# Patient Record
Sex: Female | Born: 1955
Health system: Southern US, Community
[De-identification: ages and names within clinical notes are randomized; demographics above are authoritative.]

## PROBLEM LIST (undated history)

## (undated) DIAGNOSIS — F32A Depression, unspecified: Secondary | ICD-10-CM

## (undated) DIAGNOSIS — K222 Esophageal obstruction: Secondary | ICD-10-CM

## (undated) DIAGNOSIS — I10 Essential (primary) hypertension: Secondary | ICD-10-CM

## (undated) DIAGNOSIS — F419 Anxiety disorder, unspecified: Secondary | ICD-10-CM

## (undated) DIAGNOSIS — D649 Anemia, unspecified: Secondary | ICD-10-CM

## (undated) DIAGNOSIS — I209 Angina pectoris, unspecified: Secondary | ICD-10-CM

## (undated) DIAGNOSIS — F329 Major depressive disorder, single episode, unspecified: Secondary | ICD-10-CM

## (undated) DIAGNOSIS — U071 COVID-19: Secondary | ICD-10-CM

## (undated) DIAGNOSIS — M797 Fibromyalgia: Secondary | ICD-10-CM

## (undated) DIAGNOSIS — I499 Cardiac arrhythmia, unspecified: Secondary | ICD-10-CM

## (undated) DIAGNOSIS — R1110 Vomiting, unspecified: Secondary | ICD-10-CM

## (undated) HISTORY — DX: Anxiety disorder, unspecified: F41.9

## (undated) HISTORY — DX: Essential (primary) hypertension: I10

## (undated) HISTORY — DX: Major depressive disorder, single episode, unspecified: F32.9

## (undated) HISTORY — DX: Esophageal obstruction: K22.2

## (undated) HISTORY — PX: ABDOMINAL ADHESION SURGERY: SHX90

## (undated) HISTORY — PX: BUNIONECTOMY: SHX129

## (undated) HISTORY — DX: Depression, unspecified: F32.A

## (undated) HISTORY — PX: GASTRIC BYPASS: SHX52

## (undated) HISTORY — PX: CERVICAL FUSION: SHX112

## (undated) HISTORY — DX: COVID-19: U07.1

---
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
## 2003-12-24 ENCOUNTER — Encounter: Payer: Self-pay | Admitting: Orthopaedic Surgery

## 2004-01-13 ENCOUNTER — Encounter: Payer: Self-pay | Admitting: Orthopaedic Surgery

## 2004-01-13 ENCOUNTER — Ambulatory Visit: Payer: Self-pay | Admitting: Orthopaedic Surgery

## 2004-02-03 ENCOUNTER — Other Ambulatory Visit: Payer: Self-pay

## 2004-02-10 ENCOUNTER — Ambulatory Visit: Payer: Self-pay | Admitting: Orthopaedic Surgery

## 2004-02-12 ENCOUNTER — Encounter: Payer: Self-pay | Admitting: Orthopaedic Surgery

## 2004-02-23 ENCOUNTER — Encounter: Payer: Self-pay | Admitting: Orthopaedic Surgery

## 2004-03-14 ENCOUNTER — Encounter: Payer: Self-pay | Admitting: Orthopaedic Surgery

## 2004-04-14 ENCOUNTER — Encounter: Payer: Self-pay | Admitting: Orthopaedic Surgery

## 2004-05-12 ENCOUNTER — Encounter: Payer: Self-pay | Admitting: Orthopaedic Surgery

## 2004-06-12 ENCOUNTER — Encounter: Payer: Self-pay | Admitting: Orthopaedic Surgery

## 2004-07-12 ENCOUNTER — Ambulatory Visit: Payer: Self-pay | Admitting: Orthopaedic Surgery

## 2005-01-05 ENCOUNTER — Encounter: Payer: Self-pay | Admitting: Orthopedic Surgery

## 2005-01-12 ENCOUNTER — Encounter: Payer: Self-pay | Admitting: Orthopedic Surgery

## 2005-02-11 ENCOUNTER — Encounter: Payer: Self-pay | Admitting: Orthopedic Surgery

## 2005-11-29 ENCOUNTER — Encounter: Payer: Self-pay | Admitting: Orthopedic Surgery

## 2005-12-12 ENCOUNTER — Encounter: Payer: Self-pay | Admitting: Orthopedic Surgery

## 2006-01-12 ENCOUNTER — Encounter: Payer: Self-pay | Admitting: Orthopedic Surgery

## 2006-02-06 ENCOUNTER — Other Ambulatory Visit: Payer: Self-pay

## 2006-02-06 ENCOUNTER — Emergency Department: Payer: Self-pay | Admitting: Emergency Medicine

## 2006-08-23 ENCOUNTER — Ambulatory Visit: Payer: Self-pay | Admitting: Unknown Physician Specialty

## 2008-01-13 LAB — HM MAMMOGRAPHY

## 2008-07-24 ENCOUNTER — Ambulatory Visit: Payer: Self-pay | Admitting: Unknown Physician Specialty

## 2008-08-15 ENCOUNTER — Ambulatory Visit: Payer: Self-pay | Admitting: Family

## 2009-04-05 ENCOUNTER — Ambulatory Visit: Payer: Self-pay | Admitting: Internal Medicine

## 2009-09-05 LAB — HM COLONOSCOPY: HM Colonoscopy: NORMAL

## 2009-12-22 ENCOUNTER — Observation Stay: Payer: Self-pay | Admitting: Internal Medicine

## 2009-12-25 ENCOUNTER — Emergency Department: Payer: Self-pay | Admitting: Internal Medicine

## 2010-03-10 ENCOUNTER — Ambulatory Visit: Payer: Self-pay | Admitting: Family Medicine

## 2010-03-14 ENCOUNTER — Ambulatory Visit: Payer: Self-pay | Admitting: Family Medicine

## 2010-08-12 ENCOUNTER — Ambulatory Visit: Payer: Self-pay | Admitting: Surgery

## 2010-08-28 ENCOUNTER — Ambulatory Visit: Payer: Self-pay | Admitting: Unknown Physician Specialty

## 2010-08-31 LAB — PATHOLOGY REPORT

## 2011-05-03 LAB — LIPID PANEL
Cholesterol: 171 mg/dL (ref 0–200)
HDL: 39 mg/dL (ref 35–70)
LDL Cholesterol: 118 mg/dL
Triglycerides: 71 mg/dL (ref 40–160)

## 2011-05-03 LAB — HEMOGLOBIN A1C: Hgb A1c MFr Bld: 6 % (ref 4.0–6.0)

## 2011-05-06 ENCOUNTER — Encounter: Payer: Self-pay | Admitting: Internal Medicine

## 2011-05-10 ENCOUNTER — Ambulatory Visit (INDEPENDENT_AMBULATORY_CARE_PROVIDER_SITE_OTHER): Payer: 59 | Admitting: Internal Medicine

## 2011-05-10 ENCOUNTER — Encounter: Payer: Self-pay | Admitting: Internal Medicine

## 2011-05-10 DIAGNOSIS — Z9884 Bariatric surgery status: Secondary | ICD-10-CM

## 2011-05-10 DIAGNOSIS — E538 Deficiency of other specified B group vitamins: Secondary | ICD-10-CM

## 2011-05-10 DIAGNOSIS — I10 Essential (primary) hypertension: Secondary | ICD-10-CM

## 2011-05-10 DIAGNOSIS — R7303 Prediabetes: Secondary | ICD-10-CM | POA: Insufficient documentation

## 2011-05-10 DIAGNOSIS — E119 Type 2 diabetes mellitus without complications: Secondary | ICD-10-CM

## 2011-05-10 DIAGNOSIS — E559 Vitamin D deficiency, unspecified: Secondary | ICD-10-CM

## 2011-05-10 MED ORDER — TRAMADOL HCL 50 MG PO TABS
50.0000 mg | ORAL_TABLET | Freq: Four times a day (QID) | ORAL | Status: DC | PRN
Start: 2011-05-10 — End: 2012-07-10

## 2011-05-10 MED ORDER — ONDANSETRON HCL 4 MG PO TABS: 4.0000 mg | ORAL_TABLET | Freq: Three times a day (TID) | ORAL | Status: AC | PRN

## 2011-05-10 NOTE — Assessment & Plan Note (Addendum)
She has required fewer medications for management of hypertension since her bypass. She was previously taking his hydrochlorothiazide lisinopril metoprolol but was found to be quite bradycardic on her previous dose of metoprolol. For the last several days she has been only on reduced dose of metoprolol. The hydrochlorothiazide was stopped due to her propensity for dehydration due to gastric bypass. Since she does have a history of diabetes we discussed the possibility of resuming lisinopril stop the Toprol depending on whether she had mild microalbuminuria.

## 2011-05-10 NOTE — Assessment & Plan Note (Signed)
We spent 45 minutes today discussing her her recent surgery and her dietary needs. I have made recommendations regarding several protein rich foods that will also be low in carbohydrate and therefore not aggravate her abdominal distention and nausea. Also had some blood work done today to look for B12 and vitamin D deficiency as this often happens after gastric bypass.

## 2011-05-10 NOTE — Patient Instructions (Addendum)
Your diabetes is well controlled without medications.  We will repeat your cholesterol in 3 months and decide then if you still need cholesterol medication.  Continue metoprolol for now for blood pressure    You need to have ready access to foods that are protein rich,  Low carb, such as :  Atkins snack size chocolate lovers variety protein bars:  130 to 160 cla   6 carbs  12 g protein  Lots of fiber!  sargento cheese sticks   1 ounce,  100 cal  No carbs  All protein /fat   EAS Carb control   Protein shake  100 cal  2.5 carbs  17 g proteins 11 ounces

## 2011-05-10 NOTE — Progress Notes (Signed)
Subjective:    Patient ID: Francetta Found, female    DOB: 02/06/1956, 56 y.o.   MRN: [id number]  HPI Ms. Koors is a 56 year old white female with a history of asthma and obesity who presents for followup. She was last seen prior to my move in August. She recently underwent gastric bypass in dec by Dr. Alva Garnet. Prior to bypass her peak weight was 247 pounds and she is dropped 50 pounds thus far .  She has been plagued by recurrent  nausea and vomiting  of unclear etiology but does note that she has been unable to comply with instructions to avoid drinking water during her small meals. She had been using Zofran prescribed by Dr. Alva Garnet for management of nausea but recently had changed to a transdermal patch which caused extensive nausea and vomiting which lasted approximately 48 hours. She did not notify Dr. Alva Garnet of this reaction.  Additionally she has had trouble complying with them we'll schedule due to her symptoms of a full-time position she accepted to a full-time position with with the company she was hoping to get part-time work from.  She she admits to being  overwhelmed by the prospect of balancing her dietary needs with a full-time job. She has resorted to eating crackers at times not following her diet.   Past Medical History  Diagnosis Date  . Asthma   . Schatzki's ring     dialated 3 times by [facility]    Current Outpatient Prescriptions on File Prior to Visit  Medication Sig Dispense Refill  . sertraline (ZOLOFT) 100 MG tablet Take 100 mg by mouth daily.       Review of Systems  Constitutional: Negative for fever, chills and unexpected weight change.  HENT: Negative for hearing loss, ear pain, nosebleeds, congestion, sore throat, facial swelling, rhinorrhea, sneezing, mouth sores, trouble swallowing, neck pain, neck stiffness, voice change, postnasal drip, sinus pressure, tinnitus and ear discharge.   Eyes: Negative for pain, discharge, redness and visual disturbance.  Respiratory:  Negative for cough, chest tightness, shortness of breath, wheezing and stridor.   Cardiovascular: Negative for chest pain, palpitations and leg swelling.  Gastrointestinal: Positive for nausea and vomiting.  Musculoskeletal: Negative for myalgias and arthralgias.  Skin: Negative for color change and rash.  Neurological: Negative for dizziness, weakness, light-headedness and headaches.  Hematological: Negative for adenopathy.       Objective:   Physical Exam  Constitutional: She is oriented to person, place, and time. She appears well-developed and well-nourished.  HENT:  Mouth/Throat: Oropharynx is clear and moist.  Eyes: EOM are normal. Pupils are equal, round, and reactive to light. No scleral icterus.  Neck: Normal range of motion. Neck supple. No JVD present. No thyromegaly present.  Cardiovascular: Normal rate, regular rhythm, normal heart sounds and intact distal pulses.   Pulmonary/Chest: Effort normal and breath sounds normal.  Abdominal: Soft. Bowel sounds are normal. She exhibits no mass. There is no tenderness.  Musculoskeletal: Normal range of motion. She exhibits no edema.  Lymphadenopathy:    She has no cervical adenopathy.  Neurological: She is alert and oriented to person, place, and time.  Skin: Skin is warm and dry.  Psychiatric: She has a normal mood and affect.      Assessment & Plan:   Hypertension She has required fewer medications for management of hypertension since her bypass. She was previously taking his hydrochlorothiazide lisinopril metoprolol but was found to be quite bradycardic on her previous dose of metoprolol. For the last several  days she has been only on reduced dose of metoprolol. The hydrochlorothiazide was stopped due to her propensity for dehydration due to gastric bypass. Since she does have a history of diabetes we discussed the possibility of resuming lisinopril stop the Toprol depending on whether she had mild microalbuminuria.   Status  post gastric bypass for obesity We spent 45 minutes today discussing her her recent surgery and her dietary needs. I have made recommendations regarding several protein rich foods that will also be low in carbohydrate and therefore not aggravate her abdominal distention and nausea. Also had some blood work done today to look for B12 and vitamin D deficiency as this often happens after gastric bypass.  Diabetes mellitus, controlled Her hemoglobin A1c is now 6.0 on diet alone.    Updated Medication List Outpatient Encounter Prescriptions as of 05/10/2011  Medication Sig Dispense Refill  . calcium carbonate (OS-CAL) 600 MG TABS Take 600 mg by mouth 2 (two) times daily with a meal.      . lisinopril (PRINIVIL,ZESTRIL) 10 MG tablet Take 10 mg by mouth daily.      . metFORMIN (GLUCOPHAGE) 500 MG tablet Take 500 mg by mouth daily with breakfast.      . metoprolol succinate (TOPROL-XL) 50 MG 24 hr tablet Take 50 mg by mouth daily. Take with or immediately following a meal.      . montelukast (SINGULAIR) 10 MG tablet Take 10 mg by mouth at bedtime.      . Multiple Vitamin (MULTIVITAMIN) tablet Take 1 tablet by mouth daily. Bariatric Vitamin      . sertraline (ZOLOFT) 100 MG tablet Take 100 mg by mouth daily.      . traMADol (ULTRAM) 50 MG tablet Take 1 tablet (50 mg total) by mouth every 6 (six) hours as needed for pain.  90 tablet  3  . DISCONTD: traMADol (ULTRAM) 50 MG tablet Take 50 mg by mouth 2 (two) times daily.      . ondansetron (ZOFRAN) 4 MG tablet Take 1 tablet (4 mg total) by mouth every 8 (eight) hours as needed for nausea.  60 tablet  3  . DISCONTD: furosemide (LASIX) 40 MG tablet Take 40 mg by mouth daily.      Marland Kitchen DISCONTD: lansoprazole (PREVACID) 30 MG capsule Take 30 mg by mouth daily.      Marland Kitchen DISCONTD: metoprolol (LOPRESSOR) 50 MG tablet Take 50 mg by mouth 2 (two) times daily.      Marland Kitchen DISCONTD: verapamil (CALAN-SR) 240 MG CR tablet Take 240 mg by mouth daily.

## 2011-05-10 NOTE — Assessment & Plan Note (Signed)
Her hemoglobin A1c is now 6.0 on diet alone.

## 2011-05-11 LAB — VITAMIN B12: Vitamin B-12: >1500 pg/mL — ABNORMAL HIGH (ref 211–911)

## 2011-05-11 LAB — CBC WITH DIFFERENTIAL/PLATELET
Basophils Absolute: 0 10*3/uL (ref 0.0–0.1)
Basophils Relative: 0.6 % (ref 0.0–3.0)
Eosinophils Absolute: 0.2 10*3/uL (ref 0.0–0.7)
Eosinophils Relative: 4.4 % (ref 0.0–5.0)
HCT: 38.2 % (ref 36.0–46.0)
Hemoglobin: 12.3 g/dL (ref 12.0–15.0)
Lymphocytes Relative: 41.4 % (ref 12.0–46.0)
Lymphs Abs: 2.2 10*3/uL (ref 0.7–4.0)
MCHC: 32.1 g/dL (ref 30.0–36.0)
MCV: 83.8 fl (ref 78.0–100.0)
Monocytes Absolute: 0.2 10*3/uL (ref 0.1–1.0)
Monocytes Relative: 4.7 % (ref 3.0–12.0)
Neutro Abs: 2.6 10*3/uL (ref 1.4–7.7)
Neutrophils Relative %: 48.9 % (ref 43.0–77.0)
Platelets: 170 10*3/uL (ref 150.0–400.0)
RBC: 4.56 Mil/uL (ref 3.87–5.11)
RDW: 15.6 % — ABNORMAL HIGH (ref 11.5–14.6)
WBC: 5.3 10*3/uL (ref 4.5–10.5)

## 2011-05-11 LAB — MICROALBUMIN / CREATININE URINE RATIO
Creatinine,U: 267.2 mg/dL
Microalb Creat Ratio: 0.4 mg/g (ref 0.0–30.0)
Microalb, Ur: 1.1 mg/dL (ref 0.0–1.9)

## 2011-05-11 LAB — VITAMIN D 25 HYDROXY (VIT D DEFICIENCY, FRACTURES): Vit D, 25-Hydroxy: 50 ng/mL (ref 30–89)

## 2011-05-16 ENCOUNTER — Ambulatory Visit: Payer: Self-pay | Admitting: Internal Medicine

## 2011-06-03 ENCOUNTER — Encounter: Payer: Self-pay | Admitting: Internal Medicine

## 2011-07-22 ENCOUNTER — Ambulatory Visit: Payer: Self-pay | Admitting: Bariatrics

## 2011-07-25 ENCOUNTER — Ambulatory Visit: Payer: Self-pay | Admitting: Bariatrics

## 2011-07-29 ENCOUNTER — Other Ambulatory Visit: Payer: Self-pay | Admitting: Internal Medicine

## 2011-07-29 MED ORDER — METOPROLOL SUCCINATE ER 50 MG PO TB24: 50.0000 mg | ORAL_TABLET | Freq: Every day | ORAL | Status: DC

## 2011-08-13 HISTORY — PX: CHOLECYSTECTOMY: SHX55

## 2011-09-06 ENCOUNTER — Encounter: Payer: Self-pay | Admitting: Internal Medicine

## 2011-09-06 ENCOUNTER — Ambulatory Visit (INDEPENDENT_AMBULATORY_CARE_PROVIDER_SITE_OTHER): Payer: 59 | Admitting: Internal Medicine

## 2011-09-06 VITALS — BP 122/68 | HR 64 | Temp 98.4°F | Resp 16 | Wt 167.5 lb

## 2011-09-06 DIAGNOSIS — K828 Other specified diseases of gallbladder: Secondary | ICD-10-CM

## 2011-09-06 DIAGNOSIS — Z9884 Bariatric surgery status: Secondary | ICD-10-CM

## 2011-09-06 DIAGNOSIS — E119 Type 2 diabetes mellitus without complications: Secondary | ICD-10-CM

## 2011-09-06 NOTE — Progress Notes (Signed)
Patient ID: Sherri Lee, female   DOB: 1955-04-23, 56 y.o.   MRN: [id number]  Patient Active Problem List  Diagnosis  . Hypertension  . Diabetes mellitus, controlled  . Status post gastric bypass for obesity  . Biliary dyskinesia    Subjective:  CC:   Chief Complaint  Patient presents with  . Advice Only    upcoming surgery, wants opinion    HPI:   Sherri Briggsis a 56 y.o. female who presents for 4 month follow up. Has lost 30 lbs since February , now 75 lbs lighter. Tyner did the  Rou en Y in Dec at [facility]  Had a surgery in April at [facility] following an endoscopy which showed bile in the stomach,  To relieve adhesions (?) Still having a lot of nausea and now having RUQ pain radiating to back since December .  Talked to bariatric surgeon , who did an ultrasound was negative for gallstones.  HIDA scan was overactive and is suggesting a cholecystectomy dure to biliary dyskinesia.  The pain is a dull ache , aggravated by the end of the day .  Persistent bloating and nausea.  Has been constipated since the surgery.  Drinking protein drinks,  decaff green tea, and yogurt.  Gets nauseated after a few  Bites .  Has resumed Jinteli , oral HRT ffor severe hot flashes,  By GYN.  Her PAP scheduled for July    Past Medical History  Diagnosis Date  . Asthma   . Schatzki's ring     dialated 3 times by Markham Jordan     Past Surgical History  Procedure Date  . Bunionectomy     left foot   . Cesarean section          The following portions of the patient's history were reviewed and updated as appropriate: Allergies, current medications, and problem list.    Review of Systems:   12 Pt  review of systems was negative except those addressed in the HPI,     History   Social History  . Marital Status: Married    Spouse Name: N/A    Number of Children: N/A  . Years of Education: N/A   Occupational History  . Not on file.   Social History Main Topics  . Smoking  status: Never Smoker   . Smokeless tobacco: Never Used  . Alcohol Use: No  . Drug Use: No  . Sexually Active: Not on file   Other Topics Concern  . Not on file   Social History Narrative   Lives with spouse, son, and stepson who has serious psychiatric issues and alcohol abuse    Objective:  BP 122/68  Pulse 64  Temp 98.4 F (36.9 C) (Oral)  Resp 16  Wt 167 lb 8 oz (75.978 kg)  SpO2 97%  General appearance: alert, cooperative and appears stated age Ears: normal TM's and external ear canals both ears Throat: lips, mucosa, and tongue normal; teeth and gums normal Neck: no adenopathy, no carotid bruit, supple, symmetrical, trachea midline and thyroid not enlarged, symmetric, no tenderness/mass/nodules Back: symmetric, no curvature. ROM normal. No CVA tenderness. Lungs: clear to auscultation bilaterally Heart: regular rate and rhythm, S1, S2 normal, no murmur, click, rub or gallop Abdomen: soft, non-tender; bowel sounds normal; no masses,  no organomegaly Pulses: 2+ and symmetric Skin: Skin color, texture, turgor normal. No rashes or lesions Lymph nodes: Cervical, supraclavicular, and axillary nodes normal.  Assessment and Plan:  Status post gastric bypass  for obesity She has lost 75 lb thus far and has not plateaued yet due to persistent nausea ad early satiety  Biliary dyskinesia She is undergoing cholecystectomy this week for persistent nausea due to biliary dyskinesia.    Diabetes mellitus, controlled Now diet controlled since she has lost weight    Updated Medication List Outpatient Encounter Prescriptions as of 09/06/2011  Medication Sig Dispense Refill  . calcium carbonate (OS-CAL) 600 MG TABS Take 600 mg by mouth 2 (two) times daily with a meal.      . metoprolol succinate (TOPROL-XL) 50 MG 24 hr tablet Take 1 tablet (50 mg total) by mouth daily. Take with or immediately following a meal.  30 tablet  3  . montelukast (SINGULAIR) 10 MG tablet Take 10 mg by mouth  at bedtime.      . Multiple Vitamin (MULTIVITAMIN) tablet Take 1 tablet by mouth daily. Bariatric Vitamin      . sertraline (ZOLOFT) 100 MG tablet Take 100 mg by mouth daily.      . traMADol (ULTRAM) 50 MG tablet Take 1 tablet (50 mg total) by mouth every 6 (six) hours as needed for pain.  90 tablet  3  . DISCONTD: lisinopril (PRINIVIL,ZESTRIL) 10 MG tablet Take 10 mg by mouth daily.      Marland Kitchen DISCONTD: metFORMIN (GLUCOPHAGE) 500 MG tablet Take 500 mg by mouth daily with breakfast.         Orders Placed This Encounter  Procedures  . HM COLONOSCOPY    No Follow-up on file.

## 2011-09-08 ENCOUNTER — Encounter: Payer: Self-pay | Admitting: Internal Medicine

## 2011-09-08 DIAGNOSIS — K828 Other specified diseases of gallbladder: Secondary | ICD-10-CM | POA: Insufficient documentation

## 2011-09-08 NOTE — Assessment & Plan Note (Signed)
Now diet controlled since she has lost weight

## 2011-09-08 NOTE — Assessment & Plan Note (Signed)
She is undergoing cholecystectomy this week for persistent nausea due to biliary dyskinesia.

## 2011-09-08 NOTE — Assessment & Plan Note (Signed)
She has lost 75 lb thus far and has not plateaued yet due to persistent nausea ad early satiety

## 2011-09-27 ENCOUNTER — Telehealth: Payer: Self-pay | Admitting: Internal Medicine

## 2011-09-27 NOTE — Telephone Encounter (Signed)
Patient stopped by this afternoon to let us know that she is still having pain in her right shoulder blade area.  She states that her gallbladder was removed about 3 weeks ago.  She states that you have prescribed her tramadol in the past however that isn't helping the pain.  She wanted to know if she could take a higher dose of it or can you suggest something else for her to try.  She states that she is off the pain medication that the surgeon gave her for gallbladder surgery.

## 2011-09-28 MED ORDER — HYDROCODONE-ACETAMINOPHEN 5-500 MG PO TABS: 1.0000 | ORAL_TABLET | Freq: Four times a day (QID) | ORAL | Status: AC | PRN

## 2011-09-28 NOTE — Telephone Encounter (Signed)
If she still has the narcotics the surgeon gave her I would use that.  We need to figure out what her pain is coming from as well as treat it.  If she does not have any left,  Call in vicodin 7/5/500 on etablet every 6 hours prn pain #60 and have her make another appt with either him or me to work up the shoulder blade pain,

## 2011-09-28 NOTE — Telephone Encounter (Signed)
Patient notified. Rx called in. Appt made.

## 2011-10-04 ENCOUNTER — Ambulatory Visit: Payer: 59 | Admitting: Internal Medicine

## 2011-10-04 ENCOUNTER — Ambulatory Visit (INDEPENDENT_AMBULATORY_CARE_PROVIDER_SITE_OTHER): Payer: 59 | Admitting: Internal Medicine

## 2011-10-04 ENCOUNTER — Encounter: Payer: Self-pay | Admitting: Internal Medicine

## 2011-10-04 VITALS — BP 146/80 | HR 66 | Temp 98.1°F | Resp 16 | Wt 160.5 lb

## 2011-10-04 DIAGNOSIS — J019 Acute sinusitis, unspecified: Secondary | ICD-10-CM | POA: Insufficient documentation

## 2011-10-04 DIAGNOSIS — R071 Chest pain on breathing: Secondary | ICD-10-CM

## 2011-10-04 DIAGNOSIS — R0789 Other chest pain: Secondary | ICD-10-CM

## 2011-10-04 DIAGNOSIS — M546 Pain in thoracic spine: Secondary | ICD-10-CM

## 2011-10-04 DIAGNOSIS — M47814 Spondylosis without myelopathy or radiculopathy, thoracic region: Secondary | ICD-10-CM | POA: Insufficient documentation

## 2011-10-04 DIAGNOSIS — Z9884 Bariatric surgery status: Secondary | ICD-10-CM

## 2011-10-04 DIAGNOSIS — K828 Other specified diseases of gallbladder: Secondary | ICD-10-CM

## 2011-10-04 MED ORDER — HYDROCOD POLST-CHLORPHEN POLST 10-8 MG/5ML PO LQCR: 5.0000 mL | Freq: Two times a day (BID) | ORAL | Status: DC | PRN

## 2011-10-04 MED ORDER — AMOXICILLIN-POT CLAVULANATE 875-125 MG PO TABS: 1.0000 | ORAL_TABLET | Freq: Two times a day (BID) | ORAL | Status: AC

## 2011-10-04 NOTE — Assessment & Plan Note (Signed)
She continues to lose weight steadily after gastric bypass. Her BMI is now 26. Recent electrolytes and other labs from Dr. Alva Garnet were normal.

## 2011-10-04 NOTE — Assessment & Plan Note (Signed)
With chronic nausea, now relieved status post cholecystectomy.

## 2011-10-04 NOTE — Assessment & Plan Note (Signed)
Given chronicity of symptoms, development of facial pain and exam consistent with bacterial URI,  Will treat with empiric antibiotics, decongestants, and saline lavage.   

## 2011-10-04 NOTE — Assessment & Plan Note (Signed)
The etiology of her pain may be due to degenerative thoracic spine disease, given her history of cervical spine disease requiring surgery. Muscle concerned about an occult tumor given the persistent pain and worsening over his last several months. I ordered thoracic spine films and a plain chest x-ray for initial evaluation. She will likely need an MRI for definitive evaluation.

## 2011-10-04 NOTE — Patient Instructions (Addendum)
You have a sinus/ear infection   .  I am prescribing an antibiotic augmentin (take with food, and eat yogurt).   I also advise use of the following OTC meds to help with your other symptoms.   Take generic OTC benadryl 25 mg every 8 hours for the drainage,  Sudafed PE  10 to 30 mg every 8 hours for the congestion, you may substitute Afrin nasal spray for the nighttime dose of sudafed PE  If needed to prevent insomnia.  flushes your sinuses twice daily with Simply Saline (do over the sink because if you do it right you will spit out globs of mucus)  Tussionex for cough/pain (Narcotic)  Gargle with salt water as needed for sore throat.   X rays when you feel better

## 2011-10-04 NOTE — Progress Notes (Signed)
Patient ID: Francetta Found, female   DOB: [birth year], 56 y.o.   MRN: [id number]   Patient Active Problem List  Diagnosis  . Hypertension  . Diabetes mellitus, controlled  . Status post gastric bypass for obesity  . Biliary dyskinesia  . Thoracic spine pain  . Sinusitis acute    Subjective:  CC:   Chief Complaint  Patient presents with  . Sinusitis  . Headache    HPI:   [name] Briggsis a 56 y.o. female who presents with  1) symptoms of sinus pain ear pain productive cough and headache for the past 5 days .  Has been using over-the-counter NyQuil with no relief. Feels absolutely lousy. No significant improvement in 5 days.  2)  Persistent  right shoulder blade pain  Which radiates to  mid axillary line and has been present  for over 6 months.  The pain is daily, and he worsens by the end of the day.  It did not resolve with recent cholecystectomy. It predates her gastric bypass.  It is not relieved with ibuprofen Tylenol or tramadol. It is aggravated with upright position and only slightly improved when she  lies flat and presses her  back to the mattress or  floor.  She has a history of cervical and lumbar DDD with prior surgery on cervical spine.  Her lumbar back pain improved when she lost a significant amount of weight.  ,   Past Medical History  Diagnosis Date  . Asthma   . Schatzki's ring     dialated 3 times by Markham Jordan     Past Surgical History  Procedure Date  . Bunionectomy     left foot   . Cesarean section   . Cholecystectomy June 2013         The following portions of the patient's history were reviewed and updated as appropriate: Allergies, current medications, and problem list.    Review of Systems:   12 Pt  review of systems was negative except those addressed in the HPI,     History   Social History  . Marital Status: Married    Spouse Name: N/A    Number of Children: N/A  . Years of Education: N/A   Occupational History  . Not on file.     Social History Main Topics  . Smoking status: Never Smoker   . Smokeless tobacco: Never Used  . Alcohol Use: No  . Drug Use: No  . Sexually Active: Not on file   Other Topics Concern  . Not on file   Social History Narrative   Lives with spouse, son, and stepson who has serious psychiatric issues and alcohol abuse    Objective:  BP 146/80  Pulse 66  Temp 98.1 F (36.7 C) (Oral)  Resp 16  Wt 160 lb 8 oz (72.802 kg)  SpO2 97%  General appearance: alert, cooperative and appears stated age Ears: bilateral TM injection with effusions Throat: lips, mucosa, and tongue normal; teeth and gums normal. Tonsillar erythema without edema Neck: no adenopathy, no carotid bruit, supple, symmetrical, trachea midline and thyroid not enlarged, symmetric, no tenderness/mass/nodules Back: symmetric, no curvature. ROM normal. Tender to palpation right scapular region. Lungs: clear to auscultation bilaterally Heart: regular rate and rhythm, S1, S2 normal, no murmur, click, rub or gallop Abdomen: soft, non-tender; bowel sounds normal; no masses,  no organomegaly Pulses: 2+ and symmetric Skin: Skin color, texture, turgor normal. No rashes or lesions Lymph nodes: Cervical, supraclavicular, and axillary nodes normal.  Assessment and Plan:  Sinusitis acute Given chronicity of symptoms, development of facial pain and exam consistent with bacterial URI,  Will treat with empiric antibiotics, decongestants, and saline lavage.    Thoracic spine pain The etiology of her pain may be due to degenerative thoracic spine disease, given her history of cervical spine disease requiring surgery. Muscle concerned about an occult tumor given the persistent pain and worsening over his last several months. I ordered thoracic spine films and a plain chest x-ray for initial evaluation. She will likely need an MRI for definitive evaluation.  Status post gastric bypass for obesity She continues to lose weight steadily  after gastric bypass. Her BMI is now 26. Recent electrolytes and other labs from Dr. Alva Garnet were normal.  Biliary dyskinesia With chronic nausea, now relieved status post cholecystectomy.   Updated Medication List Outpatient Encounter Prescriptions as of 10/04/2011  Medication Sig Dispense Refill  . calcium carbonate (OS-CAL) 600 MG TABS Take 600 mg by mouth 2 (two) times daily with a meal.      . estradiol-norethindrone (COMBIPATCH) 0.05-0.14 MG/DAY Place 1 patch onto the skin 2 (two) times a week.      Marland Kitchen HYDROcodone-acetaminophen (VICODIN) 5-500 MG per tablet Take 1 tablet by mouth every 6 (six) hours as needed for pain.  60 tablet  0  . metoprolol succinate (TOPROL-XL) 50 MG 24 hr tablet Take 1 tablet (50 mg total) by mouth daily. Take with or immediately following a meal.  30 tablet  3  . montelukast (SINGULAIR) 10 MG tablet Take 10 mg by mouth at bedtime.      . Multiple Vitamin (MULTIVITAMIN) tablet Take 1 tablet by mouth daily. Bariatric Vitamin      . sertraline (ZOLOFT) 100 MG tablet Take 100 mg by mouth daily.      . traMADol (ULTRAM) 50 MG tablet Take 1 tablet (50 mg total) by mouth every 6 (six) hours as needed for pain.  90 tablet  3  . amoxicillin-clavulanate (AUGMENTIN) 875-125 MG per tablet Take 1 tablet by mouth 2 (two) times daily.  14 tablet  0  . chlorpheniramine-HYDROcodone (TUSSIONEX) 10-8 MG/5ML LQCR Take 5 mLs by mouth every 12 (twelve) hours as needed.  240 mL  1     Orders Placed This Encounter  Procedures  . DG Thoracic Spine W/Swimmers  . DG Chest 2 View    No Follow-up on file.

## 2011-10-04 NOTE — Progress Notes (Signed)
Rx for Tussionex faxed to [facility].

## 2011-10-14 ENCOUNTER — Ambulatory Visit (INDEPENDENT_AMBULATORY_CARE_PROVIDER_SITE_OTHER)
Admission: RE | Admit: 2011-10-14 | Discharge: 2011-10-14 | Disposition: A | Discharge status: Home / Self Care | Payer: 59 | Source: Ambulatory Visit | Attending: Internal Medicine | Admitting: Internal Medicine

## 2011-10-14 DIAGNOSIS — M546 Pain in thoracic spine: Secondary | ICD-10-CM

## 2011-10-14 DIAGNOSIS — R071 Chest pain on breathing: Secondary | ICD-10-CM

## 2011-10-14 DIAGNOSIS — R0789 Other chest pain: Secondary | ICD-10-CM

## 2011-10-19 ENCOUNTER — Telehealth: Payer: Self-pay | Admitting: Internal Medicine

## 2011-10-19 DIAGNOSIS — M546 Pain in thoracic spine: Secondary | ICD-10-CM

## 2011-10-19 NOTE — Telephone Encounter (Signed)
Patient notified that she will hear from referral coordinator with appt

## 2011-10-19 NOTE — Telephone Encounter (Signed)
Patient called back about her spine films and wants to proceed with physical therapy before getting an MRI.  Please advise.

## 2011-10-19 NOTE — Telephone Encounter (Signed)
PT referral made in epic

## 2011-11-08 ENCOUNTER — Encounter: Payer: Self-pay | Admitting: Internal Medicine

## 2011-11-13 ENCOUNTER — Encounter: Payer: Self-pay | Admitting: Internal Medicine

## 2011-11-21 ENCOUNTER — Other Ambulatory Visit: Payer: Self-pay | Admitting: Internal Medicine

## 2011-11-21 MED ORDER — METOPROLOL SUCCINATE ER 50 MG PO TB24: 50.0000 mg | ORAL_TABLET | Freq: Every day | ORAL | Status: DC

## 2011-11-28 ENCOUNTER — Ambulatory Visit: Payer: Self-pay | Admitting: Bariatrics

## 2011-12-05 ENCOUNTER — Telehealth: Payer: Self-pay | Admitting: Internal Medicine

## 2011-12-05 MED ORDER — HYDROCODONE-ACETAMINOPHEN 5-500 MG PO TABS: 1.0000 | ORAL_TABLET | Freq: Four times a day (QID) | ORAL | Status: DC | PRN

## 2011-12-05 NOTE — Telephone Encounter (Signed)
Ok to refill  Vicodin quantity #60 with no refills. A prescription has been printed

## 2011-12-05 NOTE — Telephone Encounter (Signed)
OK to refill vicodin 5-500 1 Q6 PRN? Last filled 09/28/11.

## 2011-12-05 NOTE — Telephone Encounter (Signed)
Patient needing a refill on her hydrocodone acetaminophen 5-500 mg.

## 2011-12-06 ENCOUNTER — Other Ambulatory Visit: Payer: Self-pay | Admitting: Bariatrics

## 2011-12-06 LAB — CLOSTRIDIUM DIFFICILE BY PCR

## 2011-12-06 NOTE — Telephone Encounter (Signed)
[facility] called [facility] to [facility]. 

## 2011-12-13 ENCOUNTER — Encounter: Payer: Self-pay | Admitting: Internal Medicine

## 2012-01-09 ENCOUNTER — Other Ambulatory Visit: Payer: Self-pay

## 2012-01-09 MED ORDER — HYDROCODONE-ACETAMINOPHEN 5-500 MG PO TABS: 1.0000 | ORAL_TABLET | Freq: Four times a day (QID) | ORAL | Status: DC | PRN

## 2012-01-09 NOTE — Telephone Encounter (Signed)
Patient states that she is going to physical therapy she is currently having constant pain due to her work and she wants to know if she can get a refill on her pain medication.

## 2012-01-10 ENCOUNTER — Other Ambulatory Visit: Payer: Self-pay

## 2012-01-13 ENCOUNTER — Encounter: Payer: Self-pay | Admitting: Internal Medicine

## 2012-04-16 ENCOUNTER — Other Ambulatory Visit: Payer: Self-pay | Admitting: Internal Medicine

## 2012-04-28 ENCOUNTER — Other Ambulatory Visit: Payer: Self-pay | Admitting: Internal Medicine

## 2012-04-28 NOTE — Telephone Encounter (Signed)
Ok to refill her vicodin for #90 no refills

## 2012-04-30 NOTE — Telephone Encounter (Signed)
Med phoned in °

## 2012-06-21 ENCOUNTER — Telehealth: Payer: Self-pay | Admitting: Internal Medicine

## 2012-06-21 NOTE — Telephone Encounter (Signed)
Pt states she left a vm a few days ago and had not heard back.  Pt states she is needing some Rx:  metoprolol has one refill left. States Dr. Darrick Huntsman had given her some pain medicine.  Goes to [facility] and is going to PT again.  Dr. There put her on Tramadol but it does not work as well as hydrocodone that Dr. Darrick Huntsman had given her before.  Wants Dr. Darrick Huntsman to call her in an Rx for the hydrocodone if possible.  Pt is starting new job on Monday and will not be able to come in after next week for a while.

## 2012-06-22 MED ORDER — METOPROLOL SUCCINATE ER 50 MG PO TB24: 50.0000 mg | ORAL_TABLET | Freq: Every day | ORAL | Status: DC

## 2012-06-22 NOTE — Telephone Encounter (Signed)
Script sent to pharmacy and patient notified as instructed by phone.

## 2012-06-22 NOTE — Telephone Encounter (Signed)
Patient not seen since 7/13 no labs since 6/13 states has one refill on metoprolol and requesting a pain medication. Please advise.

## 2012-06-22 NOTE — Telephone Encounter (Signed)
Ok to refill metoprolol for 30 days but must be seen for any refills on narcotics.

## 2012-07-10 ENCOUNTER — Ambulatory Visit (INDEPENDENT_AMBULATORY_CARE_PROVIDER_SITE_OTHER): Payer: 59 | Admitting: Internal Medicine

## 2012-07-10 ENCOUNTER — Encounter: Payer: Self-pay | Admitting: Internal Medicine

## 2012-07-10 VITALS — BP 120/78 | HR 76 | Temp 97.7°F | Resp 16 | Wt 147.5 lb

## 2012-07-10 DIAGNOSIS — M542 Cervicalgia: Secondary | ICD-10-CM

## 2012-07-10 DIAGNOSIS — I10 Essential (primary) hypertension: Secondary | ICD-10-CM

## 2012-07-10 DIAGNOSIS — L639 Alopecia areata, unspecified: Secondary | ICD-10-CM

## 2012-07-10 MED ORDER — SPIRONOLACTONE 12.5 MG HALF TABLET: 12.5000 mg | ORAL_TABLET | Freq: Every day | ORAL | Status: DC

## 2012-07-10 MED ORDER — GABAPENTIN 100 MG PO CAPS: 100.0000 mg | ORAL_CAPSULE | Freq: Three times a day (TID) | ORAL | Status: DC

## 2012-07-10 MED ORDER — HYDROCODONE-ACETAMINOPHEN 5-500 MG PO TABS: ORAL_TABLET | ORAL | Status: DC

## 2012-07-10 MED ORDER — TRAMADOL HCL 50 MG PO TABS: 50.0000 mg | ORAL_TABLET | Freq: Four times a day (QID) | ORAL | Status: DC | PRN

## 2012-07-10 NOTE — Patient Instructions (Addendum)
E will request records from [facility] and dr Macie Burows at [facility]   [facility]  use tramadol up to 4 daily,  civodin up to 2 daily,  Gabapentin up to 3 daily (or all at night)   tiral of spironolactone 12. 5 mg daily to suppress androgens (testosterone) to help your hair loss

## 2012-07-10 NOTE — Progress Notes (Signed)
Patient ID: Sherri Lee, female   DOB: [birth year], 57 y.o.   MRN: [id number]   Patient Active Problem List   Diagnosis Date Noted  . Alopecia areata 07/11/2012  . Cervicalgia 07/11/2012  . Thoracic spine pain 10/04/2011  . Sinusitis acute 10/04/2011  . Biliary dyskinesia 09/08/2011  . Hypertension 05/10/2011  . Diabetes mellitus, controlled 05/10/2011  . Status post gastric bypass for obesity 05/10/2011    Subjective:  CC:   Chief Complaint  Patient presents with  . Follow-up    HPI:   Sherri Briggsis a 57 y.o. female who presents 1) history of bariatric surgery.   Surgery was 1.5 years ago and she has been maintaining her weight;   labs done 6 weeks ago at the [facility]  (told it  was perfect). But has had significant hair loss. Started taking folic acid and biotin  In December .  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  She has been wearing wigs.because of the amount of hair lost.  Stressors:  2 sons who are having difficulty  Both financial and otherwise,    2) still getting PT at [facility].  For thoracic back pain.  Saw [place] at [facility] and referred to spine specialist.  History of rotator cuff surgery on the left and 3 disc fusion  And she is now having weakness and tremor of the left hand .  Was given tramadol which did not work  Wants to return to gabapentin    Past Medical History  Diagnosis Date  . Asthma   . Schatzki's ring     dialated 3 times by Markham Jordan     Past Surgical History  Procedure Laterality Date  . Bunionectomy      left foot   . Cesarean section    . Cholecystectomy  June 2013       The following portions of the patient's history were reviewed and updated as appropriate: Allergies, current medications, and problem list.    Review of Systems:   12 Pt  review of systems was negative except those addressed in the HPI,     History   Social History  . Marital Status: Married    Spouse Name: N/A    Number of Children: N/A  . Years of  Education: N/A   Occupational History  . Not on file.   Social History Main Topics  . Smoking status: Never Smoker   . Smokeless tobacco: Never Used  . Alcohol Use: No  . Drug Use: No  . Sexually Active: Not on file   Other Topics Concern  . Not on file   Social History Narrative   Lives with spouse, son, and stepson who has serious psychiatric issues and alcohol abuse    Objective:  BP 120/78  Pulse 76  Temp(Src) 97.7 F (36.5 C) (Oral)  Resp 16  Wt 147 lb 8 oz (66.906 kg)  BMI 24.16 kg/m2  General appearance: alert, cooperative and appears stated age Ears: normal TM's and external ear canals both ears Throat: lips, mucosa, and tongue normal; teeth and gums normal Neck: no adenopathy, no carotid bruit, supple, symmetrical, trachea midline and thyroid not enlarged, symmetric, no tenderness/mass/nodules Back: symmetric, no curvature. ROM normal. No CVA tenderness. Lungs: clear to auscultation bilaterally Heart: regular rate and rhythm, S1, S2 normal, no murmur, click, rub or gallop Abdomen: soft, non-tender; bowel sounds normal; no masses,  no organomegaly Pulses: 2+ and symmetric Skin: Skin color, texture, turgor normal. No rashes or lesions Lymph nodes:  Cervical, supraclavicular, and axillary nodes normal.  Assessment and Plan:  Alopecia areata Secondary to stress and iatrogenic malnutrition from bypass.  Will review her labs,  Recommend biotin and add spironolactone.   Hypertension Well controlled on current regimen. Renal function stable, no changes today.  Cervicalgia Recurrent with right shoulder and arm pain.  She has had prior Cervical spine surgery and has seen neurosurgery. .  Continue tramadol for daytime relief of pain and vicodin for nocturnal pain.    Updated Medication List Outpatient Encounter Prescriptions as of 07/10/2012  Medication Sig Dispense Refill  . Biotin 1000 MCG tablet Take 1,000 mcg by mouth 3 (three) times daily.      . calcium  carbonate (OS-CAL) 600 MG TABS Take 600 mg by mouth 2 (two) times daily with a meal.      . folic acid (FOLVITE) 1 MG tablet Take 1 mg by mouth daily.      . metoprolol succinate (TOPROL-XL) 50 MG 24 hr tablet Take 1 tablet (50 mg total) by mouth daily. Take with or immediately following a meal.  30 tablet  0  . montelukast (SINGULAIR) 10 MG tablet Take 10 mg by mouth at bedtime.      . Multiple Vitamin (MULTIVITAMIN) tablet Take 1 tablet by mouth daily. Bariatric Vitamin      . sertraline (ZOLOFT) 100 MG tablet Take 100 mg by mouth daily.      . traMADol (ULTRAM) 50 MG tablet Take 1 tablet (50 mg total) by mouth every 6 (six) hours as needed for pain.  120 tablet  3  . vitamin B-12 (CYANOCOBALAMIN) 1000 MCG tablet Take 3,000 mcg by mouth daily.      . [DISCONTINUED] traMADol (ULTRAM) 50 MG tablet Take 1 tablet (50 mg total) by mouth every 6 (six) hours as needed for pain.  90 tablet  3  . chlorpheniramine-HYDROcodone (TUSSIONEX) 10-8 MG/5ML LQCR Take 5 mLs by mouth every 12 (twelve) hours as needed.  240 mL  1  . estradiol-norethindrone (COMBIPATCH) 0.05-0.14 MG/DAY Place 1 patch onto the skin 2 (two) times a week.      . gabapentin (NEURONTIN) 100 MG capsule Take 1 capsule (100 mg total) by mouth 3 (three) times daily.  90 capsule  3  . HYDROcodone-acetaminophen (VICODIN) 5-500 MG per tablet TAKE 1 TABLET BY MOUTH EVERY 6 HOURS AS NEEDED FOR PAIN  120 tablet  3  . spironolactone (ALDACTONE) 12.5 mg TABS Take 0.5 tablets (12.5 mg total) by mouth daily.  30 tablet  6  . [DISCONTINUED] HYDROcodone-acetaminophen (VICODIN) 5-500 MG per tablet TAKE 1 TABLET BY MOUTH EVERY 6 HOURS AS NEEDED FOR PAIN  90 tablet  0   No facility-administered encounter medications on file as of 07/10/2012.     No orders of the defined types were placed in this encounter.    No Follow-up on file.

## 2012-07-11 DIAGNOSIS — M542 Cervicalgia: Secondary | ICD-10-CM | POA: Insufficient documentation

## 2012-07-11 DIAGNOSIS — L639 Alopecia areata, unspecified: Secondary | ICD-10-CM | POA: Insufficient documentation

## 2012-07-11 NOTE — Assessment & Plan Note (Signed)
Well controlled on current regimen. Renal function stable, no changes today. 

## 2012-07-11 NOTE — Assessment & Plan Note (Signed)
Secondary to stress and iatrogenic malnutrition from bypass.  Will review her labs,  Recommend biotin and add spironolactone.

## 2012-07-11 NOTE — Assessment & Plan Note (Signed)
Recurrent with right shoulder and arm pain.  She has had prior Cervical spine surgery and has seen neurosurgery. .  Continue tramadol for daytime relief of pain and vicodin for nocturnal pain.

## 2012-08-08 ENCOUNTER — Telehealth: Payer: Self-pay | Admitting: *Deleted

## 2012-08-08 NOTE — Telephone Encounter (Signed)
Spoke with pt, discharged from [facility] 08/07/12 with abdominal pain. Pt states pain is improving, doctor felt it was related to Lotronex which she has discontinued Follow up visit scheduled with Dr. Darrick Huntsman 08/21/12. Advised pt to call if symptoms worsen or change prior to appointment.

## 2012-08-09 ENCOUNTER — Encounter: Payer: Self-pay | Admitting: Internal Medicine

## 2012-08-09 DIAGNOSIS — N83209 Unspecified ovarian cyst, unspecified side: Secondary | ICD-10-CM

## 2012-08-09 DIAGNOSIS — N83202 Unspecified ovarian cyst, left side: Secondary | ICD-10-CM | POA: Insufficient documentation

## 2012-08-21 ENCOUNTER — Encounter: Payer: Self-pay | Admitting: Internal Medicine

## 2012-08-21 ENCOUNTER — Ambulatory Visit (INDEPENDENT_AMBULATORY_CARE_PROVIDER_SITE_OTHER): Payer: 59 | Admitting: Internal Medicine

## 2012-08-21 VITALS — BP 138/72 | HR 72 | Temp 98.3°F | Resp 14 | Wt 145.5 lb

## 2012-08-21 DIAGNOSIS — N83209 Unspecified ovarian cyst, unspecified side: Secondary | ICD-10-CM

## 2012-08-21 DIAGNOSIS — R5383 Other fatigue: Secondary | ICD-10-CM

## 2012-08-21 DIAGNOSIS — E119 Type 2 diabetes mellitus without complications: Secondary | ICD-10-CM

## 2012-08-21 DIAGNOSIS — K915 Postcholecystectomy syndrome: Secondary | ICD-10-CM

## 2012-08-21 DIAGNOSIS — Z9884 Bariatric surgery status: Secondary | ICD-10-CM

## 2012-08-21 DIAGNOSIS — R5381 Other malaise: Secondary | ICD-10-CM

## 2012-08-21 NOTE — Progress Notes (Signed)
Patient ID: Francetta Found, female   DOB: 1956/01/16, 57 y.o.   MRN: [id number]  Patient Active Problem List   Diagnosis Date Noted  . Post-cholecystectomy syndrome 08/22/2012  . Other and unspecified ovarian cyst 08/09/2012  . Alopecia areata 07/11/2012  . Cervicalgia 07/11/2012  . Thoracic spine pain 10/04/2011  . Biliary dyskinesia 09/08/2011  . Hypertension 05/10/2011  . Diabetes mellitus, controlled 05/10/2011  . Status post gastric bypass for obesity 05/10/2011    Subjective:  CC:   Chief Complaint  Patient presents with  . Follow-up    hospital    HPI:   [name] Briggsis a 57 y.o. female who presents for Hospital follow up for severe abdominal pain .  She has been having recent episodes of LLQ pain,  And her stomach has been upset continually since her gastric bypass  Followed by cholecystectomy.  She has had persistent diarrhea, and was prescribed  Lotronex by Dr Alva Garnet which was helpful until she developed severe lower abdominal pain. She was admitted to [facility] on May 27th and discharged on Tuesday after an evaluation by abdominal and pelvic CT which was worrisome for carcinomatosis due to presence of fluid and a paraovarian cyst  CA 125 was 4.3 .  She is scheduled to have repeat US at [facility] in 6 weeks  She is tired and stressed out.  She had multiple liquid stools yesterday.  Usually able to sleep through the night. .     Past Medical History  Diagnosis Date  . Asthma   . Schatzki's ring     dialated 3 times by Markham Jordan     Past Surgical History  Procedure Laterality Date  . Bunionectomy      left foot   . Cesarean section    . Cholecystectomy  June 2013       The following portions of the patient's history were reviewed and updated as appropriate: Allergies, current medications, and problem list.    Review of Systems:   12 Pt  review of systems was negative except those addressed in the HPI,     History   Social History  . Marital Status: Married     Spouse Name: N/A    Number of Children: N/A  . Years of Education: N/A   Occupational History  . Not on file.   Social History Main Topics  . Smoking status: Never Smoker   . Smokeless tobacco: Never Used  . Alcohol Use: No  . Drug Use: No  . Sexually Active: Not on file   Other Topics Concern  . Not on file   Social History Narrative   Lives with spouse, son, and stepson who has serious psychiatric issues and alcohol abuse    Objective:  BP 138/72  Pulse 72  Temp(Src) 98.3 F (36.8 C) (Oral)  Resp 14  Wt 145 lb 8 oz (65.998 kg)  BMI 23.84 kg/m2  SpO2 99%  General appearance: alert, cooperative and appears stated age Ears: normal TM's and external ear canals both ears Throat: lips, mucosa, and tongue normal; teeth and gums normal Neck: no adenopathy, no carotid bruit, supple, symmetrical, trachea midline and thyroid not enlarged, symmetric, no tenderness/mass/nodules Back: symmetric, no curvature. ROM normal. No CVA tenderness. Lungs: clear to auscultation bilaterally Heart: regular rate and rhythm, S1, S2 normal, no murmur, click, rub or gallop Abdomen: soft, non-tender; bowel sounds normal; no masses,  no organomegaly Pulses: 2+ and symmetric Skin: Skin color, texture, turgor normal. No rashes or lesions Lymph nodes:  Cervical, supraclavicular, and axillary nodes normal. Foot exam:  Nails are well trimmed,  No callouses,  Sensation intact to microfilament  Assessment and Plan:  Diabetes mellitus, controlled Well-controlled on current medications.  hemoglobin A1c has been consistently less than 7.0 . She is up-to-date on eye exams and  foot exam is normal.   we'll repeat his urine microalbumin to creatinine ratio at next visit. She is on the appropriate medications.  Status post gastric bypass for obesity She is not regretting the surgery but is generally miserable due to diarrhea and abdominal pain which has persisted. w  Post-cholecystectomy syndrome LFTS  are normal and CBC is normal. Trial of cholestyramine for persistent diarrhea post cholecystectomy.  Other and unspecified ovarian cyst 2.2 cm left, Found at [facility] on abdominal CT done 5/26 during admission for abd pain (treated for gastritis) .  CA 125 was low at 4.3 .  Follow up ultrasound  rec in 6 weeks    A total of 40 minutes was spent with patient more than half of which was spent in counseling, reviewing records from other prviders and coordination of care.   Updated Medication List Outpatient Encounter Prescriptions as of 08/21/2012  Medication Sig Dispense Refill  . Biotin 1000 MCG tablet Take 1,000 mcg by mouth 3 (three) times daily.      . calcium carbonate (OS-CAL) 600 MG TABS Take 600 mg by mouth 2 (two) times daily with a meal.      . folic acid (FOLVITE) 1 MG tablet Take 1 mg by mouth daily.      Marland Kitchen gabapentin (NEURONTIN) 100 MG capsule Take 1 capsule (100 mg total) by mouth 3 (three) times daily.  90 capsule  3  . HYDROcodone-acetaminophen (VICODIN) 5-500 MG per tablet TAKE 1 TABLET BY MOUTH EVERY 6 HOURS AS NEEDED FOR PAIN  120 tablet  3  . hyoscyamine (LEVSIN, ANASPAZ) 0.125 MG tablet Take 0.125 mg by mouth every 6 (six) hours as needed for cramping.      . metoprolol succinate (TOPROL-XL) 50 MG 24 hr tablet Take 1 tablet (50 mg total) by mouth daily. Take with or immediately following a meal.  30 tablet  0  . montelukast (SINGULAIR) 10 MG tablet Take 10 mg by mouth at bedtime.      . Multiple Vitamin (MULTIVITAMIN) tablet Take 1 tablet by mouth daily. Bariatric Vitamin      . promethazine (PHENERGAN) 12.5 MG tablet Take 12.5 mg by mouth every 6 (six) hours as needed for nausea.      . sertraline (ZOLOFT) 100 MG tablet Take 100 mg by mouth daily.      . traMADol (ULTRAM) 50 MG tablet Take 1 tablet (50 mg total) by mouth every 6 (six) hours as needed for pain.  120 tablet  3  . vitamin B-12 (CYANOCOBALAMIN) 1000 MCG tablet Take 3,000 mcg by mouth daily.      .  chlorpheniramine-HYDROcodone (TUSSIONEX) 10-8 MG/5ML LQCR Take 5 mLs by mouth every 12 (twelve) hours as needed.  240 mL  1  . cholestyramine (QUESTRAN) 4 GM/DOSE powder Take 0.5 packets (2 g total) by mouth 2 (two) times daily with a meal.  378 g  12  . estradiol-norethindrone (COMBIPATCH) 0.05-0.14 MG/DAY Place 1 patch onto the skin 2 (two) times a week.      . spironolactone (ALDACTONE) 12.5 mg TABS Take 0.5 tablets (12.5 mg total) by mouth daily.  30 tablet  6   No facility-administered encounter medications on file as  of 08/21/2012.     Orders Placed This Encounter  Procedures  . Comprehensive metabolic panel  . CBC with Differential    No Follow-up on file.

## 2012-08-21 NOTE — Patient Instructions (Addendum)
I recommend resuming the miralax  bc it is bulk forming and may help bind your stools  There may be a medication to help the diarrhea caused by the gallbladder surgery ; I will look into it and e mail you   We are repeating your blood tests today

## 2012-08-22 DIAGNOSIS — K915 Postcholecystectomy syndrome: Secondary | ICD-10-CM | POA: Insufficient documentation

## 2012-08-22 LAB — CBC WITH DIFFERENTIAL/PLATELET
Basophils Absolute: 0.1 10*3/uL (ref 0.0–0.1)
Basophils Relative: 2 % (ref 0.0–3.0)
Eosinophils Absolute: 0.3 10*3/uL (ref 0.0–0.7)
Eosinophils Relative: 5 % (ref 0.0–5.0)
HCT: 34.8 % — ABNORMAL LOW (ref 36.0–46.0)
Hemoglobin: 11.8 g/dL — ABNORMAL LOW (ref 12.0–15.0)
Lymphocytes Relative: 38.1 % (ref 12.0–46.0)
Lymphs Abs: 2.1 10*3/uL (ref 0.7–4.0)
MCHC: 33.9 g/dL (ref 30.0–36.0)
MCV: 89.5 fl (ref 78.0–100.0)
Monocytes Absolute: 0.3 10*3/uL (ref 0.1–1.0)
Monocytes Relative: 5.2 % (ref 3.0–12.0)
Neutro Abs: 2.7 10*3/uL (ref 1.4–7.7)
Neutrophils Relative %: 49.7 % (ref 43.0–77.0)
Platelets: 193 10*3/uL (ref 150.0–400.0)
RBC: 3.89 Mil/uL (ref 3.87–5.11)
RDW: 12.2 % (ref 11.5–14.6)
WBC: 5.4 10*3/uL (ref 4.5–10.5)

## 2012-08-22 LAB — COMPREHENSIVE METABOLIC PANEL
ALT: 37 U/L — ABNORMAL HIGH (ref 0–35)
AST: 34 U/L (ref 0–37)
Albumin: 3.7 g/dL (ref 3.5–5.2)
Alkaline Phosphatase: 55 U/L (ref 39–117)
BUN: 9 mg/dL (ref 6–23)
CO2: 24 mEq/L (ref 19–32)
Calcium: 9.5 mg/dL (ref 8.4–10.5)
Chloride: 109 mEq/L (ref 96–112)
Creatinine, Ser: 0.6 mg/dL (ref 0.4–1.2)
GFR: 115.92 mL/min (ref >60.00)
Glucose, Bld: 91 mg/dL (ref 70–99)
Potassium: 4.4 mEq/L (ref 3.5–5.1)
Sodium: 141 mEq/L (ref 135–145)
Total Bilirubin: 0.3 mg/dL (ref 0.3–1.2)
Total Protein: 6.9 g/dL (ref 6.0–8.3)

## 2012-08-22 MED ORDER — CHOLESTYRAMINE 4 GM/DOSE PO POWD: 2.0000 g | Freq: Two times a day (BID) | ORAL | Status: DC

## 2012-08-22 NOTE — Assessment & Plan Note (Addendum)
Well-controlled on current medications.  hemoglobin A1c has been consistently less than 7.0 . She is up-to-date on eye exams and  foot exam is normal.   we'll repeat his urine microalbumin to creatinine ratio at next visit. She is on the appropriate medications.

## 2012-08-22 NOTE — Assessment & Plan Note (Signed)
2.2 cm left, Found at [facility] on abdominal CT done 5/26 during admission for abd pain (treated for gastritis) .  CA 125 was low at 4.3 .  Follow up ultrasound  rec in 6 weeks

## 2012-08-22 NOTE — Assessment & Plan Note (Signed)
She is not regretting the surgery but is generally miserable due to diarrhea and abdominal pain which has persisted. w

## 2012-08-22 NOTE — Assessment & Plan Note (Addendum)
LFTS are normal and CBC is normal. Trial of cholestyramine for persistent diarrhea post cholecystectomy.

## 2012-08-23 ENCOUNTER — Other Ambulatory Visit: Payer: Self-pay | Admitting: *Deleted

## 2012-08-23 NOTE — Telephone Encounter (Signed)
Script faxed.

## 2012-09-26 ENCOUNTER — Other Ambulatory Visit: Payer: Self-pay | Admitting: Internal Medicine

## 2012-09-27 ENCOUNTER — Encounter: Payer: Self-pay | Admitting: Internal Medicine

## 2012-10-05 ENCOUNTER — Encounter: Payer: Self-pay | Admitting: Bariatrics

## 2012-10-21 ENCOUNTER — Other Ambulatory Visit: Payer: Self-pay | Admitting: Internal Medicine

## 2012-10-28 ENCOUNTER — Other Ambulatory Visit: Payer: Self-pay | Admitting: Internal Medicine

## 2012-11-08 ENCOUNTER — Other Ambulatory Visit: Payer: Self-pay | Admitting: Internal Medicine

## 2012-11-09 NOTE — Telephone Encounter (Signed)
Okay to refill? Dr. Melina Schools patient

## 2012-11-26 MED ORDER — TRAMADOL HCL 50 MG PO TABS: ORAL_TABLET | ORAL | Status: DC

## 2012-11-26 NOTE — Addendum Note (Signed)
Addended by: [name] on: 11/26/2012 09:20 AM   Modules accepted: Orders

## 2012-11-26 NOTE — Telephone Encounter (Signed)
Yes,  Refill Berkley Harvey

## 2013-01-09 ENCOUNTER — Telehealth: Payer: Self-pay | Admitting: *Deleted

## 2013-01-09 MED ORDER — HYDROCODONE-ACETAMINOPHEN 5-500 MG PO TABS: ORAL_TABLET | ORAL | Status: DC

## 2013-01-09 NOTE — Telephone Encounter (Signed)
Last visit 08/21/12, refill?

## 2013-01-09 NOTE — Telephone Encounter (Signed)
Patient returned your call states that the medication is hydrocodone. I explained to patient this was a prescription that will need to be picked up by her and we would contact her once it is ready. Also explained that we would send to Dr. Darrick Huntsman for approval however she is seeing patients and it might be after clinic before she gives an answer.  She also indicated that she left msg last week and she is completely out of medication now.

## 2013-01-09 NOTE — Telephone Encounter (Signed)
Patient called concerning refill but did not leave name of medication? Returned patient call but no answer left message for patient to return call.

## 2013-01-09 NOTE — Telephone Encounter (Signed)
Where are these messages going that patients are leaving?  We should be able to track them to see how they are being misrouted, because it is happening a lot

## 2013-01-10 ENCOUNTER — Encounter: Payer: Self-pay | Admitting: *Deleted

## 2013-01-10 NOTE — Telephone Encounter (Signed)
Patient notified and script  To be picked up by patient , needs UDS.

## 2013-01-31 ENCOUNTER — Encounter: Payer: Self-pay | Admitting: Internal Medicine

## 2013-02-13 ENCOUNTER — Other Ambulatory Visit: Payer: Self-pay | Admitting: Internal Medicine

## 2013-02-13 NOTE — Telephone Encounter (Signed)
Last visit 08/21/12 with no upcoming appt scheduled

## 2013-02-13 NOTE — Telephone Encounter (Signed)
HYDROcodone-acetaminophen (VICODIN) 5-500 MG per tablet

## 2013-02-14 ENCOUNTER — Telehealth: Payer: Self-pay | Admitting: Internal Medicine

## 2013-02-14 MED ORDER — HYDROCODONE-ACETAMINOPHEN 5-500 MG PO TABS: ORAL_TABLET | ORAL | Status: DC

## 2013-02-14 NOTE — Telephone Encounter (Signed)
Pt notified Rx ready for pickup and need for appointment. See telephone note.

## 2013-02-14 NOTE — Telephone Encounter (Signed)
Left message, notifying rx ready for pickup and need for appointment.

## 2013-02-14 NOTE — Telephone Encounter (Signed)
The vicodin was prescribed bc of abdominal pain,.  It can be refilled for one month  But  she has been using it regularly for 6 months and needs to be seen

## 2013-02-26 ENCOUNTER — Encounter (INDEPENDENT_AMBULATORY_CARE_PROVIDER_SITE_OTHER): Payer: Self-pay

## 2013-02-26 ENCOUNTER — Ambulatory Visit (INDEPENDENT_AMBULATORY_CARE_PROVIDER_SITE_OTHER): Payer: 59 | Admitting: Internal Medicine

## 2013-02-26 VITALS — BP 132/76 | HR 90 | Temp 98.9°F | Resp 12 | Wt 160.0 lb

## 2013-02-26 DIAGNOSIS — F489 Nonpsychotic mental disorder, unspecified: Secondary | ICD-10-CM

## 2013-02-26 DIAGNOSIS — F409 Phobic anxiety disorder, unspecified: Secondary | ICD-10-CM | POA: Insufficient documentation

## 2013-02-26 DIAGNOSIS — N83209 Unspecified ovarian cyst, unspecified side: Secondary | ICD-10-CM

## 2013-02-26 DIAGNOSIS — H00019 Hordeolum externum unspecified eye, unspecified eyelid: Secondary | ICD-10-CM

## 2013-02-26 DIAGNOSIS — J069 Acute upper respiratory infection, unspecified: Secondary | ICD-10-CM

## 2013-02-26 DIAGNOSIS — I10 Essential (primary) hypertension: Secondary | ICD-10-CM

## 2013-02-26 DIAGNOSIS — Z9884 Bariatric surgery status: Secondary | ICD-10-CM

## 2013-02-26 DIAGNOSIS — E119 Type 2 diabetes mellitus without complications: Secondary | ICD-10-CM

## 2013-02-26 DIAGNOSIS — E559 Vitamin D deficiency, unspecified: Secondary | ICD-10-CM

## 2013-02-26 DIAGNOSIS — K915 Postcholecystectomy syndrome: Secondary | ICD-10-CM

## 2013-02-26 DIAGNOSIS — Z1239 Encounter for other screening for malignant neoplasm of breast: Secondary | ICD-10-CM

## 2013-02-26 DIAGNOSIS — H00016 Hordeolum externum left eye, unspecified eyelid: Secondary | ICD-10-CM

## 2013-02-26 MED ORDER — TRAZODONE HCL 50 MG PO TABS: 25.0000 mg | ORAL_TABLET | Freq: Every evening | ORAL | Status: DC | PRN

## 2013-02-26 MED ORDER — ERYTHROMYCIN 5 MG/GM OP OINT: TOPICAL_OINTMENT | OPHTHALMIC | Status: DC

## 2013-02-26 MED ORDER — PREDNISONE (PAK) 10 MG PO TABS: ORAL_TABLET | ORAL | Status: DC

## 2013-02-26 NOTE — Progress Notes (Signed)
Pre visit review using our clinic review tool, if applicable. No additional management support is needed unless otherwise documented below in the visit note. 

## 2013-02-26 NOTE — Progress Notes (Signed)
Patient ID: Sherri Lee, female   DOB: 11-Jan-1956, 57 y.o.   MRN: [id number] Patient Active Problem List   Diagnosis Date Noted  . Insomnia due to anxiety and fear 02/26/2013  . Viral URI 02/26/2013  . Stye 02/26/2013  . Post-cholecystectomy syndrome 08/22/2012  . Other and unspecified ovarian cyst 08/09/2012  . Alopecia areata 07/11/2012  . Cervicalgia 07/11/2012  . Thoracic spine pain 10/04/2011  . Biliary dyskinesia 09/08/2011  . Hypertension 05/10/2011  . Diabetes mellitus, controlled 05/10/2011  . Status post gastric bypass for obesity 05/10/2011    Subjective:  CC:   Chief Complaint  Patient presents with  . Follow-up  . Sinusitis    HPI:   Sherri Lee a 57 y.o. female who presents for follow up on recent uri,  chronic medical issues including prior bariatric surgery for obesity, chronic diarrhea and anxiety.  Her right ear has felt stopped up for several weeks .Sherri Lee  Nasal drainage has improved and is now clear .  Has a frontal headache behind the eyes daily .using claritin d and benadryl   Her diarrhea has improved without using the cholestyrramine, but her abdominal  pain and nauea have returned.  She has not been following the bariatric diet,  Not exercising.  Has been overeating,  And has gained over 5 lbs since last visit. She is seeing her bariatric surgeon tomorrow.   She has developed erythema and swelling of the left upper eyekid that has been present for over 4 days. No visual changes      Past Medical History  Diagnosis Date  . Asthma   . Schatzki's ring     dialated 3 times by Markham Jordan     Past Surgical History  Procedure Laterality Date  . Bunionectomy      left foot   . Cesarean section    . Cholecystectomy  June 2013       The following portions of the patient's history were reviewed and updated as appropriate: Allergies, current medications, and problem list.    Review of Systems:   12 Pt  review of systems was negative except  those addressed in the HPI,     History   Social History  . Marital Status: Married    Spouse Name: N/A    Number of Children: N/A  . Years of Education: N/A   Occupational History  . Not on file.   Social History Main Topics  . Smoking status: Never Smoker   . Smokeless tobacco: Never Used  . Alcohol Use: No  . Drug Use: No  . Sexual Activity: Yes   Other Topics Concern  . Not on file   Social History Narrative   Lives with spouse, son, and stepson who has serious psychiatric issues and alcohol abuse    Objective:  Filed Vitals:   02/26/13 1636  BP: 132/76  Pulse: 90  Temp: 98.9 F (37.2 C)  Resp: 12     General appearance: alert, cooperative and appears stated age Ears: normal TM's and external ear canals both ears Throat: lips, mucosa, and tongue normal; teeth and gums normal Neck: no adenopathy, no carotid bruit, supple, symmetrical, trachea midline and thyroid not enlarged, symmetric, no tenderness/mass/nodules Back: symmetric, no curvature. ROM normal. No CVA tenderness. Lungs: clear to auscultation bilaterally Heart: regular rate and rhythm, S1, S2 normal, no murmur, click, rub or gallop Abdomen: soft, non-tender; bowel sounds normal; no masses,  no organomegaly Pulses: 2+ and symmetric Skin: Skin color, texture, turgor normal.  No rashes or lesions Lymph nodes: Cervical, supraclavicular, and axillary nodes normal.  Assessment and Plan:  Insomnia due to anxiety and fear Trial of trazodone.  Avoiding controlled substances since she is concerned about son using drugs.   Status post gastric bypass for obesity She has begun overeating again and is experiencing abdominal pain and nausea, likely from gastric distension I have addressed  BMI and recommended a low glycemic index diet utilizing smaller more frequent meals to increase metabolism.  I have also recommended that patient start exercising with a goal of 30 minutes of aerobic exercise a minimum of 5  days per week.   Viral URI HEENT exam normal.  Prednisone and dymista  Prescribed for persistent congestion without signs of  bacterial infection.    Post-cholecystectomy syndrome She did not complete trial of cholestyramine. Diarrhea has reportedly improved.   Diabetes mellitus, controlled Well-controlled on diet since bariatric surgery and weight loss but has not had an A1c in over a year, or fasting lipids in over a year. She is not  up-to-date on eye exams but  foot exam is normal.   Referral for eye exam made and she will return or get fasting lipids done at [facility]  Hypertension Well controlled on current regimen., no changes today.  A total of 40 minutes was spent with patient more than half of which was spent in counseling, reviewing records from other prviders and coordination of care.  Updated Medication List Outpatient Encounter Prescriptions as of 02/26/2013  Medication Sig  . Biotin 1000 MCG tablet Take 1,000 mcg by mouth 3 (three) times daily.  . calcium carbonate (OS-CAL) 600 MG TABS Take 600 mg by mouth 2 (two) times daily with a meal.  . folic acid (FOLVITE) 1 MG tablet Take 1 mg by mouth daily.  Sherri Lee gabapentin (NEURONTIN) 100 MG capsule TAKE 1 CAPSULE (100 MG TOTAL) BY MOUTH 3 (THREE) TIMES DAILY.  Sherri Lee HYDROcodone-acetaminophen (VICODIN) 5-500 MG per tablet TAKE 1 TABLET BY MOUTH EVERY 6 HOURS AS NEEDED FOR PAIN  . hyoscyamine (LEVSIN, ANASPAZ) 0.125 MG tablet Take 0.125 mg by mouth every 6 (six) hours as needed for cramping.  . metoprolol succinate (TOPROL-XL) 50 MG 24 hr tablet TAKE 1 TABLET (50 MG TOTAL) BY MOUTH DAILY WITH OR IMMEDIATELY FOLLOWING A MEAL.  . montelukast (SINGULAIR) 10 MG tablet Take 10 mg by mouth at bedtime.  . Multiple Vitamin (MULTIVITAMIN) tablet Take 1 tablet by mouth daily. Bariatric Vitamin  . promethazine (PHENERGAN) 12.5 MG tablet Take 12.5 mg by mouth every 6 (six) hours as needed for nausea.  . sertraline (ZOLOFT) 100 MG tablet Take 100 mg by  mouth daily.  Sherri Lee spironolactone (ALDACTONE) 12.5 mg TABS Take 0.5 tablets (12.5 mg total) by mouth daily.  . traMADol (ULTRAM) 50 MG tablet TAKE 1 TABLET (50 MG TOTAL) BY MOUTH EVERY 6 (SIX) HOURS AS NEEDED FOR PAIN.  Sherri Lee vitamin B-12 (CYANOCOBALAMIN) 1000 MCG tablet Take 3,000 mcg by mouth daily.  . chlorpheniramine-HYDROcodone (TUSSIONEX) 10-8 MG/5ML LQCR Take 5 mLs by mouth every 12 (twelve) hours as needed.  Sherri Lee erythromycin [facility]) ophthalmic ointment Apply to left eyelid 4 times daily  . estradiol-norethindrone (COMBIPATCH) 0.05-0.14 MG/DAY Place 1 patch onto the skin 2 (two) times a week.  . predniSONE (STERAPRED UNI-PAK) 10 MG tablet 6 tablets on Day 1 , then reduce by 1 tablet daily until gone  . traZODone (DESYREL) 50 MG tablet Take 0.5-1 tablets (25-50 mg total) by mouth at bedtime as needed for sleep.  . [  DISCONTINUED] cholestyramine (QUESTRAN) 4 GM/DOSE powder Take 0.5 packets (2 g total) by mouth 2 (two) times daily with a meal.     Orders Placed This Encounter  Procedures  . MM Digital Screening  . Hemoglobin A1c  . Lipid panel    No Follow-up on file.

## 2013-02-26 NOTE — Patient Instructions (Addendum)
You have a viral  Syndrome .  The post nasal drip is causing your sore throat.  Lavage your sinuses twice daily with Simply saline nasal spray.  I am adding a prednisone taper to help relieve your congested sinus passages and a topical antibiotic for your eyelid infection   If you develop T > 100.4,  Green nasal discharge,  Or facial pain,  Call for an antibiotic.  Please take a probiotic ( Align, Floraque or Culturelle) if you end up starting an antibiotic to prevent a serious antibiotic associated diarrhea  Called clostirudium dificile colitis and a vaginal yeast infection    Trial of trazodone for your insomnia.  Take 30 minutes prior to bedtime 50 to 100 mg dose

## 2013-02-26 NOTE — Assessment & Plan Note (Signed)
Trial of trazodone.  Avoiding controlled substances since she is concerned about son using drugs.

## 2013-03-01 ENCOUNTER — Encounter: Payer: Self-pay | Admitting: Internal Medicine

## 2013-03-01 NOTE — Assessment & Plan Note (Signed)
HEENT exam normal.  Prednisone and dymista  Prescribed for persistent congestion without signs of  bacterial infection.

## 2013-03-01 NOTE — Assessment & Plan Note (Signed)
Left paraovarian, dound on ultrasound and CT during May 2014 [facility] admission for abdominal pain.  CA 125 was 4.  Repeat 6 month follow up was recommended.  Which she has not done.  This will be ordered today

## 2013-03-01 NOTE — Assessment & Plan Note (Signed)
Topical abx ointment prescribed.

## 2013-03-01 NOTE — Assessment & Plan Note (Signed)
She did not complete trial of cholestyramine. Diarrhea has reportedly improved.

## 2013-03-01 NOTE — Assessment & Plan Note (Signed)
Well controlled on current regimen, no changes today. 

## 2013-03-01 NOTE — Assessment & Plan Note (Addendum)
She has begun overeating again and is experiencing abdominal pain and nausea, likely from gastric distension I have addressed  BMI and recommended a low glycemic index diet utilizing smaller more frequent meals to increase metabolism.  I have also recommended that patient start exercising with a goal of 30 minutes of aerobic exercise a minimum of 5 days per week.

## 2013-03-01 NOTE — Assessment & Plan Note (Addendum)
Well-controlled on diet since bariatric surgery and weight loss but has not had an A1c in over a year, or fasting lipids in over a year. She is not  up-to-date on eye exams but  foot exam is normal.   Referral for eye exam made and she will return or get fasting lipids done at [facility]

## 2013-03-04 ENCOUNTER — Telehealth: Payer: Self-pay | Admitting: Emergency Medicine

## 2013-03-04 NOTE — Telephone Encounter (Signed)
Needs to be seen in a visit. 

## 2013-03-04 NOTE — Telephone Encounter (Signed)
I called and left vm asking patient to return my call so we could give her an appointment.

## 2013-03-04 NOTE — Telephone Encounter (Signed)
Pt stated she is not feeling any better. She is still having a really bad headache and her ears and sinus' are feeling the same but not worse. Is there anything else we can do?

## 2013-03-05 NOTE — Telephone Encounter (Signed)
The patient was given to available slots to come in and she could not make the 11:45 today, because she was at work . She did not want the morning appointment with Raquel because she wanted to get some sleep. She stated she would try to call back on Friday if she is not any better.

## 2013-03-05 NOTE — Telephone Encounter (Signed)
Left message with husband requesting patient return call to office.

## 2013-03-05 NOTE — Telephone Encounter (Signed)
Noted  

## 2013-03-12 ENCOUNTER — Telehealth: Payer: Self-pay | Admitting: Internal Medicine

## 2013-03-12 DIAGNOSIS — N83209 Unspecified ovarian cyst, unspecified side: Secondary | ICD-10-CM

## 2013-03-12 NOTE — Telephone Encounter (Signed)
There has been a slight interval increase in the size of  The simple cystic left adnexa lesion by repeat U/s dec 2015. It is most consistent with a  paraovarian cyst or ovarian cyst adenoma. continued follow up is recommended. I am not comfortable following this without a gynecologist following as well and will refer her to Dr of her choosing.

## 2013-03-12 NOTE — Assessment & Plan Note (Signed)
There has been a slight interval increase in the size of  The simple cystic left adnexa lesion by repeat U/s dec 2015. It is most consistent with a  paraovarian cyst or ovarian cyst adenoma. continued follow up is recommended. I am not comfortable following this without a gynecologist following as well and will refer her to Dr of her choosing.   

## 2013-03-13 NOTE — Telephone Encounter (Signed)
Left message for pt to return my call, notified of results, requested call back for referral.

## 2013-03-18 NOTE — Telephone Encounter (Signed)
Left message for pt to return my call.

## 2013-03-20 NOTE — Telephone Encounter (Signed)
Pt has not returned my calls, letter mailed.

## 2013-03-20 NOTE — Telephone Encounter (Signed)
Left message for patient to return call to office. 

## 2013-03-21 NOTE — Telephone Encounter (Signed)
[facility]  [facility] Foy GuadalajaraFried and will make an appointment to see him I also recommended to patient to take copy of report to visit advised patient she could obtain a copy from place of U/S. FYI

## 2013-04-04 ENCOUNTER — Telehealth: Payer: Self-pay | Admitting: Internal Medicine

## 2013-04-04 DIAGNOSIS — M542 Cervicalgia: Secondary | ICD-10-CM

## 2013-04-04 NOTE — Telephone Encounter (Signed)
HYDROcodone-acetaminophen (VICODIN) 5-325 MG per tablet

## 2013-04-04 NOTE — Telephone Encounter (Signed)
Please advise ok to fill 

## 2013-04-05 ENCOUNTER — Encounter: Payer: Self-pay | Admitting: Internal Medicine

## 2013-04-05 MED ORDER — HYDROCODONE-ACETAMINOPHEN 5-500 MG PO TABS: ORAL_TABLET | ORAL | Status: DC

## 2013-04-05 NOTE — Addendum Note (Signed)
Addended by: Sherlene ShamsULLO, Rachit Grim L on: 04/05/2013 06:53 AM   Modules accepted: Orders

## 2013-04-05 NOTE — Telephone Encounter (Signed)
Patient notified and placed up front for pick up.

## 2013-04-05 NOTE — Telephone Encounter (Signed)
Ok to refill,  printed rx  

## 2013-05-10 ENCOUNTER — Other Ambulatory Visit: Payer: Self-pay | Admitting: Internal Medicine

## 2013-05-16 ENCOUNTER — Telehealth: Payer: Self-pay | Admitting: Internal Medicine

## 2013-05-16 NOTE — Telephone Encounter (Signed)
Pt left vm.  States she needs refill on hydrocodone.  Call when ready for pick up

## 2013-05-17 MED ORDER — HYDROCODONE-ACETAMINOPHEN 5-325 MG PO TABS: 1.0000 | ORAL_TABLET | Freq: Four times a day (QID) | ORAL | Status: DC | PRN

## 2013-05-17 NOTE — Telephone Encounter (Signed)
Last fill 04/05/13 please advise?

## 2013-05-17 NOTE — Telephone Encounter (Signed)
Ok to refill,  printed rx  

## 2013-05-17 NOTE — Telephone Encounter (Signed)
Patient notified script placed up front. 

## 2013-05-28 ENCOUNTER — Encounter (INDEPENDENT_AMBULATORY_CARE_PROVIDER_SITE_OTHER): Payer: Self-pay

## 2013-05-28 ENCOUNTER — Ambulatory Visit (INDEPENDENT_AMBULATORY_CARE_PROVIDER_SITE_OTHER): Payer: 59 | Admitting: Internal Medicine

## 2013-05-28 ENCOUNTER — Encounter: Payer: Self-pay | Admitting: Internal Medicine

## 2013-05-28 VITALS — BP 120/84 | HR 68 | Temp 98.0°F | Resp 18 | Wt 161.8 lb

## 2013-05-28 DIAGNOSIS — F5105 Insomnia due to other mental disorder: Secondary | ICD-10-CM

## 2013-05-28 DIAGNOSIS — F489 Nonpsychotic mental disorder, unspecified: Secondary | ICD-10-CM

## 2013-05-28 DIAGNOSIS — R05 Cough: Secondary | ICD-10-CM

## 2013-05-28 DIAGNOSIS — J0100 Acute maxillary sinusitis, unspecified: Secondary | ICD-10-CM

## 2013-05-28 DIAGNOSIS — R059 Cough, unspecified: Secondary | ICD-10-CM

## 2013-05-28 DIAGNOSIS — J0101 Acute recurrent maxillary sinusitis: Secondary | ICD-10-CM

## 2013-05-28 DIAGNOSIS — F409 Phobic anxiety disorder, unspecified: Secondary | ICD-10-CM

## 2013-05-28 LAB — POC INFLUENZA A&B (BINAX/QUICKVUE)
Influenza A, POC: NEGATIVE
Influenza B, POC: NEGATIVE

## 2013-05-28 MED ORDER — AZELASTINE-FLUTICASONE 137-50 MCG/ACT NA SUSP: 2.0000 | Freq: Every day | NASAL | Status: DC

## 2013-05-28 MED ORDER — ALBUTEROL SULFATE HFA 108 (90 BASE) MCG/ACT IN AERS: 2.0000 | INHALATION_SPRAY | Freq: Four times a day (QID) | RESPIRATORY_TRACT | Status: DC | PRN

## 2013-05-28 MED ORDER — HYDROCOD POLST-CHLORPHEN POLST 10-8 MG/5ML PO LQCR: 5.0000 mL | Freq: Every evening | ORAL | Status: DC | PRN

## 2013-05-28 MED ORDER — PREDNISONE (PAK) 10 MG PO TABS: ORAL_TABLET | ORAL | Status: DC

## 2013-05-28 MED ORDER — AMOXICILLIN-POT CLAVULANATE 875-125 MG PO TABS: 1.0000 | ORAL_TABLET | Freq: Two times a day (BID) | ORAL | Status: DC

## 2013-05-28 NOTE — Progress Notes (Signed)
Pre-visit discussion using our clinic review tool. No additional management support is needed unless otherwise documented below in the visit note.  

## 2013-05-28 NOTE — Patient Instructions (Signed)
You have a sinus/ear infection   .  I am prescribing an antibiotic  and a cough suppressant  With hyrdocodone in it  I also advise use of the following OTC meds to help with your other symptoms.   Take generic OTC benadryl 25 mg every 8 hours for the drainage,  Sudafed PE  10 to 30 mg every 8 hours for the congestion, you may substitute Afrin nasal spray for the nighttime dose of sudafed PE  If needed to prevent insomnia.  flushes your sinuses twice daily with Simply Saline (do over the sink because if you do it right you will spit out globs of mucus)   OTC  Delsym   For daytime cough,  Tussionex for nighttime   Gargle with salt water as needed for sore throat.   Dymista (samples) for allergic rhinitis,.  Resume allegra or you regular seasonal antihistamine.  You can continue the dymista if it helps   Add the prednisone and albuterol if you develop wheezing

## 2013-05-28 NOTE — Progress Notes (Addendum)
Patient ID: Sherri Lee, female   DOB: May 26, 1955, 58 y.o.   MRN: [id number]   Patient Active Problem List   Diagnosis Date Noted  . Acute recurrent maxillary sinusitis 06/01/2013  . Insomnia due to anxiety and fear 02/26/2013  . Viral URI 02/26/2013  . Stye 02/26/2013  . Post-cholecystectomy syndrome 08/22/2012  . Other and unspecified ovarian cyst 08/09/2012  . Alopecia areata 07/11/2012  . Cervicalgia 07/11/2012  . Thoracic spine pain 10/04/2011  . Biliary dyskinesia 09/08/2011  . Hypertension 05/10/2011  . Diabetes mellitus, controlled 05/10/2011  . Status post gastric bypass for obesity 05/10/2011    Subjective:  CC:   Chief Complaint  Patient presents with  . Cough    Body aches, chills, hot and cold  . Headache    HPI:   Sherri Lee is a 58 y.o. female who presents for Sinus drainage,  Sore throat,  Headaches for one week. She has noted no improvement with OTC meds.  Still very achey and fatigued.  Husband became ill before her and was treated for bronchitis.   Recent History :  She was treated for bacterial sinusitis by Urgent care in late December after her viral URI failed to resolve. Does not remember what antibiotic she was prescribed.   Her right ear has felt stopped up for several weeks  Past Surgical History  Procedure Laterality Date  . Bunionectomy      left foot   . Cesarean section    . Cholecystectomy  June 2013       The following portions of the patient's history were reviewed and updated as appropriate: Allergies, current medications, and problem list.    Review of Systems:   Patient denies headache, fevers, malaise, unintentional weight loss, skin rash, eye pain, sinus congestion and sinus pain, sore throat, dysphagia,  hemoptysis , cough, dyspnea, wheezing, chest pain, palpitations, orthopnea, edema, abdominal pain, nausea, melena, diarrhea, constipation, flank pain, dysuria, hematuria, urinary  Frequency, nocturia, numbness,  tingling, seizures,  Focal weakness, Loss of consciousness,  Tremor, insomnia, depression, anxiety, and suicidal ideation.     History   Social History  . Marital Status: Married    Spouse Name: N/A    Number of Children: N/A  . Years of Education: N/A   Occupational History  . Not on file.   Social History Main Topics  . Smoking status: Never Smoker   . Smokeless tobacco: Never Used  . Alcohol Use: No  . Drug Use: No  . Sexual Activity: Yes   Other Topics Concern  . Not on file   Social History Narrative   Lives with spouse, son, and stepson who has serious psychiatric issues and alcohol abuse    Objective:  Filed Vitals:   05/28/13 1435  BP: 120/84  Pulse: 68  Temp: 98 F (36.7 C)  Resp: 18     General appearance: alert, cooperative and appears stated age Ears: serous effusion behind right TM , right maxillary sinus tenderness Throat: lips, mucosa, and tongue normal; teeth and gums normal Neck: no adenopathy, no carotid bruit, supple, symmetrical, trachea midline and thyroid not enlarged, symmetric, no tenderness/mass/nodules Back: symmetric, no curvature. ROM normal. No CVA tenderness. Lungs: clear to auscultation bilaterally Heart: regular rate and rhythm, S1, S2 normal, no murmur, click, rub or gallop Abdomen: soft, non-tender; bowel sounds normal; no masses,  no organomegaly Pulses: 2+ and symmetric Skin: Skin color, texture, turgor normal. No rashes or lesions Lymph nodes: Cervical, supraclavicular, and axillary nodes normal.  Assessment and Plan:  Acute recurrent maxillary sinusitis Given chronicity of symptoms, development of facial pain and exam consistent with bacterial URI,  Will treat with augmentin, decongestants, saline lavagem ad Dymista nasal spray.     Updated Medication List Outpatient Encounter Prescriptions as of 05/28/2013  Medication Sig  . Biotin 1000 MCG tablet Take 1,000 mcg by mouth 3 (three) times daily.  . calcium carbonate  (OS-CAL) 600 MG TABS Take 600 mg by mouth 2 (two) times daily with a meal.  . folic acid (FOLVITE) 1 MG tablet Take 1 mg by mouth daily.  Marland Kitchen. gabapentin (NEURONTIN) 100 MG capsule TAKE 1 CAPSULE (100 MG TOTAL) BY MOUTH 3 (THREE) TIMES DAILY.  Marland Kitchen. HYDROcodone-acetaminophen (NORCO/VICODIN) 5-325 MG per tablet Take 1 tablet by mouth every 6 (six) hours as needed for moderate pain.  . hyoscyamine (LEVSIN, ANASPAZ) 0.125 MG tablet Take 0.125 mg by mouth every 6 (six) hours as needed for cramping.  . metoprolol succinate (TOPROL-XL) 50 MG 24 hr tablet TAKE 1 TABLET (50 MG TOTAL) BY MOUTH DAILY WITH OR IMMEDIATELY FOLLOWING A MEAL.  . Multiple Vitamin (MULTIVITAMIN) tablet Take 1 tablet by mouth daily. Bariatric Vitamin  . sertraline (ZOLOFT) 100 MG tablet Take 100 mg by mouth daily.  . traMADol (ULTRAM) 50 MG tablet TAKE 1 TABLET (50 MG TOTAL) BY MOUTH EVERY 6 (SIX) HOURS AS NEEDED FOR PAIN.  . traZODone (DESYREL) 50 MG tablet Take 0.5-1 tablets (25-50 mg total) by mouth at bedtime as needed for sleep.  . vitamin B-12 (CYANOCOBALAMIN) 1000 MCG tablet Take 3,000 mcg by mouth daily.  Marland Kitchen. albuterol (PROVENTIL HFA;VENTOLIN HFA) 108 (90 BASE) MCG/ACT inhaler Inhale 2 puffs into the lungs every 6 (six) hours as needed for wheezing or shortness of breath.  Marland Kitchen. amoxicillin-clavulanate (AUGMENTIN) 875-125 MG per tablet Take 1 tablet by mouth 2 (two) times daily.  . Azelastine-Fluticasone (DYMISTA) 137-50 MCG/ACT SUSP Place 2 Squirts into the nose daily. In each nostril  . chlorpheniramine-HYDROcodone (TUSSIONEX) 10-8 MG/5ML LQCR Take 5 mLs by mouth at bedtime as needed.  Marland Kitchen. erythromycin [facility](ROMYCIN) ophthalmic ointment Apply to left eyelid 4 times daily  . estradiol-norethindrone (COMBIPATCH) 0.05-0.14 MG/DAY Place 1 patch onto the skin 2 (two) times a week.  Marland Kitchen. HYDROcodone-acetaminophen (VICODIN) 5-500 MG per tablet TAKE 1 TABLET BY MOUTH EVERY 6 HOURS AS NEEDED FOR PAIN  . montelukast (SINGULAIR) 10 MG tablet Take 10 mg by  mouth at bedtime.  . predniSONE (STERAPRED UNI-PAK) 10 MG tablet 6 tablets on Day 1 , then reduce by 1 tablet daily until gone  . predniSONE (STERAPRED UNI-PAK) 10 MG tablet 6 tablets on Day 1 , then reduce by 1 tablet daily until gone  . promethazine (PHENERGAN) 12.5 MG tablet Take 12.5 mg by mouth every 6 (six) hours as needed for nausea.  Marland Kitchen. spironolactone (ALDACTONE) 12.5 mg TABS Take 0.5 tablets (12.5 mg total) by mouth daily.  . [DISCONTINUED] chlorpheniramine-HYDROcodone (TUSSIONEX) 10-8 MG/5ML LQCR Take 5 mLs by mouth every 12 (twelve) hours as needed.  . [DISCONTINUED] predniSONE (STERAPRED UNI-PAK) 10 MG tablet 6 tablets on Day 1 , then reduce by 1 tablet daily until gone     Orders Placed This Encounter  Procedures  . POC Influenza A&B (Binax test)    No Follow-up on file.

## 2013-06-01 ENCOUNTER — Encounter: Payer: Self-pay | Admitting: Internal Medicine

## 2013-06-01 DIAGNOSIS — J0101 Acute recurrent maxillary sinusitis: Secondary | ICD-10-CM | POA: Insufficient documentation

## 2013-06-01 NOTE — Assessment & Plan Note (Addendum)
Given chronicity of symptoms, development of facial pain and exam consistent with bacterial URI,  Will treat with augmentin, decongestants, saline lavagem ad Dymista nasal spray.

## 2013-06-07 ENCOUNTER — Other Ambulatory Visit: Payer: Self-pay | Admitting: Internal Medicine

## 2013-06-19 ENCOUNTER — Telehealth: Payer: Self-pay | Admitting: Internal Medicine

## 2013-06-19 MED ORDER — HYDROCODONE-ACETAMINOPHEN 5-325 MG PO TABS: 1.0000 | ORAL_TABLET | Freq: Four times a day (QID) | ORAL | Status: DC | PRN

## 2013-06-19 NOTE — Telephone Encounter (Signed)
Patient notified script placed up front. 

## 2013-06-19 NOTE — Telephone Encounter (Signed)
Pt called needs refill on hyrdocodone Please call when ready Pt is out of meds

## 2013-06-19 NOTE — Telephone Encounter (Signed)
Ok to refill,  printed rx  

## 2013-06-19 NOTE — Telephone Encounter (Signed)
Ok to fill 

## 2013-07-02 ENCOUNTER — Other Ambulatory Visit: Payer: Self-pay | Admitting: *Deleted

## 2013-07-02 DIAGNOSIS — F409 Phobic anxiety disorder, unspecified: Secondary | ICD-10-CM

## 2013-07-02 DIAGNOSIS — F5105 Insomnia due to other mental disorder: Principal | ICD-10-CM

## 2013-07-02 MED ORDER — TRAZODONE HCL 50 MG PO TABS: 25.0000 mg | ORAL_TABLET | Freq: Every evening | ORAL | Status: DC | PRN

## 2013-07-02 NOTE — Telephone Encounter (Signed)
Ok to fill 

## 2013-07-10 ENCOUNTER — Telehealth: Payer: Self-pay | Admitting: Internal Medicine

## 2013-07-10 MED ORDER — HYDROCODONE-ACETAMINOPHEN 5-325 MG PO TABS: 1.0000 | ORAL_TABLET | Freq: Four times a day (QID) | ORAL | Status: DC | PRN

## 2013-07-10 NOTE — Telephone Encounter (Signed)
Ok to refill,  printed rx  

## 2013-07-10 NOTE — Telephone Encounter (Signed)
Notified patient script ready for pick up placed up front

## 2013-07-10 NOTE — Telephone Encounter (Signed)
Last OV 3/15 ok to fill? 

## 2013-07-10 NOTE — Telephone Encounter (Signed)
Pt called needs to get refill on hydrocodone Pt is out of meds Please call when ready for pick up

## 2013-07-31 ENCOUNTER — Encounter (INDEPENDENT_AMBULATORY_CARE_PROVIDER_SITE_OTHER): Payer: Self-pay

## 2013-07-31 ENCOUNTER — Ambulatory Visit (INDEPENDENT_AMBULATORY_CARE_PROVIDER_SITE_OTHER): Payer: 59 | Admitting: Adult Health

## 2013-07-31 ENCOUNTER — Encounter: Payer: Self-pay | Admitting: Adult Health

## 2013-07-31 VITALS — BP 130/85 | HR 72 | Temp 98.0°F | Resp 14 | Ht 65.5 in | Wt 160.0 lb

## 2013-07-31 DIAGNOSIS — M546 Pain in thoracic spine: Secondary | ICD-10-CM

## 2013-07-31 MED ORDER — HYDROCODONE-ACETAMINOPHEN 5-325 MG PO TABS: 1.0000 | ORAL_TABLET | Freq: Four times a day (QID) | ORAL | Status: DC | PRN

## 2013-07-31 MED ORDER — HYOSCYAMINE SULFATE 0.125 MG PO TABS: 0.1250 mg | ORAL_TABLET | Freq: Two times a day (BID) | ORAL | Status: DC

## 2013-07-31 NOTE — Progress Notes (Signed)
Pre visit review using our clinic review tool, if applicable. No additional management support is needed unless otherwise documented below in the visit note. 

## 2013-07-31 NOTE — Patient Instructions (Signed)
  [facility] go [facility] to have your back xray.  I am referring you to physical therapy.

## 2013-07-31 NOTE — Progress Notes (Signed)
Subjective:    Patient ID: Francetta FoundVictoria Boniface, female    DOB: 08-14-1955, 58 y.o.   MRN: [id number]  HPI Patient is a pleasant 58 year old female who presents to clinic with thoracic back pain, bilateral shoulder and right scapula pain.  Patient has history of lumbar degenerative disc disease and is status post cervical fusion. She was seen for this same problem in July 2013. At this time the pain was radiating to mid axillary line. She underwent cholecystectomy; however, this did not improve her symptoms. She takes tramadol and is also on hydrocodone. She has had physical therapy in the past with some relief of her symptoms. Thoracic x-ray from July 2013 shows scoliosis, DDD and degenerative spondylosis.  Patient is also exhibiting bilateral shoulder pain. She believes that this may be secondary to her cervicalgia s/p fusion. She has good range of motion of bilateral arms. Slight decreased ROM with abduction of the arm anteriorly. Full range of motion with abduction of the arms laterally.    Past Medical History  Diagnosis Date  . Asthma   . Schatzki's ring     dialated 3 times by Markham JordanElliot      Past Surgical History  Procedure Laterality Date  . Bunionectomy      left foot   . Cesarean section    . Cholecystectomy  June 2013     Family History  Problem Relation Age of Onset  . Breast cancer Maternal Aunt   . Colon cancer Neg Hx   . Ovarian cancer Neg Hx   . Diabetes Mother   . Hypertension Mother   . Heart disease Father      History   Social History  . Marital Status: Married    Spouse Name: N/A    Number of Children: N/A  . Years of Education: N/A   Occupational History  . Not on file.   Social History Main Topics  . Smoking status: Never Smoker   . Smokeless tobacco: Never Used  . Alcohol Use: No  . Drug Use: No  . Sexual Activity: Yes   Other Topics Concern  . Not on file   Social History Narrative   Lives with spouse, son, and stepson who has serious  psychiatric issues and alcohol abuse    Review of Systems  Musculoskeletal: Positive for back pain (thoracic spine, scapula on the right) and neck stiffness (decreased ROM 2/2 cervical fusion). Negative for gait problem and neck pain. Arthralgias: bilateral shoulder pain.  Neurological: Negative for weakness and numbness.  All other systems reviewed and are negative.      Objective:   Physical Exam  Constitutional: She is oriented to person, place, and time. She appears well-developed and well-nourished. No distress.  HENT:  Head: Normocephalic and atraumatic.  Eyes: Conjunctivae and EOM are normal.  Neck: Normal range of motion. Neck supple.  Cardiovascular: Normal rate and regular rhythm.   Pulmonary/Chest: Effort normal. No respiratory distress.  Musculoskeletal: She exhibits tenderness (with palpation over right scapula, trapezius muscle). She exhibits no edema.  On exam, there is a slight elevation on the right side. This coincides with the scoliosis reported on previous xray. Full ROM with abduction of arms laterally. The rom is decreased with abduction of arms anteriorly to approximately 70-80  Neurological: She is alert and oriented to person, place, and time. She has normal reflexes. Coordination normal.  Skin: Skin is warm and dry.  Psychiatric: She has a normal mood and affect. Her behavior is normal. Judgment and  thought content normal.      Assessment & Plan:   1. Back pain, thoracic Suspect that her back pain may be from worsening DDD. I will send her for xray and compare with 2013. Refer to physical therapy. Consider referral to Ortho. Norco refilled. Note greater than 30 min were spent in face to face communication with patient in the assessment, planning, reviewing previous provider notes and films and in the implementation of care pertaining to this problem. - DG Thoracic Spine W/Swimmers; Future - Ambulatory referral to Physical Therapy

## 2013-08-19 ENCOUNTER — Ambulatory Visit (INDEPENDENT_AMBULATORY_CARE_PROVIDER_SITE_OTHER)
Admission: RE | Admit: 2013-08-19 | Discharge: 2013-08-19 | Disposition: A | Discharge status: Home / Self Care | Payer: 59 | Source: Ambulatory Visit | Attending: Adult Health | Admitting: Adult Health

## 2013-08-19 ENCOUNTER — Telehealth: Payer: Self-pay | Admitting: Internal Medicine

## 2013-08-19 DIAGNOSIS — M546 Pain in thoracic spine: Secondary | ICD-10-CM

## 2013-08-19 NOTE — Telephone Encounter (Signed)
You placed a referral for PT for patient, Patient declined to schedule appt with PT x2. Patient does not want PT at this time.

## 2013-08-19 NOTE — Telephone Encounter (Signed)
States they received referral from Dr. Darrick Huntsman which was scheduled for last week.  Pt called to cancel and stated she did not want PT.  Sherri Lee states they have received another referral for this pt from R. Rey but the pt still does not want to come.  Pt will not be scheduled.

## 2013-08-21 ENCOUNTER — Telehealth: Payer: Self-pay | Admitting: Internal Medicine

## 2013-08-21 MED ORDER — HYDROCODONE-ACETAMINOPHEN 5-325 MG PO TABS: 1.0000 | ORAL_TABLET | Freq: Four times a day (QID) | ORAL | Status: DC | PRN

## 2013-08-21 NOTE — Telephone Encounter (Signed)
She is requesting an early refill,  And has refused PT. I think she has real pain so I am refilling, but please  Find out if Has she had the x rays that Raquel ordered?

## 2013-08-21 NOTE — Telephone Encounter (Signed)
Patient request a rx on hydrocodone. Pt stated that she only has 1 pill left.Please advise pt/msn

## 2013-08-21 NOTE — Telephone Encounter (Signed)
Ok refill? 

## 2013-08-22 NOTE — Telephone Encounter (Addendum)
Pt had xrays, viewed by Dr. Darrick Huntsman this morning. Left message, notifying ready for pickup

## 2013-09-17 ENCOUNTER — Other Ambulatory Visit: Payer: Self-pay | Admitting: Internal Medicine

## 2013-09-17 ENCOUNTER — Telehealth: Payer: Self-pay

## 2013-09-17 MED ORDER — HYDROCODONE-ACETAMINOPHEN 5-325 MG PO TABS: 1.0000 | ORAL_TABLET | Freq: Four times a day (QID) | ORAL | Status: DC | PRN

## 2013-09-17 NOTE — Telephone Encounter (Signed)
Patient left a message on my voicemail requesting a refill on her Hyrocodone 5-325mg . She is a patient of Dr. Melina Schoolsullo's.

## 2013-09-17 NOTE — Addendum Note (Signed)
Addended by: Sherlene ShamsULLO, Machell Wirthlin L on: 09/17/2013 05:12 PM   Modules accepted: Orders

## 2013-09-17 NOTE — Telephone Encounter (Signed)
Ok to refill on July 10 ,.   rx printed

## 2013-09-17 NOTE — Telephone Encounter (Signed)
Last filled 08/21/13 ok to fill?

## 2013-09-18 NOTE — Telephone Encounter (Signed)
Patient notified and script placed at front desk. 

## 2013-09-18 NOTE — Telephone Encounter (Signed)
Last refill 6.16.15, last OV 5.20.15, no future Ov.  Please advise refill

## 2013-09-19 NOTE — Telephone Encounter (Signed)
Ok to refill,  Refill sent  

## 2013-10-04 ENCOUNTER — Telehealth: Payer: Self-pay | Admitting: *Deleted

## 2013-10-04 NOTE — Telephone Encounter (Signed)
Chart reviewed for DM bundle. Scheduled pt appt with Tullo 10/30/13, lab appt sch 10/28/13, labs already ordered.

## 2013-10-15 ENCOUNTER — Telehealth: Payer: Self-pay | Admitting: Internal Medicine

## 2013-10-15 NOTE — Telephone Encounter (Signed)
Pt needs refill for hydrocodone 5-325 mg. Please call pt when ready.

## 2013-10-15 NOTE — Telephone Encounter (Signed)
Ok to fill 

## 2013-10-16 MED ORDER — HYDROCODONE-ACETAMINOPHEN 5-325 MG PO TABS: 1.0000 | ORAL_TABLET | Freq: Four times a day (QID) | ORAL | Status: DC | PRN

## 2013-10-16 NOTE — Telephone Encounter (Signed)
Refill printed,  Aug 10th ok to fill

## 2013-10-16 NOTE — Telephone Encounter (Signed)
Left message for patient script ready for pick up. Placed script at front desk.

## 2013-10-28 ENCOUNTER — Other Ambulatory Visit (INDEPENDENT_AMBULATORY_CARE_PROVIDER_SITE_OTHER): Payer: 59

## 2013-10-28 DIAGNOSIS — E119 Type 2 diabetes mellitus without complications: Secondary | ICD-10-CM

## 2013-10-28 DIAGNOSIS — E559 Vitamin D deficiency, unspecified: Secondary | ICD-10-CM

## 2013-10-28 LAB — COMPREHENSIVE METABOLIC PANEL
ALT: 33 U/L (ref 0–35)
AST: 32 U/L (ref 0–37)
Albumin: 3.5 g/dL (ref 3.5–5.2)
Alkaline Phosphatase: 64 U/L (ref 39–117)
BUN: 10 mg/dL (ref 6–23)
CO2: 28 mEq/L (ref 19–32)
Calcium: 9.3 mg/dL (ref 8.4–10.5)
Chloride: 106 mEq/L (ref 96–112)
Creatinine, Ser: 0.7 mg/dL (ref 0.4–1.2)
GFR: 88.17 mL/min (ref >60.00)
Glucose, Bld: 87 mg/dL (ref 70–99)
Potassium: 4 mEq/L (ref 3.5–5.1)
Sodium: 141 mEq/L (ref 135–145)
Total Bilirubin: 0.7 mg/dL (ref 0.2–1.2)
Total Protein: 6.1 g/dL (ref 6.0–8.3)

## 2013-10-28 LAB — LIPID PANEL
Cholesterol: 178 mg/dL (ref 0–200)
HDL: 73.2 mg/dL (ref >39.00)
LDL Cholesterol: 95 mg/dL (ref 0–99)
NonHDL: 104.8
Total CHOL/HDL Ratio: 2
Triglycerides: 49 mg/dL (ref 0.0–149.0)
VLDL: 9.8 mg/dL (ref 0.0–40.0)

## 2013-10-28 LAB — HEMOGLOBIN A1C: Hgb A1c MFr Bld: 6.1 % (ref 4.6–6.5)

## 2013-10-29 LAB — VITAMIN D 25 HYDROXY (VIT D DEFICIENCY, FRACTURES): Vit D, 25-Hydroxy: 51 ng/mL (ref 30–89)

## 2013-10-30 ENCOUNTER — Ambulatory Visit (INDEPENDENT_AMBULATORY_CARE_PROVIDER_SITE_OTHER): Payer: 59 | Admitting: Internal Medicine

## 2013-10-30 DIAGNOSIS — Z0289 Encounter for other administrative examinations: Secondary | ICD-10-CM

## 2013-10-30 DIAGNOSIS — E119 Type 2 diabetes mellitus without complications: Secondary | ICD-10-CM

## 2013-11-01 NOTE — Progress Notes (Signed)
Patient ID: Sherri Lee, female   DOB: 01/21/1956, 58 y.o.   MRN: [id number] Patient called 15 minutes after appt time with car problems.Marland Kitchen. rescheduled

## 2013-11-13 ENCOUNTER — Ambulatory Visit: Payer: 59 | Admitting: Adult Health

## 2013-11-14 ENCOUNTER — Telehealth: Payer: Self-pay | Admitting: Internal Medicine

## 2013-11-14 ENCOUNTER — Other Ambulatory Visit: Payer: Self-pay | Admitting: Internal Medicine

## 2013-11-14 ENCOUNTER — Ambulatory Visit: Payer: 59 | Admitting: Adult Health

## 2013-11-14 MED ORDER — HYDROCODONE-ACETAMINOPHEN 5-325 MG PO TABS: 1.0000 | ORAL_TABLET | Freq: Four times a day (QID) | ORAL | Status: DC | PRN

## 2013-11-14 NOTE — Telephone Encounter (Signed)
No early refills,  rx printed for refill sept 10

## 2013-11-14 NOTE — Telephone Encounter (Signed)
Refill? Last visit was acute 5/15, cancelled 10/30/13 appt

## 2013-11-14 NOTE — Telephone Encounter (Signed)
Ok to refill,  printed rx  

## 2013-11-14 NOTE — Addendum Note (Signed)
Addended by: [name] on: 11/14/2013 01:17 PM   Modules accepted: Orders

## 2013-11-14 NOTE — Telephone Encounter (Signed)
Patient called for refill on Hydrocodone patient could not make appointment on 11/13/13 or 11/14/13 due to provider cancelled. Last filled 10/21/13 please advise.

## 2013-11-15 NOTE — Telephone Encounter (Signed)
Patient notified script ready for pick up and placed at front desk 

## 2013-11-18 ENCOUNTER — Other Ambulatory Visit: Payer: Self-pay | Admitting: Internal Medicine

## 2013-11-28 ENCOUNTER — Other Ambulatory Visit: Payer: Self-pay | Admitting: *Deleted

## 2013-11-28 MED ORDER — METOPROLOL SUCCINATE ER 50 MG PO TB24: ORAL_TABLET | ORAL | Status: DC

## 2013-12-13 ENCOUNTER — Telehealth: Payer: Self-pay | Admitting: Internal Medicine

## 2013-12-13 NOTE — Telephone Encounter (Signed)
The patient called stated that she was not aware of an appointment on 8.19.15. She also stated that we called her she did not call us to scheduled this appointment and she feels she should not be charged for not coming . Please advise.

## 2013-12-16 NOTE — Telephone Encounter (Signed)
FYI

## 2013-12-16 NOTE — Telephone Encounter (Signed)
She has to be seen every 6 months for follow up on chronic pain requiring narcotcis refills and history of diabetes.  This was her 6 month follow up. It doesn't matter who called who. You can waive it this time but there will be no more refills on controlled substances until she  is seen.

## 2013-12-17 NOTE — Telephone Encounter (Signed)
Left message for patient to return call gave information to ByronNancy concerning No show fee and Harriett Sineancy is forwarding to the proper channel.

## 2013-12-17 NOTE — Telephone Encounter (Signed)
Patient notified one time waiver in process,  For no show fee, patient was also advised she needs to keep 6 month appointment for Diabetes and chronic pain issues.

## 2013-12-20 ENCOUNTER — Telehealth: Payer: Self-pay | Admitting: Internal Medicine

## 2013-12-20 DIAGNOSIS — M546 Pain in thoracic spine: Secondary | ICD-10-CM

## 2013-12-20 MED ORDER — HYDROCODONE-ACETAMINOPHEN 5-325 MG PO TABS: 1.0000 | ORAL_TABLET | Freq: Four times a day (QID) | ORAL | Status: DC | PRN

## 2013-12-20 NOTE — Telephone Encounter (Signed)
Ok to refill,  printed rx  

## 2013-12-20 NOTE — Telephone Encounter (Signed)
Patient notified script placed at front desk, reminded patient must keep appointment for further refills.

## 2013-12-20 NOTE — Telephone Encounter (Signed)
Patient requesting refill on Hydrocodone, patient stated she was not aware she needed to be seen every 6 months for chronic pain, Patient has an appointment 01/20/14.

## 2014-01-08 ENCOUNTER — Other Ambulatory Visit: Payer: Self-pay | Admitting: Internal Medicine

## 2014-01-09 ENCOUNTER — Other Ambulatory Visit: Payer: Self-pay | Admitting: Internal Medicine

## 2014-01-20 ENCOUNTER — Ambulatory Visit (INDEPENDENT_AMBULATORY_CARE_PROVIDER_SITE_OTHER): Payer: 59 | Admitting: Internal Medicine

## 2014-01-20 ENCOUNTER — Ambulatory Visit (INDEPENDENT_AMBULATORY_CARE_PROVIDER_SITE_OTHER): Payer: 59 | Admitting: *Deleted

## 2014-01-20 ENCOUNTER — Encounter: Payer: Self-pay | Admitting: Internal Medicine

## 2014-01-20 VITALS — BP 138/90 | HR 85 | Temp 98.2°F | Resp 16 | Ht 65.5 in | Wt 164.0 lb

## 2014-01-20 DIAGNOSIS — M546 Pain in thoracic spine: Secondary | ICD-10-CM

## 2014-01-20 DIAGNOSIS — Z9884 Bariatric surgery status: Secondary | ICD-10-CM

## 2014-01-20 DIAGNOSIS — E119 Type 2 diabetes mellitus without complications: Secondary | ICD-10-CM

## 2014-01-20 DIAGNOSIS — Z23 Encounter for immunization: Secondary | ICD-10-CM

## 2014-01-20 DIAGNOSIS — K915 Postcholecystectomy syndrome: Secondary | ICD-10-CM

## 2014-01-20 MED ORDER — HYDROCODONE-ACETAMINOPHEN 5-325 MG PO TABS: 1.0000 | ORAL_TABLET | Freq: Four times a day (QID) | ORAL | Status: DC | PRN

## 2014-01-20 MED ORDER — DICYCLOMINE HCL 20 MG PO TABS: 20.0000 mg | ORAL_TABLET | Freq: Four times a day (QID) | ORAL | Status: DC

## 2014-01-20 NOTE — Patient Instructions (Signed)
I am referring you for a thoracic spine MRI and a Pain clinic evaluation    I am prescribing a medication for irritable bowel called Bentyl you can take 20 minutes before eating  You are gaining too much weight .  I want you to get back on your  LOW CARB DIET  Stop using nondairy creamers.  Go back to half n half  I want you to lose 25 lbs over the next six months with a low glycemic index diet and regular exercise (30 minutes of cardio 5 days per week is your goal)  This is  my version of a  "Low GI"  Diet:  It will still lower your blood sugars and allow you to lose 4 to 8  lbs  per month if you follow it carefully.  Your goal with exercise is a minimum of 30 minutes of aerobic exercise 5 days per week   All of the foods can be found at grocery stores and in bulk at [employer].  The Atkins protein bars and shakes are available in more varieties at Target, WalMart and [employer]'s Foods.     7 AM Breakfast:  Choose from the following:  Low carbohydrate Protein  Shakes (I recommend the EAS AdvantEdge "Carb Control" shakes, Atkins,  Or Premier Protein vanilla shakes)   Arnold's "Sandwhich Thin"toasted  w/ peanut butter (no jelly: about 20 net carbs  "Bagel Thin" with cream cheese and salmon: about 20 carbs   a scrambled egg/bacon/cheese burrito made with Mission's "carb balance" whole wheat tortilla  (about 10 net carbs )  A slice of home made fritatta (egg based dish without a crust:  google it)    Avoid cereal and bananas, oatmeal and cream of wheat and grits. They are loaded with carbohydrates!   10 AM: high protein snack  Protein bar by Atkins (the snack size, under 200 cal, usually < 6 net carbs).    A stick of cheese:  Around 1 carb,  100 cal     Dannon Light n Fit AustriaGreek Yogurt  (80 cal, 8 carbs)  Other so called "protein bars" and Greek yogurts tend to be loaded with carbohydrates.  Remember, in food advertising, the word "energy" is synonymous for " carbohydrate."  Lunch:   A Sandwich  using the bread choices listed, Can use any  Eggs,  lunchmeat, grilled meat or canned tuna), avocado, regular mayo/mustard  and cheese.  A Salad using blue cheese, ranch,  Goddess or vinagrette,  No croutons or "confetti" and no "candied nuts" but regular nuts OK.   No pretzels or chips.  Pickles and miniature sweet peppers are a good low carb alternative that provide a "crunch"  The bread is the only source of carbohydrate in a sandwich and  can be decreased by trying some of these alternatives to traditional loaf bread  Joseph's makes a pita bread and a flat bread that are 50 cal and 4 net carbs available at BJs and WalMart.  This can be toasted to use with hummous as well  Toufayan makes a low carb flatbread that's 100 cal and 9 net carbs available at [employer] and [employer]  Mission makes 2 sizes of  Low carb whole wheat tortilla  (The large one is 210 cal and 6 net carbs)  Flat Out makes flatbreads that are low carb as well  Avoid "Low fat dressings, as well as Reyne DumasCatalina and [address] dressings They are loaded with sugar!   3  PM/ Mid day  Snack:  Consider  1 ounce of  almonds, walnuts, pistachios, pecans, peanuts,  Macadamia nuts or a nut medley.  Avoid "granola"; the dried cranberries and raisins are loaded with carbohydrates. Mixed nuts as long as there are no raisins,  cranberries or dried fruit.    Try the prosciutto/mozzarella cheese sticks by Fiorruci  In deli /backery section   High protein   To avoid overindulging in snacks: Try drinking a glass of unsweeted almond/coconut milk  Or a cup of coffee with your Atkins chocolate bar to keep you from having 3!!!   Pork rinds!  Yes Pork Rinds        6 PM  Dinner:     Meat/fowl/fish with a green salad, and either broccoli, cauliflower, green beans, spinach, brussel sprouts or  Lima beans. DO NOT BREAD THE PROTEIN!!      There is a low carb pasta by Dreamfield's that is acceptable and tastes great: only 5 digestible carbs/serving.( All  grocery stores but BJs carry it )  Try Kai LevinsMichel Angelo's chicken piccata or chicken or eggplant parm over low carb pasta.(Lowes and BJs)   Clifton CustardAaron Sanchez's "Carnitas" (pulled pork, no sauce,  0 carbs) or his beef pot roast to make a dinner burrito (at BJ's)  Pesto over low carb pasta (bj's sells a good quality pesto in the center refrigerated section of the deli   Try satueeing  Roosvelt HarpsBok Choy with mushroooms  Whole wheat pasta is still full of digestible carbs and  Not as low in glycemic index as Dreamfield's.   Brown rice is still rice,  So skip the rice and noodles if you eat Congohinese or [place] (or at least limit to 1/2 cup)  9 PM snack :   Breyer's "low carb" fudgsicle or  ice cream bar (Carb Smart line), or  Weight Watcher's ice cream bar , or another "no sugar added" ice cream;  a serving of fresh berries/cherries with whipped cream   Cheese or DANNON'S LlGHT N FIT GREEK YOGURT or the Oikos greek yogurt   8 ounces of [place] unsweetened almond/cococunut milk  Cheese and crackers (using WASA crackers,  They are low carb) or peanut butter on low carb crackers or pita bread     Avoid bananas, pineapple, grapes  and watermelon on a regular basis because they are high in sugar.  THINK OF THEM AS DESSERT  Remember that snack Substitutions should be less than 10 NET carbs per serving and meals should be < 25 net carbs. Remember that carbohydrates from fiber do not affect blood sugar, so you can  subtract fiber grams to get the "net carbs " of any particular food item.

## 2014-01-20 NOTE — Progress Notes (Signed)
Patient ID: Sherri Lee, female   DOB: December 05, 1955, 58 y.o.   MRN: [id number]  Patient Active Problem List   Diagnosis Date Noted  . Acute recurrent maxillary sinusitis 06/01/2013  . Insomnia due to anxiety and fear 02/26/2013  . Viral URI 02/26/2013  . Stye 02/26/2013  . Post-cholecystectomy syndrome 08/22/2012  . Other and unspecified ovarian cyst 08/09/2012  . Alopecia areata 07/11/2012  . Cervicalgia 07/11/2012  . Thoracic spine pain 10/04/2011  . Hypertension 05/10/2011  . Diabetes mellitus, controlled 05/10/2011  . Status post gastric bypass for obesity 05/10/2011    Subjective:  CC:   Chief Complaint  Patient presents with  . Follow-up    medication refills    HPI:   Sherri HighlandVictoria M Lee is a 58 y.o. female who presents for  Follow upon chronic issues including diarrhea,  chronic low back pain secondary to degenerative disk disease, requiring daily  use of narcotics, obesity s/p gastric bypass surgery,  and benzodiazepine use  secondary to GAD with insomnia.    Her back pain occurs on a Has pain on a daily basis, chronic for several years. There has been no escalation of use of narcotics. Her pain does not radiate down either leg.  She is not exercising  Regularly .    Continues to have multiple loose stools on a daily basis and uses immodium every other day.  The symptoms were aggravated by her cholecystectomy,   No GI folllow up in over a year Has seen Dr Mechele CollinElliott in the past and was diagnosed with IBS several years ago   No prior trial of benty;  GAD. Marland Kitchen. Using clonazepam up to bid for panic attacks associated with emtional stress of recent separation from husband since March,  No domestic violence . The medication is prescribed by her psychiatrist, [place] Janeece Rig[place]Su in [place] .  Obeisty:  She is s/p bypass 3 years ago and has been steadily gaining weight over the past year.  She is not exercising or following a low glycemic index diet.  Has not seen the bariatric surgeon in  over a year and does not attend any support group for overeaters.    Past Medical History  Diagnosis Date  . Asthma   . Schatzki's ring     dialated 3 times by Markham JordanElliot     Past Surgical History  Procedure Laterality Date  . Bunionectomy      left foot   . Cesarean section    . Cholecystectomy  June 2013       The following portions of the patient's history were reviewed and updated as appropriate: Allergies, current medications, and problem list.    Review of Systems:   Patient denies headache, fevers, malaise, unintentional weight loss, skin rash, eye pain, sinus congestion and sinus pain, sore throat, dysphagia,  hemoptysis , cough, dyspnea, wheezing, chest pain, palpitations, orthopnea, edema, abdominal pain, nausea, melena, diarrhea, constipation, flank pain, dysuria, hematuria, urinary  Frequency, nocturia, numbness, tingling, seizures,  Focal weakness, Loss of consciousness,  Tremor, insomnia, depression, anxiety, and suicidal ideation.     History   Social History  . Marital Status: Married    Spouse Name: N/A    Number of Children: N/A  . Years of Education: N/A   Occupational History  . Not on file.   Social History Main Topics  . Smoking status: Never Smoker   . Smokeless tobacco: Never Used  . Alcohol Use: No  . Drug Use: No  . Sexual Activity:  Yes   Other Topics Concern  . Not on file   Social History Narrative   Lives with spouse, son, and stepson who has serious psychiatric issues and alcohol abuse    Objective:  Filed Vitals:   01/20/14 1653  BP: 138/90  Pulse: 85  Temp: 98.2 F (36.8 C)  Resp: 16     General appearance: alert, cooperative and appears stated age Ears: normal TM's and external ear canals both ears Throat: lips, mucosa, and tongue normal; teeth and gums normal Neck: no adenopathy, no carotid bruit, supple, symmetrical, trachea midline and thyroid not enlarged, symmetric, no tenderness/mass/nodules Back: symmetric, no  curvature. ROM normal. No CVA tenderness. Lungs: clear to auscultation bilaterally Heart: regular rate and rhythm, S1, S2 normal, no murmur, click, rub or gallop Abdomen: soft, non-tender; bowel sounds normal; no masses,  no organomegaly Pulses: 2+ and symmetric Skin: Skin color, texture, turgor normal. No rashes or lesions Lymph nodes: Cervical, supraclavicular, and axillary nodes normal.  Assessment and Plan:  Post-cholecystectomy syndrome Vs IBS>  Trial of Bentyl for postprandial cramping and loose stools. ,  If no improvement  Trial of Questran.   Diabetes mellitus, controlled Well-controlled on diet since bariatric surgery. She is not  up-to-date on eye exams but  foot exam is normal.   Lipid panel was normal in August. Lab Results  Component Value Date   HGBA1C 6.1 10/28/2013   Lab Results  Component Value Date   CHOL 178 10/28/2013   HDL 73.20 10/28/2013   LDLCALC 95 10/28/2013   TRIG 49.0 10/28/2013   CHOLHDL 2 10/28/2013      Thoracic spine pain Discussed need for definitive management due to chronic use of narcotics .  MRI thoracic spine to be ordered and Pain clinic referral if indiciated  Status post gastric bypass for obesity I have addressed  Her gradual increase in BMI and recommended wt loss of 10% of body weight over the next 6 months using a low glycemic index diet and regular exercise a minimum of 5 days per week.   A total of 30 minutes of face to face time was spent with patient more than half of which was spent in counselling and coordination of care    Updated Medication List Outpatient Encounter Prescriptions as of 01/20/2014  Medication Sig  . albuterol (PROVENTIL HFA;VENTOLIN HFA) 108 (90 BASE) MCG/ACT inhaler Inhale 2 puffs into the lungs every 6 (six) hours as needed for wheezing or shortness of breath.  . Azelastine-Fluticasone (DYMISTA) 137-50 MCG/ACT SUSP Place 2 Squirts into the nose daily. In each nostril  . Biotin 1000 MCG tablet Take 1,000  mcg by mouth 3 (three) times daily.  . calcium carbonate (OS-CAL) 600 MG TABS Take 600 mg by mouth 2 (two) times daily with a meal.  . clonazePAM (KLONOPIN) 1 MG tablet Take 1 mg by mouth 2 (two) times daily as needed.  . gabapentin (NEURONTIN) 100 MG capsule TAKE ONE CAPSULE BY MOUTH 3 TIMES A DAY  . gabapentin (NEURONTIN) 100 MG capsule TAKE ONE CAPSULE BY MOUTH 3 TIMES A DAY  . HYDROcodone-acetaminophen (NORCO/VICODIN) 5-325 MG per tablet Take 1 tablet by mouth every 6 (six) hours as needed for moderate pain.  . metoprolol succinate (TOPROL-XL) 50 MG 24 hr tablet TAKE 1 TABLET (50 MG TOTAL) BY MOUTH DAILY WITH OR IMMEDIATELY FOLLOWING A MEAL.  . Multiple Vitamin (MULTIVITAMIN) tablet Take 1 tablet by mouth daily. Bariatric Vitamin  . sertraline (ZOLOFT) 100 MG tablet Take 100 mg by  mouth daily.  . traMADol (ULTRAM) 50 MG tablet TAKE 1 TABLET BY MOUTH EVERY 6 HOURS AS NEEDED FOR PAIN  . traZODone (DESYREL) 50 MG tablet Take 0.5-1 tablets (25-50 mg total) by mouth at bedtime as needed for sleep.  . vitamin B-12 (CYANOCOBALAMIN) 1000 MCG tablet Take 3,000 mcg by mouth daily.  . [DISCONTINUED] HYDROcodone-acetaminophen (NORCO/VICODIN) 5-325 MG per tablet Take 1 tablet by mouth every 6 (six) hours as needed for moderate pain.  . [DISCONTINUED] HYDROcodone-acetaminophen (NORCO/VICODIN) 5-325 MG per tablet Take 1 tablet by mouth every 6 (six) hours as needed for moderate pain.  . [DISCONTINUED] HYDROcodone-acetaminophen (NORCO/VICODIN) 5-325 MG per tablet Take 1 tablet by mouth every 6 (six) hours as needed for moderate pain.  . [DISCONTINUED] HYDROcodone-acetaminophen (NORCO/VICODIN) 5-325 MG per tablet Take 1 tablet by mouth every 6 (six) hours as needed for moderate pain.  Marland Kitchen. amoxicillin-clavulanate (AUGMENTIN) 875-125 MG per tablet Take 1 tablet by mouth 2 (two) times daily.  . chlorpheniramine-HYDROcodone (TUSSIONEX) 10-8 MG/5ML LQCR Take 5 mLs by mouth at bedtime as needed.  . dicyclomine (BENTYL)  20 MG tablet Take 1 tablet (20 mg total) by mouth every 6 (six) hours.  Marland Kitchen. erythromycin [facility](ROMYCIN) ophthalmic ointment Apply to left eyelid 4 times daily  . estradiol-norethindrone (COMBIPATCH) 0.05-0.14 MG/DAY Place 1 patch onto the skin 2 (two) times a week.  . folic acid (FOLVITE) 1 MG tablet Take 1 mg by mouth daily.  . hyoscyamine (LEVSIN, ANASPAZ) 0.125 MG tablet Take 1 tablet (0.125 mg total) by mouth 2 (two) times daily.  . montelukast (SINGULAIR) 10 MG tablet Take 10 mg by mouth at bedtime.  . predniSONE (STERAPRED UNI-PAK) 10 MG tablet 6 tablets on Day 1 , then reduce by 1 tablet daily until gone  . predniSONE (STERAPRED UNI-PAK) 10 MG tablet 6 tablets on Day 1 , then reduce by 1 tablet daily until gone  . promethazine (PHENERGAN) 12.5 MG tablet Take 12.5 mg by mouth every 6 (six) hours as needed for nausea.  Marland Kitchen. spironolactone (ALDACTONE) 12.5 mg TABS Take 0.5 tablets (12.5 mg total) by mouth daily.     Orders Placed This Encounter  Procedures  . MR Thoracic Spine Wo Contrast    No Follow-up on file.

## 2014-01-20 NOTE — Progress Notes (Signed)
Pre-visit discussion using our clinic review tool. No additional management support is needed unless otherwise documented below in the visit note.  

## 2014-01-22 NOTE — Assessment & Plan Note (Signed)
I have addressed  Her gradual increase in BMI and recommended wt loss of 10% of body weight over the next 6 months using a low glycemic index diet and regular exercise a minimum of 5 days per week.

## 2014-01-22 NOTE — Assessment & Plan Note (Addendum)
Well-controlled on diet since bariatric surgery. She is not  up-to-date on eye exams but  foot exam is normal.   Lipid panel was normal in August. Lab Results  Component Value Date   HGBA1C 6.1 10/28/2013   Lab Results  Component Value Date   CHOL 178 10/28/2013   HDL 73.20 10/28/2013   LDLCALC 95 10/28/2013   TRIG 49.0 10/28/2013   CHOLHDL 2 10/28/2013

## 2014-01-22 NOTE — Assessment & Plan Note (Signed)
Discussed need for definitive management due to chronic use of narcotics .  MRI thoracic spine to be ordered and Pain clinic referral if indiciated

## 2014-01-22 NOTE — Assessment & Plan Note (Addendum)
Vs IBS>  Trial of Bentyl for postprandial cramping and loose stools. ,  If no improvement  Trial of Questran.

## 2014-01-30 ENCOUNTER — Ambulatory Visit: Payer: Self-pay | Admitting: Internal Medicine

## 2014-02-03 ENCOUNTER — Telehealth: Payer: Self-pay | Admitting: Internal Medicine

## 2014-02-03 DIAGNOSIS — M546 Pain in thoracic spine: Secondary | ICD-10-CM

## 2014-02-03 NOTE — Telephone Encounter (Signed)
Left message, notifying patient. [facility]vised [facility] with appointment details.

## 2014-02-03 NOTE — Assessment & Plan Note (Signed)
With radiation o right axillary line.  MRI shows mild disk degeneration from T3 to T7 without disk herniation, pinched nerves or spinal stenosis

## 2014-02-03 NOTE — Telephone Encounter (Signed)
MRI shows mild disk degeneration from T3 to T7 without disk herniation, pinched nerves or spinal stenosis . I am concerned about your recurrent need for chronic vicodin use  with such mild changes on MRI  And would like you to see the Pain clinic . Referral is in process.

## 2014-02-20 ENCOUNTER — Encounter: Payer: Self-pay | Admitting: Internal Medicine

## 2014-02-23 ENCOUNTER — Ambulatory Visit: Payer: Self-pay | Admitting: Emergency Medicine

## 2014-02-23 ENCOUNTER — Emergency Department: Payer: Self-pay | Admitting: Emergency Medicine

## 2014-02-23 LAB — CBC WITH DIFFERENTIAL/PLATELET
Basophil #: 0.1 10*3/uL (ref 0.0–0.1)
Basophil %: 1 %
Eosinophil #: 0.4 10*3/uL (ref 0.0–0.7)
Eosinophil %: 5.8 %
HCT: 33.4 % — ABNORMAL LOW (ref 35.0–47.0)
HGB: 10.3 g/dL — ABNORMAL LOW (ref 12.0–16.0)
Lymphocyte #: 2.2 10*3/uL (ref 1.0–3.6)
Lymphocyte %: 35.7 %
MCH: 26.1 pg (ref 26.0–34.0)
MCHC: 30.9 g/dL — ABNORMAL LOW (ref 32.0–36.0)
MCV: 84 fL (ref 80–100)
Monocyte #: 0.4 x10 3/mm (ref 0.2–0.9)
Monocyte %: 7.1 %
Neutrophil #: 3.1 10*3/uL (ref 1.4–6.5)
Neutrophil %: 50.4 %
Platelet: 225 10*3/uL (ref 150–440)
RBC: 3.95 10*6/uL (ref 3.80–5.20)
RDW: 14.3 % (ref 11.5–14.5)
WBC: 6.2 10*3/uL (ref 3.6–11.0)

## 2014-02-23 LAB — URINALYSIS, COMPLETE
Bacteria: NONE SEEN
Bilirubin,UR: NEGATIVE
Blood: NEGATIVE
Glucose,UR: NEGATIVE mg/dL (ref 0–75)
Ketone: NEGATIVE
Nitrite: NEGATIVE
Ph: 6 (ref 4.5–8.0)
Protein: NEGATIVE
RBC,UR: 2 /HPF (ref 0–5)
Specific Gravity: 1.029 (ref 1.003–1.030)
Squamous Epithelial: <1
WBC UR: 7 /HPF (ref 0–5)

## 2014-02-23 LAB — LIPASE, BLOOD: Lipase: 126 U/L (ref 73–393)

## 2014-02-23 LAB — COMPREHENSIVE METABOLIC PANEL
Albumin: 3.2 g/dL — ABNORMAL LOW (ref 3.4–5.0)
Alkaline Phosphatase: 79 U/L
Anion Gap: 7 (ref 7–16)
BUN: 8 mg/dL (ref 7–18)
Bilirubin,Total: 0.3 mg/dL (ref 0.2–1.0)
Calcium, Total: 8.1 mg/dL — ABNORMAL LOW (ref 8.5–10.1)
Chloride: 109 mmol/L — ABNORMAL HIGH (ref 98–107)
Co2: 26 mmol/L (ref 21–32)
Creatinine: 0.85 mg/dL (ref 0.60–1.30)
EGFR (African American): >60
EGFR (Non-African Amer.): >60
Glucose: 94 mg/dL (ref 65–99)
Osmolality: 281 (ref 275–301)
Potassium: 3.5 mmol/L (ref 3.5–5.1)
SGOT(AST): 36 U/L (ref 15–37)
SGPT (ALT): 46 U/L
Sodium: 142 mmol/L (ref 136–145)
Total Protein: 6.5 g/dL (ref 6.4–8.2)

## 2014-02-25 LAB — URINE CULTURE

## 2014-02-28 ENCOUNTER — Telehealth: Payer: Self-pay | Admitting: Internal Medicine

## 2014-02-28 NOTE — Telephone Encounter (Signed)
Left message for pt to return my call.

## 2014-02-28 NOTE — Telephone Encounter (Signed)
CAN note says patient called yesterday and left message and did not receive a callback. Please ask patietn who she spoke with or what prompt she folllowed.  I reviewed her ER recordfs from weekend visit  And there is no real evidence of UTI on her uA and culture  , so taking an antibiotic "to prevent a UTI" is not advised and will only increase her risk of C dif colitis and other antibiotic associated diarrheas.  However if she is having dysuria, you can call in ciprofloxacin 250 mg twice daily x 5 days

## 2014-03-04 ENCOUNTER — Ambulatory Visit: Payer: Self-pay | Admitting: Pain Medicine

## 2014-03-04 NOTE — Telephone Encounter (Signed)
Left message for pt to return my call, requested call back if she is still needing assistance. Has not returned my call x 2

## 2014-03-05 ENCOUNTER — Ambulatory Visit: Payer: 59 | Admitting: Nurse Practitioner

## 2014-03-12 ENCOUNTER — Ambulatory Visit: Payer: Self-pay | Admitting: Pain Medicine

## 2014-04-02 ENCOUNTER — Telehealth: Payer: Self-pay | Admitting: Internal Medicine

## 2014-04-02 MED ORDER — TRAMADOL HCL 50 MG PO TABS: 50.0000 mg | ORAL_TABLET | Freq: Four times a day (QID) | ORAL | Status: DC | PRN

## 2014-04-02 NOTE — Telephone Encounter (Signed)
Patient called and stated that she has been to pain management for injection and it did help but pain is returning and wanted to know if MD will refill tramadol?

## 2014-04-02 NOTE — Telephone Encounter (Signed)
YES, BUT SHE NEEDS TO LET PAIN MANAGEMENT KNOW THAT I HAVE REFILLED IT

## 2014-04-03 NOTE — Telephone Encounter (Signed)
Script faxed patient notified. 

## 2014-04-10 ENCOUNTER — Ambulatory Visit: Payer: Self-pay | Admitting: Pain Medicine

## 2014-04-23 ENCOUNTER — Ambulatory Visit: Payer: Self-pay | Admitting: Pain Medicine

## 2014-05-07 ENCOUNTER — Other Ambulatory Visit: Payer: Self-pay | Admitting: Internal Medicine

## 2014-05-09 ENCOUNTER — Telehealth: Payer: Self-pay | Admitting: Internal Medicine

## 2014-05-09 MED ORDER — TRAMADOL HCL 50 MG PO TABS: 50.0000 mg | ORAL_TABLET | Freq: Four times a day (QID) | ORAL | Status: DC | PRN

## 2014-05-09 NOTE — Telephone Encounter (Signed)
Ok to refill,  printed rx  

## 2014-05-09 NOTE — Telephone Encounter (Signed)
Patient ask if she can get a refill for her Tramadol, until she she can get in to see Dr Metta Clinesrisp, that will be May 26, 2014,  Please advise

## 2014-05-12 NOTE — Telephone Encounter (Signed)
Script faxed as requested.

## 2014-05-19 ENCOUNTER — Other Ambulatory Visit: Payer: Self-pay | Admitting: Internal Medicine

## 2014-05-26 ENCOUNTER — Ambulatory Visit: Payer: Self-pay | Admitting: Pain Medicine

## 2014-06-12 ENCOUNTER — Other Ambulatory Visit: Payer: Self-pay | Admitting: Internal Medicine

## 2014-06-13 ENCOUNTER — Telehealth: Payer: Self-pay | Admitting: Internal Medicine

## 2014-06-13 MED ORDER — TRAMADOL HCL 50 MG PO TABS: 50.0000 mg | ORAL_TABLET | Freq: Four times a day (QID) | ORAL | Status: DC | PRN

## 2014-06-13 NOTE — Telephone Encounter (Signed)
rx faxed

## 2014-06-13 NOTE — Telephone Encounter (Signed)
Yes , 30 day supply, rx printed

## 2014-06-13 NOTE — Telephone Encounter (Signed)
Patient called and stated that [facility] is [facility] over a request to give Tramadol, patient stated which will not take affect until next visit late April patient is asking for refill until next appointment with pain clinic please advise?

## 2014-06-24 ENCOUNTER — Ambulatory Visit
Admit: 2014-06-24 | Disposition: A | Discharge status: Home / Self Care | Payer: Self-pay | Attending: Pain Medicine | Admitting: Pain Medicine

## 2014-07-01 ENCOUNTER — Other Ambulatory Visit: Payer: Self-pay | Admitting: Internal Medicine

## 2014-07-09 ENCOUNTER — Ambulatory Visit
Admit: 2014-07-09 | Disposition: A | Discharge status: Home / Self Care | Payer: Self-pay | Attending: Pain Medicine | Admitting: Pain Medicine

## 2014-07-16 ENCOUNTER — Telehealth: Payer: Self-pay

## 2014-07-16 NOTE — Telephone Encounter (Signed)
Have patient go to emergency room if symptoms are new. We will consider medication changes at next evaluation as well as consider other interventional procedures. Patient may be candidate for stellate ganglion block. Cervical epidural steroid injection. IV lidocaine infusion. Sphenopalatine seen ganglion block and other procedures. Patient may follow-up with primary care physician at this time as well if she prefers to see primary care physician instead of going to emergency room. Please discuss patient's decision with me after you discuss this with patient

## 2014-07-16 NOTE — Telephone Encounter (Signed)
PT HAVING PAIN IN HEAD , NECK AND SHOULDER.  TRAMADOL NOT WORKING  CAN ELSE CAN SHE DO, HAD PROCEDURE LAST Wednesday.

## 2014-07-17 NOTE — Telephone Encounter (Signed)
AGREE

## 2014-07-17 NOTE — Telephone Encounter (Signed)
Called patient back and left voicemail of your recommendations. Instructed patient to call back for any further questions/ concerns.

## 2014-07-24 ENCOUNTER — Encounter: Payer: Self-pay | Admitting: Pain Medicine

## 2014-08-01 ENCOUNTER — Ambulatory Visit (INDEPENDENT_AMBULATORY_CARE_PROVIDER_SITE_OTHER): Payer: Self-pay | Admitting: Internal Medicine

## 2014-08-01 DIAGNOSIS — E119 Type 2 diabetes mellitus without complications: Secondary | ICD-10-CM

## 2014-08-03 NOTE — Progress Notes (Signed)
Subjective:  Patient ID: Sherri CowperVictoria Montgomery Lee, female    DOB: [birth year]  Age: 59 y.o. MRN: [id number]  CC: The encounter diagnosis was Diabetes mellitus, controlled.  HPI Sherri Lee presents for  Follow up on DM ,  But failed to keep scheduled  appt.   Outpatient Prescriptions Prior to Visit  Medication Sig Dispense Refill  . albuterol (PROVENTIL HFA;VENTOLIN HFA) 108 (90 BASE) MCG/ACT inhaler Inhale 2 puffs into the lungs every 6 (six) hours as needed for wheezing or shortness of breath. 1 Inhaler 11  . amoxicillin-clavulanate (AUGMENTIN) 875-125 MG per tablet Take 1 tablet by mouth 2 (two) times daily. 14 tablet 0  . Azelastine-Fluticasone (DYMISTA) 137-50 MCG/ACT SUSP Place 2 Squirts into the nose daily. In each nostril 23 g 11  . Biotin 1000 MCG tablet Take 1,000 mcg by mouth 3 (three) times daily.    . calcium carbonate (OS-CAL) 600 MG TABS Take 600 mg by mouth 2 (two) times daily with a meal.    . chlorpheniramine-HYDROcodone (TUSSIONEX) 10-8 MG/5ML LQCR Take 5 mLs by mouth at bedtime as needed. 200 mL 0  . clonazePAM (KLONOPIN) 1 MG tablet Take 1 mg by mouth 2 (two) times daily as needed.    . dicyclomine (BENTYL) 20 MG tablet TAKE 1 TABLET (20 MG TOTAL) BY MOUTH EVERY 6 (SIX) HOURS. 120 tablet 1  . erythromycin (ROMYCIN) ophthalmic ointment Apply to left eyelid 4 times daily 1 g 0  . estradiol-norethindrone (COMBIPATCH) 0.05-0.14 MG/DAY Place 1 patch onto the skin 2 (two) times a week.    . folic acid (FOLVITE) 1 MG tablet Take 1 mg by mouth daily.    Marland Kitchen. gabapentin (NEURONTIN) 100 MG capsule TAKE ONE CAPSULE BY MOUTH 3 TIMES A DAY 90 capsule 0  . gabapentin (NEURONTIN) 100 MG capsule TAKE ONE CAPSULE BY MOUTH 3 TIMES A DAY 90 capsule 3  . HYDROcodone-acetaminophen (NORCO/VICODIN) 5-325 MG per tablet Take 1 tablet by mouth every 6 (six) hours as needed for moderate pain. 90 tablet 0  . hyoscyamine (LEVSIN, ANASPAZ) 0.125 MG tablet Take 1 tablet (0.125 mg total)  by mouth 2 (two) times daily. 60 tablet 1  . metoprolol succinate (TOPROL-XL) 50 MG 24 hr tablet TAKE 1 TABLET (50 MG TOTAL) BY MOUTH DAILY WITH OR IMMEDIATELY FOLLOWING A MEAL. 90 tablet 1  . montelukast (SINGULAIR) 10 MG tablet Take 10 mg by mouth at bedtime.    . Multiple Vitamin (MULTIVITAMIN) tablet Take 1 tablet by mouth daily. Bariatric Vitamin    . predniSONE (STERAPRED UNI-PAK) 10 MG tablet 6 tablets on Day 1 , then reduce by 1 tablet daily until gone 21 tablet 0  . predniSONE (STERAPRED UNI-PAK) 10 MG tablet 6 tablets on Day 1 , then reduce by 1 tablet daily until gone 21 tablet 0  . promethazine (PHENERGAN) 12.5 MG tablet Take 12.5 mg by mouth every 6 (six) hours as needed for nausea.    . sertraline (ZOLOFT) 100 MG tablet Take 100 mg by mouth daily.    Marland Kitchen. spironolactone (ALDACTONE) 12.5 mg TABS Take 0.5 tablets (12.5 mg total) by mouth daily. 30 tablet 6  . traMADol (ULTRAM) 50 MG tablet Take 1 tablet (50 mg total) by mouth every 6 (six) hours as needed. for pain 120 tablet 0  . traZODone (DESYREL) 50 MG tablet Take 0.5-1 tablets (25-50 mg total) by mouth at bedtime as needed for sleep. 30 tablet 3  . vitamin B-12 (CYANOCOBALAMIN) 1000 MCG tablet Take 3,000 mcg by  mouth daily.     No facility-administered medications prior to visit.    Review of Systems;  Patient denies headache, fevers, malaise, unintentional weight loss, skin rash, eye pain, sinus congestion and sinus pain, sore throat, dysphagia,  hemoptysis , cough, dyspnea, wheezing, chest pain, palpitations, orthopnea, edema, abdominal pain, nausea, melena, diarrhea, constipation, flank pain, dysuria, hematuria, urinary  Frequency, nocturia, numbness, tingling, seizures,  Focal weakness, Loss of consciousness,  Tremor, insomnia, depression, anxiety, and suicidal ideation.      Objective:  There were no vitals taken for this visit.  BP Readings from Last 3 Encounters:  01/20/14 138/90  07/31/13 130/85  05/28/13 120/84     Wt Readings from Last 3 Encounters:  01/20/14 164 lb (74.39 kg)  07/31/13 160 lb (72.576 kg)  05/28/13 161 lb 12 oz (73.369 kg)    General appearance: alert, cooperative and appears stated age Ears: normal TM's and external ear canals both ears Throat: lips, mucosa, and tongue normal; teeth and gums normal Neck: no adenopathy, no carotid bruit, supple, symmetrical, trachea midline and thyroid not enlarged, symmetric, no tenderness/mass/nodules Back: symmetric, no curvature. ROM normal. No CVA tenderness. Lungs: clear to auscultation bilaterally Heart: regular rate and rhythm, S1, S2 normal, no murmur, click, rub or gallop Abdomen: soft, non-tender; bowel sounds normal; no masses,  no organomegaly Pulses: 2+ and symmetric Skin: Skin color, texture, turgor normal. No rashes or lesions Lymph nodes: Cervical, supraclavicular, and axillary nodes normal.  Lab Results  Component Value Date   HGBA1C 6.1 10/28/2013   HGBA1C 6.0 05/03/2011    Lab Results  Component Value Date   CREATININE 0.85 02/23/2014   CREATININE 0.7 10/28/2013   CREATININE 0.6 08/21/2012    Lab Results  Component Value Date   WBC 6.2 02/23/2014   HGB 10.3* 02/23/2014   HCT 33.4* 02/23/2014   PLT 225 02/23/2014   GLUCOSE 94 02/23/2014   CHOL 178 10/28/2013   TRIG 49.0 10/28/2013   HDL 73.20 10/28/2013   LDLCALC 95 10/28/2013   ALT 46 02/23/2014   AST 36 02/23/2014   NA 142 02/23/2014   K 3.5 02/23/2014   CL 109* 02/23/2014   CREATININE 0.85 02/23/2014   BUN 8 02/23/2014   CO2 26 02/23/2014   HGBA1C 6.1 10/28/2013   MICROALBUR 1.1 05/10/2011    No results found.  Assessment & Plan:   Problem List Items Addressed This Visit    Diabetes mellitus, controlled - Primary    Patient failed to keep 6 month follow up appt.          I am having [name] maintain her sertraline, montelukast, calcium carbonate, multivitamin, estradiol-norethindrone, vitamin B-12, folic acid, Biotin,  spironolactone, promethazine, erythromycin, amoxicillin-clavulanate, predniSONE, predniSONE, albuterol, Azelastine-Fluticasone, chlorpheniramine-HYDROcodone, traZODone, hyoscyamine, gabapentin, clonazePAM, HYDROcodone-acetaminophen, gabapentin, dicyclomine, traMADol, and metoprolol succinate.  No orders of the defined types were placed in this encounter.    There are no discontinued medications.  Follow-up: No Follow-up on file.   [name], MD

## 2014-08-03 NOTE — Assessment & Plan Note (Signed)
Patient failed to keep 6 month follow up appt.

## 2014-08-03 NOTE — Progress Notes (Signed)
Patient failed to keep scheduled appt,  She was given the courtesy of a refill on her tramadol last month, which will not be repeated.

## 2014-08-13 ENCOUNTER — Telehealth: Payer: Self-pay | Admitting: *Deleted

## 2014-08-13 ENCOUNTER — Other Ambulatory Visit: Payer: Self-pay | Admitting: Pain Medicine

## 2014-08-13 NOTE — Telephone Encounter (Signed)
Nurses Please call Sherri Lee to inform her that she needs an appointment for evaluation prior to prescribing additional medication at this time. Patient called to request refill of her tramadol. Nurses please have Morrie SheldonAshley schedule appointment for patient if patient wishes to come for evaluation and for medication prescription.

## 2014-08-13 NOTE — Telephone Encounter (Signed)
DO NOT REFILL.  DR CRISP IS MANAGING.

## 2014-08-13 NOTE — Telephone Encounter (Signed)
Left detailed message on VM advising of MDs message 

## 2014-08-13 NOTE — Telephone Encounter (Signed)
Pt called requesting Tramadol refill.  On 4.1.16 your Rx'd #120 to pt.  In her message she stated [facility] to return for OV prior to additional refills.  Review of chart pt was Rx'd #150 Tramadol  On 4.12.16 by Dr Metta Clinesrisp (per his eval note in media tab). Attached is the phone note from Dr Metta Clinesrisp today.  Please advise    Ewing SchleinGregory Crisp, MD at 08/13/2014 1:49 PM    Status: Signed      Expand All Collapse All    Nurses Please call Mrs. Francetta FoundVictoria Schreffler to inform her that she needs an appointment for evaluation prior to prescribing additional medication at this time. Patient called to request refill of her tramadol. Nurses please have Morrie SheldonAshley schedule appointment for patient if patient wishes to come for evaluation and for medication prescription.

## 2014-08-14 NOTE — Telephone Encounter (Signed)
Encounter closed

## 2014-08-27 ENCOUNTER — Other Ambulatory Visit: Payer: Self-pay | Admitting: Internal Medicine

## 2014-09-02 ENCOUNTER — Other Ambulatory Visit: Payer: Self-pay | Admitting: Internal Medicine

## 2014-09-02 NOTE — Telephone Encounter (Signed)
Appt 09/10/14.

## 2014-09-10 ENCOUNTER — Encounter: Payer: Self-pay | Admitting: Internal Medicine

## 2014-09-10 ENCOUNTER — Encounter (INDEPENDENT_AMBULATORY_CARE_PROVIDER_SITE_OTHER): Payer: Self-pay

## 2014-09-10 ENCOUNTER — Ambulatory Visit (INDEPENDENT_AMBULATORY_CARE_PROVIDER_SITE_OTHER): Payer: 59 | Admitting: Internal Medicine

## 2014-09-10 VITALS — BP 128/82 | HR 84 | Temp 98.4°F | Resp 12 | Ht 66.0 in | Wt 167.0 lb

## 2014-09-10 DIAGNOSIS — E559 Vitamin D deficiency, unspecified: Secondary | ICD-10-CM | POA: Diagnosis not present

## 2014-09-10 DIAGNOSIS — F409 Phobic anxiety disorder, unspecified: Secondary | ICD-10-CM

## 2014-09-10 DIAGNOSIS — M546 Pain in thoracic spine: Secondary | ICD-10-CM | POA: Diagnosis not present

## 2014-09-10 DIAGNOSIS — I10 Essential (primary) hypertension: Secondary | ICD-10-CM

## 2014-09-10 DIAGNOSIS — R251 Tremor, unspecified: Secondary | ICD-10-CM | POA: Insufficient documentation

## 2014-09-10 DIAGNOSIS — F5105 Insomnia due to other mental disorder: Secondary | ICD-10-CM

## 2014-09-10 DIAGNOSIS — E119 Type 2 diabetes mellitus without complications: Secondary | ICD-10-CM

## 2014-09-10 DIAGNOSIS — Z635 Disruption of family by separation and divorce: Secondary | ICD-10-CM

## 2014-09-10 DIAGNOSIS — Z1159 Encounter for screening for other viral diseases: Secondary | ICD-10-CM

## 2014-09-10 LAB — HEPATITIS C ANTIBODY: HCV Ab: NEGATIVE

## 2014-09-10 MED ORDER — TRAMADOL HCL 50 MG PO TABS: 50.0000 mg | ORAL_TABLET | Freq: Two times a day (BID) | ORAL | Status: DC
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
## 2014-09-10 NOTE — Assessment & Plan Note (Signed)
Will refill tramadol

## 2014-09-10 NOTE — Patient Instructions (Signed)
I am referring you to your neurologist to evaluate your tremor

## 2014-09-11 LAB — COMPREHENSIVE METABOLIC PANEL
ALT: 40 U/L — ABNORMAL HIGH (ref 0–35)
AST: 33 U/L (ref 0–37)
Albumin: 3.5 g/dL (ref 3.5–5.2)
Alkaline Phosphatase: 96 U/L (ref 39–117)
BUN: 8 mg/dL (ref 6–23)
CO2: 29 mEq/L (ref 19–32)
Calcium: 9.1 mg/dL (ref 8.4–10.5)
Chloride: 108 mEq/L (ref 96–112)
Creatinine, Ser: 0.65 mg/dL (ref 0.40–1.20)
GFR: 98.91 mL/min (ref >60.00)
Glucose, Bld: 89 mg/dL (ref 70–99)
Potassium: 4.1 mEq/L (ref 3.5–5.1)
Sodium: 142 mEq/L (ref 135–145)
Total Bilirubin: 0.4 mg/dL (ref 0.2–1.2)
Total Protein: 6.2 g/dL (ref 6.0–8.3)

## 2014-09-11 LAB — HEMOGLOBIN A1C: Hgb A1c MFr Bld: 5.9 % (ref 4.6–6.5)

## 2014-09-11 LAB — LIPID PANEL
Cholesterol: 168 mg/dL (ref 0–200)
HDL: 69.3 mg/dL (ref >39.00)
LDL Cholesterol: 87 mg/dL (ref 0–99)
NonHDL: 98.7
Total CHOL/HDL Ratio: 2
Triglycerides: 59 mg/dL (ref 0.0–149.0)
VLDL: 11.8 mg/dL (ref 0.0–40.0)

## 2014-09-11 LAB — VITAMIN D 25 HYDROXY (VIT D DEFICIENCY, FRACTURES): VITD: 35.66 ng/mL (ref 30.00–100.00)

## 2014-09-11 LAB — MICROALBUMIN / CREATININE URINE RATIO
Creatinine,U: 161.5 mg/dL
Microalb Creat Ratio: 0.4 mg/g (ref 0.0–30.0)
Microalb, Ur: 0.7 mg/dL (ref 0.0–1.9)

## 2014-09-11 LAB — T4 AND TSH
T4, Total: 6.1 ug/dL (ref 4.5–12.0)
TSH: 0.708 u[IU]/mL (ref 0.450–4.500)

## 2014-09-11 LAB — LDL CHOLESTEROL, DIRECT: Direct LDL: 85 mg/dL

## 2014-09-11 LAB — VITAMIN B12: Vitamin B-12: 1022 pg/mL — ABNORMAL HIGH (ref 211–911)

## 2014-09-12 ENCOUNTER — Encounter: Payer: Self-pay | Admitting: *Deleted

## 2014-09-13 DIAGNOSIS — Z635 Disruption of family by separation and divorce: Secondary | ICD-10-CM | POA: Insufficient documentation

## 2014-09-13 NOTE — Assessment & Plan Note (Signed)
Exam consistent with BET but patent and her children are concerned about early PD.  Referral to [facility] in process

## 2014-09-13 NOTE — Assessment & Plan Note (Signed)
Well controlled on current regimen., no changes today.  BP 128/82 mmHg  Pulse 84  Temp(Src) 98.4 F (36.9 C) (Oral)  Resp 12  Ht 5\' 6"  (1.676 m)  Wt 167 lb (75.751 kg)  BMI 26.97 kg/m2  SpO2 100%   Lab Results  Component Value Date   CREATININE 0.65 09/10/2014   Lab Results  Component Value Date   NA 142 09/10/2014   K 4.1 09/10/2014   CL 108 09/10/2014   CO2 29 09/10/2014

## 2014-09-13 NOTE — Assessment & Plan Note (Addendum)
Well-controlled on diet  Alone since bariatric surgery. She is not  up-to-date on eye exam, sdespite reminders,  and foot exam is normal today.  Lipid panel is normal as well. Lab Results  Component Value Date   HGBA1C 5.9 09/10/2014   Lab Results  Component Value Date   CHOL 168 09/10/2014   HDL 69.30 09/10/2014   LDLCALC 87 09/10/2014   LDLDIRECT 85.0 09/10/2014   TRIG 59.0 09/10/2014   CHOLHDL 2 09/10/2014
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
## 2014-09-13 NOTE — Assessment & Plan Note (Addendum)
Secondary to marital strife and impending divorce.

## 2014-09-24 ENCOUNTER — Encounter: Payer: Self-pay | Admitting: Pain Medicine

## 2014-09-24 ENCOUNTER — Ambulatory Visit: Payer: 59 | Attending: Pain Medicine | Admitting: Pain Medicine

## 2014-09-24 VITALS — BP 121/73 | HR 67 | Temp 98.6°F | Resp 18 | Ht 65.0 in | Wt 157.0 lb

## 2014-09-24 DIAGNOSIS — M5134 Other intervertebral disc degeneration, thoracic region: Secondary | ICD-10-CM | POA: Diagnosis not present

## 2014-09-24 DIAGNOSIS — R51 Headache: Secondary | ICD-10-CM | POA: Insufficient documentation

## 2014-09-24 DIAGNOSIS — M542 Cervicalgia: Secondary | ICD-10-CM | POA: Diagnosis present

## 2014-09-24 DIAGNOSIS — M5030 Other cervical disc degeneration, unspecified cervical region: Secondary | ICD-10-CM | POA: Insufficient documentation

## 2014-09-24 DIAGNOSIS — M5481 Occipital neuralgia: Secondary | ICD-10-CM | POA: Insufficient documentation

## 2014-09-24 DIAGNOSIS — G588 Other specified mononeuropathies: Secondary | ICD-10-CM | POA: Insufficient documentation

## 2014-09-24 DIAGNOSIS — Z981 Arthrodesis status: Secondary | ICD-10-CM | POA: Diagnosis not present

## 2014-09-24 MED ORDER — GABAPENTIN 100 MG PO CAPS: ORAL_CAPSULE | ORAL | Status: DC

## 2014-09-24 NOTE — Progress Notes (Signed)
   Subjective:    Patient ID: Sherri Lee, female    DOB: 1955-09-11, 59 y.o.   MRN: [id number]  HPI  Patient is 59 year old female [facility] and treatment of pain involving the region of the neck headaches upper extremity regions with some of her back pain is well. On today's visit we discussed patient's condition. Patient has been involved with painting at her residence wishes require patient to move some furniture. Patient has had slight exacerbation of pain occurring in the region of the upper back and upper extremity regions. We will discussed interventional treatment as well as use of TENS unit and medications and other treatment and will remain available to consider interventional treatment should patient's symptoms persist or increase. The patient was understanding and in agreement with suggested treatment plan. Patient was given prescription for Neurontin 100 mg capsules limit 3 per day quantity of 90 on today's visit. Patient will continue tramadol. We will consider patient for interventional treatment other treatment pending follow-up evaluation. The patient was understanding and in agreement status treatment plan      Review of Systems     Objective:   Physical Exam There was moderate tenderness of the splenius capitis and occipitalis musculature region. There was unremarkable Spurling's maneuver. No masses of the head and neck noted. Well-healed surgical scar of the cervical region without increased warmth or erythema in the region of the scar. Tenderness over the cervical facet cervical paraspinal musculature region of mild to moderate degree. Mild to moderate tenderness of the thoracic facet thoracic paraspinal musculature region. Patient appeared to be with slightly decreased grip strength. Tinel and Phalen's maneuver were without increase of pain of significant degree. No crepitus of the thoracic region was noted.  Palpation over the lumbar paraspinal musculature region lumbar facet region associated with mild discomfort. Lateral bending and rotation and extension and palpation of the lumbar facets reproduce mild discomfort. Negative clonus negative Homans. No definite sensory deficit of dermatomal distribution detected. Mild tenderness of the greater trochanteric region iliotibial band region. Abdomen nontender with no costovertebral angle tenderness noted.       Assessment & Plan:    Degenerative disc disease cervical spine Status post ACDF of the cervical spine C4-C7 with anterior plate and screws  Degenerative disc disease thoracic spine [id number] without definitive disc protrusion or stenosis or foraminal encroachment. Degenerative changes noted throughout the thoracic spine with disc space narrowing of the thoracic spine at the T3-T7 region  Thoracic facet syndrome  Intercostal neuralgia  Bilateral occipital neuralgia  Cervicogenic headaches    Plan  Continue present medications Neurontin and tramadol ( Ultram ) . Patient was given prescription for Neurontin 100 mg capsules: Limit 3 capsules per day  # 100  F/U PCP Dr. Darrick Huntsmanullo  evaliation of  BP and general medical  condition as discussed.  F/U surgical evaluation with neurosurgeon Dr.Lui as needed  F/U neurological evaluation of mild tremor of upper extremities as discussed  TENS unit use. Patient will resume use of TENS unit as discussed   May consider radiofrequency rhizolysis or intraspinal procedures pending response to present treatment and F/U evaluation.  [facility] to call [facility] should patient have concerns prior to scheduled return appointment.

## 2014-09-24 NOTE — Progress Notes (Signed)
Safety precautions to be maintained throughout the outpatient stay will include: orient to surroundings, keep bed in low position, maintain call bell within reach at all times, provide assistance with transfer out of bed and ambulation.  

## 2014-09-24 NOTE — Patient Instructions (Signed)
Continue present medications tramadol (Ultram) and Neurontin  F/U PCP Dr.Tullo   for evaliation of  BP and general medical  condition.  F/U surgical evaluation  F/U neurological evaluation  May consider radiofrequency rhizolysis or intraspinal procedures pending response to present treatment and F/U evaluation.  [facility] to call [facility] should patient have concerns prior to scheduled return appointment.

## 2014-10-14 ENCOUNTER — Other Ambulatory Visit: Payer: Self-pay | Admitting: Pain Medicine

## 2014-10-24 ENCOUNTER — Other Ambulatory Visit: Payer: Self-pay | Admitting: Pain Medicine

## 2014-10-24 NOTE — Telephone Encounter (Signed)
Needs refill on gabapentui

## 2014-10-27 ENCOUNTER — Other Ambulatory Visit: Payer: Self-pay | Admitting: Internal Medicine

## 2014-10-28 NOTE — Telephone Encounter (Signed)
Last OV 6.29.16.  Dr Metta Clines would not refill without appoint.  Please advise refill

## 2014-10-29 NOTE — Telephone Encounter (Signed)
Ok to refill,  Refill sent  

## 2014-10-30 ENCOUNTER — Telehealth: Payer: Self-pay | Admitting: *Deleted

## 2014-10-30 NOTE — Telephone Encounter (Signed)
This was done yesterday.  

## 2014-10-30 NOTE — Telephone Encounter (Signed)
Pt called states Dr [facility]ta Clines at [facility] will not refill Gabapentin unless she is seen every 30 days.  Pt requests Dr Darrick Huntsman to refill.  Please advise

## 2014-12-16 ENCOUNTER — Other Ambulatory Visit: Payer: Self-pay | Admitting: Internal Medicine

## 2014-12-25 ENCOUNTER — Other Ambulatory Visit: Payer: Self-pay | Admitting: Internal Medicine

## 2014-12-28 ENCOUNTER — Other Ambulatory Visit: Payer: Self-pay | Admitting: Internal Medicine

## 2014-12-29 ENCOUNTER — Other Ambulatory Visit: Payer: Self-pay | Admitting: *Deleted

## 2015-01-04 ENCOUNTER — Telehealth: Payer: Self-pay | Admitting: Internal Medicine

## 2015-01-05 NOTE — Telephone Encounter (Signed)
Refilled,  But patient is seeing Dr [facility]ta Clinesrisp at the [facility] and they may be refilling both.  Can you check before you send the tramadol?

## 2015-01-05 NOTE — Telephone Encounter (Signed)
Please advise? Last ov was 09/10/14

## 2015-01-07 ENCOUNTER — Other Ambulatory Visit: Payer: Self-pay | Admitting: Internal Medicine

## 2015-01-08 NOTE — Telephone Encounter (Signed)
Sent and spoke with patient.

## 2015-01-08 NOTE — Telephone Encounter (Signed)
Pt called back about her prescription for traMADol (ULTRAM) 50 MG tablet and gabapentin (NEURONTIN) 100 MG capsule. [facility]acy is [facility] on [address] [place] cell number is [phone].Thank You!

## 2015-02-12 ENCOUNTER — Ambulatory Visit (INDEPENDENT_AMBULATORY_CARE_PROVIDER_SITE_OTHER): Payer: 59 | Admitting: Family Medicine

## 2015-02-12 ENCOUNTER — Encounter: Payer: Self-pay | Admitting: Family Medicine

## 2015-02-12 VITALS — BP 122/88 | HR 77 | Temp 98.3°F | Ht 65.0 in | Wt 157.5 lb

## 2015-02-12 DIAGNOSIS — J988 Other specified respiratory disorders: Secondary | ICD-10-CM

## 2015-02-12 MED ORDER — PREDNISONE 10 MG (21) PO TBPK: 10.0000 mg | ORAL_TABLET | Freq: Every day | ORAL | Status: DC

## 2015-02-12 NOTE — Assessment & Plan Note (Signed)
Patient afebrile with unremarkable exam. No indication for antibiotic at this time. Treating with supportive care and prednisone.

## 2015-02-12 NOTE — Progress Notes (Signed)
Subjective:  Patient ID: Sherri Lee, female    DOB: [birth year]  Age: 59 y.o. MRN: [id number]  CC: Dizziness, sinus congestion, not feeling well  HPI:  59 year old female presents to clinic today for an acute visit with the above complaint.  Patient states that she was sick approximately 2 weeks ago and was seen in urgent care and treated for respiratory infection. She states that she was given minocycline. She states that she had improvement in her symptoms but as of the past few days has started feeling poorly again. She states that she's having sinus congestion and headache as well as some associated dizziness. She states that she's having some associated fatigue and just generalized not feeling well. No exacerbating or relieving factors. Patient is concerned that she may be developing another infection.  Social Hx   Social History   Social History  . Marital Status: Married    Spouse Name: N/A  . Number of Children: N/A  . Years of Education: N/A   Social History Main Topics  . Smoking status: Never Smoker   . Smokeless tobacco: Never Used  . Alcohol Use: No  . Drug Use: No  . Sexual Activity: Yes   Other Topics Concern  . None   Social History Narrative   Lives with spouse, son, and stepson who has serious psychiatric issues and alcohol abuse   Review of Systems  Constitutional: Positive for fatigue. Negative for fever.  HENT: Positive for congestion.   Respiratory: Positive for cough.   Neurological: Positive for dizziness.   Objective:  BP 122/88 mmHg  Pulse 77  Temp(Src) 98.3 F (36.8 C) (Oral)  Ht  (1.651 m)  Wt 157 lb 8 oz (71.442 kg)  BMI 26.21 kg/m2  SpO2 98%  BP/Weight 02/12/2015 09/24/2014 09/10/2014  Systolic BP 122 121 128  Diastolic BP 88 73 82  Wt. (Lbs) 157.5 157 167  BMI 26.21 26.13 26.97   Physical Exam  Constitutional: She appears well-developed. No distress.  HENT:  Head: Normocephalic and atraumatic.  Right Ear:  External ear normal.  Left Ear: External ear normal.  Mouth/Throat: Oropharynx is clear and moist. No oropharyngeal exudate.  Normal TMs bilaterally.  Cardiovascular: Normal rate and regular rhythm.   Pulmonary/Chest: Effort normal.  Neurological: She is alert.  Vitals reviewed.  Lab Results  Component Value Date   WBC 6.2 02/23/2014   HGB 10.3* 02/23/2014   HCT 33.4* 02/23/2014   PLT 225 02/23/2014   GLUCOSE 89 09/10/2014   CHOL 168 09/10/2014   TRIG 59.0 09/10/2014   HDL 69.30 09/10/2014   LDLDIRECT 85.0 09/10/2014   LDLCALC 87 09/10/2014   ALT 40* 09/10/2014   AST 33 09/10/2014   NA 142 09/10/2014   K 4.1 09/10/2014   CL 108 09/10/2014   CREATININE 0.65 09/10/2014   BUN 8 09/10/2014   CO2 29 09/10/2014   TSH 0.708 09/10/2014   HGBA1C 5.9 09/10/2014   MICROALBUR 0.7 09/10/2014    Assessment & Plan:   Problem List Items Addressed This Visit    Respiratory infection - Primary    Patient afebrile with unremarkable exam. No indication for antibiotic at this time. Treating with supportive care and prednisone.         Meds ordered this encounter  Medications  . predniSONE (STERAPRED UNI-PAK 21 TAB) 10 MG (21) TBPK tablet    Sig: Take 1 tablet (10 mg total) by mouth daily. 6 tablets on day 1. Then decrease by 1 tablet  daily until gone.    Dispense:  21 tablet    Refill:  0    Follow-up: No Follow-up on file.  Everlene OtherJayce Elonna Mcfarlane DO [facility] [place]

## 2015-02-12 NOTE — Progress Notes (Signed)
Pre visit review using our clinic review tool, if applicable. No additional management support is needed unless otherwise documented below in the visit note. 

## 2015-02-12 NOTE — Patient Instructions (Signed)
Your exam was unremarkable.  There is no indications for more antibiotics at this time.  Take the prednisone to help with your cough and congestion.  Follow up as needed.  Take care  Dr. Adriana Simasook

## 2015-02-26 LAB — HM MAMMOGRAPHY

## 2015-03-13 ENCOUNTER — Other Ambulatory Visit: Payer: Self-pay | Admitting: Internal Medicine

## 2015-03-13 NOTE — Telephone Encounter (Signed)
Please advise refill? 

## 2015-03-13 NOTE — Telephone Encounter (Signed)
Ok to refill,  printed rx  

## 2015-05-09 ENCOUNTER — Other Ambulatory Visit: Payer: Self-pay | Admitting: Internal Medicine

## 2015-05-11 NOTE — Telephone Encounter (Signed)
Last OV with MD 6/16 ok to refill gabapentin?

## 2015-05-11 NOTE — Telephone Encounter (Signed)
REFILLED

## 2015-05-30 ENCOUNTER — Other Ambulatory Visit: Payer: Self-pay | Admitting: Internal Medicine

## 2015-08-19 ENCOUNTER — Telehealth: Payer: Self-pay | Admitting: Internal Medicine

## 2015-08-19 MED ORDER — SILVER SULFADIAZINE 1 % EX CREA: 1.0000 "application " | TOPICAL_CREAM | Freq: Every day | CUTANEOUS | Status: DC

## 2015-08-19 NOTE — Telephone Encounter (Signed)
Tried to reach patient for more information but unable to reach or leave message .

## 2015-08-19 NOTE — Telephone Encounter (Signed)
Pt called about wanting to know if Dr Darrick Huntsmanullo can call something in for her lips have sunburn when she went to the beach. Pt does not have any insurance and her new insurance will be active in 30 days. Pt scheduled an appt on 07/10 @11am .  [id number] [place] - [address]  Call pt @ [phone]. Thank you!

## 2015-08-19 NOTE — Telephone Encounter (Signed)
[facility]ad[facility]. Use on blisters  ,  Also use cool compresses,  And use aleve or ibuprofen for the pain associated with sunburn

## 2015-08-20 ENCOUNTER — Emergency Department: Payer: Self-pay

## 2015-08-20 ENCOUNTER — Emergency Department
Admission: EM | Admit: 2015-08-20 | Discharge: 2015-08-20 | Disposition: A | Discharge status: Home / Self Care | Payer: Self-pay | Attending: Emergency Medicine | Admitting: Emergency Medicine

## 2015-08-20 DIAGNOSIS — R51 Headache: Secondary | ICD-10-CM | POA: Insufficient documentation

## 2015-08-20 DIAGNOSIS — F329 Major depressive disorder, single episode, unspecified: Secondary | ICD-10-CM | POA: Insufficient documentation

## 2015-08-20 DIAGNOSIS — Z79899 Other long term (current) drug therapy: Secondary | ICD-10-CM | POA: Insufficient documentation

## 2015-08-20 DIAGNOSIS — E119 Type 2 diabetes mellitus without complications: Secondary | ICD-10-CM | POA: Insufficient documentation

## 2015-08-20 DIAGNOSIS — I10 Essential (primary) hypertension: Secondary | ICD-10-CM | POA: Insufficient documentation

## 2015-08-20 DIAGNOSIS — Y929 Unspecified place or not applicable: Secondary | ICD-10-CM | POA: Insufficient documentation

## 2015-08-20 DIAGNOSIS — S161XXA Strain of muscle, fascia and tendon at neck level, initial encounter: Secondary | ICD-10-CM | POA: Insufficient documentation

## 2015-08-20 DIAGNOSIS — Y939 Activity, unspecified: Secondary | ICD-10-CM | POA: Insufficient documentation

## 2015-08-20 DIAGNOSIS — M5134 Other intervertebral disc degeneration, thoracic region: Secondary | ICD-10-CM | POA: Insufficient documentation

## 2015-08-20 DIAGNOSIS — W108XXA Fall (on) (from) other stairs and steps, initial encounter: Secondary | ICD-10-CM | POA: Insufficient documentation

## 2015-08-20 DIAGNOSIS — J45909 Unspecified asthma, uncomplicated: Secondary | ICD-10-CM | POA: Insufficient documentation

## 2015-08-20 DIAGNOSIS — Z791 Long term (current) use of non-steroidal anti-inflammatories (NSAID): Secondary | ICD-10-CM | POA: Insufficient documentation

## 2015-08-20 DIAGNOSIS — M5030 Other cervical disc degeneration, unspecified cervical region: Secondary | ICD-10-CM | POA: Insufficient documentation

## 2015-08-20 DIAGNOSIS — S0083XA Contusion of other part of head, initial encounter: Secondary | ICD-10-CM | POA: Insufficient documentation

## 2015-08-20 DIAGNOSIS — Y999 Unspecified external cause status: Secondary | ICD-10-CM | POA: Insufficient documentation

## 2015-08-20 MED ORDER — PENCICLOVIR 1 % EX CREA: 1.0000 "application " | TOPICAL_CREAM | CUTANEOUS | Status: DC

## 2015-08-20 MED ORDER — METHOCARBAMOL 750 MG PO TABS: 750.0000 mg | ORAL_TABLET | Freq: Four times a day (QID) | ORAL | Status: DC

## 2015-08-20 MED ORDER — TRAMADOL HCL 50 MG PO TABS: 50.0000 mg | ORAL_TABLET | Freq: Four times a day (QID) | ORAL | Status: DC | PRN

## 2015-08-20 NOTE — Telephone Encounter (Signed)
Left message for patient to return call to office. 

## 2015-08-20 NOTE — ED Notes (Addendum)
Pt. Reports falling yesterday and hurting her head/neck. Pt. Reports having disc fusion in her neck and concerns of damaging that area. Pt. Denies changes in airway or breathing but reports that her nose "just doesn't feel normal" and is concerned that it may be broken.

## 2015-08-20 NOTE — Discharge Instructions (Signed)

## 2015-08-20 NOTE — ED Provider Notes (Signed)
[facility] Emergency Department Provider Note   ____________________________________________  Time seen: Approximately 4:59 PM  I have reviewed the triage vital signs and the nursing notes.   HISTORY  Chief Complaint Fall    HPI Sherri Lee is a 60 y.o. female patient complain of nasal and neck pain secondary to a fall yesterday. Patient states she tripped and fell on some steps landing on her face. Patient's concern for fractured nose and a possible reinjury to her neck. Patient had a cervical fusion in 2008. She denies any loss of consciousness this complaint. Patient her pain is not controlled with ibuprofen. Patient denies any vision disturbance or vertigo. No other palliative measures taken for this complaint. Patient rates her pain as 5/10. Past Medical History  Diagnosis Date  . Asthma   . Schatzki's ring     dialated 3 times by [employer]   . Hypertension   . Depression   . Anxiety     Patient Active Problem List   Diagnosis Date Noted  . Respiratory infection 02/12/2015  . DDD (degenerative disc disease), cervical 09/24/2014  . Bilateral occipital neuralgia 09/24/2014  . Intercostal neuralgia 09/24/2014  . DDD (degenerative disc disease), thoracic 09/24/2014  . Marital problem involving divorce 09/13/2014  . Tremor of both hands 09/10/2014  . Insomnia due to anxiety and fear 02/26/2013  . Other and unspecified ovarian cyst 08/09/2012  . Alopecia areata 07/11/2012  . Cervicalgia 07/11/2012  . Thoracic spine pain 10/04/2011  . Essential hypertension 05/10/2011  . Diabetes mellitus, controlled (HCC) 05/10/2011  . Status post gastric bypass for obesity 05/10/2011    Past Surgical History  Procedure Laterality Date  . Bunionectomy      left foot   . Cesarean section    . Cholecystectomy  June 2013  . Cervical fusion    . Gastric bypass    . Abdominal adhesion surgery      Current Outpatient Rx  Name  Route  Sig   Dispense  Refill  . albuterol (PROVENTIL HFA;VENTOLIN HFA) 108 (90 BASE) MCG/ACT inhaler   Inhalation   Inhale 2 puffs into the lungs every 6 (six) hours as needed for wheezing or shortness of breath.   1 Inhaler   11   . Azelastine-Fluticasone (DYMISTA) 137-50 MCG/ACT SUSP   Nasal   Place 2 Squirts into the nose daily. In each nostril   23 g   11   . Biotin 1000 MCG tablet   Oral   Take 1,000 mcg by mouth 3 (three) times daily.         . calcium carbonate (OS-CAL) 600 MG TABS   Oral   Take 600 mg by mouth 2 (two) times daily with a meal.         . clonazePAM (KLONOPIN) 1 MG tablet   Oral   Take 1 mg by mouth 2 (two) times daily as needed.         . dicyclomine (BENTYL) 20 MG tablet      TAKE 1 TABLET BY MOUTH EVERY 6 HOURS   120 tablet   1   . estradiol-norethindrone (COMBIPATCH) 0.05-0.14 MG/DAY   Transdermal   Place 1 patch onto the skin 2 (two) times a week.         . folic acid (FOLVITE) 1 MG tablet   Oral   Take 1 mg by mouth daily.         Marland Kitchen gabapentin (NEURONTIN) 100 MG capsule  TAKE ONE CAPSULE BY MOUTH 3 TIMES A DAY**NEEDS OFFICE VISIT**   30 capsule   0   . gabapentin (NEURONTIN) 100 MG capsule      LIMIT 1-3 CAPSULES BY MOUTH PER DAY IF TOLERATED   90 capsule   3   . HYDROcodone-acetaminophen (NORCO/VICODIN) 5-325 MG per tablet   Oral   Take 1 tablet by mouth every 6 (six) hours as needed for moderate pain.   90 tablet   0     Dispense as written.    May fill on or after March 23 2014   . hyoscyamine (LEVSIN, ANASPAZ) 0.125 MG tablet   Oral   Take 1 tablet (0.125 mg total) by mouth 2 (two) times daily.   60 tablet   1   . lamoTRIgine (LAMICTAL) 200 MG tablet   Oral   Take 1 tablet by mouth daily.         . methocarbamol (ROBAXIN-750) 750 MG tablet   Oral   Take 1 tablet (750 mg total) by mouth 4 (four) times daily.   20 tablet   0   . metoprolol succinate (TOPROL-XL) 50 MG 24 hr tablet      TAKE 1 TABLET BY  MOUTH DAILY WITH OR IMMEDIATELY FOLLOWING A MEAL   90 tablet   1   . montelukast (SINGULAIR) 10 MG tablet   Oral   Take 10 mg by mouth at bedtime.         . Multiple Vitamin (MULTIVITAMIN) tablet   Oral   Take 1 tablet by mouth daily. Bariatric Vitamin         . penciclovir (DENAVIR) 1 % cream   Topical   Apply 1 application topically every 2 (two) hours.   1.5 g   0   . predniSONE (STERAPRED UNI-PAK 21 TAB) 10 MG (21) TBPK tablet   Oral   Take 1 tablet (10 mg total) by mouth daily. 6 tablets on day 1. Then decrease by 1 tablet daily until gone.   21 tablet   0   . promethazine (PHENERGAN) 12.5 MG tablet   Oral   Take 12.5 mg by mouth every 6 (six) hours as needed for nausea.         . sertraline (ZOLOFT) 100 MG tablet   Oral   Take 100 mg by mouth daily.         . silver sulfADIAZINE (SILVADENE) 1 % cream   Topical   Apply 1 application topically daily.   50 g   0   . spironolactone (ALDACTONE) 12.5 mg TABS   Oral   Take 0.5 tablets (12.5 mg total) by mouth daily.   30 tablet   6   . traMADol (ULTRAM) 50 MG tablet      TAKE 1 TABLET BY MOUTH TWICE DAILY FOR PAIN   60 tablet   4     Not to exceed 4 additional fills before 07/04/2015   . traMADol (ULTRAM) 50 MG tablet   Oral   Take 1 tablet (50 mg total) by mouth every 6 (six) hours as needed.   20 tablet   0   . traZODone (DESYREL) 50 MG tablet   Oral   Take 0.5-1 tablets (25-50 mg total) by mouth at bedtime as needed for sleep.   30 tablet   3   . vitamin B-12 (CYANOCOBALAMIN) 1000 MCG tablet   Oral   Take 3,000 mcg by mouth daily.  Allergies Betadine  Family History  Problem Relation Age of Onset  . Breast cancer Maternal Aunt   . Colon cancer Neg Hx   . Ovarian cancer Neg Hx   . Diabetes Mother   . Hypertension Mother   . Heart disease Father     Social History Social History  Substance Use Topics  . Smoking status: Never Smoker   . Smokeless tobacco: Never  Used  . Alcohol Use: No    Review of Systems Constitutional: No fever/chills Eyes: No visual changes. ENT: No sore throat. Cardiovascular: Denies chest pain. Respiratory: Denies shortness of breath. Gastrointestinal: No abdominal pain.  No nausea, no vomiting.  No diarrhea.  No constipation. Genitourinary: Negative for dysuria. Musculoskeletal: Neck pain  Skin: Negative for rash. Neurological: Positive for headaches, focal weakness or numbness. Psychiatric:Depression and anxiety Endocrine:Hypertension Hematological/Lymphatic: Allergic/Immunilogical: Iodine __________________________________________   PHYSICAL EXAM:  VITAL SIGNS: ED Triage Vitals  Enc Vitals Group     BP 08/20/15 1643 144/89 mmHg     Pulse Rate 08/20/15 1643 75     Resp 08/20/15 1643 16     Temp 08/20/15 1643 98 F (36.7 C)     Temp Source 08/20/15 1643 Oral     SpO2 08/20/15 1643 100 %     Weight 08/20/15 1643 152 lb (68.947 kg)     Height 08/20/15 1643 5\' 5"  (1.651 m)     Head Cir --      Peak Flow --      Pain Score 08/20/15 1643 5     Pain Loc --      Pain Edu? --      Excl. in GC? --     Constitutional: Alert and oriented. Well appearing and in no acute distress. Eyes: Conjunctivae are normal. PERRL. EOMI. Head: Atraumatic. Nose: No congestion/rhinnorhea. Mouth/Throat: Mucous membranes are moist.  Oropharynx non-erythematous. Neck: No stridor.   cervical spine tenderness to palpation C4-C6. Hematological/Lymphatic/Immunilogical: No cervical lymphadenopathy. Cardiovascular: Normal rate, regular rhythm. Grossly normal heart sounds.  Good peripheral circulation. Respiratory: Normal respiratory effort.  No retractions. Lungs CTAB. Gastrointestinal: Soft and nontender. No distention. No abdominal bruits. No CVA tenderness. Musculoskeletal: No lower extremity tenderness nor edema.  No joint effusions. Neurologic:  Normal speech and language. No gross focal neurologic deficits are appreciated. No  gait instability. Skin:  Skin is warm, dry and intact. No rash noted. Psychiatric: Mood and affect are normal. Speech and behavior are normal.  ____________________________________________   LABS (all labs ordered are listed, but only abnormal results are displayed)  Labs Reviewed - No data to display ____________________________________________  EKG   ____________________________________________  RADIOLOGY  No acute findings CT of the head and cervical spine. ____________________________________________   PROCEDURES  Procedure(s) performed: None  Critical Care performed: No  ____________________________________________   INITIAL IMPRESSION / ASSESSMENT AND PLAN / ED COURSE  Pertinent labs & imaging results that were available during my care of the patient were reviewed by me and considered in my medical decision making (see chart for details).  Cervical strain and facial contusion secondary to fall. Discuss CT findings with patient. Patient given discharge Instructions. Patient get a prescription for tramadol and Robaxin. Patient advised to follow-up family doctor condition persists. ____________________________________________   FINAL CLINICAL IMPRESSION(S) / ED DIAGNOSES  Final diagnoses:  Cervical strain, acute, initial encounter  Facial contusion, initial encounter      NEW MEDICATIONS STARTED DURING THIS VISIT:  New Prescriptions   METHOCARBAMOL (ROBAXIN-750) 750 MG TABLET  Take 1 tablet (750 mg total) by mouth 4 (four) times daily.   PENCICLOVIR (DENAVIR) 1 % CREAM    Apply 1 application topically every 2 (two) hours.   TRAMADOL (ULTRAM) 50 MG TABLET    Take 1 tablet (50 mg total) by mouth every 6 (six) hours as needed.     Note:  This document was prepared using Dragon voice recognition software and may include unintentional dictation errors.    Joni Reining, PA-C 08/20/15 1825  Emily Filbert, MD 09/19/15 [phone]

## 2015-08-20 NOTE — ED Notes (Signed)
Pt reports falling on face yesterday, reports pain to nose and neck. Pt reports hx of cervical spine fusion in 2008. Pt denies LOC, ambulatory to triage room.

## 2015-08-24 NOTE — Telephone Encounter (Signed)
Patient has not return ed to call but has picked up meds.

## 2015-09-03 ENCOUNTER — Other Ambulatory Visit: Payer: Self-pay | Admitting: Internal Medicine

## 2015-09-03 NOTE — Telephone Encounter (Signed)
Ok to refill,  Refill sent  

## 2015-09-03 NOTE — Telephone Encounter (Signed)
Refill request for Gabapentin, last seen [id number], last filled [id number].  Please advise.

## 2015-09-04 ENCOUNTER — Encounter: Payer: Self-pay | Admitting: Internal Medicine

## 2015-09-04 ENCOUNTER — Other Ambulatory Visit (INDEPENDENT_AMBULATORY_CARE_PROVIDER_SITE_OTHER): Payer: Self-pay

## 2015-09-04 ENCOUNTER — Ambulatory Visit (INDEPENDENT_AMBULATORY_CARE_PROVIDER_SITE_OTHER): Payer: Self-pay | Admitting: Internal Medicine

## 2015-09-04 VITALS — BP 120/76 | HR 89 | Temp 98.1°F | Resp 12 | Ht 65.0 in | Wt 147.8 lb

## 2015-09-04 DIAGNOSIS — J069 Acute upper respiratory infection, unspecified: Secondary | ICD-10-CM

## 2015-09-04 DIAGNOSIS — F409 Phobic anxiety disorder, unspecified: Secondary | ICD-10-CM

## 2015-09-04 DIAGNOSIS — E785 Hyperlipidemia, unspecified: Secondary | ICD-10-CM

## 2015-09-04 DIAGNOSIS — N83202 Unspecified ovarian cyst, left side: Secondary | ICD-10-CM

## 2015-09-04 DIAGNOSIS — R5383 Other fatigue: Secondary | ICD-10-CM

## 2015-09-04 DIAGNOSIS — I10 Essential (primary) hypertension: Secondary | ICD-10-CM

## 2015-09-04 DIAGNOSIS — M5030 Other cervical disc degeneration, unspecified cervical region: Secondary | ICD-10-CM

## 2015-09-04 DIAGNOSIS — M542 Cervicalgia: Secondary | ICD-10-CM | POA: Insufficient documentation

## 2015-09-04 DIAGNOSIS — E538 Deficiency of other specified B group vitamins: Secondary | ICD-10-CM

## 2015-09-04 DIAGNOSIS — K589 Irritable bowel syndrome without diarrhea: Secondary | ICD-10-CM

## 2015-09-04 DIAGNOSIS — E559 Vitamin D deficiency, unspecified: Secondary | ICD-10-CM

## 2015-09-04 DIAGNOSIS — R7303 Prediabetes: Secondary | ICD-10-CM

## 2015-09-04 DIAGNOSIS — F5105 Insomnia due to other mental disorder: Secondary | ICD-10-CM

## 2015-09-04 DIAGNOSIS — J988 Other specified respiratory disorders: Secondary | ICD-10-CM

## 2015-09-04 LAB — CBC WITH DIFFERENTIAL/PLATELET
Basophils Absolute: 0 10*3/uL (ref 0.0–0.1)
Basophils Relative: 0.9 % (ref 0.0–3.0)
Eosinophils Absolute: 0.3 10*3/uL (ref 0.0–0.7)
Eosinophils Relative: 6.2 % — ABNORMAL HIGH (ref 0.0–5.0)
HCT: 30.2 % — ABNORMAL LOW (ref 36.0–46.0)
Hemoglobin: 9.6 g/dL — ABNORMAL LOW (ref 12.0–15.0)
Lymphocytes Relative: 41.3 % (ref 12.0–46.0)
Lymphs Abs: 2.2 10*3/uL (ref 0.7–4.0)
MCHC: 31.7 g/dL (ref 30.0–36.0)
MCV: 78.6 fl (ref 78.0–100.0)
Monocytes Absolute: 0.4 10*3/uL (ref 0.1–1.0)
Monocytes Relative: 7.6 % (ref 3.0–12.0)
Neutro Abs: 2.4 10*3/uL (ref 1.4–7.7)
Neutrophils Relative %: 44 % (ref 43.0–77.0)
Platelets: 220 10*3/uL (ref 150.0–400.0)
RBC: 3.85 Mil/uL — ABNORMAL LOW (ref 3.87–5.11)
RDW: 16.5 % — ABNORMAL HIGH (ref 11.5–15.5)
WBC: 5.4 10*3/uL (ref 4.0–10.5)

## 2015-09-04 LAB — VITAMIN B12: Vitamin B-12: 589 pg/mL (ref 211–911)

## 2015-09-04 LAB — LIPID PANEL
Cholesterol: 170 mg/dL (ref 0–200)
HDL: 62.2 mg/dL (ref >39.00)
LDL Cholesterol: 95 mg/dL (ref 0–99)
NonHDL: 107.67
Total CHOL/HDL Ratio: 3
Triglycerides: 63 mg/dL (ref 0.0–149.0)
VLDL: 12.6 mg/dL (ref 0.0–40.0)

## 2015-09-04 LAB — MICROALBUMIN / CREATININE URINE RATIO
Creatinine,U: 126.3 mg/dL
Microalb Creat Ratio: 0.5 mg/g (ref 0.0–30.0)
Microalb, Ur: 0.6 mg/dL (ref 0.0–1.9)

## 2015-09-04 LAB — IBC PANEL
Iron: 42 ug/dL (ref 42–145)
Saturation Ratios: 9.6 % — ABNORMAL LOW (ref 20.0–50.0)
Transferrin: 314 mg/dL (ref 212.0–360.0)

## 2015-09-04 LAB — COMPREHENSIVE METABOLIC PANEL
ALT: 9 U/L (ref 0–35)
AST: 14 U/L (ref 0–37)
Albumin: 3.6 g/dL (ref 3.5–5.2)
Alkaline Phosphatase: 56 U/L (ref 39–117)
BUN: 5 mg/dL — ABNORMAL LOW (ref 6–23)
CO2: 26 mEq/L (ref 19–32)
Calcium: 9 mg/dL (ref 8.4–10.5)
Chloride: 111 mEq/L (ref 96–112)
Creatinine, Ser: 0.75 mg/dL (ref 0.40–1.20)
GFR: 83.58 mL/min (ref >60.00)
Glucose, Bld: 130 mg/dL — ABNORMAL HIGH (ref 70–99)
Potassium: 3.7 mEq/L (ref 3.5–5.1)
Sodium: 142 mEq/L (ref 135–145)
Total Bilirubin: 0.3 mg/dL (ref 0.2–1.2)
Total Protein: 6 g/dL (ref 6.0–8.3)

## 2015-09-04 LAB — FERRITIN: Ferritin: 4.3 ng/mL — ABNORMAL LOW (ref 10.0–291.0)

## 2015-09-04 LAB — HEMOGLOBIN A1C: Hgb A1c MFr Bld: 5.9 % (ref 4.6–6.5)

## 2015-09-04 LAB — LDL CHOLESTEROL, DIRECT: Direct LDL: 99 mg/dL

## 2015-09-04 LAB — VITAMIN D 25 HYDROXY (VIT D DEFICIENCY, FRACTURES): VITD: 45.18 ng/mL (ref 30.00–100.00)

## 2015-09-04 MED ORDER — PREDNISONE 10 MG (21) PO TBPK: 10.0000 mg | ORAL_TABLET | Freq: Every day | ORAL | Status: DC

## 2015-09-04 MED ORDER — TRAMADOL HCL 50 MG PO TABS: 50.0000 mg | ORAL_TABLET | Freq: Four times a day (QID) | ORAL | Status: DC | PRN

## 2015-09-04 MED ORDER — DICYCLOMINE HCL 20 MG PO TABS: 20.0000 mg | ORAL_TABLET | Freq: Four times a day (QID) | ORAL | Status: DC

## 2015-09-04 MED ORDER — LEVOFLOXACIN 500 MG PO TABS: 500.0000 mg | ORAL_TABLET | Freq: Every day | ORAL | Status: DC

## 2015-09-04 NOTE — Patient Instructions (Signed)
You have a viral  Syndrome .  The post nasal drip is causing your sore throat.  Lavage your sinuses twice daily with Simply saline nasal spray.  Prednisone taper for the inflammation   Use benadryl 25 mg every 8 hours for the post nasal drip and Afrin nasal spray every 12 hours  as needed for the congestion.  Gargle with salt water as needed for the sore throat.  Delsym is available OTC as a cough syrup  If the throat is no better  In 3 to 4 days OR  if you develop T > 100.4,  Green nasal discharge,  Or facial pain,  You can start the levaquin    Please take a probiotic ( Align, Floraque or Culturelle), the generic version of one of these over the counter medications, or a PROBIOTIC BEVERAGE  (kombucha,  KEVITA DRINKS AVAILABLE IN THE REFRIGERATED VEGETABLE SECTION OF ANY GROCERY STORE ) Yogurt, or another dietary source)  IF YOU START THE LEVAQUIN, for a minimum of 3 weeks to prevent a serious antibiotic associated diarrhea  Called clostridium dificile colitis.  Taking a probiotic may also prevent vaginitis due to yeast infections and can be continued indefinitely if you feel that it improves your digestion or your elimination (bowels)..Marland Kitchen

## 2015-09-04 NOTE — Progress Notes (Signed)
Subjective:  Patient ID: Sherri Lee, female    DOB: 04-21-1955  Age: 60 y.o. MRN: [id number]  CC: The primary encounter diagnosis was Respiratory infection. Diagnoses of Musculoskeletal neck pain, Insomnia due to anxiety and fear, Vitamin D deficiency, B12 deficiency, Prediabetes, Hyperlipidemia, Other fatigue, Viral URI, Essential hypertension, DDD (degenerative disc disease), cervical, IBS (irritable bowel syndrome), and Ovarian cyst, left were also pertinent to this visit.  HPI Sherri Lee presents for follow up on chronic conditions including type 2 DM,  And Hypertension.  Her last OV was one year ago.   1) recent fall onto face while [place]  beach.  Fell while ascending a series of steps,, twister her neck and fell face forward onto face resulting in a bruised nose. Went to [facility] ER once she drove home from beach  and CT of head and cervical spine  Were done to rule out fractures. Nose has been congested since then . Developed laryngitis last week..  Using zyrtec D prn.  Has been sneezing more than usual for the past week   No subjective fevers. Mild bitemporal headache which has been present since the fall      2) stomach problems.  Has IBS,  Had gastric bypass, frequent bouts of diarrhea   3) type 2 DM:  Doe snot check sugars very often .  Following a low glycemic index diet.  Not exercising regularly.   Lab Results  Component Value Date   HGBA1C 5.9 09/04/2015     Outpatient Prescriptions Prior to Visit  Medication Sig Dispense Refill  . Biotin 1000 MCG tablet Take 1,000 mcg by mouth 3 (three) times daily.    . calcium carbonate (OS-CAL) 600 MG TABS Take 600 mg by mouth 2 (two) times daily with a meal.    . clonazePAM (KLONOPIN) 1 MG tablet Take 1 mg by mouth 2 (two) times daily as needed.    . gabapentin (NEURONTIN) 100 MG capsule TAKE ONE CAPSULE BY MOUTH 3 TIMES A DAY**NEEDS OFFICE VISIT** 30 capsule 0  . gabapentin (NEURONTIN) 100 MG  capsule LIMIT 1-3 CAPSULES BY MOUTH PER DAY IF TOLERATED 90 capsule 3  . lamoTRIgine (LAMICTAL) 200 MG tablet Take 1 tablet by mouth daily.    . metoprolol succinate (TOPROL-XL) 50 MG 24 hr tablet TAKE 1 TABLET BY MOUTH DAILY WITH OR IMMEDIATELY FOLLOWING A MEAL 90 tablet 1  . Multiple Vitamin (MULTIVITAMIN) tablet Take 1 tablet by mouth daily. Bariatric Vitamin    . sertraline (ZOLOFT) 100 MG tablet Take 100 mg by mouth daily. Reported on 09/04/2015    . traMADol (ULTRAM) 50 MG tablet TAKE 1 TABLET BY MOUTH TWICE DAILY FOR PAIN 60 tablet 4  . dicyclomine (BENTYL) 20 MG tablet TAKE 1 TABLET BY MOUTH EVERY 6 HOURS 120 tablet 1  . traMADol (ULTRAM) 50 MG tablet Take 1 tablet (50 mg total) by mouth every 6 (six) hours as needed. 20 tablet 0  . estradiol-norethindrone (COMBIPATCH) 0.05-0.14 MG/DAY Place 1 patch onto the skin 2 (two) times a week. Reported on 09/04/2015    . folic acid (FOLVITE) 1 MG tablet Take 1 mg by mouth daily. Reported on 09/04/2015    . hyoscyamine (LEVSIN, ANASPAZ) 0.125 MG tablet Take 1 tablet (0.125 mg total) by mouth 2 (two) times daily. (Patient not taking: Reported on 09/04/2015) 60 tablet 1  . montelukast (SINGULAIR) 10 MG tablet Take 10 mg by mouth at bedtime. Reported on 09/04/2015    . penciclovir (DENAVIR)  1 % cream Apply 1 application topically every 2 (two) hours. (Patient not taking: Reported on 09/04/2015) 1.5 g 0  . promethazine (PHENERGAN) 12.5 MG tablet Take 12.5 mg by mouth every 6 (six) hours as needed for nausea. Reported on 09/04/2015    . vitamin B-12 (CYANOCOBALAMIN) 1000 MCG tablet Take 3,000 mcg by mouth daily. Reported on 09/04/2015    . albuterol (PROVENTIL HFA;VENTOLIN HFA) 108 (90 BASE) MCG/ACT inhaler Inhale 2 puffs into the lungs every 6 (six) hours as needed for wheezing or shortness of breath. (Patient not taking: Reported on 09/04/2015) 1 Inhaler 11  . Azelastine-Fluticasone (DYMISTA) 137-50 MCG/ACT SUSP Place 2 Squirts into the nose daily. In each nostril  (Patient not taking: Reported on 09/04/2015) 23 g 11  . HYDROcodone-acetaminophen (NORCO/VICODIN) 5-325 MG per tablet Take 1 tablet by mouth every 6 (six) hours as needed for moderate pain. (Patient not taking: Reported on 09/04/2015) 90 tablet 0  . methocarbamol (ROBAXIN-750) 750 MG tablet Take 1 tablet (750 mg total) by mouth 4 (four) times daily. (Patient not taking: Reported on 09/04/2015) 20 tablet 0  . predniSONE (STERAPRED UNI-PAK 21 TAB) 10 MG (21) TBPK tablet Take 1 tablet (10 mg total) by mouth daily. 6 tablets on day 1. Then decrease by 1 tablet daily until gone. (Patient not taking: Reported on 09/04/2015) 21 tablet 0  . silver sulfADIAZINE (SILVADENE) 1 % cream Apply 1 application topically daily. (Patient not taking: Reported on 09/04/2015) 50 g 0  . spironolactone (ALDACTONE) 12.5 mg TABS Take 0.5 tablets (12.5 mg total) by mouth daily. (Patient not taking: Reported on 09/04/2015) 30 tablet 6  . traZODone (DESYREL) 50 MG tablet Take 0.5-1 tablets (25-50 mg total) by mouth at bedtime as needed for sleep. (Patient not taking: Reported on 09/04/2015) 30 tablet 3   No facility-administered medications prior to visit.    Review of Systems;  Patient denies headache, fevers, malaise, unintentional weight loss, skin rash, eye pain, sinus congestion and sinus pain, sore throat, dysphagia,  hemoptysis , cough, dyspnea, wheezing, chest pain, palpitations, orthopnea, edema, abdominal pain, nausea, melena, diarrhea, constipation, flank pain, dysuria, hematuria, urinary  Frequency, nocturia, numbness, tingling, seizures,  Focal weakness, Loss of consciousness,  Tremor, insomnia, depression, anxiety, and suicidal ideation.      Objective:  BP 120/76 mmHg  Pulse 89  Temp(Src) 98.1 F (36.7 C) (Oral)  Resp 12  Ht  (1.651 m)  Wt 147 lb 12 oz (67.019 kg)  BMI 24.59 kg/m2  SpO2 99%  BP Readings from Last 3 Encounters:  09/04/15 120/76  08/20/15 183/92  02/12/15 122/88    Wt Readings from  Last 3 Encounters:  09/04/15 147 lb 12 oz (67.019 kg)  08/20/15 152 lb (68.947 kg)  02/12/15 157 lb 8 oz (71.442 kg)    General appearance: alert, cooperative and appears stated age Ears: normal TM's and external ear canals both ears Throat: lips, mucosa, and tongue normal; teeth and gums normal Neck: no adenopathy, no carotid bruit, supple, symmetrical, trachea midline and thyroid not enlarged, symmetric, no tenderness/mass/nodules Back: symmetric, no curvature. ROM normal. No CVA tenderness. Lungs: clear to auscultation bilaterally Heart: regular rate and rhythm, S1, S2 normal, no murmur, click, rub or gallop Abdomen: soft, non-tender; bowel sounds normal; no masses,  no organomegaly Pulses: 2+ and symmetric Skin: Skin color, texture, turgor normal. No rashes or lesions Lymph nodes: Cervical, supraclavicular, and axillary nodes normal.  Lab Results  Component Value Date   HGBA1C 5.9 09/04/2015   HGBA1C  5.9 09/10/2014   HGBA1C 6.1 10/28/2013    Lab Results  Component Value Date   CREATININE 0.75 09/04/2015   CREATININE 0.65 09/10/2014   CREATININE 0.85 02/23/2014    Lab Results  Component Value Date   WBC 5.4 09/04/2015   HGB 9.6* 09/04/2015   HCT 30.2* 09/04/2015   PLT 220.0 09/04/2015   GLUCOSE 130* 09/04/2015   CHOL 170 09/04/2015   TRIG 63.0 09/04/2015   HDL 62.20 09/04/2015   LDLDIRECT 99.0 09/04/2015   LDLCALC 95 09/04/2015   ALT 9 09/04/2015   AST 14 09/04/2015   NA 142 09/04/2015   K 3.7 09/04/2015   CL 111 09/04/2015   CREATININE 0.75 09/04/2015   BUN 5* 09/04/2015   CO2 26 09/04/2015   TSH 0.708 09/10/2014   HGBA1C 5.9 09/04/2015   MICROALBUR 0.6 09/04/2015    Ct Head Wo Contrast  08/20/2015  CLINICAL DATA:  Fall yesterday with facial injury to the nose EXAM: CT HEAD WITHOUT CONTRAST CT CERVICAL SPINE WITHOUT CONTRAST TECHNIQUE: Multidetector CT imaging of the head and cervical spine was performed following the standard protocol without intravenous  contrast. Multiplanar CT image reconstructions of the cervical spine were also generated. COMPARISON:  None. FINDINGS: CT HEAD FINDINGS The bony calvarium is intact. No findings to suggest acute hemorrhage, acute infarction or space-occupying mass lesion are noted. CT CERVICAL SPINE FINDINGS Seven cervical segments are well visualized. Interbody fusion with anterior fixation is noted from C4-C7. Very mild osteophytic changes are noted at C3-4 and at C7-T1. Facet hypertrophic changes are seen. No acute facet abnormality or acute fracture is noted. Surrounding soft tissues and visualized lung apices are within normal limits. IMPRESSION: CT of the head:  No acute abnormality noted. CT of the cervical spine: Degenerative changes and prior fusion. No acute abnormality is seen. Electronically Signed   By: Alcide Clever M.D.   On: 08/20/2015 17:59   Ct Cervical Spine Wo Contrast  08/20/2015  CLINICAL DATA:  Pain after trauma. EXAM: CT CERVICAL SPINE WITHOUT CONTRAST TECHNIQUE: Multidetector CT imaging of the cervical spine was performed without intravenous contrast. Multiplanar CT image reconstructions were also generated. COMPARISON:  None. FINDINGS: The cervical spine has been fused from C3 through C7 with anterior plate and screws in addition to bone cages within the disc spaces. No traumatic malalignment is seen. Mild degenerative changes with small anterior osteophytes. Moderate severe facet degenerative changes identified. A calcification distal or overlying the C7 spinous process is chronic in appearance, probably from previous trauma. IMPRESSION: No fracture or traumatic malalignment in the cervical spine. Postsurgical changes and degenerative changes. Electronically Signed   By: Gerome Sam III M.D   On: 08/20/2015 17:58    Assessment & Plan:   Problem List Items Addressed This Visit    Essential hypertension    Well controlled on current regimen. Renal function stable, no changes today.  Lab Results   Component Value Date   CREATININE 0.75 09/04/2015   Lab Results  Component Value Date   NA 142 09/04/2015   K 3.7 09/04/2015   CL 111 09/04/2015   CO2 26 09/04/2015         Ovarian cyst, left    Most recent ultrasound from [facility] Dec 2014 noted a simple cyst on left ovary.  CT of abd and pelvis done in 2015 for RUQ pain noted no ovarian masses but did comment on hazy appearance of the SB mesentary on the left with multiple small LNs present. Marland Kitchen  DDD (degenerative disc disease), cervical    Recurrent with right shoulder and arm pain.  She has had prior Cervical spine surgery and has seen neurosurgery. Previously managed by Ewing SchleinGregory Crisp with tramadol for daytime relief of pain and gabapentin prn        Relevant Medications   predniSONE (STERAPRED UNI-PAK 21 TAB) 10 MG (21) TBPK tablet   traMADol (ULTRAM) 50 MG tablet   RESOLVED: Respiratory infection - Primary    Sinus congesiton,  lcough and laryngitis,  appeastr to be due to persistnet congestion from allergic rhinitis and trauma from recent fall.  Saline risnies,  prednisoen e,  Decongestants.  Add abs x and probioti if needed.       Viral URI    URI is most likely viral given the mild HEENT  Symptoms  And normal exam.   I have explained that in viral URIS, an antibiotic will not help the symptoms and will increase the risk of developing diarrhea.,  Continue oral and nasal decongestants, cough suppressant , tylenol 650 mq 8 hrs for aches and pains, and prednisone  taper for inflammation.  Advised to start abx only if  Unilateral ear or sinus pain develops,  Or patient develops T > 101 or symptoms last > 7 days.  Probiotics also recommended.        IBS (irritable bowel syndrome)    Complicated by gastric bypass.  Dicyclomine refilled.  Her recurrent symptoms of nausea  With  overeating were addressed with advice to avoid overeating.       Relevant Medications   dicyclomine (BENTYL) 20 MG tablet   Musculoskeletal neck pain     Insomnia due to anxiety and fear    Other Visit Diagnoses    Vitamin D deficiency        Relevant Orders    VITAMIN D 25 Hydroxy (Vit-D Deficiency, Fractures) (Completed)    B12 deficiency        Relevant Orders    Vitamin B12 (Completed)    Prediabetes        Relevant Orders    Comprehensive metabolic panel (Completed)    Hemoglobin A1c (Completed)    Microalbumin / creatinine urine ratio (Completed)    Hyperlipidemia        Relevant Orders    LDL cholesterol, direct (Completed)    Lipid panel (Completed)    Other fatigue        Relevant Orders    Ferritin (Completed)    Iron and TIBC    CBC with Differential/Platelet (Completed)       I have discontinued Sherri Lee's spironolactone, albuterol, Azelastine-Fluticasone, traZODone, HYDROcodone-acetaminophen, silver sulfADIAZINE, and methocarbamol. I have also changed her dicyclomine. Additionally, I am having her start on levofloxacin. Lastly, I am having her maintain her sertraline, montelukast, calcium carbonate, multivitamin, estradiol-norethindrone, vitamin B-12, folic acid, Biotin, promethazine, hyoscyamine, clonazePAM, gabapentin, lamoTRIgine, traMADol, metoprolol succinate, penciclovir, gabapentin, zolpidem, predniSONE, and traMADol.  Meds ordered this encounter  Medications  . zolpidem (AMBIEN) 10 MG tablet    Sig: Take 10 mg by mouth at bedtime as needed.    Refill:  2  . predniSONE (STERAPRED UNI-PAK 21 TAB) 10 MG (21) TBPK tablet    Sig: Take 1 tablet (10 mg total) by mouth daily. 6 tablets on day 1. Then decrease by 1 tablet daily until gone.    Dispense:  21 tablet    Refill:  0  . traMADol (ULTRAM) 50 MG tablet    Sig: Take 1 tablet (  50 mg total) by mouth every 6 (six) hours as needed.    Dispense:  60 tablet    Refill:  0  . levofloxacin (LEVAQUIN) 500 MG tablet    Sig: Take 1 tablet (500 mg total) by mouth daily.    Dispense:  7 tablet    Refill:  0  . dicyclomine (BENTYL) 20 MG tablet    Sig: Take 1  tablet (20 mg total) by mouth every 6 (six) hours.    Dispense:  120 tablet    Refill:  1    Medications Discontinued During This Encounter  Medication Reason  . predniSONE (STERAPRED UNI-PAK 21 TAB) 10 MG (21) TBPK tablet Reorder  . traMADol (ULTRAM) 50 MG tablet Reorder  . methocarbamol (ROBAXIN-750) 750 MG tablet   . dicyclomine (BENTYL) 20 MG tablet Reorder  . spironolactone (ALDACTONE) 12.5 mg TABS   . silver sulfADIAZINE (SILVADENE) 1 % cream   . traZODone (DESYREL) 50 MG tablet   . albuterol (PROVENTIL HFA;VENTOLIN HFA) 108 (90 BASE) MCG/ACT inhaler   . Azelastine-Fluticasone (DYMISTA) 137-50 MCG/ACT SUSP   . HYDROcodone-acetaminophen (NORCO/VICODIN) 5-325 MG per tablet     Follow-up: No Follow-up on file.   Sherlene ShamsULLO, Mikaila Grunert L, MD

## 2015-09-04 NOTE — Progress Notes (Signed)
Pre-visit discussion using our clinic review tool. No additional management support is needed unless otherwise documented below in the visit note.  

## 2015-09-04 NOTE — Assessment & Plan Note (Signed)
Sinus congesiton,  lcough and laryngitis,  appeastr to be due to persistnet congestion from allergic rhinitis and trauma from recent fall.  Saline risnies,  prednisoen e,  Decongestants.  Add abs x and probioti if needed.

## 2015-09-06 ENCOUNTER — Encounter: Payer: Self-pay | Admitting: Internal Medicine

## 2015-09-06 DIAGNOSIS — J069 Acute upper respiratory infection, unspecified: Secondary | ICD-10-CM | POA: Insufficient documentation

## 2015-09-06 DIAGNOSIS — K589 Irritable bowel syndrome without diarrhea: Secondary | ICD-10-CM | POA: Insufficient documentation

## 2015-09-06 DIAGNOSIS — D509 Iron deficiency anemia, unspecified: Secondary | ICD-10-CM | POA: Insufficient documentation

## 2015-09-06 NOTE — Assessment & Plan Note (Addendum)
[facility] noted a simple cyst on left ovary.  CT of abd and pelvis done in 2015 for RUQ pain noted no ovarian masses but did comment on hazy appearance of the SB mesentary on the left with multiple small LNs present. .Marland Kitchen

## 2015-09-06 NOTE — Assessment & Plan Note (Signed)
Well controlled on current regimen. Renal function stable, no changes today.  Lab Results  Component Value Date   CREATININE 0.75 09/04/2015   Lab Results  Component Value Date   NA 142 09/04/2015   K 3.7 09/04/2015   CL 111 09/04/2015   CO2 26 09/04/2015

## 2015-09-06 NOTE — Assessment & Plan Note (Signed)
Complicated by gastric bypass.  Dicyclomine refilled.  Her recurrent symptoms of nausea  With  overeating were addressed with advice to avoid overeating.

## 2015-09-06 NOTE — Assessment & Plan Note (Signed)
URI is most likely viral given the mild HEENT  Symptoms  And normal exam.   I have explained that in viral URIS, an antibiotic will not help the symptoms and will increase the risk of developing diarrhea.,  Continue oral and nasal decongestants, cough suppressant , tylenol 650 mq 8 hrs for aches and pains, and prednisone  taper for inflammation.  Advised to start abx only if  Unilateral ear or sinus pain develops,  Or patient develops T > 101 or symptoms last > 7 days.  Probiotics also recommended.   

## 2015-09-06 NOTE — Assessment & Plan Note (Signed)
Historically and currently well-controlled on diet  since bariatric surgery. She is not  up-to-date on eye exam, despitee reminders,  and foot exam is normal today.  Lipid panel is normal as well. Lab Results  Component Value Date   HGBA1C 5.9 09/04/2015   Lab Results  Component Value Date   CHOL 170 09/04/2015   HDL 62.20 09/04/2015   LDLCALC 95 09/04/2015   LDLDIRECT 99.0 09/04/2015   TRIG 63.0 09/04/2015   CHOLHDL 3 09/04/2015

## 2015-09-06 NOTE — Assessment & Plan Note (Addendum)
Recurrent with right shoulder and arm pain.  She has had prior Cervical spine surgery and has seen neurosurgery. Previously managed by Ewing SchleinGregory Crisp with tramadol for daytime relief of pain and gabapentin prn

## 2015-09-11 ENCOUNTER — Encounter: Payer: Self-pay | Admitting: *Deleted

## 2015-09-11 ENCOUNTER — Telehealth: Payer: Self-pay | Admitting: Internal Medicine

## 2015-09-11 NOTE — Telephone Encounter (Signed)
Patient returned call today and patient stated she is not taking any iron supplement . Patient coming in on Monday to pick up stool cards.

## 2015-09-16 ENCOUNTER — Ambulatory Visit: Payer: 59 | Admitting: Internal Medicine

## 2015-09-21 ENCOUNTER — Ambulatory Visit: Payer: 59 | Admitting: Internal Medicine

## 2015-10-02 ENCOUNTER — Other Ambulatory Visit: Payer: Self-pay | Admitting: *Deleted

## 2015-10-02 NOTE — Telephone Encounter (Signed)
Okay to refill with quantity documented on refill request?

## 2015-10-05 ENCOUNTER — Other Ambulatory Visit: Payer: Self-pay | Admitting: Surgical

## 2015-10-05 MED ORDER — TRAMADOL HCL 50 MG PO TABS: 50.0000 mg | ORAL_TABLET | Freq: Four times a day (QID) | ORAL | 4 refills | Status: DC | PRN

## 2015-10-05 MED ORDER — GABAPENTIN 100 MG PO CAPS: ORAL_CAPSULE | ORAL | 1 refills | Status: DC

## 2015-10-05 MED ORDER — DICYCLOMINE HCL 20 MG PO TABS: 20.0000 mg | ORAL_TABLET | Freq: Four times a day (QID) | ORAL | 1 refills | Status: DC

## 2015-10-05 NOTE — Telephone Encounter (Signed)
Please advise on Tramadol refill. Last seen 09/04/15.

## 2015-10-05 NOTE — Telephone Encounter (Signed)
Ok to refill,  Authorized in epic an printed

## 2015-10-05 NOTE — Telephone Encounter (Signed)
Ok to refill,  Refill sent  

## 2015-12-14 ENCOUNTER — Other Ambulatory Visit: Payer: Self-pay | Admitting: Internal Medicine

## 2016-03-11 ENCOUNTER — Other Ambulatory Visit: Payer: Self-pay | Admitting: Internal Medicine

## 2016-05-30 ENCOUNTER — Encounter: Payer: Self-pay | Admitting: Internal Medicine

## 2016-05-30 ENCOUNTER — Ambulatory Visit (INDEPENDENT_AMBULATORY_CARE_PROVIDER_SITE_OTHER): Payer: BLUE CROSS/BLUE SHIELD | Admitting: Internal Medicine

## 2016-05-30 ENCOUNTER — Telehealth: Payer: Self-pay | Admitting: *Deleted

## 2016-05-30 VITALS — BP 138/96 | HR 60 | Temp 98.1°F | Wt 147.0 lb

## 2016-05-30 DIAGNOSIS — F5105 Insomnia due to other mental disorder: Secondary | ICD-10-CM

## 2016-05-30 DIAGNOSIS — D508 Other iron deficiency anemias: Secondary | ICD-10-CM | POA: Diagnosis not present

## 2016-05-30 DIAGNOSIS — F411 Generalized anxiety disorder: Secondary | ICD-10-CM | POA: Diagnosis not present

## 2016-05-30 DIAGNOSIS — R1110 Vomiting, unspecified: Secondary | ICD-10-CM | POA: Diagnosis not present

## 2016-05-30 DIAGNOSIS — L639 Alopecia areata, unspecified: Secondary | ICD-10-CM

## 2016-05-30 DIAGNOSIS — D509 Iron deficiency anemia, unspecified: Secondary | ICD-10-CM

## 2016-05-30 DIAGNOSIS — F409 Phobic anxiety disorder, unspecified: Secondary | ICD-10-CM

## 2016-05-30 DIAGNOSIS — R7303 Prediabetes: Secondary | ICD-10-CM

## 2016-05-30 DIAGNOSIS — R251 Tremor, unspecified: Secondary | ICD-10-CM

## 2016-05-30 DIAGNOSIS — Z9884 Bariatric surgery status: Secondary | ICD-10-CM | POA: Diagnosis not present

## 2016-05-30 DIAGNOSIS — K529 Noninfective gastroenteritis and colitis, unspecified: Secondary | ICD-10-CM | POA: Diagnosis not present

## 2016-05-30 DIAGNOSIS — M5030 Other cervical disc degeneration, unspecified cervical region: Secondary | ICD-10-CM | POA: Diagnosis not present

## 2016-05-30 DIAGNOSIS — R197 Diarrhea, unspecified: Secondary | ICD-10-CM | POA: Diagnosis not present

## 2016-05-30 DIAGNOSIS — F430 Acute stress reaction: Secondary | ICD-10-CM

## 2016-05-30 MED ORDER — GABAPENTIN 100 MG PO CAPS: ORAL_CAPSULE | ORAL | 1 refills | Status: DC

## 2016-05-30 MED ORDER — TRAMADOL HCL 50 MG PO TABS: 50.0000 mg | ORAL_TABLET | Freq: Four times a day (QID) | ORAL | 4 refills | Status: DC | PRN

## 2016-05-30 MED ORDER — ESZOPICLONE 3 MG PO TABS: 3.0000 mg | ORAL_TABLET | Freq: Every day | ORAL | 1 refills | Status: DC

## 2016-05-30 MED ORDER — CLONAZEPAM 1 MG PO TABS: 1.0000 mg | ORAL_TABLET | Freq: Two times a day (BID) | ORAL | 0 refills | Status: DC | PRN

## 2016-05-30 NOTE — Patient Instructions (Signed)
I have refilled your clonazepam and tramadol ,  But I will not refill the ambien because of the risk of overdose with these medications  Trial of Lunesta 3 mg daily at bedtiem for insmonia   Upper GI/small bowel follow through to investigate your post prandial vomiting

## 2016-05-30 NOTE — Telephone Encounter (Signed)
Pt dropped off ifob today. No order found. Please place "future" order.

## 2016-05-30 NOTE — Progress Notes (Signed)
Subjective:  Patient ID: Sherri Lee, female    DOB: 05/29/1955  Age: 61 y.o. MRN: [id number]  CC: The primary encounter diagnosis was Chronic diarrhea of unknown origin. Diagnoses of Tremor, Iron deficiency anemia secondary to inadequate dietary iron intake, Vomiting and diarrhea, History of Roux-en-Y gastric bypass, Insomnia due to anxiety and fear, Status post gastric bypass for obesity, Prediabetes, DDD (degenerative disc disease), cervical, Anxiety as acute reaction to exceptional stress, and Alopecia areata were also pertinent to this visit.  HPI Sherri Lee presents for follow up on multiple issues   1)  diarrhea  Lasting 4 months. Occurs after every meal.  Has been occurring since her gastric bypass surgery,  Has not seen tyner in over a year.  Lost her insurance for several months after her recent divorce  Has been uninsured since divorce in January   Lower abdominal pain constantly. Gets nauseated frequently,  Vomits almost daily , within ten minutes of eating.  Has not tried returning to the protein shake because she has been eating poorer quality food:  sandwiches.  PBJ,  Ham/cheese.  takign bentyl daily and pepto bismol several doses daily   Had gastric bypass in   2013 , tyner,  Has not seen him in several years .  The diarrhea ,  vomitng has been persistent since the surgery,  But much worse,  now aggravated by stress of being single and financially strained with son still living at home who is 22. Her son is  working full time as a Secondary school teacher to [employer],  But he is  addicted to cocaine and methadone .Has stolen thousands of dollars from her and her parents for the past 4 years. 43 yr old son is also a drug abuser  And has a criminal records. Patient requestedin "something stronger" than clonazepam.  Which I have refused based on the home situation and her failure to address the situation   Chronic pain  In shoulders and neck :  She Has  cervical spine disease previously treated by Dr Metta Clines with [facility] .  And prior cervical spine fusion.   Iron deficient anemia. last hgb 9.6 8 months ago.  HAS BEEN OUT OF DAILY IRON Supplement  for several weeks.  Takes gfolic acid and b12 , biotin,  Calcium with vitamin D          Outpatient Medications Prior to Visit  Medication Sig Dispense Refill  . Biotin 1000 MCG tablet Take 1,000 mcg by mouth 3 (three) times daily.    . calcium carbonate (OS-CAL) 600 MG TABS Take 600 mg by mouth 2 (two) times daily with a meal.    . dicyclomine (BENTYL) 20 MG tablet Take 1 tablet (20 mg total) by mouth every 6 (six) hours. 360 tablet 1  . folic acid (FOLVITE) 1 MG tablet Take 1 mg by mouth daily. Reported on 09/04/2015    . lamoTRIgine (LAMICTAL) 200 MG tablet Take 1 tablet by mouth daily.    . metoprolol succinate (TOPROL-XL) 50 MG 24 hr tablet TAKE 1 TABLET BY MOUTH DAILY WITH OR IMMEDIATELY FOLLOWING A MEAL 90 tablet 1  . montelukast (SINGULAIR) 10 MG tablet Take 10 mg by mouth at bedtime. Reported on 09/04/2015    . Multiple Vitamin (MULTIVITAMIN) tablet Take 1 tablet by mouth daily. Bariatric Vitamin    . promethazine (PHENERGAN) 12.5 MG tablet Take 12.5 mg by mouth every 6 (six) hours as needed for nausea. Reported on 09/04/2015    .  sertraline (ZOLOFT) 100 MG tablet Take 100 mg by mouth daily. Reported on 09/04/2015    . traMADol (ULTRAM) 50 MG tablet TAKE 1 TABLET BY MOUTH TWICE DAILY FOR PAIN 60 tablet 4  . traMADol (ULTRAM) 50 MG tablet TAKE 1 TABLET BY MOUTH EVERY 6 HOURS AS NEEDED 60 tablet 0  . vitamin B-12 (CYANOCOBALAMIN) 1000 MCG tablet Take 3,000 mcg by mouth daily. Reported on 09/04/2015    . zolpidem (AMBIEN) 10 MG tablet Take 10 mg by mouth at bedtime as needed.  2  . clonazePAM (KLONOPIN) 1 MG tablet Take 1 mg by mouth 2 (two) times daily as needed.    . gabapentin (NEURONTIN) 100 MG capsule LIMIT 1-3 CAPSULES BY MOUTH PER DAY IF TOLERATED 270 capsule 1  . traMADol (ULTRAM) 50 MG  tablet Take 1 tablet (50 mg total) by mouth every 6 (six) hours as needed. 60 tablet 4  . estradiol-norethindrone (COMBIPATCH) 0.05-0.14 MG/DAY Place 1 patch onto the skin 2 (two) times a week. Reported on 09/04/2015    . hyoscyamine (LEVSIN, ANASPAZ) 0.125 MG tablet Take 1 tablet (0.125 mg total) by mouth 2 (two) times daily. (Patient not taking: Reported on 09/04/2015) 60 tablet 1  . penciclovir (DENAVIR) 1 % cream Apply 1 application topically every 2 (two) hours. (Patient not taking: Reported on 09/04/2015) 1.5 g 0  . levofloxacin (LEVAQUIN) 500 MG tablet Take 1 tablet (500 mg total) by mouth daily. 7 tablet 0  . predniSONE (STERAPRED UNI-PAK 21 TAB) 10 MG (21) TBPK tablet Take 1 tablet (10 mg total) by mouth daily. 6 tablets on day 1. Then decrease by 1 tablet daily until gone. 21 tablet 0   No facility-administered medications prior to visit.     Review of Systems;  Patient denies headache, fevers, malaise, unintentional weight loss, skin rash, eye pain, sinus congestion and sinus pain, sore throat, dysphagia,  hemoptysis , cough, dyspnea, wheezing, chest pain, palpitations, orthopnea, edema, abdominal pain, nausea, melena, diarrhea, constipation, flank pain, dysuria, hematuria, urinary  Frequency, nocturia, numbness, tingling, seizures,  Focal weakness, Loss of consciousness,  Tremor, insomnia, depression, anxiety, and suicidal ideation.      Objective:  BP (!) 138/96 (BP Location: Left Arm, Cuff Size: Normal)   Pulse 60   Temp 98.1 F (36.7 C) (Oral)   Wt 147 lb (66.7 kg)   SpO2 95%   BMI 24.46 kg/m   BP Readings from Last 3 Encounters:  05/30/16 (!) 138/96  09/04/15 120/76  08/20/15 (!) 183/92    Wt Readings from Last 3 Encounters:  05/30/16 147 lb (66.7 kg)  09/04/15 147 lb 12 oz (67 kg)  08/20/15 152 lb (68.9 kg)    General appearance: alert, cooperative and appears stated age.wearing a wig    Ears: normal TM's and external ear canals both ears Throat: lips, mucosa,  and tongue normal; teeth and gums normal Neck: no adenopathy, no carotid bruit, supple, symmetrical, trachea midline and thyroid not enlarged, symmetric, no tenderness/mass/nodules Back: symmetric, no curvature. ROM normal. No CVA tenderness. Lungs: clear to auscultation bilaterally Heart: regular rate and rhythm, S1, S2 normal, no murmur, click, rub or gallop Abdomen: soft, non-tender; bowel sounds normal; no masses,  no organomegaly Pulses: 2+ and symmetric Skin: Skin color, texture, turgor normal. No rashes or lesions Lymph nodes: Cervical, supraclavicular, and axillary nodes normal.  Lab Results  Component Value Date   HGBA1C 5.9 09/04/2015   HGBA1C 5.9 09/10/2014   HGBA1C 6.1 10/28/2013    Lab Results  Component Value Date   CREATININE 0.72 05/30/2016   CREATININE 0.75 09/04/2015   CREATININE 0.65 09/10/2014    Lab Results  Component Value Date   WBC 5.3 05/30/2016   HGB 9.4 (L) 05/30/2016   HCT 29.5 (L) 05/30/2016   PLT 227.0 05/30/2016   GLUCOSE 93 05/30/2016   CHOL 170 09/04/2015   TRIG 63.0 09/04/2015   HDL 62.20 09/04/2015   LDLDIRECT 99.0 09/04/2015   LDLCALC 95 09/04/2015   ALT 9 09/04/2015   AST 14 09/04/2015   NA 140 05/30/2016   K 3.8 05/30/2016   CL 107 05/30/2016   CREATININE 0.72 05/30/2016   BUN 7 05/30/2016   CO2 26 05/30/2016   TSH 0.708 09/10/2014   HGBA1C 5.9 09/04/2015   MICROALBUR 0.6 09/04/2015    Assessment & Plan:   Problem List Items Addressed This Visit    Alopecia areata    Secondary to stress and iatrogenic malnutrition from bypass.  She is now wearing a wig       Anxiety as acute reaction to exceptional stress    Controlled with clonazepam twice daily. Discussed her current home situation and her reluctance to make her son accountable for his behavior, including stealing from her and her parents.       DDD (degenerative disc disease), cervical    Recurrent with right shoulder and arm pain.  She has had prior Cervical spine  surgery and has seen neurosurgery. Previously managed by Ewing Schlein with tramadol for daytime relief of pain and gabapentin prn .  Tramadol refilled.        Relevant Medications   traMADol (ULTRAM) 50 MG tablet   IDA (iron deficiency anemia)   Relevant Orders   Iron and TIBC (Completed)   Ferritin (Completed)   CBC with Differential/Platelet (Completed)   RBC Folate   Zinc   Insomnia due to anxiety and fear    Chronic,ambien dependent,  Aggravated by divorce, financial strains and her son's illegal behavior. I confronted her about her dependence on multiple habit foriming drugs .  and recommended stopping Remus Loffler given her continued use of clonazepam twice daily and advised her to try  Lunesta for insomnia,  since she has had prior trials of trazodone.       Prediabetes    Her diabetes has been in remission since her bypass surgery.       Status post gastric bypass for obesity    Recommended she follow up with Dr. Alva Garnet as she is long overdue and having signs that may be due to a stricture. UGI/SBFT ordered        Other Visit Diagnoses    Chronic diarrhea of unknown origin    -  Primary   Relevant Orders   Magnesium (Completed)   VITAMIN D 25 Hydroxy (Vit-D Deficiency, Fractures) (Completed)   Basic metabolic panel (Completed)   Fecal occult blood, imunochemical   Tremor       Relevant Orders   Vitamin B12 (Completed)   T4 AND TSH   Zinc   Vomiting and diarrhea       Relevant Orders   DG UGI W/SMALL BOWEL   History of Roux-en-Y gastric bypass       Relevant Orders   DG UGI W/SMALL BOWEL      I have discontinued Ms. Hamid's predniSONE and levofloxacin. I have also changed her clonazePAM. Additionally, I am having her start on Eszopiclone. Lastly, I am having her maintain her sertraline, montelukast, calcium carbonate, multivitamin,  estradiol-norethindrone, vitamin B-12, folic acid, Biotin, promethazine, hyoscyamine, lamoTRIgine, traMADol, penciclovir, zolpidem,  dicyclomine, metoprolol succinate, traMADol, traMADol, and gabapentin.  Meds ordered this encounter  Medications  . traMADol (ULTRAM) 50 MG tablet    Sig: Take 1 tablet (50 mg total) by mouth every 6 (six) hours as needed.    Dispense:  60 tablet    Refill:  4  . gabapentin (NEURONTIN) 100 MG capsule    Sig: LIMIT 1-3 CAPSULES BY MOUTH PER DAY IF TOLERATED    Dispense:  270 capsule    Refill:  1  . clonazePAM (KLONOPIN) 1 MG tablet    Sig: Take 1 tablet (1 mg total) by mouth 2 (two) times daily as needed.    Dispense:  60 tablet    Refill:  0  . Eszopiclone 3 MG TABS    Sig: Take 1 tablet (3 mg total) by mouth at bedtime. Take immediately before bedtime    Dispense:  30 tablet    Refill:  1  A total of 40 minutes was spent with patient more than half of which was spent in counseling patient on the above mentioned issues , reviewing and explaining recent labs and imaging studies done, and coordination of care.  Medications Discontinued During This Encounter  Medication Reason  . levofloxacin (LEVAQUIN) 500 MG tablet Error  . predniSONE (STERAPRED UNI-PAK 21 TAB) 10 MG (21) TBPK tablet Error  . traMADol (ULTRAM) 50 MG tablet Reorder  . gabapentin (NEURONTIN) 100 MG capsule Reorder  . clonazePAM (KLONOPIN) 1 MG tablet Reorder    Follow-up: No Follow-up on file.   Sherlene ShamsULLO, Shonta Bourque L, MD

## 2016-05-30 NOTE — Telephone Encounter (Signed)
ifob ordered.  Thanks

## 2016-05-31 DIAGNOSIS — F411 Generalized anxiety disorder: Secondary | ICD-10-CM | POA: Insufficient documentation

## 2016-05-31 DIAGNOSIS — F430 Acute stress reaction: Secondary | ICD-10-CM | POA: Insufficient documentation

## 2016-05-31 LAB — BASIC METABOLIC PANEL
BUN: 7 mg/dL (ref 6–23)
CO2: 26 mEq/L (ref 19–32)
Calcium: 9.2 mg/dL (ref 8.4–10.5)
Chloride: 107 mEq/L (ref 96–112)
Creatinine, Ser: 0.72 mg/dL (ref 0.40–1.20)
GFR: 87.4 mL/min (ref >60.00)
Glucose, Bld: 93 mg/dL (ref 70–99)
Potassium: 3.8 mEq/L (ref 3.5–5.1)
Sodium: 140 mEq/L (ref 135–145)

## 2016-05-31 LAB — CBC WITH DIFFERENTIAL/PLATELET
Basophils Absolute: 0.1 10*3/uL (ref 0.0–0.1)
Basophils Relative: 2.5 % (ref 0.0–3.0)
Eosinophils Absolute: 0.3 10*3/uL (ref 0.0–0.7)
Eosinophils Relative: 6.3 % — ABNORMAL HIGH (ref 0.0–5.0)
HCT: 29.5 % — ABNORMAL LOW (ref 36.0–46.0)
Hemoglobin: 9.4 g/dL — ABNORMAL LOW (ref 12.0–15.0)
Lymphocytes Relative: 41.8 % (ref 12.0–46.0)
Lymphs Abs: 2.2 10*3/uL (ref 0.7–4.0)
MCHC: 31.8 g/dL (ref 30.0–36.0)
MCV: 79.9 fl (ref 78.0–100.0)
Monocytes Absolute: 0.4 10*3/uL (ref 0.1–1.0)
Monocytes Relative: 7.4 % (ref 3.0–12.0)
Neutro Abs: 2.2 10*3/uL (ref 1.4–7.7)
Neutrophils Relative %: 42 % — ABNORMAL LOW (ref 43.0–77.0)
Platelets: 227 10*3/uL (ref 150.0–400.0)
RBC: 3.69 Mil/uL — ABNORMAL LOW (ref 3.87–5.11)
RDW: 14.8 % (ref 11.5–15.5)
WBC: 5.3 10*3/uL (ref 4.0–10.5)

## 2016-05-31 LAB — IRON AND TIBC
%SAT: 15 % (ref 11–50)
Iron: 61 ug/dL (ref 45–160)
TIBC: 396 ug/dL (ref 250–450)
UIBC: 335 ug/dL (ref 125–400)

## 2016-05-31 LAB — VITAMIN B12: Vitamin B-12: 1458 pg/mL — ABNORMAL HIGH (ref 211–911)

## 2016-05-31 LAB — FERRITIN: Ferritin: 6.9 ng/mL — ABNORMAL LOW (ref 10.0–291.0)

## 2016-05-31 LAB — MAGNESIUM: Magnesium: 2 mg/dL (ref 1.5–2.5)

## 2016-05-31 LAB — VITAMIN D 25 HYDROXY (VIT D DEFICIENCY, FRACTURES): VITD: 45.73 ng/mL (ref 30.00–100.00)

## 2016-05-31 NOTE — Assessment & Plan Note (Addendum)
Recommended she follow up with Dr. Alva Garnetyner as she is long overdue and having signs that may be due to a stricture. UGI/SBFT ordered

## 2016-05-31 NOTE — Assessment & Plan Note (Signed)
Her diabetes has been in remission since her bypass surgery.

## 2016-05-31 NOTE — Assessment & Plan Note (Signed)
Secondary to stress and iatrogenic malnutrition from bypass.  She is now wearing a wig

## 2016-05-31 NOTE — Assessment & Plan Note (Addendum)
Chronic,ambien dependent,  Aggravated by divorce, financial strains and her son's illegal behavior. I confronted her about her dependence on multiple habit foriming drugs .  and recommended stopping Remus Lofflerambien given her continued use of clonazepam twice daily and advised her to try  Lunesta for insomnia,  since she has had prior trials of trazodone.

## 2016-05-31 NOTE — Assessment & Plan Note (Addendum)
Controlled with clonazepam twice daily. Discussed her current home situation and her reluctance to make her son accountable for his behavior, including stealing from her and her parents.

## 2016-05-31 NOTE — Telephone Encounter (Signed)
Had to reorder because it wasn't ordered as future. Any orders placed through a phone note MUST be ordered as future or it can not be seen or released in the lab.

## 2016-05-31 NOTE — Assessment & Plan Note (Signed)
Recurrent with right shoulder and arm pain.  She has had prior Cervical spine surgery and has seen neurosurgery. Previously managed by Ewing SchleinGregory Crisp with tramadol for daytime relief of pain and gabapentin prn .  Tramadol refilled.

## 2016-06-01 LAB — FOLATE RBC: RBC Folate: 908 ng/mL (ref >280)

## 2016-06-01 LAB — T4 AND TSH
T4, Total: 6.1 ug/dL (ref 4.5–12.0)
TSH: 0.647 u[IU]/mL (ref 0.450–4.500)

## 2016-06-02 ENCOUNTER — Other Ambulatory Visit: Payer: BLUE CROSS/BLUE SHIELD

## 2016-06-03 ENCOUNTER — Telehealth: Payer: Self-pay

## 2016-06-03 ENCOUNTER — Other Ambulatory Visit: Payer: BLUE CROSS/BLUE SHIELD

## 2016-06-03 ENCOUNTER — Other Ambulatory Visit: Payer: Self-pay | Admitting: Internal Medicine

## 2016-06-03 DIAGNOSIS — D649 Anemia, unspecified: Secondary | ICD-10-CM

## 2016-06-03 LAB — FECAL OCCULT BLOOD, IMMUNOCHEMICAL: Fecal Occult Bld: NEGATIVE

## 2016-06-03 NOTE — Telephone Encounter (Signed)
-----   Message from Sherlene Shamseresa L Tullo, MD sent at 06/02/2016  3:21 PM EDT ----- your hemoglobin is still low at 9.4,  But your iron. B12 and thyroid and folate levels are normal. I would like you to see a hematologist for further workup and will arrange a referral If you agree.  Regards,   Duncan Dulleresa Tullo, MD

## 2016-06-03 NOTE — Telephone Encounter (Signed)
Called all numbers listed in chart all either d/c or mailbox was full.

## 2016-06-07 NOTE — Telephone Encounter (Signed)
Hematology referral is in process

## 2016-06-07 NOTE — Telephone Encounter (Signed)
Spoke with pt and she stated that she is ok with the referral to a hematologist.

## 2016-06-08 ENCOUNTER — Encounter: Payer: Self-pay | Admitting: Internal Medicine

## 2016-06-16 ENCOUNTER — Ambulatory Visit
Admission: RE | Admit: 2016-06-16 | Discharge: 2016-06-16 | Disposition: A | Discharge status: Home / Self Care | Payer: BLUE CROSS/BLUE SHIELD | Source: Ambulatory Visit | Attending: Internal Medicine | Admitting: Internal Medicine

## 2016-06-16 DIAGNOSIS — K222 Esophageal obstruction: Secondary | ICD-10-CM | POA: Diagnosis not present

## 2016-06-16 DIAGNOSIS — Z9884 Bariatric surgery status: Secondary | ICD-10-CM

## 2016-06-16 DIAGNOSIS — K5710 Diverticulosis of small intestine without perforation or abscess without bleeding: Secondary | ICD-10-CM | POA: Insufficient documentation

## 2016-06-16 DIAGNOSIS — K449 Diaphragmatic hernia without obstruction or gangrene: Secondary | ICD-10-CM | POA: Insufficient documentation

## 2016-06-16 DIAGNOSIS — K224 Dyskinesia of esophagus: Secondary | ICD-10-CM | POA: Insufficient documentation

## 2016-06-16 DIAGNOSIS — R1110 Vomiting, unspecified: Secondary | ICD-10-CM | POA: Diagnosis not present

## 2016-06-16 DIAGNOSIS — R197 Diarrhea, unspecified: Secondary | ICD-10-CM | POA: Diagnosis not present

## 2016-06-19 DIAGNOSIS — K222 Esophageal obstruction: Secondary | ICD-10-CM | POA: Insufficient documentation

## 2016-06-19 NOTE — Assessment & Plan Note (Signed)
Patient advised to follow up with her bariatric surgeon for endoscopy

## 2016-06-19 NOTE — Assessment & Plan Note (Signed)
anastamosis was intact and afferent loop did not fill by recent UGI SBFT April 2018

## 2016-06-20 ENCOUNTER — Telehealth: Payer: Self-pay | Admitting: *Deleted

## 2016-06-20 ENCOUNTER — Telehealth: Payer: Self-pay

## 2016-06-20 NOTE — Telephone Encounter (Signed)
Attempted to call pt. Number provided below states that it has been disconnected and cell number does not have a voicemail setup.

## 2016-06-20 NOTE — Telephone Encounter (Signed)
-----   Message from [name], MD sent at 06/19/2016  8:03 AM EDT ----- She has a stricture at the gastroesophageal junction that is limiting her ability to swallow food.  She needs to see her bariatric surgeon for an endoscopy and possible dilation .  If she needs a referral let me know

## 2016-06-20 NOTE — Telephone Encounter (Signed)
Pt requested test results  Pt contact [phone]

## 2016-06-20 NOTE — Telephone Encounter (Signed)
Documented in result notes message.  

## 2016-06-21 ENCOUNTER — Other Ambulatory Visit: Payer: Self-pay | Admitting: Internal Medicine

## 2016-06-21 DIAGNOSIS — Z9884 Bariatric surgery status: Secondary | ICD-10-CM

## 2016-06-21 DIAGNOSIS — K222 Esophageal obstruction: Secondary | ICD-10-CM

## 2016-06-21 DIAGNOSIS — K449 Diaphragmatic hernia without obstruction or gangrene: Secondary | ICD-10-CM

## 2016-06-22 ENCOUNTER — Ambulatory Visit: Payer: BLUE CROSS/BLUE SHIELD | Admitting: Oncology

## 2016-06-23 ENCOUNTER — Telehealth: Payer: Self-pay

## 2016-06-23 NOTE — Telephone Encounter (Signed)
-----   Message from [name], MD sent at 06/21/2016  5:27 PM EDT -----  Your referral is in process as requested to Dr tyner. . Our referral coordinator will call you when the appointment has been made.  If you do not hear from [place] or the referred provider in the next 5 days,   Please call us back

## 2016-06-23 NOTE — Telephone Encounter (Signed)
Have attempted to call pt several times on home, cell and work numbers. Have not received a call back. Will try again later.

## 2016-06-24 ENCOUNTER — Inpatient Hospital Stay: Payer: BLUE CROSS/BLUE SHIELD

## 2016-06-24 ENCOUNTER — Encounter: Payer: Self-pay | Admitting: Oncology

## 2016-06-24 ENCOUNTER — Inpatient Hospital Stay: Payer: BLUE CROSS/BLUE SHIELD | Attending: Oncology | Admitting: Oncology

## 2016-06-24 VITALS — BP 130/83 | HR 65 | Temp 97.3°F | Resp 18 | Ht 65.0 in | Wt 149.2 lb

## 2016-06-24 DIAGNOSIS — G4700 Insomnia, unspecified: Secondary | ICD-10-CM | POA: Insufficient documentation

## 2016-06-24 DIAGNOSIS — M5030 Other cervical disc degeneration, unspecified cervical region: Secondary | ICD-10-CM | POA: Insufficient documentation

## 2016-06-24 DIAGNOSIS — I10 Essential (primary) hypertension: Secondary | ICD-10-CM | POA: Insufficient documentation

## 2016-06-24 DIAGNOSIS — R7303 Prediabetes: Secondary | ICD-10-CM | POA: Diagnosis not present

## 2016-06-24 DIAGNOSIS — Z9884 Bariatric surgery status: Secondary | ICD-10-CM | POA: Diagnosis not present

## 2016-06-24 DIAGNOSIS — Z79899 Other long term (current) drug therapy: Secondary | ICD-10-CM | POA: Diagnosis not present

## 2016-06-24 DIAGNOSIS — D509 Iron deficiency anemia, unspecified: Secondary | ICD-10-CM | POA: Diagnosis present

## 2016-06-24 DIAGNOSIS — K589 Irritable bowel syndrome without diarrhea: Secondary | ICD-10-CM | POA: Insufficient documentation

## 2016-06-24 DIAGNOSIS — F419 Anxiety disorder, unspecified: Secondary | ICD-10-CM | POA: Diagnosis not present

## 2016-06-24 DIAGNOSIS — D649 Anemia, unspecified: Secondary | ICD-10-CM

## 2016-06-24 LAB — CBC
HCT: 32.2 % — ABNORMAL LOW (ref 35.0–47.0)
Hemoglobin: 10.3 g/dL — ABNORMAL LOW (ref 12.0–16.0)
MCH: 25.1 pg — ABNORMAL LOW (ref 26.0–34.0)
MCHC: 31.9 g/dL — ABNORMAL LOW (ref 32.0–36.0)
MCV: 78.9 fL — ABNORMAL LOW (ref 80.0–100.0)
Platelets: 217 10*3/uL (ref 150–440)
RBC: 4.08 MIL/uL (ref 3.80–5.20)
RDW: 15.4 % — ABNORMAL HIGH (ref 11.5–14.5)
WBC: 6.1 10*3/uL (ref 3.6–11.0)

## 2016-06-24 LAB — RETICULOCYTES
RBC.: 4.08 MIL/uL (ref 3.80–5.20)
Retic Count, Absolute: 49 10*3/uL (ref 19.0–183.0)
Retic Ct Pct: 1.2 % (ref 0.4–3.1)

## 2016-06-24 NOTE — Progress Notes (Signed)
Patient is here for follow up, she is unsure as to why she is here.

## 2016-06-25 LAB — HAPTOGLOBIN: Haptoglobin: 112 mg/dL (ref 34–200)

## 2016-06-25 LAB — COPPER, SERUM: Copper: 115 ug/dL (ref 72–166)

## 2016-06-27 ENCOUNTER — Encounter: Payer: Self-pay | Admitting: Oncology

## 2016-06-27 NOTE — Progress Notes (Signed)
Hematology/Oncology Consult note [facility] Telephone:([phone] Fax:([phone]) [phone]  Patient Care Team: [name], MD as PCP - General (Internal Medicine)   Name of the patient: Sherri Lee  [id number]  [birth year]    Reason for referral- iron deficiency anemia   Referring physician- Dr. Darrick Huntsman  Date of visit: 06/27/16   History of presenting illness- Patient is a 61 year old female with a history of Roux-en-Y gastric bypass in 2013. She is not seen bariatric surgery for a while now. Patient has been referred to [place] for microcytic anemia. Most recent CBC from 05/30/2016 showed white count of 5.3, H&H of 9.4/29.5 with an MCV of 79.9 and a platelet count of 227. B12 level was elevated at 1458 and folate was within normal limits. Ferritin was low at 6.9. Iron studies were within normal limits. TSH was within normal limits. Patient has not had recent EGD or colonoscopy. After her iron levels were noted to eb low, patient was started on oral iron about a week ago which is tolerating well without any significant side effects. She denies any regular use of NSAIDS. Denies any blood in stool or urine  ECOG PS- 0  Pain scale- 0   Review of systems- Review of Systems  Constitutional: Positive for malaise/fatigue. Negative for chills, fever and weight loss.  HENT: Negative for congestion, ear discharge and nosebleeds.   Eyes: Negative for blurred vision.  Respiratory: Negative for cough, hemoptysis, sputum production, shortness of breath and wheezing.   Cardiovascular: Negative for chest pain, palpitations, orthopnea and claudication.  Gastrointestinal: Negative for abdominal pain, blood in stool, constipation, diarrhea, heartburn, melena, nausea and vomiting.  Genitourinary: Negative for dysuria, flank pain, frequency, hematuria and urgency.  Musculoskeletal: Negative for back pain, joint pain and myalgias.  Skin: Negative for rash.  Neurological: Negative  for dizziness, tingling, focal weakness, seizures, weakness and headaches.  Endo/Heme/Allergies: Does not bruise/bleed easily.  Psychiatric/Behavioral: Negative for depression and suicidal ideas. The patient does not have insomnia.     Allergies  Allergen Reactions  . Betadine [Povidone Iodine] Rash    Patient Active Problem List   Diagnosis Date Noted  . Esophageal stricture 06/19/2016  . Anxiety as acute reaction to exceptional stress 05/31/2016  . IBS (irritable bowel syndrome) 09/06/2015  . IDA (iron deficiency anemia) 09/06/2015  . Musculoskeletal neck pain 09/04/2015  . DDD (degenerative disc disease), cervical 09/24/2014  . Intercostal neuralgia 09/24/2014  . DDD (degenerative disc disease), thoracic 09/24/2014  . Tremor of both hands 09/10/2014  . Insomnia due to anxiety and fear 02/26/2013  . Ovarian cyst, left 08/09/2012  . Alopecia areata 07/11/2012  . Cervicalgia 07/11/2012  . Thoracic spine pain 10/04/2011  . Essential hypertension 05/10/2011  . Prediabetes 05/10/2011  . Status post gastric bypass for obesity 05/10/2011     Past Medical History:  Diagnosis Date  . Anxiety   . Asthma   . Depression   . Hypertension   . Schatzki's ring    dialated 3 times by Markham Jordan      Past Surgical History:  Procedure Laterality Date  . ABDOMINAL ADHESION SURGERY    . BUNIONECTOMY     left foot   . CERVICAL FUSION    . CESAREAN SECTION    . CHOLECYSTECTOMY  June 2013  . GASTRIC BYPASS      Social History   Social History  . Marital status: Married    Spouse name: N/A  . Number of children: N/A  . Years of  education: N/A   Occupational History  . Not on file.   Social History Main Topics  . Smoking status: Never Smoker  . Smokeless tobacco: Never Used  . Alcohol use No  . Drug use: No  . Sexual activity: Yes   Other Topics Concern  . Not on file   Social History Narrative   Lives with spouse, son, and stepson who has serious psychiatric issues  and alcohol abuse     Family History  Problem Relation Age of Onset  . Diabetes Mother   . Hypertension Mother   . Heart disease Father   . Breast cancer Maternal Aunt   . Cancer Maternal Aunt   . Colon cancer Neg Hx   . Ovarian cancer Neg Hx      Current Outpatient Prescriptions:  .  Biotin 1000 MCG tablet, Take 1,000 mcg by mouth 3 (three) times daily., Disp: , Rfl:  .  calcium carbonate (OS-CAL) 600 MG TABS, Take 600 mg by mouth 2 (two) times daily with a meal., Disp: , Rfl:  .  clonazePAM (KLONOPIN) 1 MG tablet, Take 1 tablet (1 mg total) by mouth 2 (two) times daily as needed., Disp: 60 tablet, Rfl: 0 .  dicyclomine (BENTYL) 20 MG tablet, Take 1 tablet (20 mg total) by mouth every 6 (six) hours., Disp: 360 tablet, Rfl: 1 .  folic acid (FOLVITE) 1 MG tablet, Take 1 mg by mouth daily. Reported on 09/04/2015, Disp: , Rfl:  .  gabapentin (NEURONTIN) 100 MG capsule, LIMIT 1-3 CAPSULES BY MOUTH PER DAY IF TOLERATED, Disp: 270 capsule, Rfl: 1 .  lamoTRIgine (LAMICTAL) 200 MG tablet, Take 1 tablet by mouth daily., Disp: , Rfl:  .  metoprolol succinate (TOPROL-XL) 50 MG 24 hr tablet, TAKE 1 TABLET BY MOUTH DAILY WITH OR IMMEDIATELY FOLLOWING A MEAL, Disp: 90 tablet, Rfl: 1 .  Multiple Vitamin (MULTIVITAMIN) tablet, Take 1 tablet by mouth daily. Bariatric Vitamin, Disp: , Rfl:  .  traMADol (ULTRAM) 50 MG tablet, Take 1 tablet (50 mg total) by mouth every 6 (six) hours as needed., Disp: 60 tablet, Rfl: 4 .  vitamin B-12 (CYANOCOBALAMIN) 1000 MCG tablet, Take 3,000 mcg by mouth daily. Reported on 09/04/2015, Disp: , Rfl:  .  Eszopiclone 3 MG TABS, Take 1 tablet (3 mg total) by mouth at bedtime. Take immediately before bedtime (Patient not taking: Reported on 06/24/2016), Disp: 30 tablet, Rfl: 1 .  hyoscyamine (LEVSIN, ANASPAZ) 0.125 MG tablet, Take 1 tablet (0.125 mg total) by mouth 2 (two) times daily. (Patient not taking: Reported on 09/04/2015), Disp: 60 tablet, Rfl: 1 .  montelukast (SINGULAIR)  10 MG tablet, Take 10 mg by mouth at bedtime. Reported on 09/04/2015, Disp: , Rfl:  .  penciclovir (DENAVIR) 1 % cream, Apply 1 application topically every 2 (two) hours. (Patient not taking: Reported on 09/04/2015), Disp: 1.5 g, Rfl: 0 .  promethazine (PHENERGAN) 12.5 MG tablet, Take 12.5 mg by mouth every 6 (six) hours as needed for nausea. Reported on 09/04/2015, Disp: , Rfl:  .  sertraline (ZOLOFT) 100 MG tablet, Take 100 mg by mouth daily. Reported on 09/04/2015, Disp: , Rfl:    Physical exam:  Vitals:   06/24/16 1614  BP: 130/83  Pulse: 65  Resp: 18  Temp: 97.3 F (36.3 C)  TempSrc: Tympanic  Weight: 149 lb 3.2 oz (67.7 kg)  Height:  (1.651 m)   Physical Exam  Constitutional: She is oriented to person, place, and time and well-developed, well-nourished, and  in no distress.  HENT:  Head: Normocephalic and atraumatic.  Eyes: EOM are normal. Pupils are equal, round, and reactive to light.  Neck: Normal range of motion.  Cardiovascular: Normal rate, regular rhythm and normal heart sounds.   Pulmonary/Chest: Effort normal and breath sounds normal.  Abdominal: Soft. Bowel sounds are normal.  Neurological: She is alert and oriented to person, place, and time.  Skin: Skin is warm and dry.       CMP Latest Ref Rng & Units 05/30/2016  Glucose 70 - 99 mg/dL 93  BUN 6 - 23 mg/dL 7  Creatinine 1.61 - 0.96 mg/dL 0.45  Sodium 409 - 811 mEq/L 140  Potassium 3.5 - 5.1 mEq/L 3.8  Chloride 96 - 112 mEq/L 107  CO2 19 - 32 mEq/L 26  Calcium 8.4 - 10.5 mg/dL 9.2  Total Protein 6.0 - 8.3 g/dL -  Total Bilirubin 0.2 - 1.2 mg/dL -  Alkaline Phos 39 - 914 U/L -  AST 0 - 37 U/L -  ALT 0 - 35 U/L -   CBC Latest Ref Rng & Units 06/24/2016  WBC 3.6 - 11.0 K/uL 6.1  Hemoglobin 12.0 - 16.0 g/dL 10.3(L)  Hematocrit 35.0 - 47.0 % 32.2(L)  Platelets 150 - 440 K/uL 217    No images are attached to the encounter.  Dg Kayleen Memos W/small Bowel  Result Date: 06/16/2016 CLINICAL DATA:  Nausea vomiting  and diarrhea for 6 months. History of Roux-en-Y gastric bypass 5 years ago. History of previous esophageal dilation. EXAM: UPPER GI SERIES WITH SMALL BOWEL FOLLOW-THROUGH using thick and thin barium FLUOROSCOPY TIME:  Fluoroscopy Time:  1 minutes, 36 seconds Radiation Exposure Index (if provided by the fluoroscopic device): 1186 micro Gy per meters square Number of Acquired Spot Images: 14+ 2 KUB radiographs TECHNIQUE: Combined double contrast and single contrast upper GI series using effervescent crystals, thick barium, and thin barium. Subsequently, serial images of the small bowel were obtained including spot views of the terminal ileum. After obtaining a scout radiograph a routine upper GI series was performed using thin and high density barium. Effervescent crystals and a barium tablet were administered. COMPARISON:  CT scan of the abdomen and pelvis of February 23, 2014 FINDINGS: The scout film revealed a normal bowel gas pattern. The patient ingested the thick and thin barium and gas forming crystals with moderate difficulty due to nausea. The esophagus distended normally. Emptying of the esophagus was intermittently mildly delayed suggesting mild motility disturbance. A small reducible hiatal hernia was observed. The barium tablet hung at the GE junction and did not enter the gastric remnant despite additional sips of barium and water. The gastric remnant appeared normal. The anastomosis was patent. A small diverticulum at the site of the anastomosis was observed. Filling of the efferent loop of the jejunum was prompt. The afferent loop did not fill. The jejunal loops demonstrated a normal mucosal pattern. The visualized ileal loops also were grossly normal. Over the course of the approximately 20 minutes of the study the barium reached the right colon. IMPRESSION: Mild esophageal dysmotility. Narrowing of the GE junction at the site of a reducible small hiatal hernia would not allow passage of the barium  tablet. Direct visualization and consideration for repeat dilation would be useful. The gastric remnant appear normal with prompt emptying. A small diverticulum at the site of the anastomosis was observed. Filling of the afferent loop of the Roux-en Y did not occur. The efferent loop appeared normal. The remainder the jejunum  and the ileum exhibited no acute abnormalities. There is fairly rapid transit of the barium through the small bowel. Electronically Signed   By: David  [place] M.D.   On: 06/16/2016 09:22    Assessment and plan- Patient is a 61 y.o. female with h/o iron deficiency anemia likely from gastric bypass   Patient has just started taking PO iron about a week ago. I will repeat cbc and iron studies in a months time to see if she is responding to oral iron. If not, I will start her on IV iron. I will also get cbc, myeloma panel, retic count, haptoglobin and serum copper today to complete her anemia evaluation. She may need GI evaluation in the future   Thank you for this kind referral and the opportunity to participate in the care of this patient   Visit Diagnosis 1. Iron deficiency anemia, unspecified iron deficiency anemia type     Dr. Owens Shark, MD, MPH [facility] Pager- [phone] 06/27/2016

## 2016-06-28 LAB — MULTIPLE MYELOMA PANEL, SERUM
Albumin SerPl Elph-Mcnc: 3.3 g/dL (ref 2.9–4.4)
Albumin/Glob SerPl: 1.2 (ref 0.7–1.7)
Alpha 1: 0.2 g/dL (ref 0.0–0.4)
Alpha2 Glob SerPl Elph-Mcnc: 0.7 g/dL (ref 0.4–1.0)
B-Globulin SerPl Elph-Mcnc: 1.2 g/dL (ref 0.7–1.3)
Gamma Glob SerPl Elph-Mcnc: 0.8 g/dL (ref 0.4–1.8)
Globulin, Total: 2.9 g/dL (ref 2.2–3.9)
IgA: 336 mg/dL (ref 87–352)
IgG (Immunoglobin G), Serum: 874 mg/dL (ref 700–1600)
IgM, Serum: 91 mg/dL (ref 26–217)
Total Protein ELP: 6.2 g/dL (ref 6.0–8.5)

## 2016-07-22 ENCOUNTER — Inpatient Hospital Stay: Payer: BLUE CROSS/BLUE SHIELD | Attending: Oncology

## 2016-07-22 ENCOUNTER — Inpatient Hospital Stay (HOSPITAL_BASED_OUTPATIENT_CLINIC_OR_DEPARTMENT_OTHER): Payer: BLUE CROSS/BLUE SHIELD | Admitting: Oncology

## 2016-07-22 ENCOUNTER — Encounter: Payer: Self-pay | Admitting: Anesthesiology

## 2016-07-22 VITALS — BP 125/83 | HR 65 | Temp 98.1°F | Resp 20 | Wt 149.2 lb

## 2016-07-22 DIAGNOSIS — Z79899 Other long term (current) drug therapy: Secondary | ICD-10-CM

## 2016-07-22 DIAGNOSIS — I10 Essential (primary) hypertension: Secondary | ICD-10-CM | POA: Insufficient documentation

## 2016-07-22 DIAGNOSIS — M797 Fibromyalgia: Secondary | ICD-10-CM | POA: Insufficient documentation

## 2016-07-22 DIAGNOSIS — D509 Iron deficiency anemia, unspecified: Secondary | ICD-10-CM

## 2016-07-22 DIAGNOSIS — D508 Other iron deficiency anemias: Secondary | ICD-10-CM

## 2016-07-22 DIAGNOSIS — J45909 Unspecified asthma, uncomplicated: Secondary | ICD-10-CM | POA: Insufficient documentation

## 2016-07-22 DIAGNOSIS — Z9884 Bariatric surgery status: Secondary | ICD-10-CM | POA: Diagnosis not present

## 2016-07-22 DIAGNOSIS — F329 Major depressive disorder, single episode, unspecified: Secondary | ICD-10-CM | POA: Insufficient documentation

## 2016-07-22 LAB — IRON AND TIBC
Iron: 63 ug/dL (ref 28–170)
Saturation Ratios: 16 % (ref 10.4–31.8)
TIBC: 402 ug/dL (ref 250–450)
UIBC: 339 ug/dL

## 2016-07-22 LAB — CBC
HCT: 32.7 % — ABNORMAL LOW (ref 35.0–47.0)
Hemoglobin: 10.7 g/dL — ABNORMAL LOW (ref 12.0–16.0)
MCH: 26.2 pg (ref 26.0–34.0)
MCHC: 32.6 g/dL (ref 32.0–36.0)
MCV: 80.2 fL (ref 80.0–100.0)
Platelets: 238 10*3/uL (ref 150–440)
RBC: 4.08 MIL/uL (ref 3.80–5.20)
RDW: 16.8 % — ABNORMAL HIGH (ref 11.5–14.5)
WBC: 7.7 10*3/uL (ref 3.6–11.0)

## 2016-07-22 LAB — FERRITIN: Ferritin: 9 ng/mL — ABNORMAL LOW (ref 11–307)

## 2016-07-22 NOTE — Discharge Instructions (Signed)
Cataract Surgery, Care After °Refer to this sheet in the next few weeks. These instructions provide you with information about caring for yourself after your procedure. Your health care provider may also give you more specific instructions. Your treatment has been planned according to current medical practices, but problems sometimes occur. Call your health care provider if you have any problems or questions after your procedure. °What can I expect after the procedure? °After the procedure, it is common to have: °· Itching. °· Discomfort. °· Fluid discharge. °· Sensitivity to light and to touch. °· Bruising. °Follow these instructions at home: °Eye Care  °· Check your eye every day for signs of infection. Watch for: °¨ Redness, swelling, or pain. °¨ Fluid, blood, or pus. °¨ Warmth. °¨ Bad smell. °Activity  °· Avoid strenuous activities, such as playing contact sports, for as long as told by your health care provider. °· Do not drive or operate heavy machinery until your health care provider approves. °· Do not bend or lift heavy objects . Bending increases pressure in the eye. You can walk, climb stairs, and do light household chores. °· Ask your health care provider when you can return to work. If you work in a dusty environment, you may be advised to wear protective eyewear for a period of time. °General instructions  °· Take or apply over-the-counter and prescription medicines only as told by your health care provider. This includes eye drops. °· Do not touch or rub your eyes. °· If you were given a protective shield, wear it as told by your health care provider. If you were not given a protective shield, wear sunglasses as told by your health care provider to protect your eyes. °· Keep the area around your eye clean and dry. Avoid swimming or allowing water to hit you directly in the face while showering until told by your health care provider. Keep soap and shampoo out of your eyes. °· Do not put a contact lens  into the affected eye or eyes until your health care provider approves. °· Keep all follow-up visits as told by your health care provider. This is important. °Contact a health care provider if: ° °· You have increased bruising around your eye. °· You have pain that is not helped with medicine. °· You have a fever. °· You have redness, swelling, or pain in your eye. °· You have fluid, blood, or pus coming from your incision. °· Your vision gets worse. °Get help right away if: °· You have sudden vision loss. °This information is not intended to replace advice given to you by your health care provider. Make sure you discuss any questions you have with your health care provider. °Document Released: 09/17/2004 Document Revised: 07/09/2015 Document Reviewed: 01/08/2015 °Elsevier Interactive Patient Education © 2017 [employer] ° ° ° ° °General Anesthesia, Adult, Care After °These instructions provide you with information about caring for yourself after your procedure. Your health care provider may also give you more specific instructions. Your treatment has been planned according to current medical practices, but problems sometimes occur. Call your health care provider if you have any problems or questions after your procedure. °What can I expect after the procedure? °After the procedure, it is common to have: °· Vomiting. °· A sore throat. °· Mental slowness. °It is common to feel: °· Nauseous. °· Cold or shivery. °· Sleepy. °· Tired. °· Sore or achy, even in parts of your body where you did not have surgery. °Follow these instructions at   home: °For at least 24 hours after the procedure:  °· Do not: °¨ Participate in activities where you could fall or become injured. °¨ Drive. °¨ Use heavy machinery. °¨ Drink alcohol. °¨ Take sleeping pills or medicines that cause drowsiness. °¨ Make important decisions or sign legal documents. °¨ Take care of children on your own. °· Rest. °Eating and drinking  °· If you vomit, drink  water, juice, or soup when you can drink without vomiting. °· Drink enough fluid to keep your urine clear or pale yellow. °· Make sure you have little or no nausea before eating solid foods. °· Follow the diet recommended by your health care provider. °General instructions  °· Have a responsible adult stay with you until you are awake and alert. °· Return to your normal activities as told by your health care provider. Ask your health care provider what activities are safe for you. °· Take over-the-counter and prescription medicines only as told by your health care provider. °· If you smoke, do not smoke without supervision. °· Keep all follow-up visits as told by your health care provider. This is important. °Contact a health care provider if: °· You continue to have nausea or vomiting at home, and medicines are not helpful. °· You cannot drink fluids or start eating again. °· You cannot urinate after 8-12 hours. °· You develop a skin rash. °· You have fever. °· You have increasing redness at the site of your procedure. °Get help right away if: °· You have difficulty breathing. °· You have chest pain. °· You have unexpected bleeding. °· You feel that you are having a life-threatening or urgent problem. °This information is not intended to replace advice given to you by your health care provider. Make sure you discuss any questions you have with your health care provider. °Document Released: 06/06/2000 Document Revised: 08/03/2015 Document Reviewed: 02/12/2015 °Elsevier Interactive Patient Education © 2017 [employer] ° °

## 2016-07-22 NOTE — Progress Notes (Signed)
Hematology/Oncology Consult note [facility]  Telephone:([phone] [phone]  Patient Care Team: Crecencio Mc, MD as PCP - General (Internal Medicine)   Name of the patient: Sherri Lee  [id number]  04-10-1955   Date of visit: 07/22/16  Diagnosis- iron deficiency anemia likely due to gastric bypass  Chief complaint/ Reason for visit- routine f/u  Heme/Onc history: Patient is a 61 year old female with a history of Roux-en-Y gastric bypass in 2013. She is not seen bariatric surgery for a while now. Patient has been referred to [place] for microcytic anemia. Most recent CBC from 05/30/2016 showed white count of 5.3, H&H of 9.4/29.5 with an MCV of 79.9 and a platelet count of 227. B12 level was elevated at 1458 and folate was within normal limits. Ferritin was low at 6.9. Iron studies were within normal limits. TSH was within normal limits. Patient has not had recent EGD or colonoscopy. After her iron levels were noted to be low, patient was started on oral iron about a week ago which is tolerating well without any significant side effects. She denies any regular use of NSAIDS. Denies any blood in stool or urine  Blood work from 06/24/2016 was as follows: CBC showed white count of 6.1, H&H of 10.3/32.2 with an MCV of 78.9 and a platelet count of 217. Haptoglobin was normal at 112 and reticulocyte count was low normal at 1.2% serum copper was normal at 115 multiple myeloma panel did not reveal any monoclonal protein  Interval history- reports anxiety due to her sons substance abuse issues. Denies other complaints    Review of systems- Review of Systems  Constitutional: Negative for chills, fever, malaise/fatigue and weight loss.  HENT: Negative for congestion, ear discharge and nosebleeds.   Eyes: Negative for blurred vision.  Respiratory: Negative for cough, hemoptysis, sputum production, shortness of breath and wheezing.   Cardiovascular: Negative  for chest pain, palpitations, orthopnea and claudication.  Gastrointestinal: Negative for abdominal pain, blood in stool, constipation, diarrhea, heartburn, melena, nausea and vomiting.  Genitourinary: Negative for dysuria, flank pain, frequency, hematuria and urgency.  Musculoskeletal: Negative for back pain, joint pain and myalgias.  Skin: Negative for rash.  Neurological: Negative for dizziness, tingling, focal weakness, seizures, weakness and headaches.  Endo/Heme/Allergies: Does not bruise/bleed easily.  Psychiatric/Behavioral: Negative for depression and suicidal ideas. The patient is nervous/anxious. The patient does not have insomnia.      Current treatment- oral iron  Allergies  Allergen Reactions  . Betadine [Povidone Iodine] Rash     Past Medical History:  Diagnosis Date  . Anemia    Low iron and hemoglobin  . Anginal pain ([place])    states she thinks it is stress  . Anxiety   . Asthma   . Depression   . Dysrhythmia    states she has PVC's  . Fibromyalgia   . Hypertension   . Schatzki's ring    dialated 3 times by Tiffany Kocher      Past Surgical History:  Procedure Laterality Date  . ABDOMINAL ADHESION SURGERY    . BUNIONECTOMY     left foot   . CERVICAL FUSION    . CESAREAN SECTION    . CHOLECYSTECTOMY  June 2013  . GASTRIC BYPASS      Social History   Social History  . Marital status: Married    Spouse name: N/A  . Number of children: N/A  . Years of education: N/A   Occupational History  . Not  on file.   Social History Main Topics  . Smoking status: Never Smoker  . Smokeless tobacco: Never Used  . Alcohol use No  . Drug use: No  . Sexual activity: Yes   Other Topics Concern  . Not on file   Social History Narrative   Lives with spouse, son, and stepson who has serious psychiatric issues and alcohol abuse    Family History  Problem Relation Age of Onset  . Diabetes Mother   . Hypertension Mother   . Heart disease Father   . Breast  cancer Maternal Aunt   . Cancer Maternal Aunt   . Colon cancer Neg Hx   . Ovarian cancer Neg Hx      Current Outpatient Prescriptions:  .  Biotin 1000 MCG tablet, Take 1,000 mcg by mouth 3 (three) times daily., Disp: , Rfl:  .  calcium carbonate (OS-CAL) 600 MG TABS, Take 600 mg by mouth 2 (two) times daily with a meal., Disp: , Rfl:  .  clonazePAM (KLONOPIN) 1 MG tablet, Take 1 tablet (1 mg total) by mouth 2 (two) times daily as needed., Disp: 60 tablet, Rfl: 0 .  dicyclomine (BENTYL) 20 MG tablet, Take 1 tablet (20 mg total) by mouth every 6 (six) hours., Disp: 360 tablet, Rfl: 1 .  diphenhydrAMINE (BENADRYL) 25 mg capsule, Take 25 mg by mouth every 6 (six) hours as needed., Disp: , Rfl:  .  escitalopram (LEXAPRO) 10 MG tablet, Take 10 mg by mouth daily., Disp: , Rfl:  .  Eszopiclone 3 MG TABS, Take 1 tablet (3 mg total) by mouth at bedtime. Take immediately before bedtime (Patient not taking: Reported on 06/24/2016), Disp: 30 tablet, Rfl: 1 .  ferrous sulfate 325 (65 FE) MG EC tablet, Take 325 mg by mouth daily., Disp: , Rfl:  .  folic acid (FOLVITE) 1 MG tablet, Take 1 mg by mouth daily. Reported on 09/04/2015, Disp: , Rfl:  .  gabapentin (NEURONTIN) 100 MG capsule, LIMIT 1-3 CAPSULES BY MOUTH PER DAY IF TOLERATED, Disp: 270 capsule, Rfl: 1 .  hyoscyamine (LEVSIN, ANASPAZ) 0.125 MG tablet, Take 1 tablet (0.125 mg total) by mouth 2 (two) times daily. (Patient not taking: Reported on 09/04/2015), Disp: 60 tablet, Rfl: 1 .  lamoTRIgine (LAMICTAL) 200 MG tablet, Take 1 tablet by mouth daily., Disp: , Rfl:  .  metoprolol succinate (TOPROL-XL) 50 MG 24 hr tablet, TAKE 1 TABLET BY MOUTH DAILY WITH OR IMMEDIATELY FOLLOWING A MEAL, Disp: 90 tablet, Rfl: 1 .  montelukast (SINGULAIR) 10 MG tablet, Take 10 mg by mouth at bedtime. Reported on 09/04/2015, Disp: , Rfl:  .  Multiple Vitamin (MULTIVITAMIN) tablet, Take 1 tablet by mouth daily. Bariatric Vitamin, Disp: , Rfl:  .  penciclovir (DENAVIR) 1 %  cream, Apply 1 application topically every 2 (two) hours. (Patient not taking: Reported on 09/04/2015), Disp: 1.5 g, Rfl: 0 .  promethazine (PHENERGAN) 12.5 MG tablet, Take 12.5 mg by mouth every 6 (six) hours as needed for nausea. Reported on 09/04/2015, Disp: , Rfl:  .  sertraline (ZOLOFT) 100 MG tablet, Take 100 mg by mouth daily. Reported on 09/04/2015, Disp: , Rfl:  .  traMADol (ULTRAM) 50 MG tablet, Take 1 tablet (50 mg total) by mouth every 6 (six) hours as needed. (Patient not taking: Reported on 07/22/2016), Disp: 60 tablet, Rfl: 4 .  vitamin B-12 (CYANOCOBALAMIN) 1000 MCG tablet, Take 3,000 mcg by mouth daily. Reported on 09/04/2015, Disp: , Rfl:   Physical exam:  Vitals:  07/22/16 1550 07/22/16 1554  BP: 125/83   Pulse: 65   Resp: 20   Temp:  98.1 F (36.7 C)  TempSrc: Tympanic Tympanic  Weight: 149 lb 3.2 oz (67.7 kg)    Physical Exam  Constitutional: She is oriented to person, place, and time and well-developed, well-nourished, and in no distress.  HENT:  Head: Normocephalic and atraumatic.  Eyes: EOM are normal. Pupils are equal, round, and reactive to light.  Neck: Normal range of motion.  Cardiovascular: Normal rate, regular rhythm and normal heart sounds.   Pulmonary/Chest: Effort normal and breath sounds normal.  Abdominal: Soft. Bowel sounds are normal.  Neurological: She is alert and oriented to person, place, and time.  Skin: Skin is warm and dry.     CMP Latest Ref Rng & Units 05/30/2016  Glucose 70 - 99 mg/dL 93  BUN 6 - 23 mg/dL 7  Creatinine 0.40 - 1.20 mg/dL 0.72  Sodium 135 - 145 mEq/L 140  Potassium 3.5 - 5.1 mEq/L 3.8  Chloride 96 - 112 mEq/L 107  CO2 19 - 32 mEq/L 26  Calcium 8.4 - 10.5 mg/dL 9.2  Total Protein 6.0 - 8.3 g/dL -  Total Bilirubin 0.2 - 1.2 mg/dL -  Alkaline Phos 39 - 117 U/L -  AST 0 - 37 U/L -  ALT 0 - 35 U/L -   CBC Latest Ref Rng & Units 07/22/2016  WBC 3.6 - 11.0 K/uL 7.7  Hemoglobin 12.0 - 16.0 g/dL 10.7(L)  Hematocrit 35.0 -  47.0 % 32.7(L)  Platelets 150 - 440 K/uL 238     Assessment and plan- Patient is a 61 y.o. female with iron deficiency anemia likely due to gastric bypass surgery  Patient has not had any improvement in her H/H despite trial of oral iron likely due to gastric bypass. Discussed risks and benefits of IV iron feraheme 510 mg  Weekly X 2 doses including all but not limited to headaches, fatigue and risk of infusion reactions. Patient understands and agrees to proceed as planned. I will see her back in 8 weeks with repeat cbc and iron studies     Visit Diagnosis 1. Other iron deficiency anemia   2. History of gastric bypass      Dr. Randa Evens, MD, MPH [facility] at [facility] Pager- [phone] 07/22/2016

## 2016-07-22 NOTE — Progress Notes (Signed)
Patient reports stressful home life with live in son and reports some chest pain and pressure mid sternum to the left side without radiation, denies nausea, sweating or sob with the chest pain. Reports that it is usually when she is angry.  Your eye doctor instructed her to see a cardiologist prior to her cataract surgery, patient reports that she is going to get an appt. Has seen Dr. Cassie FreerParachos in the past.

## 2016-07-25 ENCOUNTER — Ambulatory Visit: Admission: RE | Admit: 2016-07-25 | Payer: BLUE CROSS/BLUE SHIELD | Source: Ambulatory Visit | Admitting: Ophthalmology

## 2016-07-25 ENCOUNTER — Other Ambulatory Visit: Payer: Self-pay | Admitting: Oncology

## 2016-07-25 DIAGNOSIS — D649 Anemia, unspecified: Secondary | ICD-10-CM | POA: Insufficient documentation

## 2016-07-25 DIAGNOSIS — N189 Chronic kidney disease, unspecified: Principal | ICD-10-CM

## 2016-07-25 DIAGNOSIS — D631 Anemia in chronic kidney disease: Secondary | ICD-10-CM

## 2016-07-25 HISTORY — DX: Fibromyalgia: M79.7

## 2016-07-25 HISTORY — DX: Anemia, unspecified: D64.9

## 2016-07-25 HISTORY — DX: Cardiac arrhythmia, unspecified: I49.9

## 2016-07-25 HISTORY — DX: Angina pectoris, unspecified: I20.9

## 2016-07-25 SURGERY — PHACOEMULSIFICATION, CATARACT, WITH IOL INSERTION
Anesthesia: Topical | Laterality: Right

## 2016-07-26 ENCOUNTER — Encounter: Payer: Self-pay | Admitting: Oncology

## 2016-07-26 ENCOUNTER — Inpatient Hospital Stay: Payer: BLUE CROSS/BLUE SHIELD

## 2016-07-27 ENCOUNTER — Inpatient Hospital Stay: Payer: BLUE CROSS/BLUE SHIELD

## 2016-07-27 VITALS — BP 147/80 | HR 62 | Temp 97.6°F | Resp 20

## 2016-07-27 DIAGNOSIS — D509 Iron deficiency anemia, unspecified: Secondary | ICD-10-CM | POA: Diagnosis not present

## 2016-07-27 MED ORDER — SODIUM CHLORIDE 0.9 % IV SOLN
Freq: Once | INTRAVENOUS | Status: AC
  Administered 2016-07-27: 15:00:00 via INTRAVENOUS
  Filled 2016-07-27: qty 1000

## 2016-07-27 MED ORDER — SODIUM CHLORIDE 0.9 % IV SOLN
510.0000 mg | Freq: Once | INTRAVENOUS | Status: AC
  Administered 2016-07-27: 510 mg via INTRAVENOUS
  Filled 2016-07-27: qty 17

## 2016-08-02 ENCOUNTER — Inpatient Hospital Stay: Payer: BLUE CROSS/BLUE SHIELD

## 2016-08-09 ENCOUNTER — Inpatient Hospital Stay: Payer: BLUE CROSS/BLUE SHIELD

## 2016-08-12 ENCOUNTER — Inpatient Hospital Stay: Payer: BLUE CROSS/BLUE SHIELD | Attending: Oncology

## 2016-08-12 VITALS — BP 123/80 | HR 70 | Resp 20

## 2016-08-12 DIAGNOSIS — D509 Iron deficiency anemia, unspecified: Secondary | ICD-10-CM

## 2016-08-12 DIAGNOSIS — Z9884 Bariatric surgery status: Secondary | ICD-10-CM | POA: Diagnosis not present

## 2016-08-12 DIAGNOSIS — Z79899 Other long term (current) drug therapy: Secondary | ICD-10-CM | POA: Diagnosis not present

## 2016-08-12 MED ORDER — SODIUM CHLORIDE 0.9 % IV SOLN
510.0000 mg | Freq: Once | INTRAVENOUS | Status: AC
  Administered 2016-08-12: 510 mg via INTRAVENOUS
  Filled 2016-08-12: qty 17

## 2016-08-12 MED ORDER — SODIUM CHLORIDE 0.9 % IV SOLN
Freq: Once | INTRAVENOUS | Status: AC
  Administered 2016-08-12: 13:00:00 via INTRAVENOUS
  Filled 2016-08-12: qty 1000

## 2016-08-20 ENCOUNTER — Other Ambulatory Visit: Payer: Self-pay | Admitting: Internal Medicine

## 2016-08-22 NOTE — Telephone Encounter (Signed)
Gave at last OV in March, Please advise for refill. thanks

## 2016-08-22 NOTE — Telephone Encounter (Signed)
Refilled   Please set up office visit in September

## 2016-08-23 NOTE — Telephone Encounter (Signed)
Attempted to reach patient, not able to leave a VM, Mailbox not set up. thanks

## 2016-08-23 NOTE — Telephone Encounter (Signed)
Rx faxed to pharmacy  

## 2016-08-25 NOTE — Telephone Encounter (Signed)
Attempted to reach patient, not able to leave a message, need OV in September if she calls back, note closed.

## 2016-09-16 ENCOUNTER — Inpatient Hospital Stay: Payer: BLUE CROSS/BLUE SHIELD | Attending: Oncology | Admitting: Oncology

## 2016-09-16 ENCOUNTER — Inpatient Hospital Stay: Payer: BLUE CROSS/BLUE SHIELD

## 2016-09-16 VITALS — BP 145/87 | HR 73 | Temp 95.8°F | Resp 18 | Wt 150.2 lb

## 2016-09-16 DIAGNOSIS — I10 Essential (primary) hypertension: Secondary | ICD-10-CM | POA: Diagnosis not present

## 2016-09-16 DIAGNOSIS — M797 Fibromyalgia: Secondary | ICD-10-CM | POA: Diagnosis not present

## 2016-09-16 DIAGNOSIS — F419 Anxiety disorder, unspecified: Secondary | ICD-10-CM | POA: Insufficient documentation

## 2016-09-16 DIAGNOSIS — J45909 Unspecified asthma, uncomplicated: Secondary | ICD-10-CM | POA: Insufficient documentation

## 2016-09-16 DIAGNOSIS — Z9884 Bariatric surgery status: Secondary | ICD-10-CM | POA: Diagnosis not present

## 2016-09-16 DIAGNOSIS — Z79899 Other long term (current) drug therapy: Secondary | ICD-10-CM

## 2016-09-16 DIAGNOSIS — D508 Other iron deficiency anemias: Secondary | ICD-10-CM

## 2016-09-16 DIAGNOSIS — F329 Major depressive disorder, single episode, unspecified: Secondary | ICD-10-CM | POA: Insufficient documentation

## 2016-09-16 DIAGNOSIS — D509 Iron deficiency anemia, unspecified: Secondary | ICD-10-CM | POA: Diagnosis present

## 2016-09-16 LAB — CBC
HCT: 35.6 % (ref 35.0–47.0)
Hemoglobin: 12.1 g/dL (ref 12.0–16.0)
MCH: 29.1 pg (ref 26.0–34.0)
MCHC: 34.1 g/dL (ref 32.0–36.0)
MCV: 85.3 fL (ref 80.0–100.0)
Platelets: 197 10*3/uL (ref 150–440)
RBC: 4.17 MIL/uL (ref 3.80–5.20)
RDW: 15.9 % — ABNORMAL HIGH (ref 11.5–14.5)
WBC: 7 10*3/uL (ref 3.6–11.0)

## 2016-09-16 LAB — IRON AND TIBC
Iron: 49 ug/dL (ref 28–170)
Saturation Ratios: 18 % (ref 10.4–31.8)
TIBC: 266 ug/dL (ref 250–450)
UIBC: 217 ug/dL

## 2016-09-16 LAB — FERRITIN: Ferritin: 180 ng/mL (ref 11–307)

## 2016-09-16 NOTE — Progress Notes (Signed)
Patient states her appetite is not good.  Otherwise, offers no complaints.

## 2016-09-19 ENCOUNTER — Encounter: Payer: Self-pay | Admitting: Oncology

## 2016-09-19 ENCOUNTER — Telehealth: Payer: Self-pay | Admitting: Internal Medicine

## 2016-09-19 NOTE — Telephone Encounter (Signed)
Last OV 05/30/16 last fill was 05/30/16 for 60 with 3 refills ok to fill?

## 2016-09-19 NOTE — Progress Notes (Signed)
Hematology/Oncology Consult note [facility]  Telephone:([phone] [phone]  Patient Care Team: Crecencio Mc, MD as PCP - General (Internal Medicine)   Name of the patient: Sherri Lee  [id number]  03/04/1956   Date of visit: 09/19/16  Diagnosis- iron deficiency anemia likely due to gastric bypass  Chief complaint/ Reason for visit- routine f/u  Heme/Onc history: Patient is a 61 year old female with a history of Roux-en-Y gastric bypass in 2013. She is not seenbariatric surgery for a while now. Patient has been referred to [place] for microcytic anemia. Most recent CBC from 05/30/2016 showed white count of 5.3, H&H of 9.4/29.5 with an MCV of 79.9 and a platelet count of 227. B12 level was elevated at 1458 and folate was within normal limits. Ferritin was low at 6.9. Iron studies were within normal limits. TSH was within normal limits. Patient has not had recent EGD or colonoscopy. After her iron levels were noted to be low, patient was started on oral iron about a week ago which is tolerating well without any significant side effects. She denies any regular use of NSAIDS. Denies any blood in stool or urine  Blood work from 06/24/2016 was as follows: CBC showed white count of 6.1, H&H of 10.3/32.2 with an MCV of 78.9 and a platelet count of 217. Haptoglobin was normal at 112 and reticulocyte count was low normal at 1.2% serum copper was normal at 115 multiple myeloma panel did not reveal any monoclonal protein  Patient has received 2 doses of ferraheme so far  Interval history- reports doing well. denie any complaints  ECOG PS- 0 Pain scale- 0   Review of systems- Review of Systems  Constitutional: Negative for chills, fever, malaise/fatigue and weight loss.  HENT: Negative for congestion, ear discharge and nosebleeds.   Eyes: Negative for blurred vision.  Respiratory: Negative for cough, hemoptysis, sputum production, shortness of breath  and wheezing.   Cardiovascular: Negative for chest pain, palpitations, orthopnea and claudication.  Gastrointestinal: Negative for abdominal pain, blood in stool, constipation, diarrhea, heartburn, melena, nausea and vomiting.  Genitourinary: Negative for dysuria, flank pain, frequency, hematuria and urgency.  Musculoskeletal: Negative for back pain, joint pain and myalgias.  Skin: Negative for rash.  Neurological: Negative for dizziness, tingling, focal weakness, seizures, weakness and headaches.  Endo/Heme/Allergies: Does not bruise/bleed easily.  Psychiatric/Behavioral: Negative for depression and suicidal ideas. The patient does not have insomnia.        Allergies  Allergen Reactions  . Bacitracin-Polymyxin B Rash  . Betadine [Povidone Iodine] Rash     Past Medical History:  Diagnosis Date  . Anemia    Low iron and hemoglobin  . Anginal pain ([place])    states she thinks it is stress  . Anxiety   . Asthma   . Depression   . Dysrhythmia    states she has PVC's  . Fibromyalgia   . Hypertension   . Schatzki's ring    dialated 3 times by Tiffany Kocher      Past Surgical History:  Procedure Laterality Date  . ABDOMINAL ADHESION SURGERY    . BUNIONECTOMY     left foot   . CERVICAL FUSION    . CESAREAN SECTION    . CHOLECYSTECTOMY  June 2013  . GASTRIC BYPASS      Social History   Social History  . Marital status: Married    Spouse name: N/A  . Number of children: N/A  . Years of education: N/A  Occupational History  . Not on file.   Social History Main Topics  . Smoking status: Never Smoker  . Smokeless tobacco: Never Used  . Alcohol use No  . Drug use: No  . Sexual activity: Yes   Other Topics Concern  . Not on file   Social History Narrative   Lives with spouse, son, and stepson who has serious psychiatric issues and alcohol abuse    Family History  Problem Relation Age of Onset  . Diabetes Mother   . Hypertension Mother   . Heart disease Father     . Breast cancer Maternal Aunt   . Cancer Maternal Aunt   . Colon cancer Neg Hx   . Ovarian cancer Neg Hx      Current Outpatient Prescriptions:  .  Biotin 1000 MCG tablet, Take 1,000 mcg by mouth 3 (three) times daily., Disp: , Rfl:  .  calcium carbonate (OS-CAL) 600 MG TABS, Take 600 mg by mouth 2 (two) times daily with a meal., Disp: , Rfl:  .  clonazePAM (KLONOPIN) 1 MG tablet, Take 1 tablet (1 mg total) by mouth 2 (two) times daily as needed., Disp: 60 tablet, Rfl: 0 .  diphenhydrAMINE (BENADRYL) 25 mg capsule, Take 25 mg by mouth every 6 (six) hours as needed., Disp: , Rfl:  .  escitalopram (LEXAPRO) 10 MG tablet, Take 10 mg by mouth daily., Disp: , Rfl:  .  Eszopiclone 3 MG TABS, TAKE ONE TABLET BY MOUTH AT BEDTIME. TAKE IMMEDIATELY BEFORE BEDTIME, Disp: 30 tablet, Rfl: 1 .  ferrous sulfate 325 (65 FE) MG EC tablet, Take 325 mg by mouth daily., Disp: , Rfl:  .  gabapentin (NEURONTIN) 100 MG capsule, LIMIT 1-3 CAPSULES BY MOUTH PER DAY IF TOLERATED, Disp: 270 capsule, Rfl: 1 .  lamoTRIgine (LAMICTAL) 200 MG tablet, Take 1 tablet by mouth daily., Disp: , Rfl:  .  metoprolol succinate (TOPROL-XL) 50 MG 24 hr tablet, TAKE 1 TABLET BY MOUTH DAILY WITH OR IMMEDIATELY FOLLOWING A MEAL, Disp: 90 tablet, Rfl: 1 .  Multiple Vitamin (MULTIVITAMIN) tablet, Take 1 tablet by mouth daily. Bariatric Vitamin, Disp: , Rfl:  .  traMADol (ULTRAM) 50 MG tablet, Take 1 tablet (50 mg total) by mouth every 6 (six) hours as needed., Disp: 60 tablet, Rfl: 4 .  dicyclomine (BENTYL) 20 MG tablet, Take 1 tablet (20 mg total) by mouth every 6 (six) hours. (Patient not taking: Reported on 09/16/2016), Disp: 360 tablet, Rfl: 1 .  folic acid (FOLVITE) 1 MG tablet, Take 1 mg by mouth daily. Reported on 09/04/2015, Disp: , Rfl:  .  montelukast (SINGULAIR) 10 MG tablet, Take 10 mg by mouth at bedtime. Reported on 09/04/2015, Disp: , Rfl:  .  vitamin B-12 (CYANOCOBALAMIN) 1000 MCG tablet, Take 3,000 mcg by mouth daily.  Reported on 09/04/2015, Disp: , Rfl:   Physical exam:  Vitals:   09/16/16 1536  BP: (!) 145/87  Pulse: 73  Resp: 18  Temp: (!) 95.8 F (35.4 C)  TempSrc: Tympanic  Weight: 150 lb 4 oz (68.2 kg)   Physical Exam  Constitutional: She is oriented to person, place, and time and well-developed, well-nourished, and in no distress.  HENT:  Head: Normocephalic and atraumatic.  Eyes: EOM are normal. Pupils are equal, round, and reactive to light.  Neck: Normal range of motion.  Cardiovascular: Normal rate, regular rhythm and normal heart sounds.   Pulmonary/Chest: Effort normal and breath sounds normal.  Abdominal: Soft. Bowel sounds are normal.  Neurological:  She is alert and oriented to person, place, and time.  Skin: Skin is warm and dry.     CMP Latest Ref Rng & Units 05/30/2016  Glucose 70 - 99 mg/dL 93  BUN 6 - 23 mg/dL 7  Creatinine 0.40 - 1.20 mg/dL 0.72  Sodium 135 - 145 mEq/L 140  Potassium 3.5 - 5.1 mEq/L 3.8  Chloride 96 - 112 mEq/L 107  CO2 19 - 32 mEq/L 26  Calcium 8.4 - 10.5 mg/dL 9.2  Total Protein 6.0 - 8.3 g/dL -  Total Bilirubin 0.2 - 1.2 mg/dL -  Alkaline Phos 39 - 117 U/L -  AST 0 - 37 U/L -  ALT 0 - 35 U/L -   CBC Latest Ref Rng & Units 09/16/2016  WBC 3.6 - 11.0 K/uL 7.0  Hemoglobin 12.0 - 16.0 g/dL 12.1  Hematocrit 35.0 - 47.0 % 35.6  Platelets 150 - 440 K/uL 197     Assessment and plan- Patient is a 61 y.o. female with iron deficiency anemia likely secondary to gastric bypass  H/H significantly improved after 2 doses of IV iron. Iron studies WNL. She will get repeat labs in 3 months and I will see her back in 6 months with cbc, ferritin and iron studies. Although IDA likely due to gastric bypass, she is due for repeat colonoscopy at this time and I will refer her to Dr. Vira Agar who she has seen in the past    Visit Diagnosis 1. Iron deficiency anemia, unspecified iron deficiency anemia type      Dr. Randa Evens, MD, MPH [facility] at [facility] Pager- [phone] 09/19/2016 7:49 AM

## 2016-09-20 NOTE — Telephone Encounter (Signed)
REFILLED,  PEASE SCHEDULE HER 6 MONTH FOLLOW UP AT APPROPRIATE TIME   

## 2016-09-21 NOTE — Telephone Encounter (Signed)
LMTCB. Need to schedule pt a 6 month follow up in August.   Rx has been printed, signed and faxed.

## 2016-09-21 NOTE — Telephone Encounter (Signed)
Pt called backed was informed about rx. Pt is scheduled for 8/24 @ 4 pm.

## 2016-11-04 ENCOUNTER — Encounter (INDEPENDENT_AMBULATORY_CARE_PROVIDER_SITE_OTHER): Payer: Self-pay

## 2016-11-04 ENCOUNTER — Encounter: Payer: Self-pay | Admitting: Internal Medicine

## 2016-11-04 ENCOUNTER — Ambulatory Visit (INDEPENDENT_AMBULATORY_CARE_PROVIDER_SITE_OTHER): Payer: BLUE CROSS/BLUE SHIELD | Admitting: Internal Medicine

## 2016-11-04 DIAGNOSIS — F430 Acute stress reaction: Secondary | ICD-10-CM | POA: Diagnosis not present

## 2016-11-04 DIAGNOSIS — K222 Esophageal obstruction: Secondary | ICD-10-CM | POA: Diagnosis not present

## 2016-11-04 DIAGNOSIS — F411 Generalized anxiety disorder: Secondary | ICD-10-CM

## 2016-11-04 MED ORDER — ESZOPICLONE 3 MG PO TABS: ORAL_TABLET | ORAL | 5 refills | Status: DC

## 2016-11-04 MED ORDER — CLONAZEPAM 1 MG PO TABS: 1.0000 mg | ORAL_TABLET | Freq: Two times a day (BID) | ORAL | 5 refills | Status: DC | PRN

## 2016-11-04 MED ORDER — TRAMADOL HCL 50 MG PO TABS: 50.0000 mg | ORAL_TABLET | Freq: Four times a day (QID) | ORAL | 5 refills | Status: DC | PRN
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
## 2016-11-04 NOTE — Progress Notes (Signed)
Pre-visit discussion using our clinic review tool. No additional management support is needed unless otherwise documented below in the visit note.  

## 2016-11-04 NOTE — Patient Instructions (Addendum)
I have refilled your clonazepam and Lunesta for 6 months  I will not increase the dose of your medications because you are at the maximal dose   I strongly encourage you to attend Narcotics Anonymous for family members to get the support you need to stop enabling your son.

## 2016-11-06 NOTE — Assessment & Plan Note (Signed)
With dysmotility , small reducible hiatal hernia on April 2018 UGI study .  GI referral in process
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
## 2016-11-13 ENCOUNTER — Other Ambulatory Visit: Payer: Self-pay | Admitting: Internal Medicine

## 2016-11-26 ENCOUNTER — Other Ambulatory Visit: Payer: Self-pay | Admitting: Internal Medicine

## 2016-12-19 ENCOUNTER — Inpatient Hospital Stay: Payer: BLUE CROSS/BLUE SHIELD | Attending: Oncology

## 2017-03-20 DIAGNOSIS — I10 Essential (primary) hypertension: Secondary | ICD-10-CM | POA: Diagnosis not present

## 2017-03-20 DIAGNOSIS — R002 Palpitations: Secondary | ICD-10-CM | POA: Diagnosis not present

## 2017-03-20 DIAGNOSIS — R079 Chest pain, unspecified: Secondary | ICD-10-CM | POA: Diagnosis not present

## 2017-03-21 ENCOUNTER — Inpatient Hospital Stay: Payer: BLUE CROSS/BLUE SHIELD | Admitting: Oncology

## 2017-03-21 ENCOUNTER — Inpatient Hospital Stay: Payer: BLUE CROSS/BLUE SHIELD

## 2017-03-22 ENCOUNTER — Telehealth: Payer: Self-pay | Admitting: Oncology

## 2017-03-22 NOTE — Telephone Encounter (Signed)
2nd NoShow. Lab/MD rschd and conf with patient. (date per patient)  Appointment reminder mailed to patient.

## 2017-03-31 ENCOUNTER — Emergency Department
Admission: EM | Admit: 2017-03-31 | Discharge: 2017-03-31 | Disposition: A | Discharge status: Home / Self Care | Payer: Medicare HMO | Attending: Emergency Medicine | Admitting: Emergency Medicine

## 2017-03-31 ENCOUNTER — Other Ambulatory Visit: Payer: Self-pay

## 2017-03-31 ENCOUNTER — Other Ambulatory Visit: Payer: Self-pay | Admitting: Internal Medicine

## 2017-03-31 ENCOUNTER — Encounter: Payer: Self-pay | Admitting: *Deleted

## 2017-03-31 ENCOUNTER — Emergency Department: Payer: Medicare HMO

## 2017-03-31 DIAGNOSIS — T426X2A Poisoning by other antiepileptic and sedative-hypnotic drugs, intentional self-harm, initial encounter: Secondary | ICD-10-CM | POA: Diagnosis not present

## 2017-03-31 DIAGNOSIS — T50902A Poisoning by unspecified drugs, medicaments and biological substances, intentional self-harm, initial encounter: Secondary | ICD-10-CM | POA: Insufficient documentation

## 2017-03-31 DIAGNOSIS — I129 Hypertensive chronic kidney disease with stage 1 through stage 4 chronic kidney disease, or unspecified chronic kidney disease: Secondary | ICD-10-CM | POA: Insufficient documentation

## 2017-03-31 DIAGNOSIS — F4325 Adjustment disorder with mixed disturbance of emotions and conduct: Secondary | ICD-10-CM

## 2017-03-31 DIAGNOSIS — R69 Illness, unspecified: Secondary | ICD-10-CM | POA: Diagnosis not present

## 2017-03-31 DIAGNOSIS — R45851 Suicidal ideations: Secondary | ICD-10-CM | POA: Insufficient documentation

## 2017-03-31 DIAGNOSIS — F322 Major depressive disorder, single episode, severe without psychotic features: Secondary | ICD-10-CM | POA: Diagnosis not present

## 2017-03-31 DIAGNOSIS — Z79899 Other long term (current) drug therapy: Secondary | ICD-10-CM | POA: Diagnosis not present

## 2017-03-31 DIAGNOSIS — F4321 Adjustment disorder with depressed mood: Secondary | ICD-10-CM | POA: Diagnosis not present

## 2017-03-31 DIAGNOSIS — J45909 Unspecified asthma, uncomplicated: Secondary | ICD-10-CM | POA: Diagnosis not present

## 2017-03-31 DIAGNOSIS — T424X1A Poisoning by benzodiazepines, accidental (unintentional), initial encounter: Secondary | ICD-10-CM

## 2017-03-31 DIAGNOSIS — N189 Chronic kidney disease, unspecified: Secondary | ICD-10-CM | POA: Insufficient documentation

## 2017-03-31 DIAGNOSIS — Z87898 Personal history of other specified conditions: Secondary | ICD-10-CM

## 2017-03-31 LAB — COMPREHENSIVE METABOLIC PANEL
ALT: 36 U/L (ref 14–54)
AST: 33 U/L (ref 15–41)
Albumin: 3.6 g/dL (ref 3.5–5.0)
Alkaline Phosphatase: 72 U/L (ref 38–126)
Anion gap: 9 (ref 5–15)
BUN: 8 mg/dL (ref 6–20)
CO2: 24 mmol/L (ref 22–32)
Calcium: 8.9 mg/dL (ref 8.9–10.3)
Chloride: 108 mmol/L (ref 101–111)
Creatinine, Ser: 0.68 mg/dL (ref 0.44–1.00)
GFR calc Af Amer: >60 mL/min (ref >60)
GFR calc non Af Amer: >60 mL/min (ref >60)
Glucose, Bld: 99 mg/dL (ref 65–99)
Potassium: 3.3 mmol/L — ABNORMAL LOW (ref 3.5–5.1)
Sodium: 141 mmol/L (ref 135–145)
Total Bilirubin: 0.6 mg/dL (ref 0.3–1.2)
Total Protein: 6.4 g/dL — ABNORMAL LOW (ref 6.5–8.1)

## 2017-03-31 LAB — CBC
HCT: 38.5 % (ref 35.0–47.0)
Hemoglobin: 12.8 g/dL (ref 12.0–16.0)
MCH: 30.1 pg (ref 26.0–34.0)
MCHC: 33.4 g/dL (ref 32.0–36.0)
MCV: 90.1 fL (ref 80.0–100.0)
Platelets: 186 10*3/uL (ref 150–440)
RBC: 4.27 MIL/uL (ref 3.80–5.20)
RDW: 12.4 % (ref 11.5–14.5)
WBC: 5.6 10*3/uL (ref 3.6–11.0)

## 2017-03-31 LAB — TROPONIN I: Troponin I: <0.03 ng/mL (ref <0.03)

## 2017-03-31 LAB — ETHANOL: Alcohol, Ethyl (B): <10 mg/dL (ref <10)

## 2017-03-31 LAB — ACETAMINOPHEN LEVEL: Acetaminophen (Tylenol), Serum: <10 ug/mL — ABNORMAL LOW (ref 10–30)

## 2017-03-31 LAB — SALICYLATE LEVEL: Salicylate Lvl: <7 mg/dL (ref 2.8–30.0)

## 2017-03-31 MED ORDER — ACETAMINOPHEN 500 MG PO TABS
1000.0000 mg | ORAL_TABLET | Freq: Once | ORAL | Status: AC
  Administered 2017-03-31: 1000 mg via ORAL
  Filled 2017-03-31: qty 2

## 2017-03-31 NOTE — ED Notes (Signed)
IVC  RESCINDED  PER  DR  CLAPACS  INFORMED  WENDY  NEAL  BHU  NURSE  AND DR  Darnelle CatalanMALINDA  MD
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
## 2017-03-31 NOTE — ED Notes (Signed)
Dr. Clapacs is talking with Patient.  

## 2017-03-31 NOTE — ED Notes (Signed)
ED  Is the patient under IVC or is there intent for IVC: Yes.   Is the patient medically cleared: Yes.   Is there vacancy in the ED BHU: Yes.   Is the population mix appropriate for patient: Yes.   Is the patient awaiting placement in inpatient or outpatient setting:  Has the patient had a psychiatric consult:  Psych consult pending  Survey of unit performed for contraband, proper placement and condition of furniture, tampering with fixtures in bathroom, shower, and each patient room: Yes.  ; Findings:  APPEARANCE/BEHAVIOR Calm and cooperative NEURO ASSESSMENT Orientation: oriented x4  Denies pain Hallucinations: No.None noted (Hallucinations) Speech: Normal Gait: normal RESPIRATORY ASSESSMENT Even  Unlabored respirations  CARDIOVASCULAR ASSESSMENT Pulses equal   regular rate  Skin warm and dry   GASTROINTESTINAL ASSESSMENT no GI complaint EXTREMITIES Full ROM  PLAN OF CARE Provide calm/safe environment. Vital signs assessed twice daily. ED BHU Assessment once each 12-hour shift. Collaborate with TTS daily or as condition indicates. Assure the ED provider has rounded once each shift. Provide and encourage hygiene. Provide redirection as needed. Assess for escalating behavior; address immediately and inform ED provider.  Assess family dynamic and appropriateness for visitation as needed: Yes.  ; If necessary, describe findings:  Educate the patient/family about BHU procedures/visitation: Yes.  ; If necessary, describe findings:

## 2017-03-31 NOTE — ED Notes (Signed)
Patient is talking with visitor in dayroom, visit is supervised. Patient is calm and cooperative.

## 2017-03-31 NOTE — ED Notes (Signed)
Spoke with poison control regarding pt current status. Pt eyes closed and even respirations but easily aroused.

## 2017-03-31 NOTE — ED Provider Notes (Signed)
Dr. Mat Carnelay packs a seen the patient. He feels she is okay to go home and follow-up with her regular psychiatrist Dr. Raynald KempHsu.   Arnaldo NatalMalinda, Kamoni Gentles F, MD 03/31/17 [phone]

## 2017-03-31 NOTE — ED Notes (Signed)
Pt changed in to paper scrubs and belongings placed in personal belongings bags and taken to BHU. Two bags present containing medications (no narcotics), purse (pt states no valuables inside), coat, clothing. Pt calm and cooperative.

## 2017-03-31 NOTE — ED Provider Notes (Signed)
-----------------------------------------   9:19 AM on 03/31/2017 -----------------------------------------  I discussed the patient with poison control they recommended 4-6-hour observation.  Patient has been observed over 6 hours.  She appears extremely well has eaten, sitting up in bed, asking if she can make a phone call to let people know where she is.  We will discontinue medical observation at this time currently awaiting psychiatric disposition.   Minna AntisPaduchowski, Kylian Loh, MD 03/31/17 [phone]

## 2017-03-31 NOTE — ED Notes (Signed)
IVC 
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
## 2017-03-31 NOTE — ED Notes (Signed)
[place] poison control called. Pt is to be monitored for 4-6 hours.
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
## 2017-03-31 NOTE — ED Notes (Signed)
Patient voices understanding of discharge instructions, all belongings given back to Patient, Patient denies Si/hi or avh, Patient called cab to come pick her up at front entrance of ED.

## 2017-03-31 NOTE — ED Notes (Signed)
Patient with mom in dayroom talking calmly.

## 2017-03-31 NOTE — Discharge Instructions (Signed)
please continue to take all your medicines. Please follow-up with your regular psychiatrist. Sherri Lee's return here for any further problems.

## 2017-03-31 NOTE — ED Notes (Signed)
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
## 2017-03-31 NOTE — ED Notes (Signed)
Patient assigned to appropriate care area  She is IVC with psych consult pending  Patient oriented to care area: Informed her that for her safety, care areas are designed for safety; Visiting hours and phone times explained to patient. Patient verbalizes understanding, and verbal contract for safety obtained.

## 2017-03-31 NOTE — Consult Note (Signed)
[facility] Consult   Reason for Consult: Consult for 62 year old woman who came to the emergency room after taking an excessive number of Klonopin Referring Physician: Rip Harbour Patient Identification: Sherri Lee MRN:  [id number] Principal Diagnosis: Adjustment disorder with mixed disturbance of emotions and conduct Diagnosis:   Patient Active Problem List   Diagnosis Date Noted  . Adjustment disorder with mixed disturbance of emotions and conduct [F43.25] 03/31/2017    Priority: High  . Benzodiazepine overdose [T42.4X1A] 03/31/2017    Priority: High  . Anemia in chronic kidney disease [N18.9, D63.1] 07/25/2016  . Esophageal stricture [K22.2] 06/19/2016  . Anxiety as acute reaction to exceptional stress [F41.1, F43.0] 05/31/2016  . IBS (irritable bowel syndrome) [K58.9] 09/06/2015  . IDA (iron deficiency anemia) [D50.9] 09/06/2015  . Musculoskeletal neck pain [M54.2] 09/04/2015  . DDD (degenerative disc disease), cervical [M50.30] 09/24/2014  . Intercostal neuralgia [G58.8] 09/24/2014  . DDD (degenerative disc disease), thoracic [M51.34] 09/24/2014  . Tremor of both hands [R25.1] 09/10/2014  . Insomnia due to anxiety and fear [F51.05, F40.9] 02/26/2013  . Ovarian cyst, left [N83.202] 08/09/2012  . Alopecia areata [L63.9] 07/11/2012  . Cervicalgia [M54.2] 07/11/2012  . Thoracic spine pain [M54.6] 10/04/2011  . Essential hypertension [I10] 05/10/2011  . Prediabetes [R73.03] 05/10/2011  . Status post gastric bypass for obesity [Z98.84] 05/10/2011    Total Time spent with patient: 1 hour  Subjective:   Sherri Lee is a 62 y.o. female patient admitted with "it has been a lot of stress".  HPI: Patient interviewed chart reviewed.  62 year old woman came into the emergency room last night on her own after having taken an excessive number of clonazepam.  Patient describes how her younger son, who has had chronic substance abuse and behavior  problems, was at her house fighting with her yelling at her abusing her feel her medicine.  Patient has been under a great deal of stress related to her son's behavior.  She felt like she was at her wits end and remembers starting to take the pills.  After taking them she immediately realized that she did not want to hurt her self and called an ambulance herself.  Mood is chronically anxious and stressed out.  Does not sleep very well.  All of this she relates to the enormous stress of having 2 sons with substance abuse and legal problems.  Patient is seeing an outpatient psychiatrist and therapist and is overall compliant with her medicine.  She denies that she regularly abuses alcohol or any drugs.  Currently she is lucid.  Denies any hallucinations or psychotic symptoms.  Denies any current suicidal thoughts.  Social history: Patient lives with her younger son who has substance abuse and legal problems.  She does have close relationships with her own family of origin including her siblings and her mother.  She intends to go stay with her mother immediately upon leaving the emergency room.  Patient is retired does not work outside the home.  She is divorced.  Medical history: High blood pressure.  Otherwise fairly stable without chronic medical problems other than mental health.  Substance abuse history: Says that she drinks occasionally does not think it has ever been abusive.  No history of substance abuse treatment.  Past Psychiatric History: No history of inpatient treatment.  No prior suicide attempts.  No history of violence or psychosis.  She goes to [facility] and sees a psychiatrist and therapist.  Currently on Lexapro and clonazepam.  Risk to  Self: Suicidal Ideation: No Suicidal Intent: No Is patient at risk for suicide?: Yes Suicidal Plan?: No Access to Means: Yes Specify Access to Suicidal Means: medication What has been your use of drugs/alcohol within the last 12 months?:  Pt endorses occasionally drinking wine How many times?: 0 Other Self Harm Risks: None reported  Triggers for Past Attempts: Other (Comment)(Pt denies ) Intentional Self Injurious Behavior: None Risk to Others: Homicidal Ideation: No Thoughts of Harm to Others: No Current Homicidal Intent: No Current Homicidal Plan: No Access to Homicidal Means: No Identified Victim: N/A History of harm to others?: No Assessment of Violence: None Noted Violent Behavior Description: None reported  Does patient have access to weapons?: No Criminal Charges Pending?: No Does patient have a court date: No Prior Inpatient Therapy: Prior Inpatient Therapy: No Prior Therapy Dates: N/A Prior Therapy Facilty/Provider(s): N/A Reason for Treatment: N/A Prior Outpatient Therapy: Prior Outpatient Therapy: Yes Prior Therapy Dates: 2018-current Prior Therapy Facilty/Provider(s): [place], [place]  Reason for Treatment: Depression Does patient have an ACCT team?: No Does patient have Intensive In-House Services?  : No Does patient have [facility] services? : No Does patient have P4CC services?: No  Past Medical History:  Past Medical History:  Diagnosis Date  . Anemia    Low iron and hemoglobin  . Anginal pain ([place])    states she thinks it is stress  . Anxiety   . Asthma   . Depression   . Dysrhythmia    states she has PVC's  . Fibromyalgia   . Hypertension   . Schatzki's ring    dialated 3 times by Tiffany Kocher     Past Surgical History:  Procedure Laterality Date  . ABDOMINAL ADHESION SURGERY    . BUNIONECTOMY     left foot   . CERVICAL FUSION    . CESAREAN SECTION    . CHOLECYSTECTOMY  June 2013  . GASTRIC BYPASS     Family History:  Family History  Problem Relation Age of Onset  . Diabetes Mother   . Hypertension Mother   . Heart disease Father   . Breast cancer Maternal Aunt   . Cancer Maternal Aunt   . Colon cancer Neg Hx   . Ovarian cancer Neg Hx    Family  Psychiatric  History: Sons with substance abuse no other family history Social History:  Social History   Substance and Sexual Activity  Alcohol Use No     Social History   Substance and Sexual Activity  Drug Use No    Social History   Socioeconomic History  . Marital status: Married    Spouse name: None  . Number of children: None  . Years of education: None  . Highest education level: None  Social Needs  . Financial resource strain: None  . Food insecurity - worry: None  . Food insecurity - inability: None  . Transportation needs - medical: None  . Transportation needs - non-medical: None  Occupational History  . None  Tobacco Use  . Smoking status: Never Smoker  . Smokeless tobacco: Never Used  Substance and Sexual Activity  . Alcohol use: No  . Drug use: No  . Sexual activity: Yes  Other Topics Concern  . None  Social History Narrative   Lives with spouse, son, and stepson who has serious psychiatric issues and alcohol abuse   Additional Social History:    Allergies:   Allergies  Allergen Reactions  . Bacitracin-Polymyxin B Rash  .  Betadine [Povidone Iodine] Rash    Labs:  Results for orders placed or performed during the hospital encounter of 03/31/17 (from the past 48 hour(s))  Comprehensive metabolic panel     Status: Abnormal   Collection Time: 03/31/17  3:12 AM  Result Value Ref Range   Sodium 141 135 - 145 mmol/L   Potassium 3.3 (L) 3.5 - 5.1 mmol/L   Chloride 108 101 - 111 mmol/L   CO2 24 22 - 32 mmol/L   Glucose, Bld 99 65 - 99 mg/dL   BUN 8 6 - 20 mg/dL   Creatinine, Ser 0.68 0.44 - 1.00 mg/dL   Calcium 8.9 8.9 - 10.3 mg/dL   Total Protein 6.4 (L) 6.5 - 8.1 g/dL   Albumin 3.6 3.5 - 5.0 g/dL   AST 33 15 - 41 U/L   ALT 36 14 - 54 U/L   Alkaline Phosphatase 72 38 - 126 U/L   Total Bilirubin 0.6 0.3 - 1.2 mg/dL   GFR calc non Af Amer >60 >60 mL/min   GFR calc Af Amer >60 >60 mL/min    Comment: (NOTE) The eGFR has been calculated using  the CKD EPI equation. This calculation has not been validated in all clinical situations. eGFR's persistently <60 mL/min signify possible Chronic Kidney Disease.    Anion gap 9 5 - 15    Comment: Performed at [facility], [address] [place], [place] [address]  Ethanol     Status: None   Collection Time: 03/31/17  3:12 AM  Result Value Ref Range   Alcohol, Ethyl (B) <10 <10 mg/dL    Comment:        LOWEST DETECTABLE LIMIT FOR SERUM ALCOHOL IS 10 mg/dL FOR MEDICAL PURPOSES ONLY Performed at [facility], [address] [place], [place] [address]   Salicylate level     Status: None   Collection Time: 03/31/17  3:12 AM  Result Value Ref Range   Salicylate Lvl <9.4 2.8 - 30.0 mg/dL    Comment: Performed at [facility], [address] [place], [place] [address]  Acetaminophen level     Status: Abnormal   Collection Time: 03/31/17  3:12 AM  Result Value Ref Range   Acetaminophen (Tylenol), Serum <10 (L) 10 - 30 ug/mL    Comment:        THERAPEUTIC CONCENTRATIONS VARY SIGNIFICANTLY. A RANGE OF 10-30 ug/mL MAY BE AN EFFECTIVE CONCENTRATION FOR MANY PATIENTS. HOWEVER, SOME ARE BEST TREATED AT CONCENTRATIONS OUTSIDE THIS RANGE. ACETAMINOPHEN CONCENTRATIONS >150 ug/mL AT 4 HOURS AFTER INGESTION AND >50 ug/mL AT 12 HOURS AFTER INGESTION ARE OFTEN ASSOCIATED WITH TOXIC REACTIONS. Performed at [facility], [address] [place], [place] [address]   cbc     Status: None   Collection Time: 03/31/17  3:12 AM  Result Value Ref Range   WBC 5.6 3.6 - 11.0 K/uL   RBC 4.27 3.80 - 5.20 MIL/uL   Hemoglobin 12.8 12.0 - 16.0 g/dL   HCT 38.5 35.0 - 47.0 %   MCV 90.1 80.0 - 100.0 fL   MCH 30.1 26.0 - 34.0 pg   MCHC 33.4 32.0 - 36.0 g/dL   RDW 12.4 11.5 - 14.5 %   Platelets 186 150 - 440 K/uL    Comment: Performed at [facility], [address] [place],  [address]  Troponin I     Status: None   Collection Time: 03/31/17   3:12 AM  Result Value Ref Range  Troponin I <0.03 <0.03 ng/mL    Comment: Performed at [facility], [address] [place], [place] [address]    No current facility-administered medications for this encounter.    Current Outpatient Medications  Medication Sig Dispense Refill  . Biotin 1000 MCG tablet Take 1,000 mcg by mouth 3 (three) times daily.    . calcium carbonate (OS-CAL) 600 MG TABS Take 600 mg by mouth 2 (two) times daily with a meal.    . clonazePAM (KLONOPIN) 1 MG tablet Take 1 tablet (1 mg total) by mouth 2 (two) times daily as needed. 60 tablet 5  . escitalopram (LEXAPRO) 20 MG tablet Take 20 mg by mouth daily.     . Eszopiclone 3 MG TABS TAKE ONE TABLET BY MOUTH AT BEDTIME. TAKE IMMEDIATELY BEFORE BEDTIME 30 tablet 5  . ferrous sulfate 325 (65 FE) MG EC tablet Take 325 mg by mouth daily.    Marland Kitchen gabapentin (NEURONTIN) 100 MG capsule LIMIT 1-3 CAPSULES BY MOUTH PER DAY IF TOLERATED 270 capsule 1  . metoprolol succinate (TOPROL-XL) 50 MG 24 hr tablet TAKE 1 TABLET BY MOUTH DAILY WITH OR IMMEDIATELY FOLLOWING A MEAL 90 tablet 1  . Multiple Vitamin (MULTIVITAMIN) tablet Take 1 tablet by mouth daily. Bariatric Vitamin    . traMADol (ULTRAM) 50 MG tablet Take 1 tablet (50 mg total) by mouth every 6 (six) hours as needed. 90 tablet 5  . dicyclomine (BENTYL) 20 MG tablet Take 1 tablet (20 mg total) by mouth every 6 (six) hours. (Patient not taking: Reported on 11/04/2016) 360 tablet 1    Musculoskeletal: Strength & Muscle Tone: within normal limits Gait & Station: normal Patient leans: N/A  Psychiatric Specialty Exam: Physical Exam  Nursing note and vitals reviewed. Constitutional: She appears well-developed and well-nourished.  HENT:  Head: Normocephalic and atraumatic.  Eyes: Conjunctivae are normal. Pupils are equal, round, and reactive to light.  Neck: Normal range of motion.  Cardiovascular: Regular rhythm and normal heart sounds.  Respiratory: Effort normal.  No respiratory distress.  GI: Soft.  Musculoskeletal: Normal range of motion.  Neurological: She is alert.  Skin: Skin is warm and dry.  Psychiatric: Her speech is normal and behavior is normal. Judgment and thought content normal. Her mood appears anxious. Cognition and memory are normal.    Review of Systems  Constitutional: Negative.   HENT: Negative.   Eyes: Negative.   Respiratory: Negative.   Cardiovascular: Negative.   Gastrointestinal: Negative.   Musculoskeletal: Negative.   Skin: Negative.   Neurological: Negative.   Psychiatric/Behavioral: Negative for depression, hallucinations, memory loss, substance abuse and suicidal ideas. The patient is nervous/anxious and has insomnia.     Blood pressure 111/72, pulse 75, temperature 98 F (36.7 C), temperature source Oral, resp. rate 14, height 5' 5"  (1.651 m), weight 71.7 kg (158 lb), SpO2 100 %.Body mass index is 26.29 kg/m.  General Appearance: Casual  Eye Contact:  Fair  Speech:  Clear and Coherent  Volume:  Normal  Mood:  Dysphoric  Affect:  Congruent  Thought Process:  Goal Directed  Orientation:  Full (Time, Place, and Person)  Thought Content:  Logical  Suicidal Thoughts:  No  Homicidal Thoughts:  No  Memory:  Immediate;   Fair Recent;   Fair Remote;   Fair  Judgement:  Fair  Insight:  Fair  Psychomotor Activity:  Normal  Concentration:  Concentration: Fair  Recall:  [employer] of Knowledge:  Fair  Language:  Fair  Akathisia:  No  Handed:  Right  AIMS (if indicated):     Assets:  Desire for Improvement Housing Physical Health Resilience  ADL's:  Intact  Cognition:  WNL  Sleep:        Treatment Plan Summary: Plan 62 year old woman who came in on her own initiative after taking an overdose of clonazepam.  Does not appear that she was seriously trying to kill herself.  Has consistently denied suicidal intent or wish since then.  Patient is articulate about the stress she is under and is receiving  appropriate outpatient treatment.  Not psychotic.  Has an appropriate plan to take care of herself after leaving the hospital.  Patient does not want to stay in the hospital and I do not think she meets commitment criteria any further.  Discontinue IVC.  Counseling completed with the patient encouraging her to get back in touch with her therapist.  Encouraged her not to drink alcohol, not to overuse medication, try and minimize stressful situations.  I agree going to stay with her mother sounds like a good idea.  Case reviewed with TTS and emergency room physician.  Discontinue IVC.  Disposition: No evidence of imminent risk to self or others at present.   Patient does not meet criteria for psychiatric inpatient admission. Supportive therapy provided about ongoing stressors. Discussed crisis plan, support from social network, calling 911, coming to the Emergency Department, and calling Suicide Hotline.  Alethia Berthold, MD 03/31/2017 4:02 PM

## 2017-04-04 DIAGNOSIS — R109 Unspecified abdominal pain: Secondary | ICD-10-CM | POA: Diagnosis not present

## 2017-04-04 DIAGNOSIS — Z8371 Family history of colonic polyps: Secondary | ICD-10-CM | POA: Diagnosis not present

## 2017-04-04 DIAGNOSIS — R1310 Dysphagia, unspecified: Secondary | ICD-10-CM | POA: Diagnosis not present

## 2017-04-04 DIAGNOSIS — D509 Iron deficiency anemia, unspecified: Secondary | ICD-10-CM | POA: Diagnosis not present

## 2017-04-04 DIAGNOSIS — R197 Diarrhea, unspecified: Secondary | ICD-10-CM | POA: Diagnosis not present

## 2017-04-07 ENCOUNTER — Inpatient Hospital Stay: Payer: BLUE CROSS/BLUE SHIELD | Admitting: Oncology

## 2017-04-07 ENCOUNTER — Inpatient Hospital Stay: Payer: BLUE CROSS/BLUE SHIELD

## 2017-04-25 ENCOUNTER — Inpatient Hospital Stay: Payer: BLUE CROSS/BLUE SHIELD | Attending: Oncology | Admitting: Oncology

## 2017-04-25 ENCOUNTER — Telehealth: Payer: Self-pay | Admitting: Internal Medicine

## 2017-04-25 ENCOUNTER — Inpatient Hospital Stay: Payer: BLUE CROSS/BLUE SHIELD

## 2017-04-25 NOTE — Telephone Encounter (Signed)
Please advise 

## 2017-04-25 NOTE — Telephone Encounter (Signed)
Copied from CRM [id number]. Topic: Quick Communication - See Telephone Encounter >> Apr 25, 2017  2:18 PM Windy KalataMichael, Meoshia Billing L, NT wrote: CRM for notification. See Telephone encounter for:  04/25/17.  [facility] on [address] [facility] [place] is calling and needs clarification on refilling Clonazepam. States the patient is requesting a refill on Clonazepam 1mg  that was last refilled a month ago and that Clonazepam 0.5mg  was called in 2 weeks ago by a different doctor on 04/10/17. Please contact the pharmacy at [phone]

## 2017-04-27 NOTE — Telephone Encounter (Signed)
Pharmacy should not fill mine ! She needs to be notified that I will no longer fill since she is getting them from another doctor

## 2017-04-27 NOTE — Telephone Encounter (Signed)
Please advise 

## 2017-05-02 ENCOUNTER — Encounter: Payer: Self-pay | Admitting: *Deleted

## 2017-05-02 ENCOUNTER — Telehealth: Payer: Self-pay | Admitting: *Deleted

## 2017-05-02 NOTE — Telephone Encounter (Signed)
Spoke with pharmacist and informed him that Dr. Darrick Huntsmanullo is okay with filling the 1mg  clonazepam but not to fill the lunesta if the pt has an [place]. The pharmacist stated that he would cancel the lunesta and all refills since she does have an active [place] prescription.

## 2017-05-02 NOTE — Telephone Encounter (Signed)
Spoke with the Pharmacist and he stated that Dr. Fannie KneeSue, which is the other doctor that prescribed the .5mg  clonazepam, has called and canceled the medication along with all refills. Pharmacy is wanting to know if it is okay to continue filling the the 1mg  that you prescribed? Pharmacist also stated that there is another situation about he same going on with her sleeping medication. The pharmacist stated that the patient is filling both Lunesta 3mg  prescribed by you and Ambien 10mg  prescribed by Dr. Fannie KneeSue and they are wanting to know if you aware of this and if you want to keep prescribing the lunesta.

## 2017-05-02 NOTE — Telephone Encounter (Signed)
Ok to fill the clonazepam that I wrote,  But if seh has an active AMBIEN,  Do not fill the [place].

## 2017-05-02 NOTE — Telephone Encounter (Signed)
No show x 3, sent certified letter for pt to close services with our  practice

## 2017-05-06 DIAGNOSIS — R69 Illness, unspecified: Secondary | ICD-10-CM | POA: Diagnosis not present

## 2017-05-07 ENCOUNTER — Emergency Department
Admission: EM | Admit: 2017-05-07 | Discharge: 2017-05-07 | Disposition: A | Discharge status: Home / Self Care | Payer: Medicare HMO | Attending: Emergency Medicine | Admitting: Emergency Medicine

## 2017-05-07 ENCOUNTER — Emergency Department: Payer: Medicare HMO

## 2017-05-07 ENCOUNTER — Other Ambulatory Visit: Payer: Self-pay

## 2017-05-07 ENCOUNTER — Encounter: Payer: Self-pay | Admitting: Emergency Medicine

## 2017-05-07 DIAGNOSIS — F410 Panic disorder [episodic paroxysmal anxiety] without agoraphobia: Secondary | ICD-10-CM | POA: Insufficient documentation

## 2017-05-07 DIAGNOSIS — R064 Hyperventilation: Secondary | ICD-10-CM | POA: Diagnosis not present

## 2017-05-07 DIAGNOSIS — E876 Hypokalemia: Secondary | ICD-10-CM | POA: Insufficient documentation

## 2017-05-07 DIAGNOSIS — Z79899 Other long term (current) drug therapy: Secondary | ICD-10-CM | POA: Insufficient documentation

## 2017-05-07 DIAGNOSIS — F430 Acute stress reaction: Secondary | ICD-10-CM | POA: Insufficient documentation

## 2017-05-07 DIAGNOSIS — R079 Chest pain, unspecified: Secondary | ICD-10-CM | POA: Insufficient documentation

## 2017-05-07 DIAGNOSIS — I10 Essential (primary) hypertension: Secondary | ICD-10-CM | POA: Insufficient documentation

## 2017-05-07 DIAGNOSIS — R69 Illness, unspecified: Secondary | ICD-10-CM | POA: Diagnosis not present

## 2017-05-07 LAB — URINALYSIS, COMPLETE (UACMP) WITH MICROSCOPIC
Bacteria, UA: NONE SEEN
Bilirubin Urine: NEGATIVE
Glucose, UA: NEGATIVE mg/dL
Hgb urine dipstick: NEGATIVE
Ketones, ur: 20 mg/dL — AB
Leukocytes, UA: NEGATIVE
Nitrite: NEGATIVE
Protein, ur: NEGATIVE mg/dL
Specific Gravity, Urine: 1.006 (ref 1.005–1.030)
pH: 8 (ref 5.0–8.0)

## 2017-05-07 LAB — URINE DRUG SCREEN, QUALITATIVE (ARMC ONLY)
Amphetamines, Ur Screen: NOT DETECTED
Barbiturates, Ur Screen: NOT DETECTED
Benzodiazepine, Ur Scrn: POSITIVE — AB
Cannabinoid 50 Ng, Ur ~~LOC~~: NOT DETECTED
Cocaine Metabolite,Ur ~~LOC~~: NOT DETECTED
MDMA (Ecstasy)Ur Screen: NOT DETECTED
Methadone Scn, Ur: NOT DETECTED
Opiate, Ur Screen: NOT DETECTED
Phencyclidine (PCP) Ur S: NOT DETECTED
Tricyclic, Ur Screen: NOT DETECTED

## 2017-05-07 LAB — CBC WITH DIFFERENTIAL/PLATELET
Basophils Absolute: 0.1 10*3/uL (ref 0–0.1)
Basophils Relative: 1 %
Eosinophils Absolute: 0.2 10*3/uL (ref 0–0.7)
Eosinophils Relative: 3 %
HCT: 41.3 % (ref 35.0–47.0)
Hemoglobin: 13.6 g/dL (ref 12.0–16.0)
Lymphocytes Relative: 42 %
Lymphs Abs: 3.1 10*3/uL (ref 1.0–3.6)
MCH: 29.5 pg (ref 26.0–34.0)
MCHC: 32.9 g/dL (ref 32.0–36.0)
MCV: 89.8 fL (ref 80.0–100.0)
Monocytes Absolute: 0.6 10*3/uL (ref 0.2–0.9)
Monocytes Relative: 8 %
Neutro Abs: 3.4 10*3/uL (ref 1.4–6.5)
Neutrophils Relative %: 46 %
Platelets: 225 10*3/uL (ref 150–440)
RBC: 4.6 MIL/uL (ref 3.80–5.20)
RDW: 12.3 % (ref 11.5–14.5)
WBC: 7.4 10*3/uL (ref 3.6–11.0)

## 2017-05-07 LAB — BASIC METABOLIC PANEL
Anion gap: 13 (ref 5–15)
BUN: 8 mg/dL (ref 6–20)
CO2: 19 mmol/L — ABNORMAL LOW (ref 22–32)
Calcium: 9.4 mg/dL (ref 8.9–10.3)
Chloride: 111 mmol/L (ref 101–111)
Creatinine, Ser: 0.77 mg/dL (ref 0.44–1.00)
GFR calc Af Amer: >60 mL/min (ref >60)
GFR calc non Af Amer: >60 mL/min (ref >60)
Glucose, Bld: 99 mg/dL (ref 65–99)
Potassium: 2.7 mmol/L — CL (ref 3.5–5.1)
Sodium: 143 mmol/L (ref 135–145)

## 2017-05-07 LAB — TROPONIN I: Troponin I: <0.03 ng/mL (ref <0.03)

## 2017-05-07 LAB — ETHANOL: Alcohol, Ethyl (B): <10 mg/dL (ref <10)

## 2017-05-07 LAB — ACETAMINOPHEN LEVEL: Acetaminophen (Tylenol), Serum: <10 ug/mL — ABNORMAL LOW (ref 10–30)

## 2017-05-07 LAB — SALICYLATE LEVEL: Salicylate Lvl: <7 mg/dL (ref 2.8–30.0)

## 2017-05-07 MED ORDER — LORAZEPAM 1 MG PO TABS
1.0000 mg | ORAL_TABLET | Freq: Once | ORAL | Status: AC
  Administered 2017-05-07: 1 mg via ORAL
  Filled 2017-05-07: qty 1

## 2017-05-07 MED ORDER — POTASSIUM CHLORIDE CRYS ER 20 MEQ PO TBCR
60.0000 meq | EXTENDED_RELEASE_TABLET | Freq: Once | ORAL | Status: AC
  Administered 2017-05-07: 60 meq via ORAL
  Filled 2017-05-07: qty 3

## 2017-05-07 MED ORDER — LOPERAMIDE HCL 2 MG PO CAPS
4.0000 mg | ORAL_CAPSULE | Freq: Once | ORAL | Status: DC
  Filled 2017-05-07: qty 2

## 2017-05-07 NOTE — ED Triage Notes (Signed)
Patient presents to Emergency Department via EMS from home with complaints of rapid breathing, EMS called by pt's mother d/t pt panicking. Pt reports boyfriend buried last Tues and tonight her son was arrested for a drug problem.    Pt reports not taking metoprolol tonight.

## 2017-05-07 NOTE — Discharge Instructions (Addendum)
You are evaluated for stress and anxiety given significant stressors right now.  You are recommended to follow-up with a psychiatrist and/or psychologist to discuss possible group therapy for stressors.  Return to the emergency department immediately for any worsening condition including any thoughts of wanting to hurt yourself or others, chest pain, dizziness or passing out, depression, or any other symptoms concerning to you.

## 2017-05-07 NOTE — ED Notes (Signed)

## 2017-05-07 NOTE — BH Assessment (Signed)
Assessment Note  Sherri Lee is an 62 y.o. female who presented at [facility] with her mother and her sister.  Patient has a hisory of depression and anxiety which is complicated by multiple stressors at home.  Her son has a heroin problem and is stealing from her and is demanding and she states that her boyfriend of four years died last week while she was en route to take him to the hospital.  Patient states that she is feeling extremely stressed and states that she has been having panic attacks.  Patient states that tonight she had one of the worst panic attacks that she has ever experienced and it left her incapacitated for awhile.  Patient states that she was recently on BMU in January because of similar circumstances. She denies current SI/HI/Psychosis. Patient does state that on one occasion that she took too many Clonazepam, but she is not sure if she did it because she was suicidal or not.  Patient states that she divorced her husband about five years ago and around that same time, her youngest son developed a drug problem and is now using heroin and cocaine. She states that his use has progressed so much that he is now using needles and states that she recently found him overdosed in his car.  Patient states that he steals from her and even steals her medications.  She states that she has to keep everything locked up of hidden from him.  She states that he is very demanding and is persistent with her in trying to get what he wants to the point that she has meltdowns because of his behavior. She states that she and her husband have taken out an IVC on him in the past and contemplated having him arrested for all the things that she has stolen.  Patient states that she has tried to avoid legal charges because she did not want to mess up her son's future.  However, she states that tonight he was arrested for embezzlement from an employer and she states that she felt like it was the best thing  that could happen in order to break his addictive cycle.  However, she states that it was not even a couple hours before the Sheriff's Department called and said he had been released and she feels like that is what triggered her panic attack.  Patient states that she is depressed.  She states that she is not able to sleep more than four to five hours a night and that is when she takes Ambien.  She states that she has no appetite and states that she feels anxious and stressed most all the time.  Patient was alert, oriented and pleasant, but very focused on her social situation.  She would go off on tangents at times when trying to answer questions posed by TTS.  Patient's memory appeared to be intact and she was coherent.  Patient states that she has decreased concentration and states that she feels helpless at times. Patient states that she drinks 3 glasses of wine on occasion, but denies any problematic use of alcohol.  She denies any illicit drug use.   Diagnosis: Generalized Anxiety Disorder F41.1 and Major Depressive Disorder F33.2  Past Medical History:  Past Medical History:  Diagnosis Date  . Anemia    Low iron and hemoglobin  . Anginal pain (HCC)    states she thinks it is stress  . Anxiety   . Asthma   . Depression   .  Dysrhythmia    states she has PVC's  . Fibromyalgia   . Hypertension   . Schatzki's ring    dialated 3 times by Markham Jordan     Past Surgical History:  Procedure Laterality Date  . ABDOMINAL ADHESION SURGERY    . BUNIONECTOMY     left foot   . CERVICAL FUSION    . CESAREAN SECTION    . CHOLECYSTECTOMY  June 2013  . GASTRIC BYPASS      Family History:  Family History  Problem Relation Age of Onset  . Diabetes Mother   . Hypertension Mother   . Heart disease Father   . Breast cancer Maternal Aunt   . Cancer Maternal Aunt   . Colon cancer Neg Hx   . Ovarian cancer Neg Hx     Social History:  reports that  has never smoked. she has never used smokeless  tobacco. She reports that she drinks about 1.8 oz of alcohol per week. She reports that she does not use drugs.  Additional Social History:  Alcohol / Drug Use Pain Medications: takes neurontin for pain Prescriptions: takes clonazepam for her anxiety Over the Counter: denies History of alcohol / drug use?: Yes Longest period of sobriety (when/how long): states that she only drinks on occasion, less than one time weekly and her use has never been problematic Substance #1 Name of Substance 1: alcohol 1 - Age of First Use: 17 1 - Amount (size/oz): 3 glases wine 1 - Frequency: 1-2 x month 1 - Duration: since age 24 1 - Last Use / Amount: 2 weeks ago  CIWA: CIWA-Ar BP: (!) 147/94 Pulse Rate: 79 COWS:    Allergies:  Allergies  Allergen Reactions  . Bacitracin-Polymyxin B Rash  . Betadine [Povidone Iodine] Rash    Home Medications:  (Not in a hospital admission)  OB/GYN Status:  No LMP recorded. Patient is postmenopausal.  General Assessment Data Location of Assessment: [facility] TTS Assessment: In system Is this a Tele or Face-to-Face Assessment?: Face-to-Face Is this an Initial Assessment or a Re-assessment for this encounter?: Initial Assessment Marital status: Divorced Hall name: Ericka Pontiff Is patient pregnant?: No Pregnancy Status: No Living Arrangements: Children Can pt return to current living arrangement?: Yes Admission Status: Voluntary Is patient capable of signing voluntary admission?: Yes Referral Source: Self/Family/Friend Insurance type: [employer]     Crisis Care Plan Living Arrangements: Children Legal Guardian: Other:(self) Name of Psychiatrist: (Dr Janeece Riggers, [facility]) Name of Therapist: (none)  Education Status Current Grade: NA 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 : No Suicidal Intent: No Has patient had any suicidal intent within the past 6 months prior to admission? : No Is patient at risk for suicide?: No Suicidal Plan?: No Has patient had any suicidal plan within the past 6 months prior to admission? : No Access to Means: No What has been your use of drugs/alcohol within the last 12 months?: (alcohol on occasion) Previous Attempts/Gestures: No How many times?: 0 Other Self Harm Risks: (None reported) Triggers for Past Attempts: None known Intentional Self Injurious Behavior: None Family Suicide History: No Recent stressful life event(s): Conflict (Comment), Divorce, Financial Problems, Other (Comment)(grief) Persecutory voices/beliefs?: No Depression: Yes Depression Symptoms: Insomnia, Tearfulness, Loss of interest in usual pleasures Substance abuse history  and/or treatment for substance abuse?: No Suicide prevention information given to non-admitted patients: Not applicable  Risk to Others within the past 6 months Homicidal Ideation: No Does patient have any lifetime risk of violence toward others beyond the six months prior to admission? : No Thoughts of Harm to Others: No Current Homicidal Intent: No Current Homicidal Plan: No Access to Homicidal Means: No Identified Victim: (none) History of harm to others?: No Assessment of Violence: On admission Violent Behavior Description: (none) Does patient have access to weapons?: No Criminal Charges Pending?: No Does patient have a court date: No Is patient on probation?: No  Psychosis Hallucinations: None noted Delusions: None noted  Mental Status Report Appearance/Hygiene: Disheveled Eye Contact: Good Motor Activity: Freedom of movement, Unremarkable Speech: Logical/coherent Level of Consciousness: Alert Mood: Depressed, Anxious Affect: Anxious, Depressed Anxiety Level: Panic Attacks Panic attack frequency: (daily) Most recent panic attack:  (tonight) Thought Processes: Coherent, Relevant Judgement: Impaired Orientation: Person, Place, Time, Situation Obsessive Compulsive Thoughts/Behaviors: Minimal  Cognitive Functioning Concentration: Decreased Memory: Recent Intact, Remote Intact IQ: Above Average Insight: Poor Impulse Control: Poor Appetite: Poor Weight Loss: 6 Sleep: Decreased Total Hours of Sleep: 4 Vegetative Symptoms: None  ADLScreening [facility]) Patient's cognitive ability adequate to safely complete daily activities?: Yes Patient able to express need for assistance with ADLs?: Yes Independently performs ADLs?: Yes (appropriate for developmental age)  Prior Inpatient Therapy Prior Inpatient Therapy: Yes Prior Therapy Dates: (2018 BMU and 5 [place] ago in [place]) Prior Therapy Facilty/Provider(s[facility] Reason for Treatment: (depression and anxiety)  Prior Outpatient Therapy Prior Outpatient Therapy: Yes Prior Therapy Dates: (current) [facility] Reason for Treatment: (depression and anxiety) Does patient have an ACCT team?: No Does patient have Intensive In-House Services?  : No Does patient have [facility] services? : No Does patient have P4CC services?: No  ADL Screening (condition at time of admission) Patient's cognitive ability adequate to safely complete daily activities?: Yes Is the patient deaf or have difficulty hearing?: No Does the patient have difficulty seeing, even when wearing glasses/contacts?: No Does the patient have difficulty concentrating, remembering, or making decisions?: No Patient able to express need for assistance with ADLs?: Yes Does the patient have difficulty dressing or bathing?: No Independently performs ADLs?: Yes (appropriate for developmental age) Does the patient have difficulty walking or climbing stairs?: No Weakness of Legs: None Weakness of Arms/Hands: Both        Abuse/Neglect Assessment (Assessment to be complete while patient is alone) Abuse/Neglect Assessment Can Be Completed: Yes Physical Abuse: Denies Verbal Abuse: Denies Sexual Abuse: Denies Exploitation of patient/patient's resources: Denies Self-Neglect: Denies Values / Beliefs Cultural Requests During Hospitalization: None Spiritual Requests During Hospitalization: None Consults Spiritual Care Consult Needed: No Social Work Consult Needed: No Merchant navy officerAdvance Directives (For Healthcare) Does Patient Have a Medical Advance Directive?: No Would patient like information on creating a medical advance directive?: No - Patient declined    Additional Information 1:1 In Past 12 Months?: No CIRT Risk: No Elopement Risk: No Does patient have medical clearance?: No     Disposition: Pending SOC Consult Disposition Initial Assessment Completed for this Encounter: Yes Disposition of Patient: Pending Review with psychiatrist  On Site Evaluation by:   Reviewed with Physician:    Arnoldo Lenisanny J Evana Runnels 05/07/2017 4:03 AM

## 2017-05-07 NOTE — ED Provider Notes (Signed)
Reevaluation, patient is not having active suicidal ideation.  I discussed discharge instructions with her.  She has a ride that will come pick her up.  Discharge instructions:  You are evaluated for stress and anxiety given significant stressors right now.  You are recommended to follow-up with a psychiatrist and/or psychologist to discuss possible group therapy for stressors.  Return to the emergency department immediately for any worsening condition including any thoughts of wanting to hurt yourself or others, chest pain, dizziness or passing out, depression, or any other symptoms concerning to you.    Governor RooksLord, Jalyssa Fleisher, MD 05/07/17 [phone]

## 2017-05-07 NOTE — ED Notes (Signed)
TTS with pt att 

## 2017-05-07 NOTE — ED Provider Notes (Signed)
I spoke with the psychiatrist by phone.  He does not recommend IVC.  He is recommending outpatient group counseling.  He is recommending one-time dose of Ativan 1 mg around 830 and watch for 1 hour.  He is recommending against any prescription for benzo out of concern for abuse.  I will refer to [facility] for intake eval for psychiatric/psychology follow up.    Governor RooksLord, Norabelle Kondo, MD 05/07/17 [phone]

## 2017-05-07 NOTE — ED Provider Notes (Signed)
[facility] Emergency Department Provider Note   ____________________________________________   First MD Initiated Contact with Patient 05/07/17 0105     (approximate)  I have reviewed the triage vital signs and the nursing notes.   HISTORY  Chief Complaint Hyperventilating and Panic Attack    HPI Sherri Lee is a 62 y.o. female brought to the ED via EMS from home with a chief complaint of panic attack.  Patient has a history of anxiety and depression, out of her Klonopin for the past week.  Over the past 2 weeks, she has had exceptional stress: Found her boyfriend dead and buried him on 08/17/2022.  Her son was arrested tonight; he has had a history of drug use and stealing from her.  She was arguing with him tonight and had an onset of panic attack with chest pain and hyperventilation.  Denies recent fever, chills, abdominal pain, nausea, vomiting, diarrhea.  Denies recent travel or trauma.   Past Medical History:  Diagnosis Date  . Anemia    Low iron and hemoglobin  . Anginal pain (HCC)    states she thinks it is stress  . Anxiety   . Asthma   . Depression   . Dysrhythmia    states she has PVC's  . Fibromyalgia   . Hypertension   . Schatzki's ring    dialated 3 times by [facility]     Patient Active Problem List   Diagnosis Date Noted  . Adjustment disorder with mixed disturbance of emotions and conduct 03/31/2017  . Benzodiazepine overdose 03/31/2017  . Anemia in chronic kidney disease 08/16/2016  . Esophageal stricture 06/19/2016  . Anxiety as acute reaction to exceptional stress 05/31/2016  . IBS (irritable bowel syndrome) 09/06/2015  . IDA (iron deficiency anemia) 09/06/2015  . Musculoskeletal neck pain 09/04/2015  . DDD (degenerative disc disease), cervical 09/24/2014  . Intercostal neuralgia 09/24/2014  . DDD (degenerative disc disease), thoracic 09/24/2014  . Tremor of both hands 09/10/2014  . Insomnia due to anxiety and  fear 02/26/2013  . Ovarian cyst, left 08/09/2012  . Alopecia areata 07/11/2012  . Cervicalgia 07/11/2012  . Thoracic spine pain 10/04/2011  . Essential hypertension 05/10/2011  . Prediabetes 05/10/2011  . Status post gastric bypass for obesity 05/10/2011    Past Surgical History:  Procedure Laterality Date  . ABDOMINAL ADHESION SURGERY    . BUNIONECTOMY     left foot   . CERVICAL FUSION    . CESAREAN SECTION    . CHOLECYSTECTOMY  June 2013  . GASTRIC BYPASS      Prior to Admission medications   Medication Sig Start Date End Date Taking? Authorizing Provider  Biotin 1000 MCG tablet Take 1,000 mcg by mouth 3 (three) times daily.   Yes [provider]  calcium carbonate (OS-CAL) 600 MG TABS Take 600 mg by mouth 2 (two) times daily with a meal.   Yes [provider]  clonazePAM (KLONOPIN) 0.5 MG tablet Take 0.5 mg by mouth 2 (two) times daily.   Yes [provider]  escitalopram (LEXAPRO) 20 MG tablet Take 20 mg by mouth daily.    Yes [provider]  Eszopiclone 3 MG TABS TAKE ONE TABLET BY MOUTH AT BEDTIME. TAKE IMMEDIATELY BEFORE BEDTIME 11/04/16  Yes [name], MD  ferrous sulfate 325 (65 FE) MG EC tablet Take 325 mg by mouth daily.   Yes [provider]  gabapentin (NEURONTIN) 100 MG capsule LIMIT 1-3 CAPSULES BY MOUTH PER  DAY IF TOLERATED 04/03/17  Yes Sherlene Shamsullo, Teresa L, MD  lamoTRIgine (LAMICTAL) 200 MG tablet Take 200 mg by mouth daily.   Yes [provider]  metoprolol succinate (TOPROL-XL) 50 MG 24 hr tablet TAKE 1 TABLET BY MOUTH DAILY WITH OR IMMEDIATELY FOLLOWING A MEAL 11/28/16  Yes Sherlene Shamsullo, Teresa L, MD  Multiple Vitamin (MULTIVITAMIN) tablet Take 1 tablet by mouth daily. Bariatric Vitamin   Yes [provider]  traMADol (ULTRAM) 50 MG tablet Take 1 tablet (50 mg total) by mouth every 6 (six) hours as needed. 11/04/16  Yes Sherlene Shamsullo, Teresa L, MD  zolpidem (AMBIEN) 10 MG tablet Take 10 mg by mouth at bedtime as  needed for sleep.   Yes [provider]  dicyclomine (BENTYL) 20 MG tablet Take 1 tablet (20 mg total) by mouth every 6 (six) hours. Patient not taking: Reported on 11/04/2016 10/05/15   Sherlene Shamsullo, Teresa L, MD    Allergies Bacitracin-polymyxin b and Betadine [povidone iodine]  Family History  Problem Relation Age of Onset  . Diabetes Mother   . Hypertension Mother   . Heart disease Father   . Breast cancer Maternal Aunt   . Cancer Maternal Aunt   . Colon cancer Neg Hx   . Ovarian cancer Neg Hx     Social History Social History   Tobacco Use  . Smoking status: Never Smoker  . Smokeless tobacco: Never Used  Substance Use Topics  . Alcohol use: Yes    Alcohol/week: 1.8 oz    Types: 3 Glasses of wine per week    Comment: once weekly  . Drug use: No    Review of Systems  Constitutional: No fever/chills. Eyes: No visual changes. ENT: No sore throat. Cardiovascular: Denies chest pain. Respiratory: Denies shortness of breath. Gastrointestinal: No abdominal pain.  No nausea, no vomiting.  No diarrhea.  No constipation. Genitourinary: Negative for dysuria. Musculoskeletal: Negative for back pain. Skin: Negative for rash. Neurological: Negative for headaches, focal weakness or numbness. Psychiatric:Positive for anxiety/depression.  Negative for active SI/HI/AH/VH.  ____________________________________________   PHYSICAL EXAM:  VITAL SIGNS: ED Triage Vitals  Enc Vitals Group     BP 05/07/17 0051 (!) 134/112     Pulse Rate 05/07/17 0051 84     Resp 05/07/17 0051 (!) 30     Temp 05/07/17 0051 97.7 F (36.5 C)     Temp Source 05/07/17 0051 Axillary     SpO2 05/07/17 0051 100 %     Weight 05/07/17 0052 153 lb (69.4 kg)     Height 05/07/17 0052 5\' 5"  (1.651 m)     Head Circumference --      Peak Flow --      Pain Score 05/07/17 0112 8     Pain Loc --      Pain Edu? --      Excl. in GC? --     Constitutional: Alert and oriented.  Actively hyperventilating  and panicking.  Eyes: Conjunctivae are normal. PERRL. EOMI. Head: Atraumatic. Nose: No congestion/rhinnorhea. Mouth/Throat: Mucous membranes are moist.  Oropharynx non-erythematous. Neck: No stridor.   Cardiovascular: Normal rate, regular rhythm. Grossly normal heart sounds.  Good peripheral circulation. Respiratory: Normal respiratory effort.  No retractions. Lungs CTAB. Gastrointestinal: Soft and nontender. No distention. No abdominal bruits. No CVA tenderness. Musculoskeletal: No lower extremity tenderness nor edema.  No joint effusions. Neurologic:  Normal speech and language. No gross focal neurologic deficits are appreciated. No gait instability. Skin:  Skin is warm, dry and intact.  No rash noted. Psychiatric: Mood and affect are anxious. Speech and behavior are normal.  ____________________________________________   LABS (all labs ordered are listed, but only abnormal results are displayed)  Labs Reviewed  BASIC METABOLIC PANEL - Abnormal; Notable for the following components:      Result Value   Potassium 2.7 (*)    CO2 19 (*)    All other components within normal limits  ACETAMINOPHEN LEVEL - Abnormal; Notable for the following components:   Acetaminophen (Tylenol), Serum <10 (*)    All other components within normal limits  CBC WITH DIFFERENTIAL/PLATELET  ETHANOL  SALICYLATE LEVEL  TROPONIN I  URINALYSIS, COMPLETE (UACMP) WITH MICROSCOPIC  URINE DRUG SCREEN, QUALITATIVE ([facility] ONLY)   ____________________________________________  EKG  ED ECG REPORT I, Marcelles Clinard J, the attending physician, personally viewed and interpreted this ECG.   Date: 05/07/2017  EKG Time: 0157  Rate: 72  Rhythm: normal EKG, normal sinus rhythm  Axis: Normal   Intervals:none  ST&T Change: Nonspecific  ____________________________________________  RADIOLOGY  ED MD interpretation: No acute cardiopulmonary process  Official radiology report(s): Dg Chest Port 1 View  Result Date:  05/07/2017 CLINICAL DATA:  62 year old female with chest pain. EXAM: PORTABLE CHEST 1 VIEW COMPARISON:  Chest radiograph dated 10/14/2011 FINDINGS: The lungs are clear. There is no pleural effusion or pneumothorax. The cardiac silhouette is within normal limits. Partially visualized cervical fixation hardware. No acute osseous pathology. IMPRESSION: No active disease. Electronically Signed   By: Elgie Collard M.D.   On: 05/07/2017 01:53    ____________________________________________   PROCEDURES  Procedure(s) performed: None  Procedures  Critical Care performed: No  ____________________________________________   INITIAL IMPRESSION / ASSESSMENT AND PLAN / ED COURSE  As part of my medical decision making, I reviewed the following data within the electronic MEDICAL RECORD NUMBER History obtained from family, Nursing notes reviewed and incorporated, Labs reviewed, EKG interpreted, Old chart reviewed, Radiograph reviewed, A consult was requested and obtained from this/these consultant(s) Psychiatry and Notes from prior ED visits.   62 year old female with a history of anxiety and depression who presents with panic attack.  Family at bedside and patient has calmed down somewhat.  Endorses vague SI several weeks ago, but no current plan.  Sounds like patient has undergone exceptional stress over the past couple weeks.  Will obtain screening lab work including EKG and troponin, administer anxiolytic and consult [facility] psychiatry.  Patient may remain under voluntary status.  Clinical Course as of May 08 607  Wynelle Link May 07, 2017  1610 Noted hypokalemia.  Will administer oral replacement.  [JS]  0609 Patient resting no acute distress.  Remains in the ED voluntarily pending [facility] psychiatry consultation.  [JS]    Clinical Course User Index [JS] Irean Hong, MD     ____________________________________________   FINAL CLINICAL IMPRESSION(S) / ED DIAGNOSES  Final diagnoses:  Panic attack due to  exceptional stress  Hypokalemia     ED Discharge Orders    None       Note:  This document was prepared using Dragon voice recognition software and may include unintentional dictation errors.    Irean Hong, MD 05/07/17 [phone]

## 2017-05-07 NOTE — ED Notes (Signed)
CRITICAL LAB: POTASSIUM is 2.7, Sherri Lee, Lab, Dr. Dolores FrameSUNG notified, orders received

## 2017-05-07 NOTE — ED Notes (Signed)
[facility], SOC machine to room

## 2017-05-07 NOTE — ED Notes (Signed)
SOC recommends 1mg  Ativan @ 0830 then D/C to home at about 10am.  [facility]:  Dr. Loretha BrasilHulkower [phone]

## 2017-05-07 NOTE — ED Notes (Addendum)
Pt states that when she was fighting with her son a couple of months back she felt like "getting out of here" (SI) due to the pressures with son (stealing for heroin addiction and abusive to family), pt denies plan  Pt reports taking clonazepam daily for the last approx 3 years and ran out this week being unable to taper off  Pt reports chest, and hands and feet tingling and hurting while hyperventilating

## 2017-05-18 DIAGNOSIS — R69 Illness, unspecified: Secondary | ICD-10-CM | POA: Diagnosis not present

## 2017-05-24 ENCOUNTER — Other Ambulatory Visit: Payer: Self-pay | Admitting: Internal Medicine

## 2017-05-26 ENCOUNTER — Other Ambulatory Visit: Payer: Self-pay | Admitting: *Deleted

## 2017-05-26 NOTE — Progress Notes (Signed)
Certified mail receipt signed by patient was received from [employer].     dhs

## 2017-06-06 ENCOUNTER — Other Ambulatory Visit: Payer: Self-pay | Admitting: Internal Medicine

## 2017-06-06 NOTE — Telephone Encounter (Signed)
Copied from CRM [id number]. Topic: Quick Communication - Rx Refill/Question >> Jun 06, 2017  1:31 PM Raquel SarnaHayes, Teresa G wrote: traMADol (ULTRAM) 50 MG tablet  Pt has an appt on 06-26-17.  She will be out soon.  Can she get enough to last her until her visit. Pt is out.  Need filled ASAP  [id number], [place] - [address] [place] [id number] Phone: [phone] Fax: [phone]

## 2017-06-06 NOTE — Telephone Encounter (Signed)
tramadol refill Last OV: 11/04/16 has upcoming OV 06/26/17 Last Refill:11/04/16 #90 tab [facility]acy[facility] [address] PCP: Dr Darrick Huntsmanullo

## 2017-06-07 NOTE — Telephone Encounter (Signed)
Refilled: 11/04/2016 Last OV: 11/04/2016 Next OV: 06/26/2017 

## 2017-06-19 ENCOUNTER — Other Ambulatory Visit: Payer: Self-pay | Admitting: Internal Medicine

## 2017-06-19 NOTE — Telephone Encounter (Signed)
Copied from CRM [id number]. Topic: Quick Communication - See Telephone Encounter >> Jun 19, 2017  1:34 PM Waymon AmatoBurton, Donna F wrote: CRM for notification. See Telephone encounter for: 06/19/17. Pt is needing a refill on her gabpentin and tramadal  [facility] [address]  Best number [phone] or cell [phone]

## 2017-06-19 NOTE — Telephone Encounter (Signed)
Pt requesting refill of Tramadol and Gabapentin.    LOV: 11/04/16  Next OV: 06/26/17  Dr. Darrick Huntsmanullo  [facility]  [address]

## 2017-06-19 NOTE — Telephone Encounter (Signed)
Please advise 

## 2017-06-20 NOTE — Telephone Encounter (Signed)
Refilled: 11/04/2016 Last OV: 11/04/2016 Next OV: 06/26/2017

## 2017-06-20 NOTE — Telephone Encounter (Signed)
Refill for 30 days only [facility] .  OFFICE VISIT NEEDED prior to any more refills

## 2017-06-20 NOTE — Telephone Encounter (Signed)
LMTCB. Please transfer pt to our office.  

## 2017-06-26 ENCOUNTER — Encounter: Payer: Self-pay | Admitting: Internal Medicine

## 2017-06-26 ENCOUNTER — Ambulatory Visit (INDEPENDENT_AMBULATORY_CARE_PROVIDER_SITE_OTHER): Payer: Medicare HMO | Admitting: Internal Medicine

## 2017-06-26 VITALS — BP 118/80 | HR 67 | Temp 98.2°F | Resp 15 | Ht 65.0 in | Wt 156.0 lb

## 2017-06-26 DIAGNOSIS — E611 Iron deficiency: Secondary | ICD-10-CM | POA: Diagnosis not present

## 2017-06-26 DIAGNOSIS — F430 Acute stress reaction: Secondary | ICD-10-CM

## 2017-06-26 DIAGNOSIS — R197 Diarrhea, unspecified: Secondary | ICD-10-CM

## 2017-06-26 DIAGNOSIS — M542 Cervicalgia: Secondary | ICD-10-CM | POA: Diagnosis not present

## 2017-06-26 DIAGNOSIS — M5412 Radiculopathy, cervical region: Secondary | ICD-10-CM | POA: Diagnosis not present

## 2017-06-26 DIAGNOSIS — E538 Deficiency of other specified B group vitamins: Secondary | ICD-10-CM | POA: Diagnosis not present

## 2017-06-26 DIAGNOSIS — F411 Generalized anxiety disorder: Secondary | ICD-10-CM

## 2017-06-26 DIAGNOSIS — R5383 Other fatigue: Secondary | ICD-10-CM

## 2017-06-26 DIAGNOSIS — R112 Nausea with vomiting, unspecified: Secondary | ICD-10-CM | POA: Diagnosis not present

## 2017-06-26 DIAGNOSIS — R69 Illness, unspecified: Secondary | ICD-10-CM | POA: Diagnosis not present

## 2017-06-26 MED ORDER — PANTOPRAZOLE SODIUM 40 MG PO TBEC: 40.0000 mg | DELAYED_RELEASE_TABLET | Freq: Every day | ORAL | 3 refills | Status: DC
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
## 2017-06-26 NOTE — Patient Instructions (Signed)
Referrals to Dr Alva [facility] and [facility] are in progress  I recommend starting Protonix for your stomach aSAP   Iron studies today

## 2017-06-27 DIAGNOSIS — R112 Nausea with vomiting, unspecified: Secondary | ICD-10-CM | POA: Insufficient documentation

## 2017-06-27 DIAGNOSIS — R197 Diarrhea, unspecified: Principal | ICD-10-CM

## 2017-06-27 LAB — IRON,TIBC AND FERRITIN PANEL
%SAT: 24 % (calc) (ref 11–50)
Ferritin: 68 ng/mL (ref 20–288)
Iron: 65 ug/dL (ref 45–160)
TIBC: 275 mcg/dL (calc) (ref 250–450)

## 2017-06-27 LAB — TSH: TSH: 0.59 u[IU]/mL (ref 0.35–4.50)

## 2017-06-27 LAB — VITAMIN B12: Vitamin B-12: 622 pg/mL (ref 211–911)

## 2017-06-27 NOTE — Assessment & Plan Note (Signed)
Continue prn use of lorazepam . The risks and benefits of benzodiazepine use were reviewed with patient today including excessive sedation leading to respiratory depression,  impaired thinking/driving, and addiction.  Patient was advised to avoid concurrent use with alcohol, to use medication only as needed and not to share with others  .

## 2017-06-27 NOTE — Assessment & Plan Note (Signed)
She has been having daily headaches With bilateral radiculopathy which may be from spinal stenosis affecting C8.  Referral to Neurology for EMG/nerve conduction studies.

## 2017-06-27 NOTE — Assessment & Plan Note (Signed)
Chronic , Occurring post prandially,  Has been referred to GI almost a year ago, but has not had appt yet.  Recommend seeing her bariatric surgeon first, as she may have a problem/ulceration  with the anastomosis .  Has not been taking PPI .  PPI prescribed.

## 2017-06-29 NOTE — Telephone Encounter (Signed)
Medications denied per Dr. Darrick Huntsmanullo

## 2017-07-21 ENCOUNTER — Other Ambulatory Visit: Payer: Self-pay | Admitting: Internal Medicine

## 2017-07-24 NOTE — Telephone Encounter (Signed)
Pt called to f/u on tramadol RX. She has been out since Saturday. Added cell #. Please advise.

## 2017-07-24 NOTE — Telephone Encounter (Signed)
Please advise 

## 2017-07-24 NOTE — Telephone Encounter (Signed)
Okay to refill Tramadol? Last refilled on 06/20/17 #90  LOV: 06/26/17 NOV: 12/26/17

## 2017-07-25 NOTE — Telephone Encounter (Signed)
Refilled: 06/20/2017 Last OV: 06/26/2017 Next OV: 12/26/2017

## 2017-07-25 NOTE — Telephone Encounter (Signed)
Refilled today,  Please remind patient of the refill policy and turnaround time.

## 2017-07-26 NOTE — Telephone Encounter (Signed)
Pt.notified

## 2017-08-01 DIAGNOSIS — K635 Polyp of colon: Secondary | ICD-10-CM | POA: Diagnosis not present

## 2017-08-01 DIAGNOSIS — K222 Esophageal obstruction: Secondary | ICD-10-CM | POA: Diagnosis not present

## 2017-08-01 DIAGNOSIS — K620 Anal polyp: Secondary | ICD-10-CM | POA: Diagnosis not present

## 2017-08-01 DIAGNOSIS — K648 Other hemorrhoids: Secondary | ICD-10-CM | POA: Diagnosis not present

## 2017-08-01 DIAGNOSIS — D509 Iron deficiency anemia, unspecified: Secondary | ICD-10-CM | POA: Diagnosis not present

## 2017-08-01 DIAGNOSIS — K621 Rectal polyp: Secondary | ICD-10-CM | POA: Diagnosis not present

## 2017-08-01 DIAGNOSIS — R1310 Dysphagia, unspecified: Secondary | ICD-10-CM | POA: Diagnosis not present

## 2017-08-01 DIAGNOSIS — K640 First degree hemorrhoids: Secondary | ICD-10-CM | POA: Diagnosis not present

## 2017-08-01 DIAGNOSIS — Z9884 Bariatric surgery status: Secondary | ICD-10-CM | POA: Diagnosis not present

## 2017-08-01 DIAGNOSIS — Z8371 Family history of colonic polyps: Secondary | ICD-10-CM | POA: Diagnosis not present

## 2017-08-01 DIAGNOSIS — Z538 Procedure and treatment not carried out for other reasons: Secondary | ICD-10-CM | POA: Diagnosis not present

## 2017-09-24 ENCOUNTER — Other Ambulatory Visit: Payer: Self-pay | Admitting: Internal Medicine

## 2017-10-18 DIAGNOSIS — R69 Illness, unspecified: Secondary | ICD-10-CM | POA: Diagnosis not present

## 2017-10-22 DIAGNOSIS — S61011A Laceration without foreign body of right thumb without damage to nail, initial encounter: Secondary | ICD-10-CM | POA: Diagnosis not present

## 2017-11-01 ENCOUNTER — Ambulatory Visit (INDEPENDENT_AMBULATORY_CARE_PROVIDER_SITE_OTHER): Payer: Medicare HMO | Admitting: Internal Medicine

## 2017-11-01 ENCOUNTER — Other Ambulatory Visit: Payer: Self-pay

## 2017-11-01 ENCOUNTER — Encounter: Payer: Self-pay | Admitting: Internal Medicine

## 2017-11-01 DIAGNOSIS — S61011D Laceration without foreign body of right thumb without damage to nail, subsequent encounter: Secondary | ICD-10-CM | POA: Diagnosis not present

## 2017-11-01 DIAGNOSIS — M546 Pain in thoracic spine: Secondary | ICD-10-CM

## 2017-11-01 DIAGNOSIS — R69 Illness, unspecified: Secondary | ICD-10-CM | POA: Diagnosis not present

## 2017-11-01 DIAGNOSIS — F411 Generalized anxiety disorder: Secondary | ICD-10-CM | POA: Diagnosis not present

## 2017-11-01 DIAGNOSIS — L989 Disorder of the skin and subcutaneous tissue, unspecified: Secondary | ICD-10-CM | POA: Diagnosis not present

## 2017-11-01 DIAGNOSIS — F430 Acute stress reaction: Secondary | ICD-10-CM

## 2017-11-01 MED ORDER — TRIAMCINOLONE ACETONIDE 0.1 % EX CREA: 1.0000 "application " | TOPICAL_CREAM | Freq: Two times a day (BID) | CUTANEOUS | 0 refills | Status: DC

## 2017-11-01 MED ORDER — TRAMADOL HCL 50 MG PO TABS: 50.0000 mg | ORAL_TABLET | Freq: Four times a day (QID) | ORAL | 5 refills | Status: DC | PRN

## 2017-11-01 NOTE — Patient Instructions (Signed)
Triamcinolone cream  Apply to area on our leg  twice daily, either  before or after moisturizing it   Use  Aveeno, cetaphil,  Eucerin  Or just plain vaseline  Nothing with a fragrance   if no improvement in 2 weeks  Need to see dermatology     I have sent a refill on the tramadol to your pharmacy
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
## 2017-11-04 DIAGNOSIS — L989 Disorder of the skin and subcutaneous tissue, unspecified: Secondary | ICD-10-CM | POA: Insufficient documentation

## 2017-11-04 DIAGNOSIS — S61011D Laceration without foreign body of right thumb without damage to nail, subsequent encounter: Secondary | ICD-10-CM | POA: Insufficient documentation

## 2017-11-04 NOTE — Assessment & Plan Note (Signed)
Since it is pruritic,  Will treat with triamcinolone for 2 weeks.  If it does not resolve,  Will rec she see dermatology fr biopsy

## 2017-11-04 NOTE — Assessment & Plan Note (Addendum)
Secondary to mild DDD.  Refill history confirmed via [place] Controlled Substance databas, accessed by me today..Tramadol refilled.
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
## 2017-11-04 NOTE — Assessment & Plan Note (Addendum)
Stitches removed without difficulty,  Thumb ROM normal.  TD vaccination is up to date

## 2017-11-29 ENCOUNTER — Other Ambulatory Visit: Payer: Self-pay | Admitting: Internal Medicine

## 2017-12-26 ENCOUNTER — Ambulatory Visit: Payer: Medicare HMO | Admitting: Internal Medicine

## 2017-12-26 DIAGNOSIS — Z0289 Encounter for other administrative examinations: Secondary | ICD-10-CM

## 2018-02-02 ENCOUNTER — Encounter: Payer: Self-pay | Admitting: Internal Medicine

## 2018-02-02 ENCOUNTER — Ambulatory Visit (INDEPENDENT_AMBULATORY_CARE_PROVIDER_SITE_OTHER): Payer: Medicare HMO | Admitting: Internal Medicine

## 2018-02-02 VITALS — BP 142/86 | HR 65 | Temp 98.0°F | Resp 14 | Ht 65.0 in | Wt 146.6 lb

## 2018-02-02 DIAGNOSIS — E559 Vitamin D deficiency, unspecified: Secondary | ICD-10-CM | POA: Diagnosis not present

## 2018-02-02 DIAGNOSIS — D509 Iron deficiency anemia, unspecified: Secondary | ICD-10-CM

## 2018-02-02 DIAGNOSIS — F4325 Adjustment disorder with mixed disturbance of emotions and conduct: Secondary | ICD-10-CM

## 2018-02-02 DIAGNOSIS — I10 Essential (primary) hypertension: Secondary | ICD-10-CM | POA: Diagnosis not present

## 2018-02-02 DIAGNOSIS — R1110 Vomiting, unspecified: Secondary | ICD-10-CM

## 2018-02-02 DIAGNOSIS — R7303 Prediabetes: Secondary | ICD-10-CM

## 2018-02-02 DIAGNOSIS — K222 Esophageal obstruction: Secondary | ICD-10-CM | POA: Diagnosis not present

## 2018-02-02 DIAGNOSIS — D631 Anemia in chronic kidney disease: Secondary | ICD-10-CM

## 2018-02-02 DIAGNOSIS — N189 Chronic kidney disease, unspecified: Secondary | ICD-10-CM

## 2018-02-02 DIAGNOSIS — R251 Tremor, unspecified: Secondary | ICD-10-CM

## 2018-02-02 DIAGNOSIS — R7989 Other specified abnormal findings of blood chemistry: Secondary | ICD-10-CM

## 2018-02-02 DIAGNOSIS — Z23 Encounter for immunization: Secondary | ICD-10-CM | POA: Diagnosis not present

## 2018-02-02 DIAGNOSIS — Z9884 Bariatric surgery status: Secondary | ICD-10-CM | POA: Diagnosis not present

## 2018-02-02 DIAGNOSIS — R69 Illness, unspecified: Secondary | ICD-10-CM | POA: Diagnosis not present

## 2018-02-02 LAB — POCT GLYCOSYLATED HEMOGLOBIN (HGB A1C): Hemoglobin A1C: 5.2 % (ref 4.0–5.6)

## 2018-02-02 LAB — GLUCOSE, POCT (MANUAL RESULT ENTRY): POC Glucose: 105 mg/dl — AB (ref 70–99)

## 2018-02-02 MED ORDER — PANTOPRAZOLE SODIUM 40 MG PO TBEC: 40.0000 mg | DELAYED_RELEASE_TABLET | Freq: Every day | ORAL | 3 refills | Status: DC

## 2018-02-02 MED ORDER — ACYCLOVIR 400 MG PO TABS
400.0000 mg | ORAL_TABLET | Freq: Every day | ORAL | 3 refills | Status: DC
Start: 2018-02-02 — End: 2022-05-13

## 2018-02-02 NOTE — Progress Notes (Signed)
Subjective:  Patient ID: Sherri Lee, female    DOB: 1955/12/15  Age: 62 y.o. MRN: [id number]  CC: The primary encounter diagnosis was Prediabetes. Diagnoses of Need for immunization against influenza, Anemia in chronic kidney disease, unspecified CKD stage, Essential hypertension, Status post gastric bypass for obesity, Iron deficiency anemia, unspecified iron deficiency anemia type, Tremor of both hands, Vitamin D deficiency, Adjustment disorder with mixed disturbance of emotions and conduct, Esophageal stricture, and Recurrent vomiting were also pertinent to this visit.  HPI Sherri Lee presents for persistent recurrent post prandial emesis.. Patient has a history of gastric bypass in  2013   And esophageal stricture at the GE junction  By SBFT  Done in April 2018. Anastomosis was intact and afferent loop did not fill.  Had EGD/colonoscopy in June  Esophagus was stretched.   Also reporting episodes of flushing , feeling shaky and weak in the afternoons,  Does not have DM so does not  check her BS.  NOt eating well,  Avoiding meals ,  Eating more junk food. Had potato chips and cookie at 1 :00  Today )apparenlty this stayed down)  And  Today's cbg is 105  Pizza, chicken tenders,  Baked ziti and salad/bread  :  All were vomiting   Micheal tyner did the surgery  Has not seen him in year,s.  patient currently retired.   Fever blisters on lips  abreva not working   Son is still living with her ,  Doing drugs,  On probation and in trouble with Engineer, drilling .  She has found needles in the house and he lies to her about current use.   Taking lexapro and lorazepam.  Stopped lamictal          Outpatient Medications Prior to Visit  Medication Sig Dispense Refill  . escitalopram (LEXAPRO) 20 MG tablet Take 20 mg by mouth daily.     Marland Kitchen gabapentin (NEURONTIN) 100 MG capsule LIMIT 1-3 CAPSULES BY MOUTH PER DAY IF TOLERATED 270 capsule 1  . LORazepam (ATIVAN) 1  MG tablet TAKE 1 TABLET BY MOUTH TWICE A DAY AS DIRECTED FOR ANXIETY/PANIC  2  . metoprolol succinate (TOPROL-XL) 50 MG 24 hr tablet TAKE 1 TABLET BY MOUTH EVERY DAY WITH OR IMMEDIATELY FOLLOWING A MEAL 90 tablet 1  . Multiple Vitamin (MULTIVITAMIN) tablet Take 1 tablet by mouth daily. Bariatric Vitamin    . traMADol (ULTRAM) 50 MG tablet Take 1 tablet (50 mg total) by mouth every 6 (six) hours as needed. 90 tablet 5  . zolpidem (AMBIEN) 10 MG tablet Take 10 mg by mouth at bedtime as needed for sleep.    . Biotin 1000 MCG tablet Take 1,000 mcg by mouth 3 (three) times daily.    . calcium carbonate (OS-CAL) 600 MG TABS Take 600 mg by mouth 2 (two) times daily with a meal.    . ferrous sulfate 325 (65 FE) MG EC tablet Take 325 mg by mouth daily.    Marland Kitchen lamoTRIgine (LAMICTAL) 200 MG tablet Take 200 mg by mouth daily.    Marland Kitchen triamcinolone cream (KENALOG) 0.1 % Apply 1 application topically 2 (two) times daily. (Patient not taking: Reported on 02/02/2018) 30 g 0  . pantoprazole (PROTONIX) 40 MG tablet Take 1 tablet (40 mg total) by mouth daily. (Patient not taking: Reported on 02/02/2018) 30 tablet 3   No facility-administered medications prior to visit.     Review of Systems;  Patient denies headache, fevers, malaise, unintentional weight  loss, skin rash, eye pain, sinus congestion and sinus pain, sore throat, dysphagia,  hemoptysis , cough, dyspnea, wheezing, chest pain, palpitations, orthopnea, edema, abdominal pain, nausea, melena, diarrhea, constipation, flank pain, dysuria, hematuria, urinary  Frequency, nocturia, numbness, tingling, seizures,  Focal weakness, Loss of consciousness,  Tremor, insomnia, depression, anxiety, and suicidal ideation.      Objective:  BP (!) 142/86 (BP Location: Left Arm, Patient Position: Sitting, Cuff Size: Normal)   Pulse 65   Temp 98 F (36.7 C) (Oral)   Resp 14   Ht 5\' 5"  (1.651 m)   Wt 146 lb 9.6 oz (66.5 kg)   SpO2 99%   BMI 24.40 kg/m   BP Readings  from Last 3 Encounters:  02/02/18 (!) 142/86  11/01/17 128/90  06/26/17 118/80    Wt Readings from Last 3 Encounters:  02/02/18 146 lb 9.6 oz (66.5 kg)  11/01/17 150 lb 3.2 oz (68.1 kg)  06/26/17 156 lb (70.8 kg)    General appearance: alert, cooperative and appears stated age Ears: normal TM's and external ear canals both ears Throat: lips, mucosa, and tongue normal; teeth and gums normal Neck: no adenopathy, no carotid bruit, supple, symmetrical, trachea midline and thyroid not enlarged, symmetric, no tenderness/mass/nodules Back: symmetric, no curvature. ROM normal. No CVA tenderness. Lungs: clear to auscultation bilaterally Heart: regular rate and rhythm, S1, S2 normal, no murmur, click, rub or gallop Abdomen: soft, non-tender; bowel sounds normal; no masses,  no organomegaly Pulses: 2+ and symmetric Skin: Skin color, texture, turgor normal. No rashes or lesions Lymph nodes: Cervical, supraclavicular, and axillary nodes normal.  Lab Results  Component Value Date   HGBA1C 5.2 02/02/2018   HGBA1C 5.9 09/04/2015   HGBA1C 5.9 09/10/2014    Lab Results  Component Value Date   CREATININE 0.67 02/02/2018   CREATININE 0.77 05/07/2017   CREATININE 0.68 03/31/2017    Lab Results  Component Value Date   WBC 7.4 05/07/2017   HGB 13.6 05/07/2017   HCT 41.3 05/07/2017   PLT 225 05/07/2017   GLUCOSE 95 02/02/2018   CHOL 170 09/04/2015   TRIG 63.0 09/04/2015   HDL 62.20 09/04/2015   LDLDIRECT 99.0 09/04/2015   LDLCALC 95 09/04/2015   ALT 15 02/02/2018   AST 21 02/02/2018   NA 141 02/02/2018   K 4.0 02/02/2018   CL 105 02/02/2018   CREATININE 0.67 02/02/2018   BUN 6 (L) 02/02/2018   CO2 23 02/02/2018   TSH 0.38 (L) 02/02/2018   HGBA1C 5.2 02/02/2018   MICROALBUR 0.6 09/04/2015    Dg Chest Port 1 View  Result Date: 05/07/2017 CLINICAL DATA:  62 year old female with chest pain. EXAM: PORTABLE CHEST 1 VIEW COMPARISON:  Chest radiograph dated 10/14/2011 FINDINGS: The  lungs are clear. There is no pleural effusion or pneumothorax. The cardiac silhouette is within normal limits. Partially visualized cervical fixation hardware. No acute osseous pathology. IMPRESSION: No active disease. Electronically Signed   By: Elgie Collard M.D.   On: 05/07/2017 01:53    Assessment & Plan:   Problem List Items Addressed This Visit    Adjustment disorder with mixed disturbance of emotions and conduct    Ongoing, secondary to son's drug abuse and recurrent arrests.  No changes to medication today since son is still living in her house, because she is unable to kick him out despite his continued criminal behavior, .  She is hoping that the justice system will solve her problems by taking him away.  Anemia in chronic kidney disease   Esophageal stricture    She has failed to continue PPI therapy despite recurrence of esophageal stricture requiring stretching.  explained role of PPIs and strongly advised to to resume therapy for life.       Essential hypertension   Relevant Orders   Comprehensive metabolic panel (Completed)   IDA (iron deficiency anemia)   Relevant Orders   Iron, TIBC and Ferritin Panel (Completed)   Prediabetes - Primary   Relevant Orders   POCT Glucose (CBG) (Completed)   POCT HgB A1C (Completed)   Recurrent vomiting    Not occurring with every meal,  EGD and colonoscopy done this summer.  Appears to be in reaction to overeating or eating foods that do not agree with her.  Advised her to return to bariatric clinic for a refresher course in  Proper eating habits post gasrectomy      Relevant Orders   Amb Referral to Nutrition and Diabetic E   Status post gastric bypass for obesity    Her diet is not healthy and certainly  not appropriate for a bariatric surgery patient.  Checking electrolytes,  Vitamin levels, nutritionist referral.  .      Relevant Orders   Vitamin B12 (Completed)   Tremor of both hands   Relevant Orders   TSH  (Completed)    Other Visit Diagnoses    Need for immunization against influenza       Relevant Orders   Flu Vaccine QUAD 36+ mos IM (Completed)   Vitamin D deficiency       Relevant Orders   VITAMIN D 25 Hydroxy (Vit-D Deficiency, Fractures) (Completed)    A total of 25  minutes of face to face time was spent with patient more than half of which was spent in counselling and coordination of care    I am having Sherri Lee start on acyclovir. I am also having her maintain her calcium carbonate, multivitamin, Biotin, escitalopram, ferrous sulfate, zolpidem, lamoTRIgine, LORazepam, gabapentin, triamcinolone cream, traMADol, metoprolol succinate, and pantoprazole.  Meds ordered this encounter  Medications  . acyclovir (ZOVIRAX) 400 MG tablet    Sig: Take 1 tablet (400 mg total) by mouth 5 (five) times daily.    Dispense:  35 tablet    Refill:  3  . pantoprazole (PROTONIX) 40 MG tablet    Sig: Take 1 tablet (40 mg total) by mouth daily.    Dispense:  30 tablet    Refill:  3    Medications Discontinued During This Encounter  Medication Reason  . pantoprazole (PROTONIX) 40 MG tablet Reorder    Follow-up: No follow-ups on file.   Sherlene Shamseresa L Maresha Anastos, MD

## 2018-02-02 NOTE — Patient Instructions (Addendum)
Your recurrent vomiting is due to choosing the wrong foods for a bariatric surgery patient   You need to follow a high protein lower carb diet to keep blood sugars stable and prevent bloating  Go back to [employer] for an excellent breakfast drink  Or lunch drik    Your esophageal stricture will recur if you do not treate= your GERD  Please resume taking  the protonix  Daily in the morning   Before all food and meds 30 minutes prior

## 2018-02-03 LAB — VITAMIN B12: Vitamin B-12: 876 pg/mL (ref 200–1100)

## 2018-02-03 LAB — COMPREHENSIVE METABOLIC PANEL
AG Ratio: 1.4 (calc) (ref 1.0–2.5)
ALT: 15 U/L (ref 6–29)
AST: 21 U/L (ref 10–35)
Albumin: 3.6 g/dL (ref 3.6–5.1)
Alkaline phosphatase (APISO): 73 U/L (ref 33–130)
BUN/Creatinine Ratio: 9 (calc) (ref 6–22)
BUN: 6 mg/dL — ABNORMAL LOW (ref 7–25)
CO2: 23 mmol/L (ref 20–32)
Calcium: 9.2 mg/dL (ref 8.6–10.4)
Chloride: 105 mmol/L (ref 98–110)
Creat: 0.67 mg/dL (ref 0.50–0.99)
Globulin: 2.6 g/dL (calc) (ref 1.9–3.7)
Glucose, Bld: 95 mg/dL (ref 65–99)
Potassium: 4 mmol/L (ref 3.5–5.3)
Sodium: 141 mmol/L (ref 135–146)
Total Bilirubin: 0.4 mg/dL (ref 0.2–1.2)
Total Protein: 6.2 g/dL (ref 6.1–8.1)

## 2018-02-03 LAB — IRON,TIBC AND FERRITIN PANEL
%SAT: 39 % (calc) (ref 16–45)
Ferritin: 77 ng/mL (ref 16–288)
Iron: 119 ug/dL (ref 45–160)
TIBC: 309 mcg/dL (calc) (ref 250–450)

## 2018-02-03 LAB — TSH: TSH: 0.38 mIU/L — ABNORMAL LOW (ref 0.40–4.50)

## 2018-02-03 LAB — VITAMIN D 25 HYDROXY (VIT D DEFICIENCY, FRACTURES): Vit D, 25-Hydroxy: 37 ng/mL (ref 30–100)

## 2018-02-04 DIAGNOSIS — R7989 Other specified abnormal findings of blood chemistry: Secondary | ICD-10-CM | POA: Insufficient documentation

## 2018-02-04 DIAGNOSIS — R1110 Vomiting, unspecified: Secondary | ICD-10-CM | POA: Insufficient documentation

## 2018-02-04 HISTORY — DX: Vomiting, unspecified: R11.10

## 2018-02-04 NOTE — Assessment & Plan Note (Signed)
Not occurring with every meal,  EGD and colonoscopy done this summer.  Appears to be in reaction to overeating or eating foods that do not agree with her.  Advised her to return to bariatric clinic for a refresher course in  Proper eating habits post gasrectomy

## 2018-02-04 NOTE — Assessment & Plan Note (Signed)
She has failed to continue PPI therapy despite recurrence of esophageal stricture requiring stretching.  explained role of PPIs and strongly advised to to resume therapy for life.

## 2018-02-04 NOTE — Assessment & Plan Note (Signed)
Her diet is not healthy and certainly  not appropriate for a bariatric surgery patient.  Checking electrolytes,  Vitamin levels, nutritionist referral.  .

## 2018-02-04 NOTE — Assessment & Plan Note (Signed)
Ongoing, secondary to son's drug abuse and recurrent arrests.  No changes to medication today since son is still living in her house, because she is unable to kick him out despite his continued criminal behavior, .  She is hoping that the justice system will solve her problems by taking him away.

## 2018-02-04 NOTE — Assessment & Plan Note (Signed)
TSH is low but she is taking a biotin supplement 

## 2018-02-07 ENCOUNTER — Other Ambulatory Visit: Payer: Self-pay | Admitting: Internal Medicine

## 2018-02-07 DIAGNOSIS — R7989 Other specified abnormal findings of blood chemistry: Secondary | ICD-10-CM

## 2018-03-12 ENCOUNTER — Other Ambulatory Visit (INDEPENDENT_AMBULATORY_CARE_PROVIDER_SITE_OTHER): Payer: Medicare HMO

## 2018-03-12 DIAGNOSIS — D509 Iron deficiency anemia, unspecified: Secondary | ICD-10-CM | POA: Diagnosis not present

## 2018-03-12 DIAGNOSIS — R7989 Other specified abnormal findings of blood chemistry: Secondary | ICD-10-CM | POA: Diagnosis not present

## 2018-03-13 LAB — T4 AND TSH
T4, Total: 6.1 ug/dL (ref 4.5–12.0)
TSH: 0.616 u[IU]/mL (ref 0.450–4.500)

## 2018-03-15 ENCOUNTER — Encounter: Payer: Self-pay | Admitting: *Deleted

## 2018-03-16 ENCOUNTER — Telehealth: Payer: Self-pay | Admitting: Internal Medicine

## 2018-03-16 NOTE — Telephone Encounter (Signed)
Copied from CRM [id number]. Topic: Quick Communication - See Telephone Encounter >> Mar 16, 2018  4:58 PM [name] wrote: CRM for notification. See Telephone encounter for: 03/16/18.  Patient is requesting her lab results she is waiting to her about her thyroid Please advise [phone]

## 2018-03-19 ENCOUNTER — Telehealth: Payer: Self-pay

## 2018-03-19 NOTE — Telephone Encounter (Signed)
Pt. Given results - verbalizes understanding. 

## 2018-03-19 NOTE — Telephone Encounter (Signed)
Left message for patient to return call back. PEC may give results.  

## 2018-03-21 ENCOUNTER — Other Ambulatory Visit: Payer: Self-pay | Admitting: Internal Medicine

## 2018-04-04 DIAGNOSIS — N952 Postmenopausal atrophic vaginitis: Secondary | ICD-10-CM | POA: Diagnosis not present

## 2018-04-04 DIAGNOSIS — N950 Postmenopausal bleeding: Secondary | ICD-10-CM | POA: Diagnosis not present

## 2018-04-16 ENCOUNTER — Other Ambulatory Visit: Payer: Self-pay | Admitting: Obstetrics and Gynecology

## 2018-04-16 ENCOUNTER — Telehealth: Payer: Self-pay | Admitting: Internal Medicine

## 2018-04-16 DIAGNOSIS — Z1231 Encounter for screening mammogram for malignant neoplasm of breast: Secondary | ICD-10-CM

## 2018-04-16 DIAGNOSIS — Z1239 Encounter for other screening for malignant neoplasm of breast: Secondary | ICD-10-CM | POA: Diagnosis not present

## 2018-04-16 DIAGNOSIS — R946 Abnormal results of thyroid function studies: Secondary | ICD-10-CM | POA: Diagnosis not present

## 2018-04-16 DIAGNOSIS — R030 Elevated blood-pressure reading, without diagnosis of hypertension: Secondary | ICD-10-CM | POA: Diagnosis not present

## 2018-04-16 NOTE — Telephone Encounter (Signed)
Copied from CRM [id number]. Topic: Referral - Request for Referral >> Apr 16, 2018  5:02 PM [name] wrote: Has patient seen PCP for this complaint? No *If NO, is insurance requiring patient see PCP for this issue before PCP can refer them? Referral for which specialty: Cardiology Preferred provider/office: [facility] [phone] Maralyn Sago [phone]-fax Reason for referral: Cardiology

## 2018-04-17 NOTE — Telephone Encounter (Signed)
I will not refer to a patient to a cardiologist without an office visit because I do not have a reason for the referral.

## 2018-04-19 NOTE — Telephone Encounter (Signed)
Spoke with pt and she hs been scheduled for tomorrow at 4:00pm.

## 2018-04-20 ENCOUNTER — Ambulatory Visit: Payer: Medicare HMO | Admitting: Internal Medicine

## 2018-05-02 ENCOUNTER — Other Ambulatory Visit: Payer: Self-pay | Admitting: Internal Medicine

## 2018-05-02 DIAGNOSIS — J0140 Acute pansinusitis, unspecified: Secondary | ICD-10-CM | POA: Diagnosis not present

## 2018-05-02 NOTE — Telephone Encounter (Signed)
Copied from CRM [id number]. Topic: Quick Communication - Rx Refill/Question >> May 02, 2018  6:38 PM Jilda Roche wrote: Medication: traMADol (ULTRAM) 50 MG tablet   Has the patient contacted their pharmacy? Yes.   (Agent: If no, request that the patient contact the pharmacy for the refill.) (Agent: If yes, when and what did the pharmacy advise?) Call office for refill  Preferred Pharmacy (with phone number or street name): [facility] [phone] [place], [place] - [address] [phone] (Phone) [phone] (Fax)    Agent: Please be advised that RX refills may take up to 3 business days. We ask that you follow-up with your pharmacy.

## 2018-05-03 ENCOUNTER — Other Ambulatory Visit: Payer: Self-pay | Admitting: Internal Medicine

## 2018-05-03 DIAGNOSIS — G8929 Other chronic pain: Secondary | ICD-10-CM

## 2018-05-03 MED ORDER — TRAMADOL HCL 50 MG PO TABS: 50.0000 mg | ORAL_TABLET | Freq: Four times a day (QID) | ORAL | 0 refills | Status: DC | PRN

## 2018-05-03 NOTE — Telephone Encounter (Signed)
Gave pt #30 pills when PCP returns request her normal quantity  Inform pt   Thanks TMS

## 2018-05-22 ENCOUNTER — Other Ambulatory Visit: Payer: Self-pay | Admitting: Internal Medicine

## 2018-05-22 MED ORDER — TRAMADOL HCL 50 MG PO TABS: 50.0000 mg | ORAL_TABLET | Freq: Four times a day (QID) | ORAL | 5 refills | Status: DC | PRN

## 2018-05-22 NOTE — Telephone Encounter (Signed)
Dr Shirlee Latch filled 30 tablets while PCP out office ok to fill?

## 2018-05-22 NOTE — Telephone Encounter (Signed)
Any idea why this reqfill request is so old?  (mid February)

## 2018-07-29 ENCOUNTER — Other Ambulatory Visit: Payer: Self-pay | Admitting: Internal Medicine

## 2018-08-03 ENCOUNTER — Ambulatory Visit: Payer: Medicare HMO | Admitting: Internal Medicine

## 2018-08-14 ENCOUNTER — Ambulatory Visit (INDEPENDENT_AMBULATORY_CARE_PROVIDER_SITE_OTHER): Payer: Medicare Other | Admitting: Internal Medicine

## 2018-08-14 ENCOUNTER — Encounter: Payer: Self-pay | Admitting: Internal Medicine

## 2018-08-14 ENCOUNTER — Other Ambulatory Visit: Payer: Self-pay

## 2018-08-14 DIAGNOSIS — R0789 Other chest pain: Secondary | ICD-10-CM | POA: Diagnosis not present

## 2018-08-14 DIAGNOSIS — I10 Essential (primary) hypertension: Secondary | ICD-10-CM | POA: Diagnosis not present

## 2018-08-14 DIAGNOSIS — R3915 Urgency of urination: Secondary | ICD-10-CM

## 2018-08-14 DIAGNOSIS — N3941 Urge incontinence: Secondary | ICD-10-CM

## 2018-08-14 DIAGNOSIS — R200 Anesthesia of skin: Secondary | ICD-10-CM

## 2018-08-14 DIAGNOSIS — D509 Iron deficiency anemia, unspecified: Secondary | ICD-10-CM

## 2018-08-14 DIAGNOSIS — M79644 Pain in right finger(s): Secondary | ICD-10-CM | POA: Diagnosis not present

## 2018-08-14 NOTE — Progress Notes (Signed)
Virtual Visit via [email or web address]  This visit type was conducted due to national recommendations for restrictions regarding the COVID-19 pandemic (e.g. social distancing).  This format is felt to be most appropriate for this patient at this time.  All issues noted in this document were discussed and addressed.  No physical exam was performed (except for noted visual exam findings with Video Visits).   I connected with@ on 08/14/18 at  3:30 PM EDT by a video enabled telemedicine application or telephone and verified that I am speaking with the correct person using two identifiers. Location patient: car/parked in a used car lot  Location provider: work or home office Persons participating in the virtual visit: patient, provider  I discussed the limitations, risks, security and privacy concerns of performing an evaluation and management service by telephone and the availability of in person appointments. I also discussed with the patient that there may be a patient responsible charge related to this service. The patient expressed understanding and agreed to proceed.  Reason for visit: follow up on multiple issues   HPI: 63 yr old female with history of gastric bypass , chronic pain , nausea and vomiting presents for follow up and management of multiple issues   The patient has no signs or symptoms of COVID 19 infection (fever, cough, sore throat  or shortness of breath beyond what is typical for patient).  Patient denies contact with other persons with the above mentioned symptoms or with anyone confirmed to have COVID 19 .  She has been minimizing her contact with the public and using a mask and hand sanitizer when she comes into any contact with the public.   She has had a tumultuous several months.   Her son overdosed on heroin ("again") the night her father had a stroke in April. Marland Kitchen. Her son is in an inpatient rehab program for 7-8 months and she has been caring for her father who ultimately needed  placement due to his left sided weakness ,  She has been having episodes of SSCP brought on by stressful events and is [facility] for a cardiac workup. The pain is not accompanied by dyspnea or nausea. She does not exercise .  She has been having urge incontinence for several weeks   Her right thumb has been numb since she lacerated it last August .  She is requesting refrepeerral to neurology   ROS: See pertinent positives and negatives per HPI.  Past Medical History:  Diagnosis Date  . Anemia    Low iron and hemoglobin  . Anginal pain (HCC)    states she thinks it is stress  . Anxiety   . Asthma   . Depression   . Dysrhythmia    states she has PVC's  . Fibromyalgia   . Hypertension   . Recurrent vomiting 02/04/2018  . Schatzki's ring    dialated 3 times by Markham JordanElliot     Past Surgical History:  Procedure Laterality Date  . ABDOMINAL ADHESION SURGERY    . BUNIONECTOMY     left foot   . CERVICAL FUSION    . CESAREAN SECTION    . CHOLECYSTECTOMY  June 2013  . GASTRIC BYPASS      Family History  Problem Relation Age of Onset  . Diabetes Mother   . Hypertension Mother   . Heart disease Father   . Breast cancer Maternal Aunt   . Cancer Maternal Aunt   . Colon cancer Neg Hx   .  Ovarian cancer Neg Hx     SOCIAL HX:  reports that she has never smoked. She has never used smokeless tobacco. She reports current alcohol use of about 3.0 standard drinks of alcohol per week. She reports that she does not use drugs.   Current Outpatient Medications:  .  acyclovir (ZOVIRAX) 400 MG tablet, Take 1 tablet (400 mg total) by mouth 5 (five) times daily., Disp: 35 tablet, Rfl: 3 .  calcium carbonate (OS-CAL) 600 MG TABS, Take 600 mg by mouth 2 (two) times daily with a meal., Disp: , Rfl:  .  escitalopram (LEXAPRO) 20 MG tablet, Take 20 mg by mouth daily. , Disp: , Rfl:  .  ferrous sulfate 325 (65 FE) MG EC tablet, Take 325 mg by mouth daily., Disp: , Rfl:   .  gabapentin (NEURONTIN) 100 MG capsule, LIMIT 1-3 CAPSULES BY MOUTH PER DAY IF TOLERATED, Disp: 270 capsule, Rfl: 1 .  LORazepam (ATIVAN) 1 MG tablet, TAKE 1 TABLET BY MOUTH TWICE A DAY AS DIRECTED FOR ANXIETY/PANIC, Disp: , Rfl: 2 .  metoprolol succinate (TOPROL-XL) 50 MG 24 hr tablet, TAKE 1 TABLET BY MOUTH EVERY DAY WITH OR IMMEDIATELY FOLLOWING A MEAL, Disp: 90 tablet, Rfl: 1 .  Multiple Vitamin (MULTIVITAMIN) tablet, Take 1 tablet by mouth daily. Bariatric Vitamin, Disp: , Rfl:  .  pantoprazole (PROTONIX) 40 MG tablet, TAKE 1 TABLET BY MOUTH EVERY DAY, Disp: 90 tablet, Rfl: 1 .  traMADol (ULTRAM) 50 MG tablet, Take 1 tablet (50 mg total) by mouth every 6 (six) hours as needed., Disp: 90 tablet, Rfl: 5 .  zolpidem (AMBIEN) 10 MG tablet, Take 10 mg by mouth at bedtime as needed for sleep., Disp: , Rfl:  .  lamoTRIgine (LAMICTAL) 200 MG tablet, Take 200 mg by mouth daily., Disp: , Rfl:   EXAM:  VITALS per patient if applicable:  GENERAL: alert, oriented, appears well and in no acute distress  HEENT: atraumatic, conjunttiva clear, no obvious abnormalities on inspection of external nose and ears  NECK: normal movements of the head and neck  LUNGS: on inspection no signs of respiratory distress, breathing rate appears normal, no obvious gross SOB, gasping or wheezing  CV: no obvious cyanosis  MS: moves all visible extremities without noticeable abnormality  PSYCH/NEURO: pleasant and cooperative, no obvious depression or anxiety, speech and thought processing grossly intact  ASSESSMENT AND PLAN:  Discussed the following assessment and plan:  Other chest pain - Plan: Ambulatory referral to Cardiology  Thumb pain, right - Plan: Ambulatory referral to Neurology  Numbness of right thumb - Plan: Ambulatory referral to Neurology, Vitamin B12  Iron deficiency anemia, unspecified iron deficiency anemia type - Plan: Iron, TIBC and Ferritin Panel, CBC with  Differential/Platelet  Essential hypertension - Plan: Comprehensive metabolic panel, Lipid panel  Urinary urgency - Plan: Urinalysis, Routine w reflex microscopic, Urine Culture  Urge incontinence of urine  IDA (iron deficiency anemia) Likely dietary given EGD and colonoscopy last year. Taking iron once daily.  Checking iron stores   Urge incontinence of urine New onset.  She is up to date on colonoscopy and is s/p abdominal hysterectomy.  Rule out UTI,  Prior to medication trial   Numbness of right thumb Following laceration in August 2019.  Referral to neurology for testing   Abnormal thyroid blood test TSH is low but she is taking a biotin supplement    I discussed the assessment and treatment plan with the patient. The patient was provided an opportunity to  ask questions and all were answered. The patient agreed with the plan and demonstrated an understanding of the instructions.   The patient was advised to call back or seek an in-person evaluation if the symptoms worsen or if the condition fails to improve as anticipated.  I provided 40 minutes of non-face-to-face time during this encounter.   [name], MD

## 2018-08-15 ENCOUNTER — Encounter: Payer: Self-pay | Admitting: Internal Medicine

## 2018-08-15 DIAGNOSIS — S5420XA Injury of radial nerve at forearm level, unspecified arm, initial encounter: Secondary | ICD-10-CM | POA: Insufficient documentation

## 2018-08-15 DIAGNOSIS — N3941 Urge incontinence: Secondary | ICD-10-CM | POA: Insufficient documentation

## 2018-08-15 DIAGNOSIS — R200 Anesthesia of skin: Secondary | ICD-10-CM | POA: Insufficient documentation

## 2018-08-15 NOTE — Assessment & Plan Note (Signed)
TSH is low but she is taking a biotin supplement

## 2018-08-15 NOTE — Assessment & Plan Note (Addendum)
New onset.  She is up to date on colonoscopy and is s/p abdominal hysterectomy.  Rule out UTI,  Prior to medication trial

## 2018-08-15 NOTE — Assessment & Plan Note (Signed)
Following laceration in August 2019.  Referral to neurology for testing

## 2018-08-15 NOTE — Assessment & Plan Note (Addendum)
Likely dietary given EGD and colonoscopy last year. Taking iron once daily.  Checking iron stores

## 2018-08-17 NOTE — Progress Notes (Signed)
Mailbox is full.

## 2018-08-21 NOTE — Progress Notes (Signed)
Lab appt has been scheduled and pt is aware of appt date and time.  

## 2018-08-22 ENCOUNTER — Other Ambulatory Visit: Payer: Medicare Other

## 2018-08-28 DIAGNOSIS — R200 Anesthesia of skin: Secondary | ICD-10-CM | POA: Diagnosis not present

## 2018-09-03 ENCOUNTER — Other Ambulatory Visit: Payer: Self-pay | Admitting: Neurology

## 2018-09-03 DIAGNOSIS — M5412 Radiculopathy, cervical region: Secondary | ICD-10-CM

## 2018-09-18 ENCOUNTER — Ambulatory Visit: Payer: Self-pay | Admitting: Internal Medicine

## 2018-09-18 NOTE — Telephone Encounter (Signed)
  Pt. Reports she started feeling bad Saturday - fatigue, congestion, cough, nausea and vomiting. Taking care of her elderly father who recently had a stroke and would like to be tested for COVID 19 . Warm transfer to [facility] in the practice. Answer Assessment - Initial Assessment Questions 1. COVID-19 DIAGNOSIS: "Who made your Coronavirus (COVID-19) diagnosis?" "Was it confirmed by a positive lab test?" If not diagnosed by a HCP, ask "Are there lots of cases (community spread) where you live?" (See public health department website, if unsure)     No test 2. ONSET: "When did the COVID-19 symptoms start?"      Saturday 3. WORST SYMPTOM: "What is your worst symptom?" (e.g., cough, fever, shortness of breath, muscle aches)     Fatigue, burning in chest 4. COUGH: "Do you have a cough?" If so, ask: "How bad is the cough?"       Cough 5. FEVER: "Do you have a fever?" If so, ask: "What is your temperature, how was it measured, and when did it start?"     No 6. RESPIRATORY STATUS: "Describe your breathing?" (e.g., shortness of breath, wheezing, unable to speak)      No 7. BETTER-SAME-WORSE: "Are you getting better, staying the same or getting worse compared to yesterday?"  If getting worse, ask, "In what way?"     Worse 8. HIGH RISK DISEASE: "Do you have any chronic medical problems?" (e.g., asthma, heart or lung disease, weak immune system, etc.)     Asthma 9. PREGNANCY: "Is there any chance you are pregnant?" "When was your last menstrual period?"     No 10. OTHER SYMPTOMS: "Do you have any other symptoms?"  (e.g., chills, fatigue, headache, loss of smell or taste, muscle pain, sore throat)       Congestion, nausea and vomiting, cough  Protocols used: CORONAVIRUS (COVID-19) DIAGNOSED OR SUSPECTED-A-AH

## 2018-09-18 NOTE — Telephone Encounter (Signed)
appt tomorrow 11 am

## 2018-09-19 ENCOUNTER — Ambulatory Visit: Payer: Medicare Other | Admitting: Family Medicine

## 2018-09-19 ENCOUNTER — Telehealth: Payer: Self-pay | Admitting: Family Medicine

## 2018-09-19 DIAGNOSIS — Z0289 Encounter for other administrative examinations: Secondary | ICD-10-CM

## 2018-09-19 NOTE — Telephone Encounter (Signed)
Called x2, no answer  DOXY text invite x2, no log on to to doxy  Had appt scheduled today with LGUSE FNP at 11 AM

## 2018-09-20 ENCOUNTER — Ambulatory Visit
Admission: RE | Admit: 2018-09-20 | Discharge: 2018-09-20 | Disposition: A | Discharge status: Home / Self Care | Payer: Medicare Other | Source: Ambulatory Visit | Attending: Neurology | Admitting: Neurology

## 2018-09-20 ENCOUNTER — Other Ambulatory Visit: Payer: Self-pay

## 2018-09-20 DIAGNOSIS — M5412 Radiculopathy, cervical region: Secondary | ICD-10-CM | POA: Diagnosis not present

## 2018-09-20 DIAGNOSIS — M542 Cervicalgia: Secondary | ICD-10-CM | POA: Diagnosis not present

## 2018-09-24 ENCOUNTER — Telehealth: Payer: Self-pay | Admitting: Family Medicine

## 2018-09-24 ENCOUNTER — Ambulatory Visit (INDEPENDENT_AMBULATORY_CARE_PROVIDER_SITE_OTHER): Payer: Medicare Other | Admitting: Family Medicine

## 2018-09-24 ENCOUNTER — Encounter: Payer: Self-pay | Admitting: Family Medicine

## 2018-09-24 ENCOUNTER — Other Ambulatory Visit: Payer: Self-pay

## 2018-09-24 DIAGNOSIS — Z20828 Contact with and (suspected) exposure to other viral communicable diseases: Secondary | ICD-10-CM

## 2018-09-24 DIAGNOSIS — R05 Cough: Secondary | ICD-10-CM

## 2018-09-24 DIAGNOSIS — R059 Cough, unspecified: Secondary | ICD-10-CM

## 2018-09-24 DIAGNOSIS — Z20822 Contact with and (suspected) exposure to covid-19: Secondary | ICD-10-CM

## 2018-09-24 DIAGNOSIS — R5383 Other fatigue: Secondary | ICD-10-CM | POA: Diagnosis not present

## 2018-09-24 DIAGNOSIS — R6889 Other general symptoms and signs: Secondary | ICD-10-CM | POA: Diagnosis not present

## 2018-09-24 NOTE — Progress Notes (Signed)
Patient ID: Sherri Lee, female   DOB: 21-Jun-1955, 63 y.o.   MRN: [id number]    Virtual Visit via Video/Phone Note  This visit type was conducted due to national recommendations for restrictions regarding the COVID-19 pandemic (e.g. social distancing).  This format is felt to be most appropriate for this patient at this time.  All issues noted in this document were discussed and addressed.  No physical exam was performed (except for noted visual exam findings with Video Visits).   I connected with Sherri Lee today at  2:00 PM EDT by a video enabled telemedicine application and verified that I am speaking with the correct person using two identifiers. Location patient: home Location provider: work or home office Persons participating in the virtual visit: patient, provider  I discussed the limitations, risks, security and privacy concerns of performing an evaluation and management service by telephone and the availability of in person appointments. I also discussed with the patient that there may be a patient responsible charge related to this service. The patient expressed understanding and agreed to proceed.  Interactive audio and video telecommunications were attempted between this provider and patient, however failed, due to patient having technical difficulties. We were able to connect and see each other over video but unable to hear. 2 more connection attempts made, unsuccessful -- completed visit over phone.   HPI:  Patient and I connected via video, but were unable to hear each other so after tumor connection attempts we finished visit over phone to discuss symptoms of cough, generalized fatigue for 2 to 3 days.  Patient is concerned that she potentially could have COVID-19 due to the cough.  She would like to be tested due to having to care for her elderly parents.  Patient has been wearing a mask when going out of the home and been washing hands, but states she did  travel to [place] 2 weeks ago and was around more people when walking around the sidewalks in the downtown area.  Denies chest pain, shortness of breath or wheezing.  Denies fever or chills.  Denies body aches.  Denies GU or GI complaints.  Patient states she does vomit on occasion, but this is not a new issue this is a long-term problem related to gastric bypass surgery   ROS: See pertinent positives and negatives per HPI.  Past Medical History:  Diagnosis Date  . Anemia    Low iron and hemoglobin  . Anginal pain (HCC)    states she thinks it is stress  . Anxiety   . Asthma   . Depression   . Dysrhythmia    states she has PVC's  . Fibromyalgia   . Hypertension   . Recurrent vomiting 02/04/2018  . Schatzki's ring    dialated 3 times by Markham JordanElliot     Past Surgical History:  Procedure Laterality Date  . ABDOMINAL ADHESION SURGERY    . BUNIONECTOMY     left foot   . CERVICAL FUSION    . CESAREAN SECTION    . CHOLECYSTECTOMY  June 2013  . GASTRIC BYPASS      Family History  Problem Relation Age of Onset  . Diabetes Mother   . Hypertension Mother   . Heart disease Father   . Breast cancer Maternal Aunt   . Cancer Maternal Aunt   . Colon cancer Neg Hx   . Ovarian cancer Neg Hx    Social History   Tobacco Use  . Smoking status: Never Smoker  .  Smokeless tobacco: Never Used  Substance Use Topics  . Alcohol use: Yes    Alcohol/week: 3.0 standard drinks    Types: 3 Glasses of wine per week    Comment: once weekly     Current Outpatient Medications:  .  acyclovir (ZOVIRAX) 400 MG tablet, Take 1 tablet (400 mg total) by mouth 5 (five) times daily., Disp: 35 tablet, Rfl: 3 .  calcium carbonate (OS-CAL) 600 MG TABS, Take 600 mg by mouth 2 (two) times daily with a meal., Disp: , Rfl:  .  escitalopram (LEXAPRO) 20 MG tablet, Take 20 mg by mouth daily. , Disp: , Rfl:  .  ferrous sulfate 325 (65 FE) MG EC tablet, Take 325 mg by mouth daily., Disp: , Rfl:  .  gabapentin  (NEURONTIN) 100 MG capsule, LIMIT 1-3 CAPSULES BY MOUTH PER DAY IF TOLERATED, Disp: 270 capsule, Rfl: 1 .  lamoTRIgine (LAMICTAL) 200 MG tablet, Take 200 mg by mouth daily., Disp: , Rfl:  .  LORazepam (ATIVAN) 1 MG tablet, TAKE 1 TABLET BY MOUTH TWICE A DAY AS DIRECTED FOR ANXIETY/PANIC, Disp: , Rfl: 2 .  metoprolol succinate (TOPROL-XL) 50 MG 24 hr tablet, TAKE 1 TABLET BY MOUTH EVERY DAY WITH OR IMMEDIATELY FOLLOWING A MEAL, Disp: 90 tablet, Rfl: 1 .  Multiple Vitamin (MULTIVITAMIN) tablet, Take 1 tablet by mouth daily. Bariatric Vitamin, Disp: , Rfl:  .  pantoprazole (PROTONIX) 40 MG tablet, TAKE 1 TABLET BY MOUTH EVERY DAY, Disp: 90 tablet, Rfl: 1 .  traMADol (ULTRAM) 50 MG tablet, Take 1 tablet (50 mg total) by mouth every 6 (six) hours as needed., Disp: 90 tablet, Rfl: 5 .  zolpidem (AMBIEN) 10 MG tablet, Take 10 mg by mouth at bedtime as needed for sleep., Disp: , Rfl:   EXAM:  GENERAL: alert, oriented, appears well and in no acute distress  HEENT: atraumatic, conjunttiva clear, no obvious abnormalities on inspection of external nose and ears  NECK: normal movements of the head and neck  LUNGS: on inspection no signs of respiratory distress, breathing rate appears normal, no obvious gross SOB, gasping or wheezing  CV: no obvious cyanosis  MS: moves all visible extremities without noticeable abnormality  PSYCH/NEURO: pleasant and cooperative, no obvious depression or anxiety, speech and thought processing grossly intact  ASSESSMENT AND PLAN:  Discussed the following assessment and plan:  Suspected COVID-19 virus infection, fatigue, cough - advised that due to symptoms, we need to get patient set up for COVID-19 testing.  Patient advised that a nurse will be calling her to give her time and location to drive up for drive-through testing.  Patient advised that testing is taking 2 to 7 days to result, and while we are awaiting results patient must remain under self quarantine and  monitor for any changing/worsening symptoms.  Advised over-the-counter medications such as Tylenol can be used to help treat pain or fevers, Robitussin can be used to help calm cough, allergy medication such as Claritin or Allegra can help reduce congestion.  Also discussed getting plenty of rest and increasing fluid intake.  Made patient aware that test results as well as how his symptoms progress will determine when the self quarantine will be able to end.  Also advised to monitor self for any worsening symptoms, advised if severe shortness of breath develops, high fever that is not reduced with use of Tylenol, chest pain, severe vomiting or diarrhea  --patient must call on-call and or go to ER right away for evaluation. patient verbalized  understanding of these instructions.    I discussed the assessment and treatment plan with the patient. The patient was provided an opportunity to ask questions and all were answered. The patient agreed with the plan and demonstrated an understanding of the instructions.   The patient was advised to call back or seek an in-person evaluation if the symptoms worsen or if the condition fails to improve as anticipated.  I provided 15 minutes of non-face-to-face time over phone during this encounter.   Jodelle Green, FNP

## 2018-09-24 NOTE — Telephone Encounter (Signed)
Sherri Lee  01-Sep-1955  [id number]  Needs covid test: cough, fatigue  [employer], [id number]

## 2018-09-24 NOTE — Telephone Encounter (Signed)
Contacted pt, Sherri Lee is completely full today, so pt has been scheduled for tomorrow 09/25/18 at 3:00 for the [place] site. Pt is aware to remain in her car and wear a mask. Pt understood and had no additional questions at this time. Nothing further is needed  Order was placed

## 2018-09-25 ENCOUNTER — Other Ambulatory Visit: Payer: Medicare Other

## 2018-09-25 DIAGNOSIS — Z20822 Contact with and (suspected) exposure to covid-19: Secondary | ICD-10-CM

## 2018-09-25 DIAGNOSIS — R6889 Other general symptoms and signs: Secondary | ICD-10-CM | POA: Diagnosis not present

## 2018-09-28 DIAGNOSIS — M79641 Pain in right hand: Secondary | ICD-10-CM | POA: Diagnosis not present

## 2018-09-28 DIAGNOSIS — M5030 Other cervical disc degeneration, unspecified cervical region: Secondary | ICD-10-CM | POA: Diagnosis not present

## 2018-09-28 LAB — NOVEL CORONAVIRUS, NAA: SARS-CoV-2, NAA: NOT DETECTED

## 2018-10-05 ENCOUNTER — Other Ambulatory Visit: Payer: Medicare Other

## 2018-10-08 ENCOUNTER — Ambulatory Visit: Payer: Medicare Other | Admitting: Occupational Therapy

## 2018-10-21 ENCOUNTER — Encounter: Payer: Self-pay | Admitting: Emergency Medicine

## 2018-10-21 ENCOUNTER — Emergency Department
Admission: EM | Admit: 2018-10-21 | Discharge: 2018-10-21 | Disposition: A | Discharge status: Home / Self Care | Payer: Medicare Other | Attending: Emergency Medicine | Admitting: Emergency Medicine

## 2018-10-21 ENCOUNTER — Other Ambulatory Visit: Payer: Self-pay

## 2018-10-21 ENCOUNTER — Emergency Department: Payer: Medicare Other

## 2018-10-21 DIAGNOSIS — S32038A Other fracture of third lumbar vertebra, initial encounter for closed fracture: Secondary | ICD-10-CM | POA: Diagnosis not present

## 2018-10-21 DIAGNOSIS — S199XXA Unspecified injury of neck, initial encounter: Secondary | ICD-10-CM | POA: Diagnosis not present

## 2018-10-21 DIAGNOSIS — S32028A Other fracture of second lumbar vertebra, initial encounter for closed fracture: Secondary | ICD-10-CM | POA: Insufficient documentation

## 2018-10-21 DIAGNOSIS — J45909 Unspecified asthma, uncomplicated: Secondary | ICD-10-CM | POA: Diagnosis not present

## 2018-10-21 DIAGNOSIS — W109XXA Fall (on) (from) unspecified stairs and steps, initial encounter: Secondary | ICD-10-CM | POA: Insufficient documentation

## 2018-10-21 DIAGNOSIS — Y92009 Unspecified place in unspecified non-institutional (private) residence as the place of occurrence of the external cause: Secondary | ICD-10-CM | POA: Diagnosis not present

## 2018-10-21 DIAGNOSIS — S32018A Other fracture of first lumbar vertebra, initial encounter for closed fracture: Secondary | ICD-10-CM | POA: Insufficient documentation

## 2018-10-21 DIAGNOSIS — Y939 Activity, unspecified: Secondary | ICD-10-CM | POA: Diagnosis not present

## 2018-10-21 DIAGNOSIS — R51 Headache: Secondary | ICD-10-CM | POA: Insufficient documentation

## 2018-10-21 DIAGNOSIS — S32029A Unspecified fracture of second lumbar vertebra, initial encounter for closed fracture: Secondary | ICD-10-CM | POA: Diagnosis not present

## 2018-10-21 DIAGNOSIS — I10 Essential (primary) hypertension: Secondary | ICD-10-CM | POA: Diagnosis not present

## 2018-10-21 DIAGNOSIS — S32019A Unspecified fracture of first lumbar vertebra, initial encounter for closed fracture: Secondary | ICD-10-CM | POA: Diagnosis not present

## 2018-10-21 DIAGNOSIS — S3992XA Unspecified injury of lower back, initial encounter: Secondary | ICD-10-CM | POA: Diagnosis present

## 2018-10-21 DIAGNOSIS — S0990XA Unspecified injury of head, initial encounter: Secondary | ICD-10-CM | POA: Diagnosis not present

## 2018-10-21 DIAGNOSIS — S5012XA Contusion of left forearm, initial encounter: Secondary | ICD-10-CM | POA: Diagnosis not present

## 2018-10-21 DIAGNOSIS — Z79899 Other long term (current) drug therapy: Secondary | ICD-10-CM | POA: Insufficient documentation

## 2018-10-21 DIAGNOSIS — Y999 Unspecified external cause status: Secondary | ICD-10-CM | POA: Insufficient documentation

## 2018-10-21 DIAGNOSIS — S32009A Unspecified fracture of unspecified lumbar vertebra, initial encounter for closed fracture: Secondary | ICD-10-CM

## 2018-10-21 DIAGNOSIS — S32039A Unspecified fracture of third lumbar vertebra, initial encounter for closed fracture: Secondary | ICD-10-CM | POA: Diagnosis not present

## 2018-10-21 MED ORDER — MORPHINE SULFATE (PF) 4 MG/ML IV SOLN
4.0000 mg | Freq: Once | INTRAVENOUS | Status: AC
  Administered 2018-10-21: 4 mg via INTRAVENOUS
  Filled 2018-10-21: qty 1

## 2018-10-21 MED ORDER — OXYCODONE-ACETAMINOPHEN 5-325 MG PO TABS
1.0000 | ORAL_TABLET | Freq: Once | ORAL | Status: AC
  Administered 2018-10-21: 1 via ORAL
  Filled 2018-10-21: qty 1

## 2018-10-21 MED ORDER — OXYCODONE-ACETAMINOPHEN 7.5-325 MG PO TABS: 1.0000 | ORAL_TABLET | ORAL | 0 refills | Status: DC | PRN

## 2018-10-21 MED ORDER — ONDANSETRON HCL 4 MG/2ML IJ SOLN
4.0000 mg | Freq: Once | INTRAMUSCULAR | Status: AC
  Administered 2018-10-21: 4 mg via INTRAVENOUS
  Filled 2018-10-21: qty 2

## 2018-10-21 NOTE — Consult Note (Signed)
ORTHOPAEDIC CONSULTATION  REQUESTING PHYSICIAN: Shaune PollackIsaacs, Cameron, MD  Chief Complaint: Low back pain that is post fall  HPI: Called by Greig RightSusan Fisher, PA in the ER regarding Sherri Lee who is a 63 y.o. female who complains of low back pain after a fall down stairs overnight.  Patient denies any numbness or tingling.  CT scans of the head cervical spine and lumbar spine were performed today in the ER.  I reviewed the CT scan of the cervical spine and lumbar spine.  Patient has no acute intracranial abnormality.  Her C-spine CT is negative for acute fracture.  She has an ACDF with solid fusion from C4-C7.  She has multilevel spondylosis.  She has fractures of the transverse process of L1, L2 and L3 which appear acute.  There is no vertebral body fractures.  She has multilevel degenerative changes.  Patient is reported to be neurovascular intact.  For her low back pain I recommended a lumbosacral corset.  She may follow-up with neurosurgery next week in [facility] to discuss her transverse process fractures which I do not anticipate will require surgical intervention.  Past Medical History:  Diagnosis Date  . Anemia    Low iron and hemoglobin  . Anginal pain (HCC)    states she thinks it is stress  . Anxiety   . Asthma   . Depression   . Dysrhythmia    states she has PVC's  . Fibromyalgia   . Hypertension   . Recurrent vomiting 02/04/2018  . Schatzki's ring    dialated 3 times by Markham JordanElliot    Past Surgical History:  Procedure Laterality Date  . ABDOMINAL ADHESION SURGERY    . BUNIONECTOMY     left foot   . CERVICAL FUSION    . CESAREAN SECTION    . CHOLECYSTECTOMY  June 2013  . GASTRIC BYPASS     Social History   Socioeconomic History  . Marital status: Divorced    Spouse name: Not on file  . Number of children: Not on file  . Years of education: Not on file  . Highest education level: Not on file  Occupational History  . Not on file  Social Needs  .  Financial resource strain: Not on file  . Food insecurity    Worry: Not on file    Inability: Not on file  . Transportation needs    Medical: Not on file    Non-medical: Not on file  Tobacco Use  . Smoking status: Never Smoker  . Smokeless tobacco: Never Used  Substance and Sexual Activity  . Alcohol use: Yes    Alcohol/week: 3.0 standard drinks    Types: 3 Glasses of wine per week    Comment: once weekly  . Drug use: No  . Sexual activity: Yes  Lifestyle  . Physical activity    Days per week: Not on file    Minutes per session: Not on file  . Stress: Not on file  Relationships  . Social Musicianconnections    Talks on phone: Not on file    Gets together: Not on file    Attends religious service: Not on file    Active member of club or organization: Not on file    Attends meetings of clubs or organizations: Not on file    Relationship status: Not on file  Other Topics Concern  . Not on file  Social History Narrative   Lives with spouse, son, and stepson who has serious psychiatric issues  and alcohol abuse   Family History  Problem Relation Age of Onset  . Diabetes Mother   . Hypertension Mother   . Heart disease Father   . Breast cancer Maternal Aunt   . Cancer Maternal Aunt   . Colon cancer Neg Hx   . Ovarian cancer Neg Hx    Allergies  Allergen Reactions  . Bacitracin-Polymyxin B Rash  . Eggs Or Egg-Derived Products   . Betadine [Povidone Iodine] Rash   Prior to Admission medications   Medication Sig Start Date End Date Taking? Authorizing Provider  acyclovir (ZOVIRAX) 400 MG tablet Take 1 tablet (400 mg total) by mouth 5 (five) times daily. 02/02/18   Sherlene Shamsullo, Teresa L, MD  calcium carbonate (OS-CAL) 600 MG TABS Take 600 mg by mouth 2 (two) times daily with a meal.    [provider]  escitalopram (LEXAPRO) 20 MG tablet Take 20 mg by mouth daily.     [provider]  ferrous sulfate 325 (65 FE) MG EC tablet Take 325 mg by mouth daily.    [provider]  gabapentin (NEURONTIN) 100 MG capsule LIMIT 1-3 CAPSULES BY MOUTH PER DAY IF TOLERATED 03/21/18   Sherlene Shamsullo, Teresa L, MD  lamoTRIgine (LAMICTAL) 200 MG tablet Take 200 mg by mouth daily.    [provider]  LORazepam (ATIVAN) 1 MG tablet TAKE 1 TABLET BY MOUTH TWICE A DAY AS DIRECTED FOR ANXIETY/PANIC 06/21/17   [provider]  metoprolol succinate (TOPROL-XL) 50 MG 24 hr tablet TAKE 1 TABLET BY MOUTH EVERY DAY WITH OR IMMEDIATELY FOLLOWING A MEAL 05/22/18   Sherlene Shamsullo, Teresa L, MD  Multiple Vitamin (MULTIVITAMIN) tablet Take 1 tablet by mouth daily. Bariatric Vitamin    [provider]  pantoprazole (PROTONIX) 40 MG tablet TAKE 1 TABLET BY MOUTH EVERY DAY 07/30/18   Sherlene Shamsullo, Teresa L, MD  traMADol (ULTRAM) 50 MG tablet Take 1 tablet (50 mg total) by mouth every 6 (six) hours as needed. 05/22/18   Sherlene Shamsullo, Teresa L, MD  zolpidem (AMBIEN) 10 MG tablet Take 10 mg by mouth at bedtime as needed for sleep.    [provider]   Ct Head Wo Contrast  Result Date: 10/21/2018 CLINICAL DATA:  Fall last night.  Head injury EXAM: CT HEAD WITHOUT CONTRAST CT CERVICAL SPINE WITHOUT CONTRAST TECHNIQUE: Multidetector CT imaging of the head and cervical spine was performed following the standard protocol without intravenous contrast. Multiplanar CT image reconstructions of the cervical spine were also generated. COMPARISON:  CT head 08/20/2015 FINDINGS: CT HEAD FINDINGS Brain: No evidence of acute infarction, hemorrhage, hydrocephalus, extra-axial collection or mass lesion/mass effect. Vascular: Negative for hyperdense vessel Skull: Negative for fracture Sinuses/Orbits: Paranasal sinuses clear. Bilateral cataract surgery. Other: None CT CERVICAL SPINE FINDINGS Alignment: Mild anterolisthesis C3-4 and C7-T1 Skull base and vertebrae: Negative for fracture. ACDF C4 through C7. Soft tissues and spinal canal: Negative for mass or soft tissue swelling. 9 mm cyst left thyroid lobe. Disc levels:  Advanced facet degeneration on the left at C3-4 causing left foraminal encroachment. Moderate right foraminal encroachment due to spurring. ACDF with solid fusion at C4-5, C5-6, C6-7 without significant stenosis Advanced facet degeneration bilaterally C7-T1 and T1-2. Mild foraminal narrowing bilaterally at C7-T1. Upper chest: Lung apices clear bilaterally Other: None IMPRESSION: 1. No acute intracranial abnormality 2. Negative for acute cervical spine fracture 3. ACDF with solid fusion C4 through C7.  Multilevel spondylosis. Electronically Signed   By: Marlan Palauharles  Clark M.D.   On: 10/21/2018  13:06   Ct Cervical Spine Wo Contrast  Result Date: 10/21/2018 CLINICAL DATA:  Fall last night.  Head injury EXAM: CT HEAD WITHOUT CONTRAST CT CERVICAL SPINE WITHOUT CONTRAST TECHNIQUE: Multidetector CT imaging of the head and cervical spine was performed following the standard protocol without intravenous contrast. Multiplanar CT image reconstructions of the cervical spine were also generated. COMPARISON:  CT head 08/20/2015 FINDINGS: CT HEAD FINDINGS Brain: No evidence of acute infarction, hemorrhage, hydrocephalus, extra-axial collection or mass lesion/mass effect. Vascular: Negative for hyperdense vessel Skull: Negative for fracture Sinuses/Orbits: Paranasal sinuses clear. Bilateral cataract surgery. Other: None CT CERVICAL SPINE FINDINGS Alignment: Mild anterolisthesis C3-4 and C7-T1 Skull base and vertebrae: Negative for fracture. ACDF C4 through C7. Soft tissues and spinal canal: Negative for mass or soft tissue swelling. 9 mm cyst left thyroid lobe. Disc levels: Advanced facet degeneration on the left at C3-4 causing left foraminal encroachment. Moderate right foraminal encroachment due to spurring. ACDF with solid fusion at C4-5, C5-6, C6-7 without significant stenosis Advanced facet degeneration bilaterally C7-T1 and T1-2. Mild foraminal narrowing bilaterally at C7-T1. Upper chest: Lung apices clear bilaterally Other:  None IMPRESSION: 1. No acute intracranial abnormality 2. Negative for acute cervical spine fracture 3. ACDF with solid fusion C4 through C7.  Multilevel spondylosis. Electronically Signed   By: Franchot Gallo M.D.   On: 10/21/2018 13:06   Ct Lumbar Spine Wo Contrast  Result Date: 10/21/2018 CLINICAL DATA:  Fall last night.  Back pain EXAM: CT LUMBAR SPINE WITHOUT CONTRAST TECHNIQUE: Multidetector CT imaging of the lumbar spine was performed without intravenous contrast administration. Multiplanar CT image reconstructions were also generated. COMPARISON:  None. FINDINGS: Segmentation: Normal segmentation. S1 is largely lumbarized with a fully developed disc space at S1-2. Iliolumbar ligament attached to the transverse process of L5. Alignment: Normal Vertebrae: No vertebral body fracture. Fractures of the transverse process tips of L1, L2, and L3. No posterior element fractures. Paraspinal and other soft tissues: Negative for paraspinous mass or edema. Disc levels: L1-2: Negative L2-3: Mild disc degeneration.  Negative for stenosis. L3-4: Mild disc degeneration without stenosis. Mild facet degeneration L4-5: Moderate disc bulging and moderate facet degeneration. Moderate spinal stenosis. Left foraminal encroachment due to disc bulging L5-S1: Moderate disc degeneration with right-sided spurring and right foraminal encroachment. Moderate facet hypertrophy right greater than left. S1-2: Disc degeneration and mild spurring bilaterally causing mild subarticular stenosis bilaterally. IMPRESSION: 1. Note is made of lumbarized S1 vertebral body. There is a fully developed disc space at S1-2. 2. Fractures of the transverse process of L1, L2, and L3 which appear acute. No vertebral body fracture. 3. Multilevel degenerative changes as above. Electronically Signed   By: Franchot Gallo M.D.   On: 10/21/2018 13:01      Thornton Park, MD    10/21/2018 2:18 PM

## 2018-10-21 NOTE — ED Provider Notes (Signed)
[facility] Emergency Department Provider Note  ____________________________________________   First MD Initiated Contact with Patient 10/21/18 1204     (approximate)  I have reviewed the triage vital signs and the nursing notes.   HISTORY  Chief Complaint Fall    HPI Sherri Lee is a 63 y.o. female presents emergency department after a fall last night.  She states that she fell down her steps.  She hit her head.  Some pain to her lower back.  She denies any LOC.  She denies any numbness or tingling.  Increased pain with movement.  She also has a large bruise on the left forearm.  She does not feel like her arm is broken.    Past Medical History:  Diagnosis Date  . Anemia    Low iron and hemoglobin  . Anginal pain ([place])    states she thinks it is stress  . Anxiety   . Asthma   . Depression   . Dysrhythmia    states she has PVC's  . Fibromyalgia   . Hypertension   . Recurrent vomiting 02/04/2018  . Schatzki's ring    dialated 3 times by [facility]     Patient Active Problem List   Diagnosis Date Noted  . Urge incontinence of urine 08/15/2018  . Numbness of right thumb 08/15/2018  . Abnormal thyroid blood test 02/04/2018  . Thumb laceration, right, subsequent encounter 11/04/2017  . Skin lesion of right lower extremity 11/04/2017  . Adjustment disorder with mixed disturbance of emotions and conduct 03/31/2017  . Benzodiazepine overdose 03/31/2017  . Anemia in chronic kidney disease 07/25/2016  . Esophageal stricture 06/19/2016  . IBS (irritable bowel syndrome) 09/06/2015  . IDA (iron deficiency anemia) 09/06/2015  . DDD (degenerative disc disease), cervical 09/24/2014  . DDD (degenerative disc disease), thoracic 09/24/2014  . Tremor of both hands 09/10/2014  . Insomnia due to anxiety and fear 02/26/2013  . Ovarian cyst, left 08/09/2012  . Alopecia areata 07/11/2012  . Cervicalgia 07/11/2012  . Thoracic spine pain 10/04/2011   . Essential hypertension 05/10/2011  . Prediabetes 05/10/2011  . Status post gastric bypass for obesity 05/10/2011    Past Surgical History:  Procedure Laterality Date  . ABDOMINAL ADHESION SURGERY    . BUNIONECTOMY     left foot   . CERVICAL FUSION    . CESAREAN SECTION    . CHOLECYSTECTOMY  June 2013  . GASTRIC BYPASS      Prior to Admission medications   Medication Sig Start Date End Date Taking? Authorizing Provider  acyclovir (ZOVIRAX) 400 MG tablet Take 1 tablet (400 mg total) by mouth 5 (five) times daily. 02/02/18   Crecencio Mc, MD  calcium carbonate (OS-CAL) 600 MG TABS Take 600 mg by mouth 2 (two) times daily with a meal.    [provider]  escitalopram (LEXAPRO) 20 MG tablet Take 20 mg by mouth daily.     [provider]  ferrous sulfate 325 (65 FE) MG EC tablet Take 325 mg by mouth daily.    [provider]  gabapentin (NEURONTIN) 100 MG capsule LIMIT 1-3 CAPSULES BY MOUTH PER DAY IF TOLERATED 03/21/18   Crecencio Mc, MD  lamoTRIgine (LAMICTAL) 200 MG tablet Take 200 mg by mouth daily.    [provider]  LORazepam (ATIVAN) 1 MG tablet TAKE 1 TABLET BY MOUTH TWICE A DAY AS DIRECTED FOR ANXIETY/PANIC 06/21/17   [provider]  metoprolol succinate (TOPROL-XL) 50 MG  24 hr tablet TAKE 1 TABLET BY MOUTH EVERY DAY WITH OR IMMEDIATELY FOLLOWING A MEAL 05/22/18   Sherlene Shamsullo, Teresa L, MD  Multiple Vitamin (MULTIVITAMIN) tablet Take 1 tablet by mouth daily. Bariatric Vitamin    [provider]  oxyCODONE-acetaminophen (PERCOCET) 7.5-325 MG tablet Take 1 tablet by mouth every 4 (four) hours as needed for severe pain. 10/21/18 10/21/19  Sergio Hobart, Roselyn BeringSusan W, PA-C  pantoprazole (PROTONIX) 40 MG tablet TAKE 1 TABLET BY MOUTH EVERY DAY 07/30/18   Sherlene Shamsullo, Teresa L, MD  traMADol (ULTRAM) 50 MG tablet Take 1 tablet (50 mg total) by mouth every 6 (six) hours as needed. 05/22/18   Sherlene Shamsullo, Teresa L, MD  zolpidem (AMBIEN) 10 MG tablet Take 10 mg by  mouth at bedtime as needed for sleep.    [provider]    Allergies Bacitracin-polymyxin b, Eggs or egg-derived products, and Betadine [povidone iodine]  Family History  Problem Relation Age of Onset  . Diabetes Mother   . Hypertension Mother   . Heart disease Father   . Breast cancer Maternal Aunt   . Cancer Maternal Aunt   . Colon cancer Neg Hx   . Ovarian cancer Neg Hx     Social History Social History   Tobacco Use  . Smoking status: Never Smoker  . Smokeless tobacco: Never Used  Substance Use Topics  . Alcohol use: Yes    Alcohol/week: 3.0 standard drinks    Types: 3 Glasses of wine per week    Comment: once weekly  . Drug use: No    Review of Systems  Constitutional: No fever/chills, positive for head injury Eyes: No visual changes. ENT: No sore throat. Respiratory: Denies cough Genitourinary: Negative for dysuria. Musculoskeletal: Positive for back pain. Skin: Negative for rash.    ____________________________________________   PHYSICAL EXAM:  VITAL SIGNS: ED Triage Vitals  Enc Vitals Group     BP 10/21/18 1121 139/86     Pulse Rate 10/21/18 1121 73     Resp 10/21/18 1121 16     Temp 10/21/18 1121 97.6 F (36.4 C)     Temp Source 10/21/18 1121 Oral     SpO2 10/21/18 1121 97 %     Weight 10/21/18 1120 153 lb (69.4 kg)     Height 10/21/18 1120 5\' 5"  (1.651 m)     Head Circumference --      Peak Flow --      Pain Score --      Pain Loc --      Pain Edu? --      Excl. in GC? --     Constitutional: Alert and oriented. Well appearing and in no acute distress. Eyes: Conjunctivae are normal.  Head: Swelling noted at the posterior right side of the scalp Nose: No congestion/rhinnorhea. Mouth/Throat: Mucous membranes are moist.   Neck:  supple no lymphadenopathy noted Cardiovascular: Normal rate, regular rhythm. Heart sounds are normal Respiratory: Normal respiratory effort.  No retractions, lungs c t a  Abd: soft nontender bs normal  all 4 quad GU: deferred Musculoskeletal: FROM all extremities, warm and well perfused, large bruise noted on the left forearm, C-spine and lumbar spine are tender, neurovascular is intact Neurologic:  Normal speech and language.  Skin:  Skin is warm, dry and intact. No rash noted. Psychiatric: Mood and affect are normal. Speech and behavior are normal.  ____________________________________________   LABS (all labs ordered are listed, but only abnormal results are displayed)  Labs Reviewed - No data  to display ____________________________________________   ____________________________________________  RADIOLOGY  CT of the head, C-spine, lumbar spine CT of the head and C-spine are negative CT of the lumbar spine shows transverse process fractures at L1, L2, and L3  ____________________________________________   PROCEDURES  Procedure(s) performed: Lumbar corset was fitted and applied by the by the rep  Procedures    ____________________________________________   INITIAL IMPRESSION / ASSESSMENT AND PLAN / ED COURSE  Pertinent labs & imaging results that were available during my care of the patient were reviewed by me and considered in my medical decision making (see chart for details).   Patient is a 63 year old female presents emergency department after a fall down several steps.  She is complaining of back pain, she hit her head, and her neck is hurting.  She denies any LOC.  Physical exam shows a contusion on the back of the scalp, C-spine minimally tender, lumbar spine is tender to palpation.  CT of the head and C-spine are both negative for any acute abnormalities CT of the lumbar spine shows transverse process fractures at L1, L2, and L3.  Discussed with Dr. Erma HeritageIsaacs.  He said to call orthopedics and she would most likely need a corset type support.    Discussed this with Dr. Martha ClanKrasinski.  He recommends lumbar corset.  He states refer [facility]  However the patient does have a previous surgeon for her C-spine fusion.  She would like to follow-up with him.  I agree this would be okay for her to do.    She was given pain medication while here in the ED.  She was given Percocet 7.5 as the 5.  3 to 5 days did not help with the pain very much.  She was discharged in stable condition.  She was given strict instructions to return if worsening.  And she states she will call her regular doctor tomorrow.    Sherri Lee was evaluated in Emergency Department on 10/21/2018 for the symptoms described in the history of present illness. She was evaluated in the context of the global COVID-19 pandemic, which necessitated consideration that the patient might be at risk for infection with the SARS-CoV-2 virus that causes COVID-19. Institutional protocols and algorithms that pertain to the evaluation of patients at risk for COVID-19 are in a state of rapid change based on information released by regulatory bodies including the CDC and federal and state organizations. These policies and algorithms were followed during the patient's care in the ED.   As part of my medical decision making, I reviewed the following data within the electronic MEDICAL RECORD NUMBER Nursing notes reviewed and incorporated, Old chart reviewed, Radiograph reviewed see above, A consult was requested and obtained from this/these consultant(s) Orthopedics, Notes from prior ED visits and [place] Controlled Substance Database  ____________________________________________   FINAL CLINICAL IMPRESSION(S) / ED DIAGNOSES  Final diagnoses:  Lumbar transverse process fracture, closed, initial encounter (HCC)      NEW MEDICATIONS STARTED DURING THIS VISIT:  New Prescriptions   OXYCODONE-ACETAMINOPHEN (PERCOCET) 7.5-325 MG TABLET    Take 1 tablet by mouth every 4 (four) hours as needed for severe pain.     Note:  This document was prepared using Dragon  voice recognition software and may include unintentional dictation errors.    Faythe GheeFisher, Shiana Rappleye W, PA-C 10/21/18 1630    Shaune PollackIsaacs, Cameron, MD 10/27/18 1044

## 2018-10-21 NOTE — ED Triage Notes (Signed)
Pt to ED via POV c/o fall last night. Pt states that she is having pain in her lower back. Pt states that she hit her head. Pt denies LOC, pt is not on blood thinners.

## 2018-10-21 NOTE — ED Notes (Signed)
FIRST NURSE NOTE:  Pt's mother here in the lobby to pick up patient.

## 2018-10-21 NOTE — Discharge Instructions (Signed)
Wear the corset brace unless showering.  Take pain medication as prescribed.  Return emergency department if worsening.  Follow-up with your regular orthopedic or Dr Lacinda Axon at [facility].  Apply ice to all areas that hurt

## 2018-10-25 ENCOUNTER — Other Ambulatory Visit: Payer: Self-pay | Admitting: Internal Medicine

## 2018-10-25 ENCOUNTER — Telehealth: Payer: Self-pay

## 2018-10-25 MED ORDER — OXYCODONE-ACETAMINOPHEN 7.5-325 MG PO TABS: 1.0000 | ORAL_TABLET | ORAL | 0 refills | Status: AC | PRN

## 2018-10-25 NOTE — Telephone Encounter (Signed)
Pt called to schedule an appt with you for tomorrow because she fell and broke her lumbar spine in three places. Pt stated to [place] that she was told that she would need to contact her PCP in order to get a refill on the pain medication. I tried calling pt to see if she was completely out of medication and did not get an answer on the home or cell phone number. Message was left to return our call.

## 2018-10-25 NOTE — Telephone Encounter (Signed)
Oxycodone refilled for 20 tablets

## 2018-10-25 NOTE — Telephone Encounter (Signed)
Spoke with pt to let her know that Dr. Derrel Nip has refilled the oxycodone for 20 tablets and that she will still need to keep her appt with Dr. Derrel Nip tomorrow. Pt gave a verbal understanding.

## 2018-10-26 ENCOUNTER — Ambulatory Visit (INDEPENDENT_AMBULATORY_CARE_PROVIDER_SITE_OTHER): Payer: Medicare Other | Admitting: Internal Medicine

## 2018-10-26 ENCOUNTER — Encounter: Payer: Self-pay | Admitting: Internal Medicine

## 2018-10-26 ENCOUNTER — Other Ambulatory Visit: Payer: Self-pay

## 2018-10-26 VITALS — Ht 65.0 in | Wt 153.0 lb

## 2018-10-26 DIAGNOSIS — S32009A Unspecified fracture of unspecified lumbar vertebra, initial encounter for closed fracture: Secondary | ICD-10-CM | POA: Diagnosis not present

## 2018-10-26 DIAGNOSIS — M545 Low back pain, unspecified: Secondary | ICD-10-CM

## 2018-10-26 MED ORDER — OXYCODONE-ACETAMINOPHEN 10-325 MG PO TABS: 1.0000 | ORAL_TABLET | ORAL | 0 refills | Status: AC | PRN

## 2018-10-26 MED ORDER — LIDOCAINE 5 % EX PTCH: 1.0000 | MEDICATED_PATCH | CUTANEOUS | 0 refills | Status: DC

## 2018-10-26 NOTE — Progress Notes (Signed)
Virtual Visit via [email or web address]  Note  This visit type was conducted due to national recommendations for restrictions regarding the COVID-19 pandemic (e.g. social distancing).  This format is felt to be most appropriate for this patient at this time.  All issues noted in this document were discussed and addressed.  No physical exam was performed (except for noted visual exam findings with Video Visits).   I connected with@ on 10/26/18 at  9:30 AM EDT by a video enabled telemedicine application or telephone and verified that I am speaking with the correct person using two identifiers. Location patient: home Location provider: work or home office Persons participating in the virtual visit: patient, provider  I discussed the limitations, risks, security and privacy concerns of performing an evaluation and management service by telephone and the availability of in person appointments. I also discussed with the patient that there may be a patient responsible charge related to this service. The patient expressed understanding and agreed to proceed.  Reason for visit: back pain, medication refill  HPI:  Lumbar facet fractures occurred during a fall  One week ago while getting out of her outdoor hot tub.  Went to ED on Day 2 when low back pain became unbearable ER visit records reviewed including  CT head, cervical and lumbar spines . She was given rx for #20 Vicodin and advised to follow up with Orthopedics.  She has been unable to schedule an appt with her surgeon at the [facility]  earlier than October  .  Yesterday she developed progression of pain in left lower back , yesterday starting hurting in the right anterior thigh yesterday.  She has bruises on forearms and shoulders  But none on legs.   The pain in the lower back is stabbing,  But in the legs is brought on with bending and reaching .  She denies bladder or bowel  Incontinence and no saddle parasthesias   Taking 4 advil daily.  And protonix  one daily  Taking valium  Chewable Vitamin D  2 daily not sure of dose.  Her drug abusing son is currently out of the home in a rehab center.    ROS: See pertinent positives and negatives per HPI.  Past Medical History:  Diagnosis Date  . Anemia    Low iron and hemoglobin  . Anginal pain ([place])    states she thinks it is stress  . Anxiety   . Asthma   . Depression   . Dysrhythmia    states she has PVC's  . Fibromyalgia   . Hypertension   . Recurrent vomiting 02/04/2018  . Schatzki's ring    dialated 3 times by Tiffany Kocher     Past Surgical History:  Procedure Laterality Date  . ABDOMINAL ADHESION SURGERY    . BUNIONECTOMY     left foot   . CERVICAL FUSION    . CESAREAN SECTION    . CHOLECYSTECTOMY  June 2013  . GASTRIC BYPASS      Family History  Problem Relation Age of Onset  . Diabetes Mother   . Hypertension Mother   . Heart disease Father   . Breast cancer Maternal Aunt   . Cancer Maternal Aunt   . Colon cancer Neg Hx   . Ovarian cancer Neg Hx     SOCIAL HX:  reports that she has never smoked. She has never used smokeless tobacco. She reports current alcohol use of about 3.0 standard drinks of alcohol per week. She reports  that she does not use drugs.   Current Outpatient Medications:  .  acyclovir (ZOVIRAX) 400 MG tablet, Take 1 tablet (400 mg total) by mouth 5 (five) times daily., Disp: 35 tablet, Rfl: 3 .  calcium carbonate (OS-CAL) 600 MG TABS, Take 600 mg by mouth 2 (two) times daily with a meal., Disp: , Rfl:  .  escitalopram (LEXAPRO) 20 MG tablet, Take 20 mg by mouth daily. , Disp: , Rfl:  .  ferrous sulfate 325 (65 FE) MG EC tablet, Take 325 mg by mouth daily., Disp: , Rfl:  .  gabapentin (NEURONTIN) 100 MG capsule, LIMIT 1-3 CAPSULES BY MOUTH PER DAY IF TOLERATED, Disp: 270 capsule, Rfl: 1 .  LORazepam (ATIVAN) 1 MG tablet, TAKE 1 TABLET BY MOUTH TWICE A DAY AS DIRECTED FOR ANXIETY/PANIC, Disp: , Rfl: 2 .  Multiple Vitamin (MULTIVITAMIN) tablet, Take 1  tablet by mouth daily. Bariatric Vitamin, Disp: , Rfl:  .  oxyCODONE-acetaminophen (PERCOCET) 7.5-325 MG tablet, Take 1 tablet by mouth every 4 (four) hours as needed for severe pain., Disp: 20 tablet, Rfl: 0 .  pantoprazole (PROTONIX) 40 MG tablet, TAKE 1 TABLET BY MOUTH EVERY DAY, Disp: 90 tablet, Rfl: 1 .  traMADol (ULTRAM) 50 MG tablet, Take 1 tablet (50 mg total) by mouth every 6 (six) hours as needed., Disp: 90 tablet, Rfl: 5 .  zolpidem (AMBIEN) 10 MG tablet, Take 10 mg by mouth at bedtime as needed for sleep., Disp: , Rfl:  .  lamoTRIgine (LAMICTAL) 200 MG tablet, Take 200 mg by mouth daily., Disp: , Rfl:  .  lidocaine (LIDODERM) 5 %, Place 1 patch onto the skin daily. Remove & Discard patch within 12 hours or as directed by MD, Disp: 30 patch, Rfl: 0 .  metoprolol succinate (TOPROL-XL) 50 MG 24 hr tablet, TAKE 1 TABLET BY MOUTH EVERY DAY WITH OR IMMEDIATELY FOLLOWING A MEAL, Disp: 90 tablet, Rfl: 1 .  oxyCODONE-acetaminophen (PERCOCET) 10-325 MG tablet, Take 1 tablet by mouth every 4 (four) hours as needed for up to 5 days for pain., Disp: 64 tablet, Rfl: 0  EXAM:  VITALS per patient if applicable:  GENERAL: alert, oriented, appears  to be in pain   HEENT: atraumatic, conjunttiva clear, no obvious abnormalities on inspection of external nose and ears  NECK: normal movements of the head and neck  LUNGS: on inspection no signs of respiratory distress, breathing rate appears normal, no obvious gross SOB, gasping or wheezing  CV: no obvious cyanosis  MS: moves all visible extremities without noticeable abnormality  PSYCH/NEURO: pleasant and cooperative, no obvious depression or anxiety, speech and thought processing grossly intact  ASSESSMENT AND PLAN:  Discussed the following assessment and plan:  Low back pain associated with a spinal disorder other than radiculopathy or spinal stenosis Oxycodone  refill at same dose yesterday but she is not able to seep through the night due  to persistent pain. Prescribing a  higher dose for bedtime.  Qty #20 each.  [facility] made.      I discussed the assessment and treatment plan with the patient. The patient was provided an opportunity to ask questions and all were answered. The patient agreed with the plan and demonstrated an understanding of the instructions.   The patient was advised to call back or seek an in-person evaluation if the symptoms worsen or if the condition fails to improve as anticipated.  I provided 25 minutes of non-face-to-face time during this encounter.   Rosey Batheresa  Ether Griffins, MD

## 2018-10-28 DIAGNOSIS — M545 Low back pain, unspecified: Secondary | ICD-10-CM | POA: Insufficient documentation

## 2018-10-28 NOTE — Assessment & Plan Note (Addendum)
Oxycodone  refill at same dose yesterday but she is not able to seep through the night due to persistent pain. Prescribing a  higher dose for bedtime.  Qty #20 each.  Referral to [facility] made.

## 2018-11-08 ENCOUNTER — Other Ambulatory Visit: Payer: Medicare Other

## 2018-11-09 DIAGNOSIS — M79641 Pain in right hand: Secondary | ICD-10-CM | POA: Diagnosis not present

## 2018-11-09 DIAGNOSIS — R202 Paresthesia of skin: Secondary | ICD-10-CM | POA: Diagnosis not present

## 2018-11-09 DIAGNOSIS — R200 Anesthesia of skin: Secondary | ICD-10-CM | POA: Diagnosis not present

## 2018-11-15 ENCOUNTER — Other Ambulatory Visit: Payer: Self-pay

## 2018-11-15 ENCOUNTER — Other Ambulatory Visit (INDEPENDENT_AMBULATORY_CARE_PROVIDER_SITE_OTHER): Payer: Medicare Other

## 2018-11-15 DIAGNOSIS — R3915 Urgency of urination: Secondary | ICD-10-CM

## 2018-11-15 DIAGNOSIS — R200 Anesthesia of skin: Secondary | ICD-10-CM | POA: Diagnosis not present

## 2018-11-15 DIAGNOSIS — I10 Essential (primary) hypertension: Secondary | ICD-10-CM

## 2018-11-15 DIAGNOSIS — D509 Iron deficiency anemia, unspecified: Secondary | ICD-10-CM | POA: Diagnosis not present

## 2018-11-15 LAB — CBC WITH DIFFERENTIAL/PLATELET
Basophils Absolute: 0.1 10*3/uL (ref 0.0–0.1)
Basophils Relative: 1.2 % (ref 0.0–3.0)
Eosinophils Absolute: 0.5 10*3/uL (ref 0.0–0.7)
Eosinophils Relative: 10.3 % — ABNORMAL HIGH (ref 0.0–5.0)
HCT: 36.5 % (ref 36.0–46.0)
Hemoglobin: 12 g/dL (ref 12.0–15.0)
Lymphocytes Relative: 37.4 % (ref 12.0–46.0)
Lymphs Abs: 1.9 10*3/uL (ref 0.7–4.0)
MCHC: 32.7 g/dL (ref 30.0–36.0)
MCV: 90.2 fl (ref 78.0–100.0)
Monocytes Absolute: 0.5 10*3/uL (ref 0.1–1.0)
Monocytes Relative: 9.1 % (ref 3.0–12.0)
Neutro Abs: 2.2 10*3/uL (ref 1.4–7.7)
Neutrophils Relative %: 42 % — ABNORMAL LOW (ref 43.0–77.0)
Platelets: 219 10*3/uL (ref 150.0–400.0)
RBC: 4.04 Mil/uL (ref 3.87–5.11)
RDW: 12.5 % (ref 11.5–15.5)
WBC: 5.1 10*3/uL (ref 4.0–10.5)

## 2018-11-15 LAB — COMPREHENSIVE METABOLIC PANEL
ALT: 18 U/L (ref 0–35)
AST: 24 U/L (ref 0–37)
Albumin: 3.4 g/dL — ABNORMAL LOW (ref 3.5–5.2)
Alkaline Phosphatase: 67 U/L (ref 39–117)
BUN: 11 mg/dL (ref 6–23)
CO2: 28 mEq/L (ref 19–32)
Calcium: 8.8 mg/dL (ref 8.4–10.5)
Chloride: 107 mEq/L (ref 96–112)
Creatinine, Ser: 0.64 mg/dL (ref 0.40–1.20)
GFR: 93.45 mL/min (ref >60.00)
Glucose, Bld: 93 mg/dL (ref 70–99)
Potassium: 3.7 mEq/L (ref 3.5–5.1)
Sodium: 142 mEq/L (ref 135–145)
Total Bilirubin: 0.4 mg/dL (ref 0.2–1.2)
Total Protein: 6.3 g/dL (ref 6.0–8.3)

## 2018-11-15 LAB — URINALYSIS, ROUTINE W REFLEX MICROSCOPIC
Bilirubin Urine: NEGATIVE
Nitrite: NEGATIVE
RBC / HPF: NONE SEEN (ref 0–?)
Specific Gravity, Urine: 1.02 (ref 1.000–1.030)
Total Protein, Urine: NEGATIVE
Urine Glucose: NEGATIVE
Urobilinogen, UA: 0.2 (ref 0.0–1.0)
pH: 5.5 (ref 5.0–8.0)

## 2018-11-15 LAB — IBC + FERRITIN
Ferritin: 24.8 ng/mL (ref 10.0–291.0)
Iron: 71 ug/dL (ref 42–145)
Saturation Ratios: 21.4 % (ref 20.0–50.0)
Transferrin: 237 mg/dL (ref 212.0–360.0)

## 2018-11-15 LAB — LIPID PANEL
Cholesterol: 171 mg/dL (ref 0–200)
HDL: 67.9 mg/dL (ref >39.00)
LDL Cholesterol: 93 mg/dL (ref 0–99)
NonHDL: 102.88
Total CHOL/HDL Ratio: 3
Triglycerides: 50 mg/dL (ref 0.0–149.0)
VLDL: 10 mg/dL (ref 0.0–40.0)

## 2018-11-15 LAB — VITAMIN B12: Vitamin B-12: 506 pg/mL (ref 211–911)

## 2018-11-15 NOTE — Addendum Note (Signed)
Addended by: Leeanne Rio on: 11/15/2018 12:33 PM   Modules accepted: Orders

## 2018-11-16 DIAGNOSIS — H40003 Preglaucoma, unspecified, bilateral: Secondary | ICD-10-CM | POA: Diagnosis not present

## 2018-11-22 DIAGNOSIS — S32018A Other fracture of first lumbar vertebra, initial encounter for closed fracture: Secondary | ICD-10-CM | POA: Diagnosis not present

## 2018-11-22 DIAGNOSIS — S32028A Other fracture of second lumbar vertebra, initial encounter for closed fracture: Secondary | ICD-10-CM | POA: Diagnosis not present

## 2018-11-22 DIAGNOSIS — S32038A Other fracture of third lumbar vertebra, initial encounter for closed fracture: Secondary | ICD-10-CM | POA: Diagnosis not present

## 2018-11-22 DIAGNOSIS — S32009D Unspecified fracture of unspecified lumbar vertebra, subsequent encounter for fracture with routine healing: Secondary | ICD-10-CM | POA: Diagnosis not present

## 2018-12-05 DIAGNOSIS — M79601 Pain in right arm: Secondary | ICD-10-CM | POA: Diagnosis not present

## 2018-12-05 DIAGNOSIS — R200 Anesthesia of skin: Secondary | ICD-10-CM | POA: Diagnosis not present

## 2018-12-10 ENCOUNTER — Other Ambulatory Visit: Payer: Self-pay | Admitting: Internal Medicine

## 2018-12-11 NOTE — Telephone Encounter (Signed)
Refilled: 05/22/2018 Last OV: 10/26/2018 Next OV: not scheduled

## 2018-12-12 NOTE — Telephone Encounter (Signed)
Tramadol refilled.

## 2018-12-19 ENCOUNTER — Other Ambulatory Visit: Payer: Self-pay | Admitting: Internal Medicine

## 2019-01-01 DIAGNOSIS — S32009D Unspecified fracture of unspecified lumbar vertebra, subsequent encounter for fracture with routine healing: Secondary | ICD-10-CM | POA: Diagnosis not present

## 2019-01-09 DIAGNOSIS — R202 Paresthesia of skin: Secondary | ICD-10-CM | POA: Diagnosis not present

## 2019-01-09 DIAGNOSIS — M79601 Pain in right arm: Secondary | ICD-10-CM | POA: Diagnosis not present

## 2019-01-09 DIAGNOSIS — R200 Anesthesia of skin: Secondary | ICD-10-CM | POA: Diagnosis not present

## 2019-03-05 DIAGNOSIS — Z03818 Encounter for observation for suspected exposure to other biological agents ruled out: Secondary | ICD-10-CM | POA: Diagnosis not present

## 2019-04-11 DIAGNOSIS — R42 Dizziness and giddiness: Secondary | ICD-10-CM | POA: Diagnosis not present

## 2019-04-11 DIAGNOSIS — M79641 Pain in right hand: Secondary | ICD-10-CM | POA: Diagnosis not present

## 2019-04-11 DIAGNOSIS — R200 Anesthesia of skin: Secondary | ICD-10-CM | POA: Diagnosis not present

## 2019-04-11 DIAGNOSIS — R519 Headache, unspecified: Secondary | ICD-10-CM | POA: Diagnosis not present

## 2019-04-16 DIAGNOSIS — M189 Osteoarthritis of first carpometacarpal joint, unspecified: Secondary | ICD-10-CM | POA: Diagnosis not present

## 2019-04-25 ENCOUNTER — Other Ambulatory Visit: Payer: Self-pay | Admitting: Internal Medicine

## 2019-06-05 ENCOUNTER — Telehealth: Payer: Medicare Other | Admitting: Internal Medicine

## 2019-06-19 ENCOUNTER — Telehealth: Payer: Self-pay | Admitting: Internal Medicine

## 2019-06-19 NOTE — Telephone Encounter (Signed)
No answer. Phone kept ringing. Pt due to schedule Medicare Annual Wellness Visit (AWV) either virtually or audio only.  No hx of AWV; please schedule at anytime with Denisa O'Brien-Blaney at [facility]   Pt started part b 02/2017

## 2019-07-10 DIAGNOSIS — R519 Headache, unspecified: Secondary | ICD-10-CM | POA: Diagnosis not present

## 2019-07-10 DIAGNOSIS — M79601 Pain in right arm: Secondary | ICD-10-CM | POA: Diagnosis not present

## 2019-07-10 DIAGNOSIS — M79641 Pain in right hand: Secondary | ICD-10-CM | POA: Diagnosis not present

## 2019-07-10 DIAGNOSIS — R200 Anesthesia of skin: Secondary | ICD-10-CM | POA: Diagnosis not present

## 2019-07-10 DIAGNOSIS — R42 Dizziness and giddiness: Secondary | ICD-10-CM | POA: Diagnosis not present

## 2019-07-29 ENCOUNTER — Ambulatory Visit (INDEPENDENT_AMBULATORY_CARE_PROVIDER_SITE_OTHER): Payer: Medicare Other | Admitting: Internal Medicine

## 2019-07-29 ENCOUNTER — Other Ambulatory Visit: Payer: Self-pay

## 2019-07-29 ENCOUNTER — Encounter: Payer: Self-pay | Admitting: Internal Medicine

## 2019-07-29 VITALS — BP 110/78 | HR 78 | Temp 97.5°F | Resp 15 | Ht 65.0 in | Wt 167.4 lb

## 2019-07-29 DIAGNOSIS — F4325 Adjustment disorder with mixed disturbance of emotions and conduct: Secondary | ICD-10-CM

## 2019-07-29 DIAGNOSIS — D509 Iron deficiency anemia, unspecified: Secondary | ICD-10-CM

## 2019-07-29 DIAGNOSIS — R7989 Other specified abnormal findings of blood chemistry: Secondary | ICD-10-CM

## 2019-07-29 DIAGNOSIS — R7303 Prediabetes: Secondary | ICD-10-CM

## 2019-07-29 DIAGNOSIS — R300 Dysuria: Secondary | ICD-10-CM | POA: Diagnosis not present

## 2019-07-29 DIAGNOSIS — K222 Esophageal obstruction: Secondary | ICD-10-CM

## 2019-07-29 DIAGNOSIS — M545 Low back pain, unspecified: Secondary | ICD-10-CM

## 2019-07-29 DIAGNOSIS — N189 Chronic kidney disease, unspecified: Secondary | ICD-10-CM

## 2019-07-29 DIAGNOSIS — Z0000 Encounter for general adult medical examination without abnormal findings: Secondary | ICD-10-CM | POA: Diagnosis not present

## 2019-07-29 DIAGNOSIS — Z87898 Personal history of other specified conditions: Secondary | ICD-10-CM

## 2019-07-29 DIAGNOSIS — Z1211 Encounter for screening for malignant neoplasm of colon: Secondary | ICD-10-CM

## 2019-07-29 DIAGNOSIS — M5030 Other cervical disc degeneration, unspecified cervical region: Secondary | ICD-10-CM | POA: Diagnosis not present

## 2019-07-29 DIAGNOSIS — F5105 Insomnia due to other mental disorder: Secondary | ICD-10-CM

## 2019-07-29 DIAGNOSIS — R200 Anesthesia of skin: Secondary | ICD-10-CM

## 2019-07-29 DIAGNOSIS — D631 Anemia in chronic kidney disease: Secondary | ICD-10-CM

## 2019-07-29 DIAGNOSIS — Z9884 Bariatric surgery status: Secondary | ICD-10-CM

## 2019-07-29 DIAGNOSIS — F409 Phobic anxiety disorder, unspecified: Secondary | ICD-10-CM

## 2019-07-29 DIAGNOSIS — I10 Essential (primary) hypertension: Secondary | ICD-10-CM

## 2019-07-29 LAB — IRON,TIBC AND FERRITIN PANEL
%SAT: 15 % (calc) — ABNORMAL LOW (ref 16–45)
Ferritin: 16 ng/mL (ref 16–288)
Iron: 50 ug/dL (ref 45–160)
TIBC: 332 mcg/dL (calc) (ref 250–450)

## 2019-07-29 MED ORDER — PANTOPRAZOLE SODIUM 40 MG PO TBEC: 40.0000 mg | DELAYED_RELEASE_TABLET | Freq: Every day | ORAL | 3 refills | Status: DC

## 2019-07-29 NOTE — Assessment & Plan Note (Addendum)
Normal iron stores by Sept 2020 labs.  Repeat assessment due

## 2019-07-29 NOTE — Patient Instructions (Addendum)
The burning sensation may be due to "atrophic vaginitis."  IF the urine culture is negative for infection, I recommend asking for vaginal estrogen from your gynecologist   Get back to walking daily for exercise!  It will help you lose weight    Health Maintenance for Postmenopausal Women Menopause is a normal process in which your ability to get pregnant comes to an end. This process happens slowly over many months or years, usually between the ages of 49 and 71. Menopause is complete when you have missed your menstrual periods for 12 months. It is important to talk with your health care provider about some of the most common conditions that affect women after menopause (postmenopausal women). These include heart disease, cancer, and bone loss (osteoporosis). Adopting a healthy lifestyle and getting preventive care can help to promote your health and wellness. The actions you take can also lower your chances of developing some of these common conditions. What should I know about menopause? During menopause, you may get a number of symptoms, such as:  Hot flashes. These can be moderate or severe.  Night sweats.  Decrease in sex drive.  Mood swings.  Headaches.  Tiredness.  Irritability.  Memory problems.  Insomnia. Choosing to treat or not to treat these symptoms is a decision that you make with your health care provider. Do I need hormone replacement therapy?  Hormone replacement therapy is effective in treating symptoms that are caused by menopause, such as hot flashes and night sweats.  Hormone replacement carries certain risks, especially as you become older. If you are thinking about using estrogen or estrogen with progestin, discuss the benefits and risks with your health care provider. What is my risk for heart disease and stroke? The risk of heart disease, heart attack, and stroke increases as you age. One of the causes may be a change in the body's hormones during  menopause. This can affect how your body uses dietary fats, triglycerides, and cholesterol. Heart attack and stroke are medical emergencies. There are many things that you can do to help prevent heart disease and stroke. Watch your blood pressure  High blood pressure causes heart disease and increases the risk of stroke. This is more likely to develop in people who have high blood pressure readings, are of African descent, or are overweight.  Have your blood pressure checked: ? Every 3-5 years if you are 61-73 years of age. ? Every year if you are 30 years old or older. Eat a healthy diet   Eat a diet that includes plenty of vegetables, fruits, low-fat dairy products, and lean protein.  Do not eat a lot of foods that are high in solid fats, added sugars, or sodium. Get regular exercise Get regular exercise. This is one of the most important things you can do for your health. Most adults should:  Try to exercise for at least 150 minutes each week. The exercise should increase your heart rate and make you sweat (moderate-intensity exercise).  Try to do strengthening exercises at least twice each week. Do these in addition to the moderate-intensity exercise.  Spend less time sitting. Even light physical activity can be beneficial. Other tips  Work with your health care provider to achieve or maintain a healthy weight.  Do not use any products that contain nicotine or tobacco, such as cigarettes, e-cigarettes, and chewing tobacco. If you need help quitting, ask your health care provider.  Know your numbers. Ask your health care provider to check your cholesterol  and your blood sugar (glucose). Continue to have your blood tested as directed by your health care provider. Do I need screening for cancer? Depending on your health history and family history, you may need to have cancer screening at different stages of your life. This may include screening for:  Breast cancer.  Cervical  cancer.  Lung cancer.  Colorectal cancer. What is my risk for osteoporosis? After menopause, you may be at increased risk for osteoporosis. Osteoporosis is a condition in which bone destruction happens more quickly than new bone creation. To help prevent osteoporosis or the bone fractures that can happen because of osteoporosis, you may take the following actions:  If you are 36-12 years old, get at least 1,000 mg of calcium and at least 600 mg of vitamin D per day.  If you are older than age 41 but younger than age 109, get at least 1,200 mg of calcium and at least 600 mg of vitamin D per day.  If you are older than age 79, get at least 1,200 mg of calcium and at least 800 mg of vitamin D per day. Smoking and drinking excessive alcohol increase the risk of osteoporosis. Eat foods that are rich in calcium and vitamin D, and do weight-bearing exercises several times each week as directed by your health care provider. How does menopause affect my mental health? Depression may occur at any age, but it is more common as you become older. Common symptoms of depression include:  Low or sad mood.  Changes in sleep patterns.  Changes in appetite or eating patterns.  Feeling an overall lack of motivation or enjoyment of activities that you previously enjoyed.  Frequent crying spells. Talk with your health care provider if you think that you are experiencing depression. General instructions See your health care provider for regular wellness exams and vaccines. This may include:  Scheduling regular health, dental, and eye exams.  Getting and maintaining your vaccines. These include: ? Influenza vaccine. Get this vaccine each year before the flu season begins. ? Pneumonia vaccine. ? Shingles vaccine. ? Tetanus, diphtheria, and pertussis (Tdap) booster vaccine. Your health care provider may also recommend other immunizations. Tell your health care provider if you have ever been abused or do  not feel safe at home. Summary  Menopause is a normal process in which your ability to get pregnant comes to an end.  This condition causes hot flashes, night sweats, decreased interest in sex, mood swings, headaches, or lack of sleep.  Treatment for this condition may include hormone replacement therapy.  Take actions to keep yourself healthy, including exercising regularly, eating a healthy diet, watching your weight, and checking your blood pressure and blood sugar levels.  Get screened for cancer and depression. Make sure that you are up to date with all your vaccines. This information is not intended to replace advice given to you by your health care provider. Make sure you discuss any questions you have with your health care provider. Document Revised: 02/21/2018 Document Reviewed: 02/21/2018 Elsevier Patient Education  2020 [employer].

## 2019-07-29 NOTE — Assessment & Plan Note (Signed)
Referred to neurology. Last visit with [facility] April 28:  He recommended  that she return to Dr. Stephenie Acres to reports that your relief from injections wore off and discuss other treatment options. - Increase gabapentin to 100 mg in the morning and afternoon as needed, and 300 mg at night. - Take Tramadol 50 mg 1-2 times per day, refilled for 3 months. - Increase nortriptyline to 30 mg nightly

## 2019-07-29 NOTE — Progress Notes (Signed)
Patient ID: Sherri Lee, female    DOB: 1955-10-13  Age: 64 y.o. MRN: [id number]  The patient is here for annual preventive examination and management of other chronic and acute problems.  This visit occurred during the SARS-CoV-2 public health emergency.  Safety protocols were in place, including screening questions prior to the visit, additional usage of staff PPE, and extensive cleaning of exam room while observing appropriate contact time as indicated for disinfecting solutions.    Patient has received both doses of the available COVID 19 vaccine without complications.  Patient continues to mask when outside of the home except when walking in yard or at safe distances from others .  Patient denies any change in mood or development of unhealthy behaviors resuting from the pandemic's restriction of activities and socialization.    The risk factors are reflected in the social history.   The roster of all physicians providing medical care to patient - is listed in the Snapshot section of the chart.   Lee and breast exam Sherri Lee  IS OVERDUE FOR BOTH  Last colonoscopy 2011 Last mammogram 2018?   Activities of daily living:  The patient is 100% independent in all ADLs: dressing, toileting, feeding as well as independent mobility  Home safety : The patient has smoke detectors in the home. They wear seatbelts.  There are no firearms at home. There is no violence in the home.   There is no risks for hepatitis, STDs or HIV. There is no   history of blood transfusion. They have no travel history to infectious disease endemic areas of the world.  The patient has seen not seen her dentist in the last six month. They have seen their eye doctor in the last year. She denies  slight hearing difficulty with regard to whispered voices and some television programs. .  She does not  have excessive sun exposure. Discussed the need for sun protection: hats, long sleeves and use of  sunscreen if there is significant sun exposure.   Diet: the importance of a healthy diet is discussed. She has been stress eating  Not exercising .  She has gained weight .   Depression screen: there are no signs or vegative symptoms of  Untreated depression- irritability, change in appetite, anhedonia, sadness/tearfullness.   The following portions of the patient's history were reviewed and updated as appropriate: allergies, current medications, past family history, past medical history,  past surgical history, past social history  and problem list.  Visual acuity was not assessed per patient preference since she has regular follow up with her ophthalmologist. Hearing and body mass index were assessed and reviewed.   During the course of the visit the patient was educated and counseled about appropriate screening and preventive services including : fall prevention , diabetes screening, nutrition counseling, colorectal cancer screening, and recommended immunizations.    CC: The primary encounter diagnosis was Prediabetes. Diagnoses of DDD (degenerative disc disease), cervical, Iron deficiency anemia, unspecified iron deficiency anemia type, Abnormal thyroid blood test, Dysuria, Esophageal stricture, Adjustment disorder with mixed disturbance of emotions and conduct, Anemia in chronic kidney disease, unspecified CKD stage, History of benzodiazepine use, Insomnia due to anxiety and fear, Low back pain associated with a spinal disorder other than radiculopathy or spinal stenosis, Numbness of right thumb, Essential hypertension, Status post gastric bypass for obesity, Encounter for preventive health examination, and Colon cancer screening were also pertinent to this visit.   1) GAD: taking lorazepam and ambien prescribed  by  Sherri Lee.  Things are less stressful since her son  has been drug free for one year and living and working at [employer].  Parents are both alive but divorced and quite old   . Her step mother died of COVID in a nursing home .  She Stays with mother several nights per week .  Father had a stroke and has eturned home against medical advice so her sister and she take turns staying with him.   2) Right arm radiculopathy and  pain in right Lee;  The Lee pain radiates from thumb to wrist  Since her  laceration injury which caused ligament damage and OA . Saw Sherri Lee,    Gabapentin and tramadol,  Nortriptyline.  Sherri Lee, and prescribed brace . Sherri Lee has refilled her tramadol. Loss of some function is permanent per Lee specialist .   3) overweight :  Has had a 20 lb weight gain since bariatric surgery, 14 of which has been gained  since August 2020, her last visit .  She questions if the nortriptyline could be causing it.  Reviewed her dietary habits and exercise habits.  Too many sweets.  Not exercising .  Has been afraid to since COVID epidemic.  Has been procrastinating about joining a gym.  Recommended acitiviteis that do not require a gym, including walking daily. Marland Kitchen   4) DYSPHAGIA AND chest pain during meal with excessive belching.  New onset ,  But has a History of  esophageal stricture ,  Used to take pantoprazole per Sherri Lee but has been out for a few months .  Has reflux symptoms.   5) possible bladder infection?  Has urgency,  Urethral irritation   History Sherri has a past medical history of Anemia, Anginal pain (HCC), Anxiety, Asthma, Depression, Dysrhythmia, Fibromyalgia, Hypertension, Recurrent vomiting (02/04/2018), and Schatzki's ring.   She has a past surgical history that includes Bunionectomy; Cesarean section; Cholecystectomy (June 2013); Cervical fusion; Gastric bypass; and Abdominal adhesion surgery.   Her family history includes Breast cancer in her maternal aunt; Cancer in her maternal aunt; Diabetes in her mother; Heart disease in her father; Hypertension in her mother.She reports that she has never smoked. She has never  used smokeless tobacco. She reports current alcohol use of about 3.0 standard drinks of alcohol per week. She reports that she does not use drugs.  Outpatient Medications Prior to Visit  Medication Sig Dispense Refill  . acyclovir (ZOVIRAX) 400 MG tablet Take 1 tablet (400 mg total) by mouth 5 (five) times daily. 35 tablet 3  . calcium carbonate (OS-CAL) 600 MG TABS Take 600 mg by mouth 2 (two) times daily with a meal.    . escitalopram (LEXAPRO) 20 MG tablet Take 20 mg by mouth daily.     . ferrous sulfate 325 (65 FE) MG EC tablet Take 325 mg by mouth daily.    Marland Kitchen gabapentin (NEURONTIN) 100 MG capsule LIMIT 1-3 CAPSULES BY MOUTH PER DAY IF TOLERATED 270 capsule 1  . lidocaine (LIDODERM) 5 % Place 1 patch onto the skin daily. Remove & Discard patch within 12 hours or as directed by MD 30 patch 0  . LORazepam (ATIVAN) 1 MG tablet TAKE 1 TABLET BY MOUTH TWICE A DAY AS DIRECTED FOR ANXIETY/PANIC  2  . metoprolol succinate (TOPROL-XL) 50 MG 24 hr tablet TAKE 1 TABLET BY MOUTH EVERY DAY WITH OR IMMEDIATELY FOLLOWING A MEAL 90 tablet 1  . Multiple Vitamin (MULTIVITAMIN) tablet  Take 1 tablet by mouth daily. Bariatric Vitamin    . oxyCODONE-acetaminophen (PERCOCET) 7.5-325 MG tablet Take 1 tablet by mouth every 4 (four) hours as needed for severe pain. 20 tablet 0  . pantoprazole (PROTONIX) 40 MG tablet TAKE 1 TABLET BY MOUTH EVERY DAY 90 tablet 1  . traMADol (ULTRAM) 50 MG tablet TAKE 1 TABLET (50 MG TOTAL) BY MOUTH EVERY 6 (SIX) HOURS AS NEEDED. 90 tablet 5  . zolpidem (AMBIEN) 10 MG tablet Take 10 mg by mouth at bedtime as needed for sleep.    Marland Kitchen lamoTRIgine (LAMICTAL) 200 MG tablet Take 200 mg by mouth daily.     No facility-administered medications prior to visit.    Review of Systems  Patient denies headache, fevers, malaise, unintentional weight loss, skin rash, eye pain, sinus congestion and sinus pain, sore throat, dysphagia,  hemoptysis , cough, dyspnea, wheezing, chest pain, palpitations,  orthopnea, edema, abdominal pain, nausea, melena, diarrhea, constipation, flank pain, dysuria, hematuria, urinary  Frequency, nocturia, numbness, tingling, seizures,  Focal weakness, Loss of consciousness,  Tremor, insomnia, depression, anxiety, and suicidal ideation.     Objective:  BP 110/78 (BP Location: Left Arm, Patient Position: Sitting, Cuff Size: Normal)   Pulse 78   Temp (!) 97.5 F (36.4 C) (Temporal)   Resp 15   Ht 5\' 5"  (1.651 m)   Wt 167 lb 6.4 oz (75.9 kg)   SpO2 99%   BMI 27.86 kg/m   Physical Exam   General appearance: alert, cooperative and appears stated age Ears: normal TM's and external ear canals both ears Throat: lips, mucosa, and tongue normal; teeth and gums normal Neck: no adenopathy, no carotid bruit, supple, symmetrical, trachea midline and thyroid not enlarged, symmetric, no tenderness/mass/nodules Back: symmetric, no curvature. ROM normal. No CVA tenderness. Lungs: clear to auscultation bilaterally Heart: regular rate and rhythm, S1, S2 normal, no murmur, click, rub or gallop Abdomen: soft, non-tender; bowel sounds normal; no masses,  no organomegaly Pulses: 2+ and symmetric Skin: Skin color, texture, turgor normal. No rashes or lesions Lymph nodes: Cervical, supraclavicular, and axillary nodes normal. Psych: affect normal, makes good eye contact. No fidgeting,  Smiles easily.  Denies suicidal thoughts    Assessment & Plan:   Problem List Items Addressed This Visit      Unprioritized   Abnormal thyroid blood test   Relevant Orders   TSH   Adjustment disorder with mixed disturbance of emotions and conduct    Previously attributed to her  Son's ongoing  drug abuse and recurrent arrests.  Symptoms are currently controlled since son has been drug free for over a year.  Aging parents now cited as source of anxiety.  No medication changes today . counselling given.       Anemia in chronic kidney disease    resolved by Sept 2020 labs.  Repeat  assessment pending        DDD (degenerative disc disease), cervical    Referred to neurology. Last visit with [facility] April 28:  He recommended  that she return to Dr. April 30 to reports that your relief from injections wore off and discuss other treatment options. - Increase gabapentin to 100 mg in the morning and afternoon as needed, and 300 mg at night. - Take Tramadol 50 mg 1-2 times per day, refilled for 3 months. - Increase nortriptyline to 30 mg nightly       Dysuria    .ruling UTI.  If negative , recommend vaginal estrogen trial through Lee  Relevant Orders   Urinalysis, Routine w reflex microscopic   Urine Culture   Ambulatory referral to Gastroenterology   Encounter for preventive health examination    age appropriate education and counseling updated, referrals for preventative services and immunizations addressed, dietary and smoking counseling addressed, most recent labs reviewed.  I have personally reviewed and have noted:  1) the patient's medical and social history 2) The pt's use of alcohol, tobacco, and illicit drugs 3) The patient's current medications and supplements 4) Functional ability including ADL's, fall risk, home safety risk, hearing and visual impairment 5) Diet and physical activities 6) Evidence for depression or mood disorder 7) The patient's height, weight, and BMI have been recorded in the chart  I have made referrals, and provided counseling and education based on review of the above      Esophageal stricture    Symptoms of dysphagia have returned since she has been nonadherent to PPI therapy since 2019 despite advice.    Referral to Dr Servando Snare for EGD in progress. PPI refilled.       Relevant Orders   Ambulatory referral to Gastroenterology   Essential hypertension    Well controlled on current regimen of metoprolol.        History of benzodiazepine use    With history of overdose requiring hospitalization in 2019.  She is now under the  care of a psychiatrist for all controlled substance prescriptions      IDA (iron deficiency anemia)    Normal iron stores by Sept 2020 labs.  Repeat assessment due       Relevant Orders   CBC with Differential/Platelet   Iron, TIBC and Ferritin Panel (Completed)   Insomnia due to anxiety and fear    Managed by psychiatry since her overdose in 2019      Low back pain associated with a spinal disorder other than radiculopathy or spinal stenosis     Secondary to history of traumatic fractures of the transverse processes  L1-L3, during a slip and fall, complicated by moderate lumbar stenosis at L4-5.  Referral to [facility] was made last year  She saw Elson Clan and his PA an was given a back brace to wear.  NO ESI and NO PT>  .Marland Kitchen   Avoiding prescription of narcotics.  Neurology prescribing tramadol, gabapentin and nortriptyline for other pain complaints.        Numbness of right thumb    Accompanied by constant pain, Secondary to laceration injury complicated by lack of immediate treatment resulting in permanent damage (per patient).  EMB studies Sept 2020 were normal per Sherri Lee,  She was referred to Lee specialist at [facility] , Dr Sherri Lee .      Prediabetes - Primary   Relevant Orders   Comprehensive metabolic panel   Hemoglobin A1c   Lipid panel   Status post gastric bypass for obesity    She has regained 20 lbs due to lack of adherence to lifestyle changes, including dietary restraint and regular participation in exercise.  counselling given today        Other Visit Diagnoses    Colon cancer screening       Relevant Orders   Ambulatory referral to Gastroenterology      I have discontinued Sherri Lee's lamoTRIgine. I am also having her start on pantoprazole. Additionally, I am having her maintain her calcium carbonate, multivitamin, escitalopram, ferrous sulfate, zolpidem, LORazepam, acyclovir, pantoprazole, oxyCODONE-acetaminophen, lidocaine, traMADol,  gabapentin, and metoprolol succinate.  Meds ordered this encounter  Medications  . pantoprazole (PROTONIX) 40 MG tablet    Sig: Take 1 tablet (40 mg total) by mouth daily.    Dispense:  30 tablet    Refill:  3    Medications Discontinued During This Encounter  Medication Reason  . lamoTRIgine (LAMICTAL) 200 MG tablet Patient has not taken in last 30 days    Follow-up: No follow-ups on file.   Crecencio Mc, MD

## 2019-07-30 DIAGNOSIS — Z1211 Encounter for screening for malignant neoplasm of colon: Secondary | ICD-10-CM | POA: Insufficient documentation

## 2019-07-30 DIAGNOSIS — Z0000 Encounter for general adult medical examination without abnormal findings: Secondary | ICD-10-CM | POA: Insufficient documentation

## 2019-07-30 DIAGNOSIS — R300 Dysuria: Secondary | ICD-10-CM | POA: Insufficient documentation

## 2019-07-30 LAB — COMPREHENSIVE METABOLIC PANEL
ALT: 16 U/L (ref 0–35)
AST: 22 U/L (ref 0–37)
Albumin: 3.6 g/dL (ref 3.5–5.2)
Alkaline Phosphatase: 71 U/L (ref 39–117)
BUN: 9 mg/dL (ref 6–23)
CO2: 27 mEq/L (ref 19–32)
Calcium: 8.4 mg/dL (ref 8.4–10.5)
Chloride: 107 mEq/L (ref 96–112)
Creatinine, Ser: 0.68 mg/dL (ref 0.40–1.20)
GFR: 86.95 mL/min (ref >60.00)
Glucose, Bld: 89 mg/dL (ref 70–99)
Potassium: 3.5 mEq/L (ref 3.5–5.1)
Sodium: 138 mEq/L (ref 135–145)
Total Bilirubin: 0.3 mg/dL (ref 0.2–1.2)
Total Protein: 6.4 g/dL (ref 6.0–8.3)

## 2019-07-30 LAB — LIPID PANEL
Cholesterol: 193 mg/dL (ref 0–200)
HDL: 68.4 mg/dL (ref >39.00)
LDL Cholesterol: 109 mg/dL — ABNORMAL HIGH (ref 0–99)
NonHDL: 124.47
Total CHOL/HDL Ratio: 3
Triglycerides: 79 mg/dL (ref 0.0–149.0)
VLDL: 15.8 mg/dL (ref 0.0–40.0)

## 2019-07-30 LAB — URINALYSIS, ROUTINE W REFLEX MICROSCOPIC
Bilirubin Urine: NEGATIVE
Ketones, ur: NEGATIVE
Leukocytes,Ua: NEGATIVE
Nitrite: NEGATIVE
RBC / HPF: NONE SEEN (ref 0–?)
Specific Gravity, Urine: 1.025 (ref 1.000–1.030)
Total Protein, Urine: NEGATIVE
Urine Glucose: NEGATIVE
Urobilinogen, UA: 0.2 (ref 0.0–1.0)
pH: 6 (ref 5.0–8.0)

## 2019-07-30 LAB — CBC WITH DIFFERENTIAL/PLATELET
Basophils Absolute: 0 10*3/uL (ref 0.0–0.1)
Basophils Relative: 0.6 % (ref 0.0–3.0)
Eosinophils Absolute: 0.3 10*3/uL (ref 0.0–0.7)
Eosinophils Relative: 5.7 % — ABNORMAL HIGH (ref 0.0–5.0)
HCT: 35.8 % — ABNORMAL LOW (ref 36.0–46.0)
Hemoglobin: 11.9 g/dL — ABNORMAL LOW (ref 12.0–15.0)
Lymphocytes Relative: 30.3 % (ref 12.0–46.0)
Lymphs Abs: 1.4 10*3/uL (ref 0.7–4.0)
MCHC: 33.1 g/dL (ref 30.0–36.0)
MCV: 89.6 fl (ref 78.0–100.0)
Monocytes Absolute: 0.4 10*3/uL (ref 0.1–1.0)
Monocytes Relative: 8 % (ref 3.0–12.0)
Neutro Abs: 2.6 10*3/uL (ref 1.4–7.7)
Neutrophils Relative %: 55.4 % (ref 43.0–77.0)
Platelets: 184 10*3/uL (ref 150.0–400.0)
RBC: 3.99 Mil/uL (ref 3.87–5.11)
RDW: 12.7 % (ref 11.5–15.5)
WBC: 4.7 10*3/uL (ref 4.0–10.5)

## 2019-07-30 LAB — HEMOGLOBIN A1C: Hgb A1c MFr Bld: 5.7 % (ref 4.6–6.5)

## 2019-07-30 NOTE — Assessment & Plan Note (Signed)
Well controlled on current regimen of metoprolol.  

## 2019-07-30 NOTE — Assessment & Plan Note (Signed)
Previously attributed to her  Son's ongoing  drug abuse and recurrent arrests.  Symptoms are currently controlled since son has been drug free for over a year.  Aging parents now cited as source of anxiety.  No medication changes today . counselling given.

## 2019-07-30 NOTE — Assessment & Plan Note (Signed)
resolved by Sept 2020 labs.  Repeat assessment pending

## 2019-07-30 NOTE — Assessment & Plan Note (Addendum)
Accompanied by constant pain, Secondary to laceration injury complicated by lack of immediate treatment resulting in permanent damage (per patient).  EMB studies Sept 2020 were normal per Malvin Johns,  She was referred to hand specialist at [facility] , Dr Stephenie Acres .

## 2019-07-30 NOTE — Assessment & Plan Note (Signed)

## 2019-07-30 NOTE — Assessment & Plan Note (Signed)
With history of overdose requiring hospitalization in 2019.  She is now under the care of a psychiatrist for all controlled substance prescriptions

## 2019-07-30 NOTE — Assessment & Plan Note (Signed)
Managed by psychiatry since her overdose in 2019 

## 2019-07-30 NOTE — Assessment & Plan Note (Signed)
.  ruling UTI.  If negative , recommend vaginal estrogen trial through GYN

## 2019-07-30 NOTE — Assessment & Plan Note (Addendum)
Symptoms of dysphagia have returned since she has been nonadherent to PPI therapy since 2019 despite advice.    Referral to Dr Servando Snare for EGD in progress. PPI refilled.

## 2019-07-30 NOTE — Assessment & Plan Note (Addendum)
Secondary to history of traumatic fractures of the transverse processes  L1-L3, during a slip and fall, complicated by moderate lumbar stenosis at L4-5.  Referral to [facility] was made last year  She saw Elson Clan and his PA an was given a back brace to wear.  NO ESI and NO PT>  .Marland Kitchen   Avoiding prescription of narcotics.  Neurology prescribing tramadol, gabapentin and nortriptyline for other pain complaints.

## 2019-07-30 NOTE — Assessment & Plan Note (Signed)
She has regained 20 lbs due to lack of adherence to lifestyle changes, including dietary restraint and regular participation in exercise.  counselling given today

## 2019-07-31 LAB — TSH: TSH: 0.59 u[IU]/mL (ref 0.35–4.50)

## 2019-07-31 LAB — URINE CULTURE: Organism ID, Bacteria: NO GROWTH

## 2019-09-23 ENCOUNTER — Ambulatory Visit: Payer: Medicare Other | Admitting: Gastroenterology

## 2019-09-24 DIAGNOSIS — J029 Acute pharyngitis, unspecified: Secondary | ICD-10-CM | POA: Diagnosis not present

## 2019-09-24 DIAGNOSIS — J019 Acute sinusitis, unspecified: Secondary | ICD-10-CM | POA: Diagnosis not present

## 2019-09-24 DIAGNOSIS — Z03818 Encounter for observation for suspected exposure to other biological agents ruled out: Secondary | ICD-10-CM | POA: Diagnosis not present

## 2019-09-25 ENCOUNTER — Telehealth: Payer: Medicare Other | Admitting: Internal Medicine

## 2019-10-10 ENCOUNTER — Telehealth: Payer: Self-pay | Admitting: Internal Medicine

## 2019-10-10 DIAGNOSIS — Z03818 Encounter for observation for suspected exposure to other biological agents ruled out: Secondary | ICD-10-CM | POA: Diagnosis not present

## 2019-10-10 DIAGNOSIS — J209 Acute bronchitis, unspecified: Secondary | ICD-10-CM | POA: Diagnosis not present

## 2019-10-10 DIAGNOSIS — Z20822 Contact with and (suspected) exposure to covid-19: Secondary | ICD-10-CM | POA: Diagnosis not present

## 2019-10-10 NOTE — Telephone Encounter (Signed)
Rejection Reason - Patient did not respond - I have been unable to reach this patient by phone. A letter is being sent. Per Radene Knee 09/24/2019" [facility] said about 17 hours ago

## 2019-10-23 ENCOUNTER — Other Ambulatory Visit: Payer: Self-pay | Admitting: Internal Medicine

## 2019-10-30 DIAGNOSIS — R519 Headache, unspecified: Secondary | ICD-10-CM | POA: Diagnosis not present

## 2019-10-30 DIAGNOSIS — R202 Paresthesia of skin: Secondary | ICD-10-CM | POA: Diagnosis not present

## 2019-10-30 DIAGNOSIS — M79601 Pain in right arm: Secondary | ICD-10-CM | POA: Diagnosis not present

## 2019-10-30 DIAGNOSIS — R42 Dizziness and giddiness: Secondary | ICD-10-CM | POA: Diagnosis not present

## 2019-10-30 DIAGNOSIS — R200 Anesthesia of skin: Secondary | ICD-10-CM | POA: Diagnosis not present

## 2019-11-02 ENCOUNTER — Other Ambulatory Visit: Payer: Self-pay | Admitting: Internal Medicine

## 2019-11-04 ENCOUNTER — Other Ambulatory Visit: Payer: Self-pay | Admitting: Obstetrics and Gynecology

## 2019-11-04 DIAGNOSIS — Z1231 Encounter for screening mammogram for malignant neoplasm of breast: Secondary | ICD-10-CM

## 2019-11-04 DIAGNOSIS — Z1239 Encounter for other screening for malignant neoplasm of breast: Secondary | ICD-10-CM | POA: Diagnosis not present

## 2019-11-04 DIAGNOSIS — Z124 Encounter for screening for malignant neoplasm of cervix: Secondary | ICD-10-CM | POA: Diagnosis not present

## 2019-11-27 DIAGNOSIS — Z1231 Encounter for screening mammogram for malignant neoplasm of breast: Secondary | ICD-10-CM | POA: Diagnosis not present

## 2020-01-07 ENCOUNTER — Ambulatory Visit: Payer: Medicare Other | Admitting: Nurse Practitioner

## 2020-01-10 ENCOUNTER — Encounter: Payer: Self-pay | Admitting: Nurse Practitioner

## 2020-01-10 ENCOUNTER — Other Ambulatory Visit: Payer: Self-pay

## 2020-01-10 ENCOUNTER — Ambulatory Visit (INDEPENDENT_AMBULATORY_CARE_PROVIDER_SITE_OTHER): Payer: Medicare Other | Admitting: Nurse Practitioner

## 2020-01-10 VITALS — BP 132/84 | HR 80 | Temp 98.3°F | Ht 65.0 in | Wt 169.0 lb

## 2020-01-10 DIAGNOSIS — M545 Low back pain, unspecified: Secondary | ICD-10-CM

## 2020-01-10 MED ORDER — MELOXICAM 7.5 MG PO TABS: 7.5000 mg | ORAL_TABLET | Freq: Every day | ORAL | 0 refills | Status: AC

## 2020-01-10 MED ORDER — CYCLOBENZAPRINE HCL 5 MG PO TABS: 5.0000 mg | ORAL_TABLET | Freq: Two times a day (BID) | ORAL | 0 refills | Status: AC | PRN

## 2020-01-10 NOTE — Progress Notes (Signed)
Established Patient Office Visit  Subjective:  Patient ID: Sherri Lee, female    DOB: 02/14/1956  Age: 64 y.o. MRN: [id number]  CC:  Chief Complaint  Patient presents with  . Acute Visit    back pain    HPI Sherri Lee presents for right lower back pain onset last Thursday.  She had been moving furniture and lifted a heavy piece that required her to walk with it for short distance.  It was after this that she noticed the back pain.  She has treated this with extra strength Tylenol 2 tablets 3 times a day and it helps a little bit.  She has been using a pain patch.  It hurts worse if she bends over or twist from side to side.  She has continued to clean her house, closets, as its been listed to go on sale. She continues to work despite back pain.  No pain down her right leg, numbness tingling or weakness.  She did get in a hot tub last Sunday, had to go up and down steps and that aggravated her back.  She had leftover tramadol from another injury and took that for a few days.  It seemed to help.  Past Medical History:  Diagnosis Date  . Anemia    Low iron and hemoglobin  . Anginal pain (HCC)    states she thinks it is stress  . Anxiety   . Asthma   . Depression   . Dysrhythmia    states she has PVC's  . Fibromyalgia   . Hypertension   . Recurrent vomiting 02/04/2018  . Schatzki's ring    dialated 3 times by Markham Jordan     Past Surgical History:  Procedure Laterality Date  . ABDOMINAL ADHESION SURGERY    . BUNIONECTOMY     left foot   . CERVICAL FUSION    . CESAREAN SECTION    . CHOLECYSTECTOMY  June 2013  . GASTRIC BYPASS      Family History  Problem Relation Age of Onset  . Diabetes Mother   . Hypertension Mother   . Heart disease Father   . Breast cancer Maternal Aunt   . Cancer Maternal Aunt   . Colon cancer Neg Hx   . Ovarian cancer Neg Hx     Social History   Socioeconomic History  . Marital status: Divorced    Spouse name:  Not on file  . Number of children: Not on file  . Years of education: Not on file  . Highest education level: Not on file  Occupational History  . Not on file  Tobacco Use  . Smoking status: Never Smoker  . Smokeless tobacco: Never Used  Substance and Sexual Activity  . Alcohol use: Yes    Alcohol/week: 3.0 standard drinks    Types: 3 Glasses of wine per week    Comment: once weekly  . Drug use: No  . Sexual activity: Yes  Other Topics Concern  . Not on file  Social History Narrative   Lives with spouse, son, and stepson who has serious psychiatric issues and alcohol abuse   Social Determinants of Health   Financial Resource Strain:   . Difficulty of Paying Living Expenses: Not on file  Food Insecurity:   . Worried About Programme researcher, broadcasting/film/video in the Last Year: Not on file  . Ran Out of Food in the Last Year: Not on file  Transportation Needs:   . Lack of Transportation (  Medical): Not on file  . Lack of Transportation (Non-Medical): Not on file  Physical Activity:   . Days of Exercise per Week: Not on file  . Minutes of Exercise per Session: Not on file  Stress:   . Feeling of Stress : Not on file  Social Connections:   . Frequency of Communication with Friends and Family: Not on file  . Frequency of Social Gatherings with Friends and Family: Not on file  . Attends Religious Services: Not on file  . Active Member of Clubs or Organizations: Not on file  . Attends Banker Meetings: Not on file  . Marital Status: Not on file  Intimate Partner Violence:   . Fear of Current or Ex-Partner: Not on file  . Emotionally Abused: Not on file  . Physically Abused: Not on file  . Sexually Abused: Not on file    Outpatient Medications Prior to Visit  Medication Sig Dispense Refill  . acyclovir (ZOVIRAX) 400 MG tablet Take 1 tablet (400 mg total) by mouth 5 (five) times daily. 35 tablet 3  . calcium carbonate (OS-CAL) 600 MG TABS Take 600 mg by mouth 2 (two) times daily  with a meal.    . escitalopram (LEXAPRO) 20 MG tablet Take 20 mg by mouth daily.     Marland Kitchen gabapentin (NEURONTIN) 100 MG capsule LIMIT 1-3 CAPSULES BY MOUTH PER DAY IF TOLERATED 270 capsule 1  . LORazepam (ATIVAN) 1 MG tablet TAKE 1 TABLET BY MOUTH TWICE A DAY AS DIRECTED FOR ANXIETY/PANIC  2  . metoprolol succinate (TOPROL-XL) 50 MG 24 hr tablet TAKE 1 TABLET BY MOUTH EVERY DAY WITH OR IMMEDIATELY FOLLOWING A MEAL 90 tablet 1  . Multiple Vitamin (MULTIVITAMIN) tablet Take 1 tablet by mouth daily. Bariatric Vitamin    . pantoprazole (PROTONIX) 40 MG tablet TAKE 1 TABLET BY MOUTH EVERY DAY 90 tablet 1  . zolpidem (AMBIEN) 10 MG tablet Take 10 mg by mouth at bedtime as needed for sleep.    . traMADol (ULTRAM) 50 MG tablet TAKE 1 TABLET (50 MG TOTAL) BY MOUTH EVERY 6 (SIX) HOURS AS NEEDED. (Patient not taking: Reported on 01/10/2020) 90 tablet 5  . ferrous sulfate 325 (65 FE) MG EC tablet Take 325 mg by mouth daily. (Patient not taking: Reported on 01/10/2020)    . lidocaine (LIDODERM) 5 % Place 1 patch onto the skin daily. Remove & Discard patch within 12 hours or as directed by MD (Patient not taking: Reported on 01/10/2020) 30 patch 0  . pantoprazole (PROTONIX) 40 MG tablet TAKE 1 TABLET BY MOUTH EVERY DAY 90 tablet 1   No facility-administered medications prior to visit.    Allergies  Allergen Reactions  . Bacitracin-Polymyxin B Rash  . Eggs Or Egg-Derived Products   . Betadine [Povidone Iodine] Rash    Review of Systems  Constitutional: Negative for chills and fever.  Respiratory: Negative.   Cardiovascular: Negative.   Gastrointestinal: Negative for abdominal pain.  Genitourinary: Negative for difficulty urinating, dysuria and flank pain.  Musculoskeletal: Positive for back pain.  Hematological: Negative for adenopathy. Does not bruise/bleed easily.      Objective:    Physical Exam Vitals reviewed.  Constitutional:      Appearance: She is normal weight.  HENT:     Head:  Normocephalic and atraumatic.  Cardiovascular:     Rate and Rhythm: Normal rate and regular rhythm.     Pulses: Normal pulses.     Heart sounds: Normal heart sounds.  Pulmonary:     Effort: Pulmonary effort is normal.     Breath sounds: Normal breath sounds.  Abdominal:     Palpations: Abdomen is soft.     Tenderness: There is no abdominal tenderness. There is no right CVA tenderness or left CVA tenderness.  Musculoskeletal:        General: Normal range of motion.     Cervical back: Normal range of motion and neck supple.     Right lower leg: No edema.     Left lower leg: No edema.     Comments: Good lumbar ROM. Non tender lumbar spine. Positive right lateral lower back - muscle soreness. Neg SLR. Normal strength and sensation and gait.   Skin:    General: Skin is warm and dry.     Findings: No rash.  Neurological:     General: No focal deficit present.     Mental Status: She is alert and oriented to person, place, and time. Mental status is at baseline.     Motor: No weakness.     Gait: Gait normal.     Deep Tendon Reflexes: Reflexes normal.  Psychiatric:        Mood and Affect: Mood normal.        Behavior: Behavior normal.     BP 132/84 (BP Location: Left Arm, Patient Position: Sitting, Cuff Size: Normal)   Pulse 80   Temp 98.3 F (36.8 C) (Oral)   Ht 5\' 5"  (1.651 m)   Wt 169 lb (76.7 kg)   SpO2 99%   BMI 28.12 kg/m  Wt Readings from Last 3 Encounters:  01/10/20 169 lb (76.7 kg)  07/29/19 167 lb 6.4 oz (75.9 kg)  10/26/18 153 lb (69.4 kg)   Pulse Readings from Last 3 Encounters:  01/10/20 80  07/29/19 78  10/21/18 62    BP Readings from Last 3 Encounters:  01/10/20 132/84  07/29/19 110/78  10/21/18 (!) 172/94    Lab Results  Component Value Date   CHOL 193 07/29/2019   HDL 68.40 07/29/2019   LDLCALC 109 (H) 07/29/2019   LDLDIRECT 99.0 09/04/2015   TRIG 79.0 07/29/2019   CHOLHDL 3 07/29/2019      Health Maintenance Due  Topic Date Due  . HIV  Screening  Never done  . PAP SMEAR-Modifier  Never done  . MAMMOGRAM  02/25/2017  . COLONOSCOPY  09/06/2019  . INFLUENZA VACCINE  10/13/2019  . DEXA SCAN  Never done  . PNA vac Low Risk Adult (1 of 2 - PCV13) 12/23/2019    There are no preventive care reminders to display for this patient.  Lab Results  Component Value Date   TSH 0.59 07/29/2019   Lab Results  Component Value Date   WBC 4.7 07/29/2019   HGB 11.9 (L) 07/29/2019   HCT 35.8 (L) 07/29/2019   MCV 89.6 07/29/2019   PLT 184.0 07/29/2019   Lab Results  Component Value Date   NA 138 07/29/2019   K 3.5 07/29/2019   CO2 27 07/29/2019   GLUCOSE 89 07/29/2019   BUN 9 07/29/2019   CREATININE 0.68 07/29/2019   BILITOT 0.3 07/29/2019   ALKPHOS 71 07/29/2019   AST 22 07/29/2019   ALT 16 07/29/2019   PROT 6.4 07/29/2019   ALBUMIN 3.6 07/29/2019   CALCIUM 8.4 07/29/2019   ANIONGAP 13 05/07/2017   GFR 86.95 07/29/2019   Lab Results  Component Value Date   CHOL 193 07/29/2019   Lab Results  Component  Value Date   HDL 68.40 07/29/2019   Lab Results  Component Value Date   LDLCALC 109 (H) 07/29/2019   Lab Results  Component Value Date   TRIG 79.0 07/29/2019   Lab Results  Component Value Date   CHOLHDL 3 07/29/2019   Lab Results  Component Value Date   HGBA1C 5.7 07/29/2019      Assessment & Plan:   Problem List Items Addressed This Visit      Other   Acute right-sided low back pain without sciatica - Primary   Relevant Medications   cyclobenzaprine (FLEXERIL) 5 MG tablet   meloxicam (MOBIC) 7.5 MG tablet    Advised:  For your lumbar back pain, trial of meloxicam 7.5 mg once a day. Do not take any other medicine like Advil, Aleve, ibuprofen, Motrin, Goody's.   Monitor for black stool and GI upset.  Also, try muscle relaxer at bedtime and may take twice a day if not too sedating.   Use the heat and ice alternating as tolerated and whatever helps you.  Ok to use the [employer] Devices[employer] pas or other over  the counter pain patches.   OK to take Tylenol Arthritis as directed in addition to meloxicam if needed.    Call back and give us a symptom update next week. If not improving, I will consider Xray's and referral to Orthopedics/Physical Therapy if needed.   Meds ordered this encounter  Medications  . cyclobenzaprine (FLEXERIL) 5 MG tablet    Sig: Take 1 tablet (5 mg total) by mouth 2 (two) times daily between meals as needed for up to 7 days for muscle spasms.    Dispense:  14 tablet    Refill:  0    Order Specific Question:   Supervising Provider    Answer:   Dale DurhamSCOTT, CHARLENE [id number]  . meloxicam (MOBIC) 7.5 MG tablet    Sig: Take 1 tablet (7.5 mg total) by mouth daily for 7 days.    Dispense:  7 tablet    Refill:  0    Order Specific Question:   Supervising Provider    Answer:   Dale DurhamSCOTT, CHARLENE [[id number]]    Follow-up: Return if symptoms worsen or fail to improve.  This visit occurred during the SARS-CoV-2 public health emergency.  Safety protocols were in place, including screening questions prior to the visit, additional usage of staff PPE, and extensive cleaning of exam room while observing appropriate contact time as indicated for disinfecting solutions.   Amedeo KinsmanKimberly Fawne Hughley, NP

## 2020-01-10 NOTE — Patient Instructions (Addendum)
For your lumbar back pain, trial of meloxicam 7.5 mg once a day. Do not take any other medicine like Advil, Aleve, ibuprofen, Motrin, Goody's.   Monitor for black stool and GI upset.  Also, try muscle relaxer at bedtime and may take twice a day if not too sedating.   Use the heat and ice alternating as tolerated and whatever helps you.  Ok to use the [employer] or other over the counter pain patches.   OK to take Tylenol Arthritis as directed in addition to meloxicam if needed.    Call back and give [place] a symptom update next week. If not improving, I will consider Xray's and referral to Orthopedics/Physical Therapy if needed.    Acute Back Pain, Adult Acute back pain is sudden and usually short-lived. It is often caused by an injury to the muscles and tissues in the back. The injury may result from:  A muscle or ligament getting overstretched or torn (strained). Ligaments are tissues that connect bones to each other. Lifting something improperly can cause a back strain.  Wear and tear (degeneration) of the spinal disks. Spinal disks are circular tissue that provides cushioning between the bones of the spine (vertebrae).  Twisting motions, such as while playing sports or doing yard work.  A hit to the back.  Arthritis. You may have a physical exam, lab tests, and imaging tests to find the cause of your pain. Acute back pain usually goes away with rest and home care. Follow these instructions at home: Managing pain, stiffness, and swelling  Take over-the-counter and prescription medicines only as told by your health care provider.  Your health care provider may recommend applying ice during the first 24-48 hours after your pain starts. To do this: ? Put ice in a plastic bag. ? Place a towel between your skin and the bag. ? Leave the ice on for 20 minutes, 2-3 times a day.  If directed, apply heat to the affected area as often as told by your health care provider. Use the heat source  that your health care provider recommends, such as a moist heat pack or a heating pad. ? Place a towel between your skin and the heat source. ? Leave the heat on for 20-30 minutes. ? Remove the heat if your skin turns bright red. This is especially important if you are unable to feel pain, heat, or cold. You have a greater risk of getting burned. Activity   Do not stay in bed. Staying in bed for more than 1-2 days can delay your recovery.  Sit up and stand up straight. Avoid leaning forward when you sit, or hunching over when you stand. ? If you work at a desk, sit close to it so you do not need to lean over. Keep your chin tucked in. Keep your neck drawn back, and keep your elbows bent at a right angle. Your arms should look like the letter "L." ? Sit high and close to the steering wheel when you drive. Add lower back (lumbar) support to your car seat, if needed.  Take short walks on even surfaces as soon as you are able. Try to increase the length of time you walk each day.  Do not sit, drive, or stand in one place for more than 30 minutes at a time. Sitting or standing for long periods of time can put stress on your back.  Do not drive or use heavy machinery while taking prescription pain medicine.  Use proper  lifting techniques. When you bend and lift, use positions that put less stress on your back: ? [place] your knees. ? Keep the load close to your body. ? Avoid twisting.  Exercise regularly as told by your health care provider. Exercising helps your back heal faster and helps prevent back injuries by keeping muscles strong and flexible.  Work with a physical therapist to make a safe exercise program, as recommended by your health care provider. Do any exercises as told by your physical therapist. Lifestyle  Maintain a healthy weight. Extra weight puts stress on your back and makes it difficult to have good posture.  Avoid activities or situations that make you feel anxious or  stressed. Stress and anxiety increase muscle tension and can make back pain worse. Learn ways to manage anxiety and stress, such as through exercise. General instructions  Sleep on a firm mattress in a comfortable position. Try lying on your side with your knees slightly bent. If you lie on your back, put a pillow under your knees.  Follow your treatment plan as told by your health care provider. This may include: ? Cognitive or behavioral therapy. ? Acupuncture or massage therapy. ? Meditation or yoga. Contact a health care provider if:  You have pain that is not relieved with rest or medicine.  You have increasing pain going down into your legs or buttocks.  Your pain does not improve after 2 weeks.  You have pain at night.  You lose weight without trying.  You have a fever or chills. Get help right away if:  You develop new bowel or bladder control problems.  You have unusual weakness or numbness in your arms or legs.  You develop nausea or vomiting.  You develop abdominal pain.  You feel faint. Summary  Acute back pain is sudden and usually short-lived.  Use proper lifting techniques. When you bend and lift, use positions that put less stress on your back.  Take over-the-counter and prescription medicines and apply heat or ice as directed by your health care provider. This information is not intended to replace advice given to you by your health care provider. Make sure you discuss any questions you have with your health care provider. Document Revised: 06/19/2018 Document Reviewed: 10/12/2016 Elsevier Patient Education  2020 [employer].

## 2020-01-12 ENCOUNTER — Encounter: Payer: Self-pay | Admitting: Nurse Practitioner

## 2020-02-02 ENCOUNTER — Other Ambulatory Visit: Payer: Self-pay | Admitting: Internal Medicine

## 2020-02-13 ENCOUNTER — Encounter: Payer: Self-pay | Admitting: Internal Medicine

## 2020-02-13 ENCOUNTER — Other Ambulatory Visit: Payer: Self-pay

## 2020-02-13 ENCOUNTER — Ambulatory Visit (INDEPENDENT_AMBULATORY_CARE_PROVIDER_SITE_OTHER): Payer: Medicare Other | Admitting: Internal Medicine

## 2020-02-13 VITALS — BP 140/90 | HR 85 | Temp 98.2°F | Ht 65.0 in | Wt 166.0 lb

## 2020-02-13 DIAGNOSIS — R350 Frequency of micturition: Secondary | ICD-10-CM | POA: Diagnosis not present

## 2020-02-13 DIAGNOSIS — R319 Hematuria, unspecified: Secondary | ICD-10-CM

## 2020-02-13 DIAGNOSIS — N3000 Acute cystitis without hematuria: Secondary | ICD-10-CM

## 2020-02-13 DIAGNOSIS — R300 Dysuria: Secondary | ICD-10-CM

## 2020-02-13 DIAGNOSIS — Z23 Encounter for immunization: Secondary | ICD-10-CM

## 2020-02-13 DIAGNOSIS — R309 Painful micturition, unspecified: Secondary | ICD-10-CM

## 2020-02-13 MED ORDER — CIPROFLOXACIN HCL 500 MG PO TABS: 500.0000 mg | ORAL_TABLET | Freq: Two times a day (BID) | ORAL | 0 refills | Status: DC

## 2020-02-13 MED ORDER — PHENAZOPYRIDINE HCL 200 MG PO TABS: 200.0000 mg | ORAL_TABLET | Freq: Three times a day (TID) | ORAL | 0 refills | Status: DC | PRN

## 2020-02-13 NOTE — Patient Instructions (Signed)
Cranberry supplements and D mannose supplements   Urinary Tract Infection, Adult  A urinary tract infection (UTI) is an infection of any part of the urinary tract. The urinary tract includes the kidneys, ureters, bladder, and urethra. These organs make, store, and get rid of urine in the body. Your health care provider may use other names to describe the infection. An upper UTI affects the ureters and kidneys (pyelonephritis). A lower UTI affects the bladder (cystitis) and urethra (urethritis). What are the causes? Most urinary tract infections are caused by bacteria in your genital area, around the entrance to your urinary tract (urethra). These bacteria grow and cause inflammation of your urinary tract. What increases the risk? You are more likely to develop this condition if:  You have a urinary catheter that stays in place (indwelling).  You are not able to control when you urinate or have a bowel movement (you have incontinence).  You are female and you: ? Use a spermicide or diaphragm for birth control. ? Have low estrogen levels. ? Are pregnant.  You have certain genes that increase your risk (genetics).  You are sexually active.  You take antibiotic medicines.  You have a condition that causes your flow of urine to slow down, such as: ? An enlarged prostate, if you are female. ? Blockage in your urethra (stricture). ? A kidney stone. ? A nerve condition that affects your bladder control (neurogenic bladder). ? Not getting enough to drink, or not urinating often.  You have certain medical conditions, such as: ? Diabetes. ? A weak disease-fighting system (immunesystem). ? Sickle cell disease. ? Gout. ? Spinal cord injury. What are the signs or symptoms? Symptoms of this condition include:  Needing to urinate right away (urgently).  Frequent urination or passing small amounts of urine frequently.  Pain or burning with urination.  Blood in the urine.  Urine that  smells bad or unusual.  Trouble urinating.  Cloudy urine.  Vaginal discharge, if you are female.  Pain in the abdomen or the lower back. You may also have:  Vomiting or a decreased appetite.  Confusion.  Irritability or tiredness.  A fever.  Diarrhea. The first symptom in older adults may be confusion. In some cases, they may not have any symptoms until the infection has worsened. How is this diagnosed? This condition is diagnosed based on your medical history and a physical exam. You may also have other tests, including:  Urine tests.  Blood tests.  Tests for sexually transmitted infections (STIs). If you have had more than one UTI, a cystoscopy or imaging studies may be done to determine the cause of the infections. How is this treated? Treatment for this condition includes:  Antibiotic medicine.  Over-the-counter medicines to treat discomfort.  Drinking enough water to stay hydrated. If you have frequent infections or have other conditions such as a kidney stone, you may need to see a health care provider who specializes in the urinary tract (urologist). In rare cases, urinary tract infections can cause sepsis. Sepsis is a life-threatening condition that occurs when the body responds to an infection. Sepsis is treated in the hospital with IV antibiotics, fluids, and other medicines. Follow these instructions at home:  Medicines  Take over-the-counter and prescription medicines only as told by your health care provider.  If you were prescribed an antibiotic medicine, take it as told by your health care provider. Do not stop using the antibiotic even if you start to feel better. General instructions  Make  sure you: ? Empty your bladder often and completely. Do not hold urine for long periods of time. ? Empty your bladder after sex. ? Wipe from front to back after a bowel movement if you are female. Use each tissue one time when you wipe.  Drink enough fluid to  keep your urine pale yellow.  Keep all follow-up visits as told by your health care provider. This is important. Contact a health care provider if:  Your symptoms do not get better after 1-2 days.  Your symptoms go away and then return. Get help right away if you have:  Severe pain in your back or your lower abdomen.  A fever.  Nausea or vomiting. Summary  A urinary tract infection (UTI) is an infection of any part of the urinary tract, which includes the kidneys, ureters, bladder, and urethra.  Most urinary tract infections are caused by bacteria in your genital area, around the entrance to your urinary tract (urethra).  Treatment for this condition often includes antibiotic medicines.  If you were prescribed an antibiotic medicine, take it as told by your health care provider. Do not stop using the antibiotic even if you start to feel better.  Keep all follow-up visits as told by your health care provider. This is important. This information is not intended to replace advice given to you by your health care provider. Make sure you discuss any questions you have with your health care provider. Document Revised: 02/15/2018 Document Reviewed: 09/07/2017 Elsevier Patient Education  2020 [employer].

## 2020-02-13 NOTE — Progress Notes (Signed)
Chief Complaint  Patient presents with  . Urinary Tract Infection   UTI  1. Increased freq, burning and pain with urination > 1 week, right back pain and blood in urine tried azo pain relief otc and [facility] in [facility] has given her premarin which she uses 2-3 x per month she is c/w UTI has had in the past     Review of Systems  Constitutional: Negative for fever.  Gastrointestinal: Positive for abdominal pain.  Genitourinary: Positive for dysuria, frequency and urgency.   Past Medical History:  Diagnosis Date  . Anemia    Low iron and hemoglobin  . Anginal pain (HCC)    states she thinks it is stress  . Anxiety   . Asthma   . Depression   . Dysrhythmia    states she has PVC's  . Fibromyalgia   . Hypertension   . Recurrent vomiting 02/04/2018  . Schatzki's ring    dialated 3 times by Markham Jordan    Past Surgical History:  Procedure Laterality Date  . ABDOMINAL ADHESION SURGERY    . BUNIONECTOMY     left foot   . CERVICAL FUSION    . CESAREAN SECTION    . CHOLECYSTECTOMY  June 2013  . GASTRIC BYPASS     Family History  Problem Relation Age of Onset  . Diabetes Mother   . Hypertension Mother   . Heart disease Father   . Breast cancer Maternal Aunt   . Cancer Maternal Aunt   . Colon cancer Neg Hx   . Ovarian cancer Neg Hx    Social History   Socioeconomic History  . Marital status: Divorced    Spouse name: Not on file  . Number of children: Not on file  . Years of education: Not on file  . Highest education level: Not on file  Occupational History  . Not on file  Tobacco Use  . Smoking status: Never Smoker  . Smokeless tobacco: Never Used  Substance and Sexual Activity  . Alcohol use: Yes    Alcohol/week: 3.0 standard drinks    Types: 3 Glasses of wine per week    Comment: once weekly  . Drug use: No  . Sexual activity: Yes  Other Topics Concern  . Not on file  Social History Narrative   Lives with spouse, son, and stepson who has serious psychiatric  issues and alcohol abuse   Social Determinants of Health   Financial Resource Strain:   . Difficulty of Paying Living Expenses: Not on file  Food Insecurity:   . Worried About Programme researcher, broadcasting/film/video in the Last Year: Not on file  . Ran Out of Food in the Last Year: Not on file  Transportation Needs:   . Lack of Transportation (Medical): Not on file  . Lack of Transportation (Non-Medical): Not on file  Physical Activity:   . Days of Exercise per Week: Not on file  . Minutes of Exercise per Session: Not on file  Stress:   . Feeling of Stress : Not on file  Social Connections:   . Frequency of Communication with Friends and Family: Not on file  . Frequency of Social Gatherings with Friends and Family: Not on file  . Attends Religious Services: Not on file  . Active Member of Clubs or Organizations: Not on file  . Attends Banker Meetings: Not on file  . Marital Status: Not on file  Intimate Partner Violence:   . Fear of Current  or Ex-Partner: Not on file  . Emotionally Abused: Not on file  . Physically Abused: Not on file  . Sexually Abused: Not on file   Current Meds  Medication Sig  . calcium carbonate (OS-CAL) 600 MG TABS Take 600 mg by mouth 2 (two) times daily with a meal.  . conjugated estrogens (PREMARIN) vaginal cream Place 1 Applicatorful vaginally every 30 (thirty) days. 2-3 x per month  . escitalopram (LEXAPRO) 20 MG tablet Take 20 mg by mouth daily.   Marland Kitchen gabapentin (NEURONTIN) 100 MG capsule LIMIT 1-3 CAPSULES BY MOUTH PER DAY IF TOLERATED  . LORazepam (ATIVAN) 1 MG tablet TAKE 1 TABLET BY MOUTH TWICE A DAY AS DIRECTED FOR ANXIETY/PANIC  . metoprolol succinate (TOPROL-XL) 50 MG 24 hr tablet TAKE 1 TABLET BY MOUTH EVERY DAY WITH OR IMMEDIATELY FOLLOWING A MEAL  . Multiple Vitamin (MULTIVITAMIN) tablet Take 1 tablet by mouth daily. Bariatric Vitamin  . nortriptyline (PAMELOR) 10 MG capsule Take 20 mg by mouth at bedtime.  . pantoprazole (PROTONIX) 40 MG tablet  TAKE 1 TABLET BY MOUTH EVERY DAY  . traMADol (ULTRAM) 50 MG tablet TAKE 1 TABLET (50 MG TOTAL) BY MOUTH EVERY 6 (SIX) HOURS AS NEEDED.  Marland Kitchen zolpidem (AMBIEN) 10 MG tablet Take 10 mg by mouth at bedtime as needed for sleep.   Allergies  Allergen Reactions  . Bacitracin-Polymyxin B Rash  . Eggs Or Egg-Derived Products   . Betadine [Povidone Iodine] Rash   No results found for this or any previous visit (from the past 2160 hour(s)). Objective  Body mass index is 27.62 kg/m. Wt Readings from Last 3 Encounters:  02/13/20 166 lb (75.3 kg)  01/10/20 169 lb (76.7 kg)  07/29/19 167 lb 6.4 oz (75.9 kg)   Temp Readings from Last 3 Encounters:  02/13/20 98.2 F (36.8 C) (Oral)  01/10/20 98.3 F (36.8 C) (Oral)  07/29/19 (!) 97.5 F (36.4 C) (Temporal)   BP Readings from Last 3 Encounters:  02/13/20 140/90  01/10/20 132/84  07/29/19 110/78   Pulse Readings from Last 3 Encounters:  02/13/20 85  01/10/20 80  07/29/19 78    Physical Exam Vitals and nursing note reviewed.  Constitutional:      Appearance: Normal appearance. She is well-developed and well-groomed.  HENT:     Head: Normocephalic and atraumatic.     Right Ear: Tympanic membrane normal.     Left Ear: Tympanic membrane normal.  Cardiovascular:     Rate and Rhythm: Normal rate and regular rhythm.     Heart sounds: Normal heart sounds. No murmur heard.   Pulmonary:     Effort: Pulmonary effort is normal.     Breath sounds: Normal breath sounds.  Abdominal:     General: Abdomen is flat. Bowel sounds are normal.     Tenderness: There is abdominal tenderness in the suprapubic area. There is no right CVA tenderness or left CVA tenderness.     Comments: Mild suprapubic ttp   Skin:    General: Skin is warm and dry.  Neurological:     General: No focal deficit present.     Mental Status: She is alert and oriented to person, place, and time. Mental status is at baseline.     Gait: Gait normal.  Psychiatric:         Attention and Perception: Attention and perception normal.        Mood and Affect: Mood and affect normal.        Speech: Speech  normal.        Behavior: Behavior normal. Behavior is cooperative.        Thought Content: Thought content normal.        Cognition and Memory: Cognition and memory normal.        Judgment: Judgment normal.     Assessment  Plan  Acute cystitis without hematuria - Plan: phenazopyridine (PYRIDIUM) 200 MG tablet tid prn, ciprofloxacin (CIPRO) 500 MG tablet bid prn 3-5 days Urine Culture, Urinalysis, Routine w reflex microscopic Increase water  Cranberry with D mannose  Given high dose flu shot today 02/13/20   Provider: Dr. French Ana McLean-Scocuzza-[facility]

## 2020-02-13 NOTE — Progress Notes (Signed)
flufluOnset of symptoms for over a week, burning, frequency, and pain with urination. Has been taking AZO.

## 2020-02-14 LAB — URINALYSIS, ROUTINE W REFLEX MICROSCOPIC
Bilirubin Urine: NEGATIVE
Ketones, ur: NEGATIVE
Nitrite: POSITIVE — AB
Specific Gravity, Urine: 1.015 (ref 1.000–1.030)
Urine Glucose: NEGATIVE
Urobilinogen, UA: 1 (ref 0.0–1.0)
pH: 6 (ref 5.0–8.0)

## 2020-02-14 NOTE — Addendum Note (Signed)
Addended by: Warden Fillers on: 02/14/2020 08:18 AM   Modules accepted: Orders

## 2020-02-16 LAB — URINE CULTURE
MICRO NUMBER:: 11274524
SPECIMEN QUALITY:: ADEQUATE

## 2020-02-16 LAB — URINALYSIS, ROUTINE W REFLEX MICROSCOPIC

## 2020-02-28 DIAGNOSIS — H40003 Preglaucoma, unspecified, bilateral: Secondary | ICD-10-CM | POA: Diagnosis not present

## 2020-04-01 ENCOUNTER — Telehealth: Payer: Self-pay | Admitting: Internal Medicine

## 2020-04-01 NOTE — Telephone Encounter (Signed)
I called pt twice. First time pt answered and hung up after verifing I was speaking with pt. Second call went to vm and vm was full. Unable to leave message. Pt due to schedule Medicare Annual Wellness Visit (AWV)   This should be a virtual visit only=30 minutes.  No hx of AWV; please schedule at anytime with Denisa O'Brien-Blaney at [facility]  *need to verify home number. Phone number invalid*

## 2020-04-06 ENCOUNTER — Ambulatory Visit (INDEPENDENT_AMBULATORY_CARE_PROVIDER_SITE_OTHER): Payer: Medicare Other

## 2020-04-06 VITALS — Ht 65.0 in | Wt 166.0 lb

## 2020-04-06 DIAGNOSIS — E2839 Other primary ovarian failure: Secondary | ICD-10-CM

## 2020-04-06 DIAGNOSIS — Z0000 Encounter for general adult medical examination without abnormal findings: Secondary | ICD-10-CM

## 2020-04-06 NOTE — Patient Instructions (Addendum)
Ms. Elster , Thank you for taking time to come for your Medicare Wellness Visit. I appreciate your ongoing commitment to your health goals. Please review the following plan we discussed and let me know if I can assist you in the future.    These are the goals we discussed: Goals    . Weight (lb) < 166 lb (75.3 kg)     I would like to lose about 10lbs Increase activity        This is a list of the screening recommended for you and due dates:  Health Maintenance  Topic Date Due  . HIV Screening  Never done  . Pap Smear  Never done  . Mammogram  02/25/2017  . DEXA scan (bone density measurement)  Never done  . Pneumonia vaccines (1 of 2 - PCV13) 04/06/2021*  . Tetanus Vaccine  07/13/2022  . Colon Cancer Screening  07/30/2029  . Flu Shot  Completed  . COVID-19 Vaccine  Completed  .  Hepatitis C: One time screening is recommended by Center for Disease Control  (CDC) for  adults born from [birth year] through 1966.   Completed  *Topic was postponed. The date shown is not the original due date.    Immunizations Immunization History  Administered Date(s) Administered  . Fluad Quad(high Dose 65+) 02/13/2020  . Hepatitis B 07/12/2012  . Influenza,inj,Quad PF,6+ Mos 01/20/2014, 02/02/2018, 01/30/2019  . Influenza-Unspecified 01/13/2012  . MMR 07/12/2012  . PFIZER(Purple Top)SARS-COV-2 Vaccination 06/02/2019, 06/23/2019, 01/29/2020  . Tdap 01/13/2012, 07/12/2012   Advanced directives: not yet completed.  Conditions/risks identified: none new.  Follow up in one year for your annual wellness visit.   Preventive Care 39 Years and Older, Female Preventive care refers to lifestyle choices and visits with your health care provider that can promote health and wellness. What does preventive care include?  A yearly physical exam. This is also called an annual well check.  Dental exams once or twice a year.  Routine eye exams. Ask your health care provider how often you should have your eyes  checked.  Personal lifestyle choices, including:  Daily care of your teeth and gums.  Regular physical activity.  Eating a healthy diet.  Avoiding tobacco and drug use.  Limiting alcohol use.  Practicing safe sex.  Taking low-dose aspirin every day.  Taking vitamin and mineral supplements as recommended by your health care provider. What happens during an annual well check? The services and screenings done by your health care provider during your annual well check will depend on your age, overall health, lifestyle risk factors, and family history of disease. Counseling  Your health care provider may ask you questions about your:  Alcohol use.  Tobacco use.  Drug use.  Emotional well-being.  Home and relationship well-being.  Sexual activity.  Eating habits.  History of falls.  Memory and ability to understand (cognition).  Work and work Statistician.  Reproductive health. Screening  You may have the following tests or measurements:  Height, weight, and BMI.  Blood pressure.  Lipid and cholesterol levels. These may be checked every 5 years, or more frequently if you are over 52 years old.  Skin check.  Lung cancer screening. You may have this screening every year starting at age 106 if you have a 30-pack-year history of smoking and currently smoke or have quit within the past 15 years.  Fecal occult blood test (FOBT) of the stool. You may have this test every year starting at age 40.  Flexible  sigmoidoscopy or colonoscopy. You may have a sigmoidoscopy every 5 years or a colonoscopy every 10 years starting at age 78.  Hepatitis C blood test.  Hepatitis B blood test.  Sexually transmitted disease (STD) testing.  Diabetes screening. This is done by checking your blood sugar (glucose) after you have not eaten for a while (fasting). You may have this done every 1-3 years.  Bone density scan. This is done to screen for osteoporosis. You may have this done  starting at age 55.  Mammogram. This may be done every 1-2 years. Talk to your health care provider about how often you should have regular mammograms. Talk with your health care provider about your test results, treatment options, and if necessary, the need for more tests. Vaccines  Your health care provider may recommend certain vaccines, such as:  Influenza vaccine. This is recommended every year.  Tetanus, diphtheria, and acellular pertussis (Tdap, Td) vaccine. You may need a Td booster every 10 years.  Zoster vaccine. You may need this after age 39.  Pneumococcal 13-valent conjugate (PCV13) vaccine. One dose is recommended after age 73.  Pneumococcal polysaccharide (PPSV23) vaccine. One dose is recommended after age 79. Talk to your health care provider about which screenings and vaccines you need and how often you need them. This information is not intended to replace advice given to you by your health care provider. Make sure you discuss any questions you have with your health care provider. Document Released: 03/27/2015 Document Revised: 11/18/2015 Document Reviewed: 12/30/2014 Elsevier Interactive Patient Education  2017 [employer] Prevention in the Home Falls can cause injuries. They can happen to people of all ages. There are many things you can do to make your home safe and to help prevent falls. What can I do on the outside of my home?  Regularly fix the edges of walkways and driveways and fix any cracks.  Remove anything that might make you trip as you walk through a door, such as a raised step or threshold.  Trim any bushes or trees on the path to your home.  Use bright outdoor lighting.  Clear any walking paths of anything that might make someone trip, such as rocks or tools.  Regularly check to see if handrails are loose or broken. Make sure that both sides of any steps have handrails.  Any raised decks and porches should have guardrails on the  edges.  Have any leaves, snow, or ice cleared regularly.  Use sand or salt on walking paths during winter.  Clean up any spills in your garage right away. This includes oil or grease spills. What can I do in the bathroom?  Use night lights.  Install grab bars by the toilet and in the tub and shower. Do not use towel bars as grab bars.  Use non-skid mats or decals in the tub or shower.  If you need to sit down in the shower, use a plastic, non-slip stool.  Keep the floor dry. Clean up any water that spills on the floor as soon as it happens.  Remove soap buildup in the tub or shower regularly.  Attach bath mats securely with double-sided non-slip rug tape.  Do not have throw rugs and other things on the floor that can make you trip. What can I do in the bedroom?  Use night lights.  Make sure that you have a light by your bed that is easy to reach.  Do not use any sheets or blankets that  are too big for your bed. They should not hang down onto the floor.  Have a firm chair that has side arms. You can use this for support while you get dressed.  Do not have throw rugs and other things on the floor that can make you trip. What can I do in the kitchen?  Clean up any spills right away.  Avoid walking on wet floors.  Keep items that you use a lot in easy-to-reach places.  If you need to reach something above you, use a strong step stool that has a grab bar.  Keep electrical cords out of the way.  Do not use floor polish or wax that makes floors slippery. If you must use wax, use non-skid floor wax.  Do not have throw rugs and other things on the floor that can make you trip. What can I do with my stairs?  Do not leave any items on the stairs.  Make sure that there are handrails on both sides of the stairs and use them. Fix handrails that are broken or loose. Make sure that handrails are as long as the stairways.  Check any carpeting to make sure that it is firmly  attached to the stairs. Fix any carpet that is loose or worn.  Avoid having throw rugs at the top or bottom of the stairs. If you do have throw rugs, attach them to the floor with carpet tape.  Make sure that you have a light switch at the top of the stairs and the bottom of the stairs. If you do not have them, ask someone to add them for you. What else can I do to help prevent falls?  Wear shoes that:  Do not have high heels.  Have rubber bottoms.  Are comfortable and fit you well.  Are closed at the toe. Do not wear sandals.  If you use a stepladder:  Make sure that it is fully opened. Do not climb a closed stepladder.  Make sure that both sides of the stepladder are locked into place.  Ask someone to hold it for you, if possible.  Clearly mark and make sure that you can see:  Any grab bars or handrails.  First and last steps.  Where the edge of each step is.  Use tools that help you move around (mobility aids) if they are needed. These include:  Canes.  Walkers.  Scooters.  Crutches.  Turn on the lights when you go into a dark area. Replace any light bulbs as soon as they burn out.  Set up your furniture so you have a clear path. Avoid moving your furniture around.  If any of your floors are uneven, fix them.  If there are any pets around you, be aware of where they are.  Review your medicines with your doctor. Some medicines can make you feel dizzy. This can increase your chance of falling. Ask your doctor what other things that you can do to help prevent falls. This information is not intended to replace advice given to you by your health care provider. Make sure you discuss any questions you have with your health care provider. Document Released: 12/25/2008 Document Revised: 08/06/2015 Document Reviewed: 04/04/2014 Elsevier Interactive Patient Education  2017 [employer].

## 2020-04-06 NOTE — Progress Notes (Addendum)
Subjective:   Sherri Lee is a 65 y.o. female who presents for an Initial Medicare Annual Wellness Visit.  Review of Systems    No ROS.  Medicare Wellness Virtual Visit.    Cardiac Risk Factors include: advanced age (>27mn, >>42women);hypertension     Objective:    Today's Vitals   04/06/20 1402  Weight: 166 lb (75.3 kg)  Height: 5' 5"  (1.651 m)   Body mass index is 27.62 kg/m.  Advanced Directives 04/06/2020 10/21/2018 05/07/2017 05/07/2017 03/31/2017 09/16/2016 07/22/2016  Does Patient Have a Medical Advance Directive? No No No No No No No  Would patient like information on creating a medical advance directive? No - Patient declined No - Patient declined No - Patient declined No - Patient declined - - No - Patient declined    Current Medications (verified) Outpatient Encounter Medications as of 04/06/2020  Medication Sig   acyclovir (ZOVIRAX) 400 MG tablet Take 1 tablet (400 mg total) by mouth 5 (five) times daily. (Patient not taking: Reported on 02/13/2020)   calcium carbonate (OS-CAL) 600 MG TABS Take 600 mg by mouth 2 (two) times daily with a meal.   ciprofloxacin (CIPRO) 500 MG tablet Take 1 tablet (500 mg total) by mouth 2 (two) times daily. With food x 3-5 days   conjugated estrogens (PREMARIN) vaginal cream Place 1 Applicatorful vaginally every 30 (thirty) days. 2-3 x per month   escitalopram (LEXAPRO) 20 MG tablet Take 20 mg by mouth daily.    gabapentin (NEURONTIN) 100 MG capsule LIMIT 1-3 CAPSULES BY MOUTH PER DAY IF TOLERATED   LORazepam (ATIVAN) 1 MG tablet TAKE 1 TABLET BY MOUTH TWICE A DAY AS DIRECTED FOR ANXIETY/PANIC   metoprolol succinate (TOPROL-XL) 50 MG 24 hr tablet TAKE 1 TABLET BY MOUTH EVERY DAY WITH OR IMMEDIATELY FOLLOWING A MEAL   Multiple Vitamin (MULTIVITAMIN) tablet Take 1 tablet by mouth daily. Bariatric Vitamin   nortriptyline (PAMELOR) 10 MG capsule Take 20 mg by mouth at bedtime.   pantoprazole (PROTONIX) 40 MG tablet TAKE 1 TABLET BY  MOUTH EVERY DAY   phenazopyridine (PYRIDIUM) 200 MG tablet Take 1 tablet (200 mg total) by mouth 3 (three) times daily as needed for pain. X 2-3 days   traMADol (ULTRAM) 50 MG tablet TAKE 1 TABLET (50 MG TOTAL) BY MOUTH EVERY 6 (SIX) HOURS AS NEEDED.   zolpidem (AMBIEN) 10 MG tablet Take 10 mg by mouth at bedtime as needed for sleep.   No facility-administered encounter medications on file as of 04/06/2020.    Allergies (verified) Bacitracin-polymyxin b, Eggs or egg-derived products, and Betadine [povidone iodine]   History: Past Medical History:  Diagnosis Date   Anemia    Low iron and hemoglobin   [place]inal pain [place]    states she thinks it is stress   Anxiety    Asthma    Depression    Dysrhythmia    states she has PVC's   Fibromyalgia    Hypertension    Recurrent vomiting 02/04/2018   Schatzki's ring    dialated 3 times by ETiffany Kocher   Past Surgical History:  Procedure Laterality Date   ABDOMINAL ADHESION SURGERY     BUNIONECTOMY     left foot    CERVICAL FUSION     CESAREAN SECTION     CHOLECYSTECTOMY  June 2013   GASTRIC BYPASS     Family History  Problem Relation Age of Onset   Hypertension Mother    Glaucoma Mother  Heart disease Father    Stroke Father    Breast cancer Maternal Aunt    Cancer Maternal Aunt    Colon cancer Neg Hx    Ovarian cancer Neg Hx    Social History   Socioeconomic History   Marital status: Divorced    Spouse name: Not on file   Number of children: Not on file   Years of education: Not on file   Highest education level: Not on file  Occupational History   Not on file  Tobacco Use   Smoking status: Never Smoker   Smokeless tobacco: Never Used  Substance and Sexual Activity   Alcohol use: Yes    Alcohol/week: 3.0 standard drinks    Types: 3 Glasses of wine per week    Comment: once weekly   Drug use: No   Sexual activity: Yes  Other Topics Concern   Not on file  Social History Narrative   Lives with spouse, son, and  stepson who has serious psychiatric issues and alcohol abuse   Social Determinants of Health   Financial Resource Strain: Low Risk    Difficulty of Paying Living Expenses: Not hard at all  Food Insecurity: No Food Insecurity   Worried About Charity fundraiser in the Last Year: Never true   [place] in the Last Year: Never true  Transportation Needs: No Transportation Needs   Lack of Transportation (Medical): No   Lack of Transportation (Non-Medical): No  Physical Activity: Not on file  Stress: No Stress Concern Present   Feeling of Stress : Not at all  Social Connections: Unknown   Frequency of Communication with Friends and Family: More than three times a week   Frequency of Social Gatherings with Friends and Family: More than three times a week   Attends Religious Services: Not on Electrical engineer or Organizations: Not on file   Attends Archivist Meetings: Not on file   Marital Status: Not on file    Tobacco Counseling Counseling given: Not Answered   Clinical Intake:  Pre-visit preparation completed: Yes       Diabetes: No  How often do you need to have someone help you when you read instructions, pamphlets, or other written materials from your doctor or pharmacy?: 1 - Never    Interpreter Needed?: No    Activities of Daily Living In your present state of health, do you have any difficulty performing the following activities: 04/06/2020  Hearing? N  Vision? N  Difficulty concentrating or making decisions? N  Walking or climbing stairs? N  Dressing or bathing? N  Doing errands, shopping? N  Preparing Food and eating ? N  Using the Toilet? N  In the past six months, have you accidently leaked urine? N  Do you have problems with loss of bowel control? N  Managing your Medications? N  Managing your Finances? N  Housekeeping or managing your Housekeeping? N  Some recent data might be hidden    Patient Care Team: Crecencio Mc, MD as PCP - General (Internal Medicine)  Indicate any recent Medical Services you may have received from other than [facility] providers in the past year (date may be approximate).     Assessment:   This is a routine wellness examination for Sherri Lee.  I connected with Sherri Lee today by telephone and verified that I am speaking with the correct person using two identifiers. Location patient: home Location provider: work Persons  participating in the virtual visit: patient, nurse.    I discussed the limitations, risks, security and privacy concerns of performing an evaluation and management service by telephone and the availability of in person appointments. The patient expressed understanding and verbally consented to this telephonic visit.    Interactive audio and video telecommunications were attempted between this provider and patient, however failed, due to patient having technical difficulties OR patient did not have access to video capability.  We continued and completed visit with audio only.  Some vital signs may be absent or patient reported.   Hearing/Vision screen  Hearing Screening   125Hz  250Hz  500Hz  1000Hz  2000Hz  3000Hz  4000Hz  6000Hz  8000Hz   Right ear:           Left ear:           Comments: Patient is able to hear conversational tones without difficulty.  No issues reported.  Vision Screening Comments: Followed by Dr. Cephus Shelling Wears corrective lenses Cataract extraction, bilateral Visual acuity not assessed, virtual visit.  They have seen their ophthalmologist in the last 12 months.    Dietary issues and exercise activities discussed: Current Exercise Habits: The patient does not participate in regular exercise at present  [place] water intake  Goals      Weight (lb) < 166 lb (75.3 kg)     I would like to lose about 10lbs Increase activity        Depression Screen PHQ 2/9 Scores 04/06/2020 10/26/2018 06/26/2017 09/24/2014  PHQ - 2 Score 0 0 5 0  PHQ- 9  Score - - 20 -    Fall Risk Fall Risk  04/06/2020 02/13/2020 07/29/2019 10/26/2018 09/24/2014  Falls in the past year? 1 1 1 1  No  Number falls in past yr: 0 0 0 0 -  Injury with Fall? 0 0 1 1 -  Follow up Falls evaluation completed Falls evaluation completed Falls evaluation completed Falls evaluation completed -    FALL RISK PREVENTION PERTAINING TO THE HOME: Handrails in use when climbing stairs? Yes Home free of loose throw rugs in walkways, pet beds, electrical cords, etc? Yes  Adequate lighting in your home to reduce risk of falls? Yes   ASSISTIVE DEVICES UTILIZED TO PREVENT FALLS: Life alert? No  Use of a cane, walker or w/c? No    TIMED UP AND GO: Was the test performed? No . Virtual visit.   Cognitive Function:     6CIT Screen 04/06/2020  What Year? 0 points  What month? 0 points  What time? 0 points  Count back from 20 0 points  Months in reverse 0 points    Immunizations Immunization History  Administered Date(s) Administered   Fluad Quad(high Dose 65+) 02/13/2020   Hepatitis B 07/12/2012   Influenza,inj,Quad PF,6+ Mos 01/20/2014, 02/02/2018, 01/30/2019   Influenza-Unspecified 01/13/2012   MMR 07/12/2012   PFIZER(Purple Top)SARS-COV-2 Vaccination 06/02/2019, 06/23/2019, 01/29/2020   Tdap 01/13/2012, 07/12/2012    Pap Smear- plans to call and schedule nurse visit.  PNA vaccine- plans to call the office for nurse visit.   Health Maintenance Health Maintenance  Topic Date Due   HIV Screening  Never done   PAP SMEAR-Modifier  Never done   MAMMOGRAM  02/25/2017   DEXA SCAN  Never done   PNA vac Low Risk Adult (1 of 2 - PCV13) 04/06/2021 (Originally 12/23/2019)   TETANUS/TDAP  07/13/2022   COLONOSCOPY (Pts 45-19yr Insurance coverage will need to be confirmed)  07/30/2029   INFLUENZA VACCINE  Completed   COVID-19 Vaccine  Completed   Hepatitis C Screening  Completed   Colorectal cancer screening: Type of screening: Colonoscopy. Completed 07/31/19. Repeat  every 10 years  Mammogram status: Completed 11/27/19. Repeat every year.  Bone density- ordered with [place] per patient consent.   Pap Smear- plans to schedule.   HIV screening- labs followed by pcp.  Lung Cancer Screening: (Low Dose CT Chest recommended if Age 45-80 years, 30 pack-year currently smoking OR have quit w/in 15years.) does not qualify.    Hepatitis C Screening: Completed 09/10/14.   Vision Screening: Recommended annual ophthalmology exams for early detection of glaucoma and other disorders of the eye. Is the patient up to date with their annual eye exam?  Yes  Who is the provider or what is the name of the office in which the patient attends annual eye exams? Dr. Cephus Shelling.  Dental Screening: Recommended annual dental exams for proper oral hygiene.   Community Resource Referral / Chronic Care Management: CRR required this visit?  No   CCM required this visit?  No      Plan:   Keep all routine maintenance appointments.   I have personally reviewed and noted the following in the patient's chart:   Medical and social history Use of alcohol, tobacco or illicit drugs  Current medications and supplements Functional ability and status Nutritional status Physical activity Advanced directives List of other physicians Hospitalizations, surgeries, and ER visits in previous 12 months Vitals Screenings to include cognitive, depression, and falls Referrals and appointments  In addition, I have reviewed and discussed with patient certain preventive protocols, quality metrics, and best practice recommendations. A written personalized care plan for preventive services as well as general preventive health recommendations were provided to patient via mychart.     OBrien-Blaney, Jaria Conway L, LPN   6/43/1427    I have reviewed the above information and agree with above.   Deborra Medina, MD

## 2020-04-15 ENCOUNTER — Encounter: Payer: Self-pay | Admitting: Internal Medicine

## 2020-04-24 ENCOUNTER — Other Ambulatory Visit: Payer: Self-pay | Admitting: Internal Medicine

## 2020-05-28 ENCOUNTER — Other Ambulatory Visit: Payer: Self-pay

## 2020-05-28 ENCOUNTER — Encounter: Payer: Self-pay | Admitting: Internal Medicine

## 2020-05-28 ENCOUNTER — Ambulatory Visit (INDEPENDENT_AMBULATORY_CARE_PROVIDER_SITE_OTHER): Payer: Medicare Other

## 2020-05-28 ENCOUNTER — Ambulatory Visit (INDEPENDENT_AMBULATORY_CARE_PROVIDER_SITE_OTHER): Payer: Medicare Other | Admitting: Internal Medicine

## 2020-05-28 VITALS — BP 150/98 | HR 73 | Temp 97.7°F | Ht 65.0 in | Wt 169.6 lb

## 2020-05-28 DIAGNOSIS — G8929 Other chronic pain: Secondary | ICD-10-CM | POA: Diagnosis not present

## 2020-05-28 DIAGNOSIS — N3000 Acute cystitis without hematuria: Secondary | ICD-10-CM | POA: Diagnosis not present

## 2020-05-28 DIAGNOSIS — M546 Pain in thoracic spine: Secondary | ICD-10-CM | POA: Diagnosis not present

## 2020-05-28 DIAGNOSIS — E2839 Other primary ovarian failure: Secondary | ICD-10-CM

## 2020-05-28 DIAGNOSIS — M1990 Unspecified osteoarthritis, unspecified site: Secondary | ICD-10-CM

## 2020-05-28 DIAGNOSIS — R1011 Right upper quadrant pain: Secondary | ICD-10-CM

## 2020-05-28 DIAGNOSIS — M549 Dorsalgia, unspecified: Secondary | ICD-10-CM

## 2020-05-28 DIAGNOSIS — R0781 Pleurodynia: Secondary | ICD-10-CM | POA: Diagnosis not present

## 2020-05-28 DIAGNOSIS — R112 Nausea with vomiting, unspecified: Secondary | ICD-10-CM | POA: Diagnosis not present

## 2020-05-28 DIAGNOSIS — S32000D Wedge compression fracture of unspecified lumbar vertebra, subsequent encounter for fracture with routine healing: Secondary | ICD-10-CM

## 2020-05-28 DIAGNOSIS — R7303 Prediabetes: Secondary | ICD-10-CM

## 2020-05-28 DIAGNOSIS — Z9884 Bariatric surgery status: Secondary | ICD-10-CM

## 2020-05-28 DIAGNOSIS — R1310 Dysphagia, unspecified: Secondary | ICD-10-CM | POA: Diagnosis not present

## 2020-05-28 DIAGNOSIS — Z1231 Encounter for screening mammogram for malignant neoplasm of breast: Secondary | ICD-10-CM | POA: Diagnosis not present

## 2020-05-28 LAB — D-DIMER, QUANTITATIVE: D-Dimer, Quant: 0.36 mcg/mL FEU (ref <0.50)

## 2020-05-28 MED ORDER — ONDANSETRON HCL 4 MG PO TABS: 4.0000 mg | ORAL_TABLET | Freq: Three times a day (TID) | ORAL | 0 refills | Status: DC | PRN

## 2020-05-28 MED ORDER — TRAMADOL HCL 50 MG PO TABS: 50.0000 mg | ORAL_TABLET | Freq: Two times a day (BID) | ORAL | 0 refills | Status: DC | PRN

## 2020-05-28 NOTE — Progress Notes (Addendum)
Chief Complaint  Patient presents with  . Back Pain   Multiple complaints  1. RUQ pain through back ongoing x weeks stabbing 7/10 wakes up from sleep duration x several weeks tried tylenol, advil though h/o gastric bypass in 02/2012 and h/o adhesions she also does have n/v at times and at times dysphagia will refer to GI Dr. Adela Lank h/o dysphagia with EGD with dilation multiple x in the past  H/o gastric bypass  2. H/o back pain since 10/2018 with wearing brace x 3 months 3. BP elevated today   Review of Systems  Constitutional: Negative for weight loss.  HENT: Negative for hearing loss.   Eyes: Negative for blurred vision.  Respiratory: Negative for shortness of breath.   Cardiovascular: Negative for chest pain.  Gastrointestinal: Positive for abdominal pain, nausea and vomiting.  Musculoskeletal: Positive for back pain.  Skin: Negative for rash.  Neurological: Negative for headaches.  Psychiatric/Behavioral: Negative for depression. The patient has insomnia.    Past Medical History:  Diagnosis Date  . Anemia    Low iron and hemoglobin  . Anginal pain ([facility])    states she thinks it is stress  . Anxiety   . Asthma   . Depression   . Dysrhythmia    states she has PVC's  . Fibromyalgia   . Hypertension   . Recurrent vomiting 02/04/2018  . Schatzki's ring    dialated 3 times by Markham Jordan    Past Surgical History:  Procedure Laterality Date  . ABDOMINAL ADHESION SURGERY    . BUNIONECTOMY     left foot   . CERVICAL FUSION    . CESAREAN SECTION    . CHOLECYSTECTOMY  June 2013  . GASTRIC BYPASS     Family History  Problem Relation Age of Onset  . Hypertension Mother   . Glaucoma Mother   . Heart disease Father   . Stroke Father   . Breast cancer Maternal Aunt   . Cancer Maternal Aunt   . Colon cancer Neg Hx   . Ovarian cancer Neg Hx    Social History   Socioeconomic History  . Marital status: Divorced    Spouse name: Not on file  . Number of children: Not on  file  . Years of education: Not on file  . Highest education level: Not on file  Occupational History  . Not on file  Tobacco Use  . Smoking status: Never Smoker  . Smokeless tobacco: Never Used  Substance and Sexual Activity  . Alcohol use: Yes    Alcohol/week: 3.0 standard drinks    Types: 3 Glasses of wine per week    Comment: once weekly  . Drug use: No  . Sexual activity: Yes  Other Topics Concern  . Not on file  Social History Narrative   Lives with spouse, son, and stepson who has serious psychiatric issues and alcohol abuse   Social Determinants of Health   Financial Resource Strain: Low Risk   . Difficulty of Paying Living Expenses: Not hard at all  Food Insecurity: No Food Insecurity  . Worried About Programme researcher, broadcasting/film/video in the Last Year: Never true  . Ran Out of Food in the Last Year: Never true  Transportation Needs: No Transportation Needs  . Lack of Transportation (Medical): No  . Lack of Transportation (Non-Medical): No  Physical Activity: Not on file  Stress: No Stress Concern Present  . Feeling of Stress : Not at all  Social Connections: Unknown  .  Frequency of Communication with Friends and Family: More than three times a week  . Frequency of Social Gatherings with Friends and Family: More than three times a week  . Attends Religious Services: Not on file  . Active Member of Clubs or Organizations: Not on file  . Attends Banker Meetings: Not on file  . Marital Status: Not on file  Intimate Partner Violence: Not At Risk  . Fear of Current or Ex-Partner: No  . Emotionally Abused: No  . Physically Abused: No  . Sexually Abused: No   Current Meds  Medication Sig  . acyclovir (ZOVIRAX) 400 MG tablet Take 1 tablet (400 mg total) by mouth 5 (five) times daily.  . calcium carbonate (OS-CAL) 600 MG TABS Take 600 mg by mouth 2 (two) times daily with a meal.  . escitalopram (LEXAPRO) 20 MG tablet Take 20 mg by mouth daily.   Marland Kitchen LORazepam (ATIVAN)  1 MG tablet TAKE 1 TABLET BY MOUTH TWICE A DAY AS DIRECTED FOR ANXIETY/PANIC  . metoprolol succinate (TOPROL-XL) 50 MG 24 hr tablet TAKE 1 TABLET BY MOUTH EVERY DAY WITH OR IMMEDIATELY FOLLOWING A MEAL  . Multiple Vitamin (MULTIVITAMIN) tablet Take 1 tablet by mouth daily. Bariatric Vitamin  . nortriptyline (PAMELOR) 10 MG capsule Take 20 mg by mouth at bedtime.  . ondansetron (ZOFRAN) 4 MG tablet Take 1 tablet (4 mg total) by mouth every 8 (eight) hours as needed for nausea or vomiting.  . pantoprazole (PROTONIX) 40 MG tablet TAKE 1 TABLET BY MOUTH EVERY DAY  . zolpidem (AMBIEN) 10 MG tablet Take 10 mg by mouth at bedtime as needed for sleep.   Allergies  Allergen Reactions  . Bacitracin-Polymyxin B Rash  . Eggs Or Egg-Derived Products   . Betadine [Povidone Iodine] Rash   Recent Results (from the past 2160 hour(s))  Comprehensive metabolic panel     Status: None   Collection Time: 05/28/20  2:08 PM  Result Value Ref Range   Sodium 140 135 - 145 mEq/L   Potassium 3.8 3.5 - 5.1 mEq/L   Chloride 106 96 - 112 mEq/L   CO2 25 19 - 32 mEq/L   Glucose, Bld 86 70 - 99 mg/dL   BUN 9 6 - 23 mg/dL   Creatinine, Ser 4.31 0.40 - 1.20 mg/dL   Total Bilirubin 0.5 0.2 - 1.2 mg/dL   Alkaline Phosphatase 76 39 - 117 U/L   AST 17 0 - 37 U/L   ALT 10 0 - 35 U/L   Total Protein 6.7 6.0 - 8.3 g/dL   Albumin 3.7 3.5 - 5.2 g/dL   GFR 54.00 >86.76 mL/min    Comment: Calculated using the CKD-EPI Creatinine Equation (2021)   Calcium 9.0 8.4 - 10.5 mg/dL  CBC with Differential/Platelet     Status: Abnormal   Collection Time: 05/28/20  2:08 PM  Result Value Ref Range   WBC 6.4 4.0 - 10.5 K/uL   RBC 4.13 3.87 - 5.11 Mil/uL   Hemoglobin 11.7 (L) 12.0 - 15.0 g/dL   HCT 19.5 (L) 09.3 - 26.7 %   MCV 86.0 78.0 - 100.0 fl   MCHC 32.9 30.0 - 36.0 g/dL   RDW 12.4 58.0 - 99.8 %   Platelets 232.0 150.0 - 400.0 K/uL   Neutrophils Relative % 55.3 43.0 - 77.0 %   Lymphocytes Relative 31.6 12.0 - 46.0 %    Monocytes Relative 7.1 3.0 - 12.0 %   Eosinophils Relative 5.0 0.0 - 5.0 %  Basophils Relative 1.0 0.0 - 3.0 %   Neutro Abs 3.5 1.4 - 7.7 K/uL   Lymphs Abs 2.0 0.7 - 4.0 K/uL   Monocytes Absolute 0.5 0.1 - 1.0 K/uL   Eosinophils Absolute 0.3 0.0 - 0.7 K/uL   Basophils Absolute 0.1 0.0 - 0.1 K/uL  Urinalysis, Routine w reflex microscopic     Status: Abnormal   Collection Time: 05/28/20  2:08 PM  Result Value Ref Range   Specific Gravity, UA 1.016 1.005 - 1.030   pH, UA 5.5 5.0 - 7.5   Color, UA Yellow Yellow   Appearance Ur Clear Clear   Leukocytes,UA 2+ (A) Negative   Protein,UA Negative Negative/Trace   Glucose, UA Negative Negative   Ketones, UA Negative Negative   RBC, UA Negative Negative   Bilirubin, UA Negative Negative   Urobilinogen, Ur 0.2 0.2 - 1.0 mg/dL   Nitrite, UA Negative Negative   Microscopic Examination See below:     Comment: Microscopic was indicated and was performed.  Urine Culture     Status: None   Collection Time: 05/28/20  2:08 PM   Specimen: Blood   BLD  Result Value Ref Range   Urine Culture, Routine Final report    Organism ID, Bacteria Comment     Comment: Mixed urogenital flora Less than 10,000 colonies/mL   Hemoglobin A1c     Status: None   Collection Time: 05/28/20  2:08 PM  Result Value Ref Range   Hgb A1c MFr Bld 5.9 4.6 - 6.5 %    Comment: Glycemic Control Guidelines for People with Diabetes:Non Diabetic:  <6%Goal of Therapy: <7%Additional Action Suggested:  >8%   Microscopic Examination     Status: Abnormal   Collection Time: 05/28/20  2:08 PM   BLD  Result Value Ref Range   WBC, UA 11-30 (A) 0 - 5 /hpf   RBC 0-2 0 - 2 /hpf   Epithelial Cells (non renal) 0-10 0 - 10 /hpf   Casts None seen None seen /lpf   Bacteria, UA None seen None seen/Few  D-Dimer, Quantitative     Status: None   Collection Time: 05/28/20  2:21 PM  Result Value Ref Range   D-Dimer, Quant 0.36 <0.50 mcg/mL FEU    Comment: . The D-Dimer test is used  frequently to exclude an acute PE or DVT. In patients with a low to moderate clinical risk assessment and a D-Dimer result <0.50 mcg/mL FEU, the likelihood of a PE or DVT is very low. However, a thromboembolic event should not be excluded solely on the basis of the D-Dimer level. Increased levels of D-Dimer are associated with a PE, DVT, DIC, malignancies, inflammation, sepsis, surgery, trauma, pregnancy, and advancing patient age. [Jama 2006 11:295(2):199-207] . For additional information, please refer to: http://education.quest[facility].com/faq/FAQ149 (This link is being provided for informational/ educational purposes only) .    Objective  Body mass index is 28.22 kg/m. Wt Readings from Last 3 Encounters:  05/28/20 169 lb 9.6 oz (76.9 kg)  04/06/20 166 lb (75.3 kg)  02/13/20 166 lb (75.3 kg)   Temp Readings from Last 3 Encounters:  05/28/20 97.7 F (36.5 C) (Oral)  02/13/20 98.2 F (36.8 C) (Oral)  01/10/20 98.3 F (36.8 C) (Oral)   BP Readings from Last 3 Encounters:  05/28/20 (!) 150/98  02/13/20 140/90  01/10/20 132/84   Pulse Readings from Last 3 Encounters:  05/28/20 73  02/13/20 85  01/10/20 80    Physical Exam Vitals and nursing note reviewed.  Constitutional:      Appearance: Normal appearance. She is well-developed, well-groomed and overweight.  HENT:     Head: Normocephalic and atraumatic.  Cardiovascular:     Rate and Rhythm: Normal rate and regular rhythm.     Heart sounds: Normal heart sounds. No murmur heard.   Pulmonary:     Effort: Pulmonary effort is normal.     Breath sounds: Normal breath sounds.  Abdominal:     Tenderness: There is abdominal tenderness in the right upper quadrant and right lower quadrant.  Skin:    General: Skin is warm and dry.  Neurological:     General: No focal deficit present.     Mental Status: She is alert and oriented to person, place, and time. Mental status is at baseline.     Gait: Gait normal.   Psychiatric:        Attention and Perception: Attention and perception normal.        Mood and Affect: Mood and affect normal.        Speech: Speech normal.        Behavior: Behavior normal. Behavior is cooperative.        Thought Content: Thought content normal.        Cognition and Memory: Cognition and memory normal.        Judgment: Judgment normal.     Assessment  Plan  Right upper quadrant abdominal pain r/o GI etiology - Plan: Comprehensive metabolic panel, CBC with Differential/Platelet, Urinalysis, Routine w reflex microscopic, Urine Culture, CT Abdomen Pelvis W Contrast  Mid back pain d dimer negative - Plan: DG Thoracic Spine 2 View, traMADol (ULTRAM) 50 MG tablet, CT Abdomen Pelvis W Contrast IMPRESSION: Degenerative changes in the thoracic spine. No other thoracic spine abnormalities. Electronically Signed   By: Gerome Samavid  Williams III M.D   On: 05/30/2020 21:21  Dysphagia, unspecified type - Plan: Ambulatory referral to Gastroenterology  S/P gastric bypass - Plan: Ambulatory referral to Gastroenterology, CT Abdomen Pelvis W Contrast  Nausea and vomiting, intractability of vomiting not specified, unspecified vomiting type - Plan: Ambulatory referral to Gastroenterology, ondansetron (ZOFRAN) 4 MG tablet, CT Abdomen Pelvis W Contrast  Prediabetes - Plan: Hemoglobin A1c  Acute cystitis without hematuria - Plan: Urinalysis, Routine w reflex microscopic, Urine Culture, CT Abdomen Pelvis W Contrast  Arthritis - Plan: traMADol (ULTRAM) 50 MG tablet Other chronic pain - Plan: traMADol (ULTRAM) 50 MG tablet  Compression fracture of lumbar vertebra with routine healing, unspecified lumbar vertebral level, subsequent encounter - Plan: DG Bone Density  Estrogen deficiency - Plan: DG Bone Density Referred mammogram   HM F/u with PCP Provider: Dr. French Anaracy McLean-Scocuzza-Internal Medicine

## 2020-05-29 ENCOUNTER — Telehealth: Payer: Self-pay | Admitting: Internal Medicine

## 2020-05-29 LAB — CBC WITH DIFFERENTIAL/PLATELET
Basophils Absolute: 0.1 10*3/uL (ref 0.0–0.1)
Basophils Relative: 1 % (ref 0.0–3.0)
Eosinophils Absolute: 0.3 10*3/uL (ref 0.0–0.7)
Eosinophils Relative: 5 % (ref 0.0–5.0)
HCT: 35.5 % — ABNORMAL LOW (ref 36.0–46.0)
Hemoglobin: 11.7 g/dL — ABNORMAL LOW (ref 12.0–15.0)
Lymphocytes Relative: 31.6 % (ref 12.0–46.0)
Lymphs Abs: 2 10*3/uL (ref 0.7–4.0)
MCHC: 32.9 g/dL (ref 30.0–36.0)
MCV: 86 fl (ref 78.0–100.0)
Monocytes Absolute: 0.5 10*3/uL (ref 0.1–1.0)
Monocytes Relative: 7.1 % (ref 3.0–12.0)
Neutro Abs: 3.5 10*3/uL (ref 1.4–7.7)
Neutrophils Relative %: 55.3 % (ref 43.0–77.0)
Platelets: 232 10*3/uL (ref 150.0–400.0)
RBC: 4.13 Mil/uL (ref 3.87–5.11)
RDW: 13.2 % (ref 11.5–15.5)
WBC: 6.4 10*3/uL (ref 4.0–10.5)

## 2020-05-29 LAB — COMPREHENSIVE METABOLIC PANEL
ALT: 10 U/L (ref 0–35)
AST: 17 U/L (ref 0–37)
Albumin: 3.7 g/dL (ref 3.5–5.2)
Alkaline Phosphatase: 76 U/L (ref 39–117)
BUN: 9 mg/dL (ref 6–23)
CO2: 25 mEq/L (ref 19–32)
Calcium: 9 mg/dL (ref 8.4–10.5)
Chloride: 106 mEq/L (ref 96–112)
Creatinine, Ser: 0.69 mg/dL (ref 0.40–1.20)
GFR: 91.07 mL/min (ref >60.00)
Glucose, Bld: 86 mg/dL (ref 70–99)
Potassium: 3.8 mEq/L (ref 3.5–5.1)
Sodium: 140 mEq/L (ref 135–145)
Total Bilirubin: 0.5 mg/dL (ref 0.2–1.2)
Total Protein: 6.7 g/dL (ref 6.0–8.3)

## 2020-05-29 LAB — URINALYSIS, ROUTINE W REFLEX MICROSCOPIC
Bilirubin, UA: NEGATIVE
Glucose, UA: NEGATIVE
Ketones, UA: NEGATIVE
Nitrite, UA: NEGATIVE
Protein,UA: NEGATIVE
RBC, UA: NEGATIVE
Specific Gravity, UA: 1.016 (ref 1.005–1.030)
Urobilinogen, Ur: 0.2 mg/dL (ref 0.2–1.0)
pH, UA: 5.5 (ref 5.0–7.5)

## 2020-05-29 LAB — MICROSCOPIC EXAMINATION
Bacteria, UA: NONE SEEN
Casts: NONE SEEN /lpf

## 2020-05-29 LAB — HEMOGLOBIN A1C: Hgb A1c MFr Bld: 5.9 % (ref 4.6–6.5)

## 2020-05-29 NOTE — Telephone Encounter (Signed)
Patient called in wanted someone to call her to explain her lab results from her urine sample

## 2020-05-29 NOTE — Telephone Encounter (Signed)
Please advise, labs have been resulted in chart

## 2020-05-29 NOTE — Telephone Encounter (Signed)
Patient has been informed.

## 2020-05-29 NOTE — Telephone Encounter (Signed)
Urine is still pending im still waiting on urine culture will be back Monday or tomorrow

## 2020-05-30 LAB — URINE CULTURE

## 2020-06-15 ENCOUNTER — Ambulatory Visit: Payer: Medicare Other

## 2020-06-24 ENCOUNTER — Other Ambulatory Visit: Payer: Self-pay | Admitting: Internal Medicine

## 2020-06-24 DIAGNOSIS — M549 Dorsalgia, unspecified: Secondary | ICD-10-CM

## 2020-06-24 DIAGNOSIS — G8929 Other chronic pain: Secondary | ICD-10-CM

## 2020-06-24 DIAGNOSIS — M1990 Unspecified osteoarthritis, unspecified site: Secondary | ICD-10-CM

## 2020-06-24 NOTE — Telephone Encounter (Signed)
RX Refill:tramadol Last Seen:05-28-20 Last ordered:05-28-20

## 2020-06-24 NOTE — Telephone Encounter (Signed)
Denied. Patient has NOT been seen by me since may of last year.

## 2020-06-25 ENCOUNTER — Other Ambulatory Visit: Payer: Self-pay | Admitting: Internal Medicine

## 2020-06-25 DIAGNOSIS — M549 Dorsalgia, unspecified: Secondary | ICD-10-CM

## 2020-06-25 DIAGNOSIS — G8929 Other chronic pain: Secondary | ICD-10-CM

## 2020-06-25 DIAGNOSIS — M1990 Unspecified osteoarthritis, unspecified site: Secondary | ICD-10-CM

## 2020-06-29 ENCOUNTER — Ambulatory Visit
Admission: RE | Admit: 2020-06-29 | Discharge: 2020-06-29 | Disposition: A | Discharge status: Home / Self Care | Payer: Medicare Other | Source: Ambulatory Visit | Attending: Internal Medicine | Admitting: Internal Medicine

## 2020-06-29 ENCOUNTER — Other Ambulatory Visit: Payer: Self-pay

## 2020-06-29 DIAGNOSIS — R1011 Right upper quadrant pain: Secondary | ICD-10-CM | POA: Insufficient documentation

## 2020-06-29 DIAGNOSIS — Z9884 Bariatric surgery status: Secondary | ICD-10-CM | POA: Diagnosis not present

## 2020-06-29 DIAGNOSIS — N3000 Acute cystitis without hematuria: Secondary | ICD-10-CM | POA: Insufficient documentation

## 2020-06-29 DIAGNOSIS — M549 Dorsalgia, unspecified: Secondary | ICD-10-CM | POA: Insufficient documentation

## 2020-06-29 DIAGNOSIS — Z9049 Acquired absence of other specified parts of digestive tract: Secondary | ICD-10-CM | POA: Diagnosis not present

## 2020-06-29 DIAGNOSIS — R110 Nausea: Secondary | ICD-10-CM | POA: Diagnosis not present

## 2020-06-29 DIAGNOSIS — I700 Atherosclerosis of aorta: Secondary | ICD-10-CM | POA: Diagnosis not present

## 2020-06-29 DIAGNOSIS — R112 Nausea with vomiting, unspecified: Secondary | ICD-10-CM

## 2020-06-29 MED ORDER — IOHEXOL 300 MG/ML  SOLN
100.0000 mL | Freq: Once | INTRAMUSCULAR | Status: AC | PRN
  Administered 2020-06-29: 100 mL via INTRAVENOUS

## 2020-06-29 NOTE — Telephone Encounter (Signed)
RX Refill:Tramadol Last Seen:05-28-20 Last ordered:05-28-20

## 2020-06-30 NOTE — Telephone Encounter (Signed)
REFILL DENIED PER CHRONIC CONTROLLED SUBSTANCE CONTRACT.  Last appt with me was May 2021

## 2020-07-01 ENCOUNTER — Other Ambulatory Visit: Payer: Self-pay

## 2020-07-01 ENCOUNTER — Encounter: Payer: Self-pay | Admitting: Internal Medicine

## 2020-07-01 ENCOUNTER — Ambulatory Visit (INDEPENDENT_AMBULATORY_CARE_PROVIDER_SITE_OTHER): Payer: Medicare Other | Admitting: Internal Medicine

## 2020-07-01 VITALS — BP 138/88 | HR 86 | Temp 98.0°F | Resp 15 | Ht 65.0 in | Wt 172.0 lb

## 2020-07-01 DIAGNOSIS — M549 Dorsalgia, unspecified: Secondary | ICD-10-CM | POA: Diagnosis not present

## 2020-07-01 DIAGNOSIS — G8929 Other chronic pain: Secondary | ICD-10-CM

## 2020-07-01 DIAGNOSIS — M1990 Unspecified osteoarthritis, unspecified site: Secondary | ICD-10-CM | POA: Diagnosis not present

## 2020-07-01 DIAGNOSIS — F4325 Adjustment disorder with mixed disturbance of emotions and conduct: Secondary | ICD-10-CM

## 2020-07-01 DIAGNOSIS — R7989 Other specified abnormal findings of blood chemistry: Secondary | ICD-10-CM | POA: Diagnosis not present

## 2020-07-01 DIAGNOSIS — R079 Chest pain, unspecified: Secondary | ICD-10-CM

## 2020-07-01 DIAGNOSIS — D649 Anemia, unspecified: Secondary | ICD-10-CM | POA: Diagnosis not present

## 2020-07-01 DIAGNOSIS — M4724 Other spondylosis with radiculopathy, thoracic region: Secondary | ICD-10-CM

## 2020-07-01 DIAGNOSIS — R7303 Prediabetes: Secondary | ICD-10-CM | POA: Diagnosis not present

## 2020-07-01 DIAGNOSIS — F409 Phobic anxiety disorder, unspecified: Secondary | ICD-10-CM

## 2020-07-01 DIAGNOSIS — R1011 Right upper quadrant pain: Secondary | ICD-10-CM | POA: Diagnosis not present

## 2020-07-01 DIAGNOSIS — F5105 Insomnia due to other mental disorder: Secondary | ICD-10-CM

## 2020-07-01 MED ORDER — TRAMADOL HCL 50 MG PO TABS: 50.0000 mg | ORAL_TABLET | Freq: Two times a day (BID) | ORAL | 0 refills | Status: DC | PRN

## 2020-07-01 MED ORDER — ATORVASTATIN CALCIUM 20 MG PO TABS: 20.0000 mg | ORAL_TABLET | Freq: Every day | ORAL | 3 refills | Status: DC

## 2020-07-01 NOTE — Patient Instructions (Addendum)
I recommend starting atorvastatin once daily as primary prevention given the aortic atherosclerosis  Noted on your recent CT   Please return in 3 weeks for check of liver enzymes  Referral [facility] Cardiology in progress    For Gas pains:  Try Gas X ,  phasyzme  Try taking Beano with any meal containing  Vegetables to prevent gas pain     For the constipation:  Try using colace,  The stool softener,  Glycerin suppositories or milk of magnesium   CLEAN UP YOUR DIET   Health[employer] Choice low Carb Power Bowls, [employer].   Referral to [facility] Cardiology

## 2020-07-01 NOTE — Progress Notes (Addendum)
Subjective:  Patient ID: Sherri Lee, female    DOB: 15-Feb-1956  Age: 65 y.o. MRN: [id number]  CC: The primary encounter diagnosis was Chest pain, unspecified type. Diagnoses of Mid back pain, Arthritis, Other chronic pain, RUQ pain, Osteoarthritis of spine with radiculopathy, thoracic region, Insomnia due to anxiety and fear, Adjustment disorder with mixed disturbance of emotions and conduct, Abnormal thyroid blood test, Normocytic anemia, and Prediabetes were also pertinent to this visit.  HPI Sherri Lee presents for follow up on chronic issues including chronic low back pain, GAD/depression, and overweight. Last seen may 2020   This visit occurred during the SARS-CoV-2 public health emergency.  Safety protocols were in place, including screening questions prior to the visit, additional usage of staff PPE, and extensive cleaning of exam room while observing appropriate contact time as indicated for disinfecting solutions.   Seen by TMS march 17 for 2-3 week history of RUQ pain  and nausea..  She has a past surgical History of gastric bypass and cholecystectomy .  CT abd pelvis was normal except for aortic atherosclerosis.  Labs :  Normal CBC, D Dimer.  Normal UA/culture.  a1c 5.9 .  CMET normal  History of pain:  She describes the pain as a cramping,  RUQ location  that often radiates to her back on the same side.  It has occurred on a daily basis  At times would last all day , but would wax and wane  In terms of severity.     At times the pain would start in the the Substernal area.   Not sure if it is related to eating.  still happening "some".  Notes that she has become constipated  And is eating more due to stress:    GAD:  FATHER HAD A STROKE,  She is now primary caregiver of both,  MOTHER is deaf and visually impaired . Marland Kitchen. Trying to resell home has moved  in with parents.  Has gained 25 lbs (147 to 172) . On a happier note she reports that her drug addicted son  is doing well. Has been Clean for 2 years come next month with the help of the [facility],  Studying to become a minister   Used to IBS diarrhea,  More of late has been having constipation   Chronic back/joint pain:  Her pain has been historically managed with tramadol due to her history of  gastric bypass surgery making chronic NSAIDs C/I.    Outpatient Medications Prior to Visit  Medication Sig Dispense Refill  . acyclovir (ZOVIRAX) 400 MG tablet Take 1 tablet (400 mg total) by mouth 5 (five) times daily. 35 tablet 3  . calcium carbonate (OS-CAL) 600 MG TABS Take 600 mg by mouth 2 (two) times daily with a meal.    . conjugated estrogens (PREMARIN) vaginal cream Place 1 Applicatorful vaginally every 30 (thirty) days. 2-3 x per month    . escitalopram (LEXAPRO) 20 MG tablet Take 20 mg by mouth daily.     Marland Kitchen. LORazepam (ATIVAN) 1 MG tablet TAKE 1 TABLET BY MOUTH TWICE A DAY AS DIRECTED FOR ANXIETY/PANIC  2  . metoprolol succinate (TOPROL-XL) 50 MG 24 hr tablet TAKE 1 TABLET BY MOUTH EVERY DAY WITH OR IMMEDIATELY FOLLOWING A MEAL 90 tablet 1  . Multiple Vitamin (MULTIVITAMIN) tablet Take 1 tablet by mouth daily. Bariatric Vitamin    . ondansetron (ZOFRAN) 4 MG tablet Take 1 tablet (4 mg total) by mouth every 8 (eight)  hours as needed for nausea or vomiting. 40 tablet 0  . pantoprazole (PROTONIX) 40 MG tablet TAKE 1 TABLET BY MOUTH EVERY DAY 90 tablet 1  . zolpidem (AMBIEN) 10 MG tablet Take 10 mg by mouth at bedtime as needed for sleep.    Marland Kitchen gabapentin (NEURONTIN) 100 MG capsule LIMIT 1-3 CAPSULES BY MOUTH PER DAY IF TOLERATED (Patient not taking: No sig reported) 270 capsule 1  . nortriptyline (PAMELOR) 10 MG capsule Take 20 mg by mouth at bedtime. (Patient not taking: Reported on 07/01/2020)    . traMADol (ULTRAM) 50 MG tablet Take 1 tablet (50 mg total) by mouth 2 (two) times daily as needed. (Patient not taking: Reported on 07/01/2020) 60 tablet 0   No facility-administered  medications prior to visit.    Review of Systems;  Patient denies headache, fevers, malaise, unintentional weight loss, skin rash, eye pain, sinus congestion and sinus pain, sore throat, dysphagia,  hemoptysis , cough, dyspnea, wheezing, chest pain, palpitations, orthopnea, edema, abdominal pain, nausea, melena, diarrhea, constipation, flank pain, dysuria, hematuria, urinary  Frequency, nocturia, numbness, tingling, seizures,  Focal weakness, Loss of consciousness,  Tremor, insomnia, depression, anxiety, and suicidal ideation.      Objective:  BP 138/88 (BP Location: Left Arm, Patient Position: Sitting, Cuff Size: Large)   Pulse 86   Temp 98 F (36.7 C) (Oral)   Resp 15   Ht 5\' 5"  (1.651 m)   Wt 172 lb (78 kg)   SpO2 98%   BMI 28.62 kg/m   BP Readings from Last 3 Encounters:  07/01/20 138/88  05/28/20 (!) 150/98  02/13/20 140/90    Wt Readings from Last 3 Encounters:  07/01/20 172 lb (78 kg)  05/28/20 169 lb 9.6 oz (76.9 kg)  04/06/20 166 lb (75.3 kg)    General appearance: alert, cooperative and appears stated age Ears: normal TM's and external ear canals both ears Throat: lips, mucosa, and tongue normal; teeth and gums normal Neck: no adenopathy, no carotid bruit, supple, symmetrical, trachea midline and thyroid not enlarged, symmetric, no tenderness/mass/nodules Back: symmetric, no curvature. ROM normal. No CVA tenderness. Lungs: clear to auscultation bilaterally Heart: regular rate and rhythm, S1, S2 normal, no murmur, click, rub or gallop Abdomen: soft, non-tender; bowel sounds normal; no masses,  no organomegaly Pulses: 2+ and symmetric Skin: Skin color, texture, turgor normal. No rashes or lesions Lymph nodes: Cervical, supraclavicular, and axillary nodes normal.  Lab Results  Component Value Date   HGBA1C 5.9 05/28/2020   HGBA1C 5.7 07/29/2019   HGBA1C 5.2 02/02/2018    Lab Results  Component Value Date   CREATININE 0.69 05/28/2020   CREATININE 0.68  07/29/2019   CREATININE 0.64 11/15/2018    Lab Results  Component Value Date   WBC 6.4 05/28/2020   HGB 11.7 (L) 05/28/2020   HCT 35.5 (L) 05/28/2020   PLT 232.0 05/28/2020   GLUCOSE 86 05/28/2020   CHOL 174 07/01/2020   TRIG 94.0 07/01/2020   HDL 77.80 07/01/2020   LDLDIRECT 99.0 09/04/2015   LDLCALC 77 07/01/2020   ALT 10 05/28/2020   AST 17 05/28/2020   NA 140 05/28/2020   K 3.8 05/28/2020   CL 106 05/28/2020   CREATININE 0.69 05/28/2020   BUN 9 05/28/2020   CO2 25 05/28/2020   TSH 0.59 07/29/2019   HGBA1C 5.9 05/28/2020   MICROALBUR 0.6 09/04/2015    CT Abdomen Pelvis W Contrast  Result Date: 06/30/2020 CLINICAL DATA:  Right upper quadrant pain for 2-3 weeks.  Nausea. History of gastric bypass surgery and cholecystectomy. EXAM: CT ABDOMEN AND PELVIS WITH CONTRAST TECHNIQUE: Multidetector CT imaging of the abdomen and pelvis was performed using the standard protocol following bolus administration of intravenous contrast. CONTRAST:  OMNIPAQUE IOHEXOL 300 MG/ML  SOLN COMPARISON:  CT 04/26/2013 FINDINGS: Lower chest: Included lung bases are clear.  Heart size is normal. Hepatobiliary: No focal liver abnormality is seen. Status post cholecystectomy. No biliary dilatation. Pancreas: Unremarkable. No pancreatic ductal dilatation or surrounding inflammatory changes. Spleen: Normal in size without focal abnormality. Adrenals/Urinary Tract: Unremarkable adrenal glands. Kidneys enhance symmetrically without focal lesion, stone, or hydronephrosis. Ureters are nondilated. Urinary bladder appears unremarkable for the degree of distension. Stomach/Bowel: Postsurgical changes from gastric bypass. Oral contrast is present throughout the bowel. No evidence of obstruction. No focal bowel wall thickening or inflammatory changes. Normal retrocecal appendix (series 4, image 78). Vascular/Lymphatic: Scattered aortoiliac atherosclerotic calcifications without aneurysm. Retroaortic left renal vein. No  abdominopelvic lymphadenopathy. Reproductive: Uterus and bilateral adnexa are unremarkable. Other: No free fluid. No abdominopelvic fluid collection. No pneumoperitoneum. No abdominal wall hernia. Musculoskeletal: No acute or significant osseous findings. IMPRESSION: 1. No acute abdominopelvic findings. 2. Postsurgical changes from gastric bypass and cholecystectomy. 3. Aortic atherosclerosis (ICD10-I70.0). Electronically Signed   By: Duanne Guess D.O.   On: 06/30/2020 10:19    Assessment & Plan:   Problem List Items Addressed This Visit      Unprioritized   RUQ pain    Etiology unclear.  CT reviewed.  She is s/p cholecystectomy and gastric bypass.   Given the occasional occurrence of SSCP and the presence of atherosclerosis on CT ,  Will need cardiology evaluation. I have ordered and reviewed a 12 lead EKG and find that there are no acute changes and patient is in sinus rhythm.    Also advised to treat constipation and reduce overeating/ portion size as the pain may be coming from stretching of stomach       Prediabetes    Secondary to weight regain and lack of exercise. I have addressed  BMI and recommended a low glycemic index diet utilizing smaller more frequent meals to increase metabolism and prevent enlargement of stomach .  I have also recommended that patient start exercising with a goal of 30 minutes of walking a minimum of 5 days per week.   Lab Results  Component Value Date   HGBA1C 5.9 05/28/2020         Normocytic anemia    Intermittent,  Very mild. Last iron screen was normal In May 2021 .  She is due for colonoscopy and has been referred to GI.   Lab Results  Component Value Date   WBC 6.4 05/28/2020   HGB 11.7 (L) 05/28/2020   HCT 35.5 (L) 05/28/2020   MCV 86.0 05/28/2020   PLT 232.0 05/28/2020   Lab Results  Component Value Date   IRON 50 07/29/2019   TIBC 332 07/29/2019   FERRITIN 16 07/29/2019        Insomnia due to anxiety and fear    Managed by  psychiatry since her overdose in 2019      Degenerative joint disease of thoracic spine    Tramadol refilled.  encouraged to consider PT or resume exercise to strengthen paraspinus muscles      Relevant Medications   traMADol (ULTRAM) 50 MG tablet   Adjustment disorder with mixed disturbance of emotions and conduct    Improved with cessation of son's ongoing  drug abuse and  recurrent arrests.  Fortunately her son is now doing well,  But she is now primary caregiver of parents and trying to see her house. Continue lexapro. No medication changes today as she has been under the care of a psychiatrist  .       RESOLVED: Abnormal thyroid blood test      Lab Results  Component Value Date   TSH 0.59 07/29/2019          Other Visit Diagnoses    Chest pain, unspecified type    -  Primary   Relevant Orders   EKG 12-Lead (Completed)   Lipid panel (Completed)   Ambulatory referral to Cardiology   Mid back pain       Relevant Medications   traMADol (ULTRAM) 50 MG tablet   Arthritis       Relevant Medications   traMADol (ULTRAM) 50 MG tablet   Other chronic pain       Relevant Medications   traMADol (ULTRAM) 50 MG tablet      I am having Sherri Lee start on atorvastatin. I am also having her maintain her calcium carbonate, multivitamin, escitalopram, zolpidem, LORazepam, acyclovir, gabapentin, metoprolol succinate, nortriptyline, conjugated estrogens, pantoprazole, ondansetron, and traMADol.  Meds ordered this encounter  Medications  . atorvastatin (LIPITOR) 20 MG tablet    Sig: Take 1 tablet (20 mg total) by mouth daily.    Dispense:  90 tablet    Refill:  3  . traMADol (ULTRAM) 50 MG tablet    Sig: Take 1 tablet (50 mg total) by mouth 2 (two) times daily as needed.    Dispense:  60 tablet    Refill:  0    MAXIMUM 2 DAILY    Medications Discontinued During This Encounter  Medication Reason  . traMADol (ULTRAM) 50 MG tablet Reorder    Follow-up: Return in about  4 weeks (around 07/29/2020).   [name], MD

## 2020-07-01 NOTE — Telephone Encounter (Signed)
Ychart sent to inform patient.

## 2020-07-02 LAB — LIPID PANEL
Cholesterol: 174 mg/dL (ref 0–200)
HDL: 77.8 mg/dL (ref >39.00)
LDL Cholesterol: 77 mg/dL (ref 0–99)
NonHDL: 95.7
Total CHOL/HDL Ratio: 2
Triglycerides: 94 mg/dL (ref 0.0–149.0)
VLDL: 18.8 mg/dL (ref 0.0–40.0)

## 2020-07-03 DIAGNOSIS — R1011 Right upper quadrant pain: Secondary | ICD-10-CM | POA: Insufficient documentation

## 2020-07-03 NOTE — Assessment & Plan Note (Signed)
   Lab Results  Component Value Date   TSH 0.59 07/29/2019

## 2020-07-03 NOTE — Assessment & Plan Note (Addendum)
Intermittent,  Very mild. Last iron screen was normal In May 2021 .  She is due for colonoscopy and has been referred to GI.   Lab Results  Component Value Date   WBC 6.4 05/28/2020   HGB 11.7 (L) 05/28/2020   HCT 35.5 (L) 05/28/2020   MCV 86.0 05/28/2020   PLT 232.0 05/28/2020   Lab Results  Component Value Date   IRON 50 07/29/2019   TIBC 332 07/29/2019   FERRITIN 16 07/29/2019

## 2020-07-03 NOTE — Assessment & Plan Note (Signed)
Secondary to weight regain and lack of exercise. I have addressed  BMI and recommended a low glycemic index diet utilizing smaller more frequent meals to increase metabolism and prevent enlargement of stomach .  I have also recommended that patient start exercising with a goal of 30 minutes of walking a minimum of 5 days per week.   Lab Results  Component Value Date   HGBA1C 5.9 05/28/2020

## 2020-07-03 NOTE — Assessment & Plan Note (Signed)
Improved with cessation of son's ongoing  drug abuse and recurrent arrests.  Fortunately her son is now doing well,  But she is now primary caregiver of parents and trying to see her house. Continue lexapro. No medication changes today as she has been under the care of a psychiatrist  .

## 2020-07-03 NOTE — Assessment & Plan Note (Signed)
Tramadol refilled.  encouraged to consider PT or resume exercise to strengthen paraspinus muscles 

## 2020-07-03 NOTE — Assessment & Plan Note (Addendum)
Etiology unclear.  CT reviewed.  She is s/p cholecystectomy and gastric bypass.   Given the occasional occurrence of SSCP and the presence of atherosclerosis on CT ,  Will need cardiology evaluation. I have ordered and reviewed a 12 lead EKG and find that there are no acute changes and patient is in sinus rhythm.    Also advised to treat constipation and reduce overeating/ portion size as the pain may be coming from stretching of stomach

## 2020-07-03 NOTE — Assessment & Plan Note (Signed)
Managed by psychiatry since her overdose in 2019

## 2020-07-08 ENCOUNTER — Encounter: Payer: Self-pay | Admitting: Internal Medicine

## 2020-07-08 ENCOUNTER — Other Ambulatory Visit: Payer: Self-pay

## 2020-07-08 ENCOUNTER — Telehealth (INDEPENDENT_AMBULATORY_CARE_PROVIDER_SITE_OTHER): Payer: Medicare Other | Admitting: Internal Medicine

## 2020-07-08 VITALS — Ht 65.0 in | Wt 165.0 lb

## 2020-07-08 DIAGNOSIS — R197 Diarrhea, unspecified: Secondary | ICD-10-CM | POA: Diagnosis not present

## 2020-07-08 DIAGNOSIS — J329 Chronic sinusitis, unspecified: Secondary | ICD-10-CM | POA: Diagnosis not present

## 2020-07-08 DIAGNOSIS — J4521 Mild intermittent asthma with (acute) exacerbation: Secondary | ICD-10-CM

## 2020-07-08 DIAGNOSIS — F419 Anxiety disorder, unspecified: Secondary | ICD-10-CM

## 2020-07-08 DIAGNOSIS — U071 COVID-19: Secondary | ICD-10-CM | POA: Diagnosis not present

## 2020-07-08 DIAGNOSIS — R059 Cough, unspecified: Secondary | ICD-10-CM

## 2020-07-08 DIAGNOSIS — R0602 Shortness of breath: Secondary | ICD-10-CM | POA: Diagnosis not present

## 2020-07-08 MED ORDER — SALINE SPRAY 0.65 % NA SOLN: 2.0000 | Freq: Every day | NASAL | 2 refills | Status: DC | PRN

## 2020-07-08 MED ORDER — FLUTICASONE PROPIONATE 50 MCG/ACT NA SUSP: 2.0000 | Freq: Every day | NASAL | 2 refills | Status: DC | PRN

## 2020-07-08 MED ORDER — DM-GUAIFENESIN ER 60-1200 MG PO TB12: 1.0000 | ORAL_TABLET | Freq: Two times a day (BID) | ORAL | 0 refills | Status: DC

## 2020-07-08 MED ORDER — AZITHROMYCIN 250 MG PO TABS: ORAL_TABLET | ORAL | 0 refills | Status: AC

## 2020-07-08 MED ORDER — ALBUTEROL SULFATE HFA 108 (90 BASE) MCG/ACT IN AERS: 2.0000 | INHALATION_SPRAY | Freq: Four times a day (QID) | RESPIRATORY_TRACT | 2 refills | Status: DC | PRN

## 2020-07-08 MED ORDER — PREDNISONE 20 MG PO TABS: 40.0000 mg | ORAL_TABLET | Freq: Every day | ORAL | 0 refills | Status: DC

## 2020-07-08 NOTE — Progress Notes (Signed)
Telephone Note  I connected with Sherri Lee  on 07/08/20 at  1:30 PM EDT by a telephone and verified that I am speaking with the correct person using two identifiers.  Location patient: home, [place] Location provider:work or home office Persons participating in the virtual visit: patient, provider  I discussed the limitations of evaluation and management by telemedicine and the availability of in person appointments. The patient expressed understanding and agreed to proceed.   HPI: He 65 y.o mom tested + covid and currently in the hospital currently has a lot of stress due to this. Her boyfriend has covid as well. She felt ill Saturday and test + Monday with chest congestion/sob/diarrhea, sinus pressure, post nasal drip, left nose congestion left ?right  Tried dayquil/Nyquil, tylenol h/o asthma/allergies. She has brain fog   -COVID-19 vaccine status: 3/3  ROS: See pertinent positives and negatives per HPI.  Past Medical History:  Diagnosis Date  . Anemia    Low iron and hemoglobin  . Anginal pain (HCC)    states she thinks it is stress  . Anxiety   . Asthma   . Depression   . Dysrhythmia    states she has PVC's  . Fibromyalgia   . Hypertension   . Recurrent vomiting 02/04/2018  . Schatzki's ring    dialated 3 times by Markham Jordan     Past Surgical History:  Procedure Laterality Date  . ABDOMINAL ADHESION SURGERY    . BUNIONECTOMY     left foot   . CERVICAL FUSION    . CESAREAN SECTION    . CHOLECYSTECTOMY  June 2013  . GASTRIC BYPASS       Current Outpatient Medications:  .  albuterol (VENTOLIN HFA) 108 (90 Base) MCG/ACT inhaler, Inhale 2 puffs into the lungs every 6 (six) hours as needed for wheezing or shortness of breath., Disp: 18 g, Rfl: 2 .  atorvastatin (LIPITOR) 20 MG tablet, Take 1 tablet (20 mg total) by mouth daily., Disp: 90 tablet, Rfl: 3 .  azithromycin (ZITHROMAX) 250 MG tablet, Take 2 tablets on day 1, then 1 tablet daily on days 2 through 5 with food,  Disp: 6 tablet, Rfl: 0 .  calcium carbonate (OS-CAL) 600 MG TABS, Take 600 mg by mouth 2 (two) times daily with a meal., Disp: , Rfl:  .  conjugated estrogens (PREMARIN) vaginal cream, Place 1 Applicatorful vaginally every 30 (thirty) days. 2-3 x per month, Disp: , Rfl:  .  Dextromethorphan-Guaifenesin 60-1200 MG 12hr tablet, Take 1 tablet by mouth every 12 (twelve) hours. Cough, Disp: 60 tablet, Rfl: 0 .  escitalopram (LEXAPRO) 20 MG tablet, Take 20 mg by mouth daily. , Disp: , Rfl:  .  fluticasone (FLONASE) 50 MCG/ACT nasal spray, Place 2 sprays into both nostrils daily as needed for allergies or rhinitis. After nasal saline, Disp: 16 g, Rfl: 2 .  LORazepam (ATIVAN) 1 MG tablet, TAKE 1 TABLET BY MOUTH TWICE A DAY AS DIRECTED FOR ANXIETY/PANIC, Disp: , Rfl: 2 .  metoprolol succinate (TOPROL-XL) 50 MG 24 hr tablet, TAKE 1 TABLET BY MOUTH EVERY DAY WITH OR IMMEDIATELY FOLLOWING A MEAL, Disp: 90 tablet, Rfl: 1 .  Multiple Vitamin (MULTIVITAMIN) tablet, Take 1 tablet by mouth daily. Bariatric Vitamin, Disp: , Rfl:  .  pantoprazole (PROTONIX) 40 MG tablet, TAKE 1 TABLET BY MOUTH EVERY DAY, Disp: 90 tablet, Rfl: 1 .  predniSONE (DELTASONE) 20 MG tablet, Take 2 tablets (40 mg total) by mouth daily with breakfast. X 7 to 10  days as needed chest congestion with covid, Disp: 20 tablet, Rfl: 0 .  sodium chloride (OCEAN) 0.65 % SOLN nasal spray, Place 2 sprays into both nostrils daily as needed for congestion. Followed by flonase 2 sprays, Disp: 30 mL, Rfl: 2 .  traMADol (ULTRAM) 50 MG tablet, Take 1 tablet (50 mg total) by mouth 2 (two) times daily as needed., Disp: 60 tablet, Rfl: 0 .  zolpidem (AMBIEN) 10 MG tablet, Take 10 mg by mouth at bedtime as needed for sleep., Disp: , Rfl:  .  acyclovir (ZOVIRAX) 400 MG tablet, Take 1 tablet (400 mg total) by mouth 5 (five) times daily. (Patient not taking: Reported on 07/08/2020), Disp: 35 tablet, Rfl: 3 .  gabapentin (NEURONTIN) 100 MG capsule, LIMIT 1-3 CAPSULES BY  MOUTH PER DAY IF TOLERATED (Patient not taking: No sig reported), Disp: 270 capsule, Rfl: 1 .  nortriptyline (PAMELOR) 10 MG capsule, Take 20 mg by mouth at bedtime. (Patient not taking: No sig reported), Disp: , Rfl:  .  ondansetron (ZOFRAN) 4 MG tablet, Take 1 tablet (4 mg total) by mouth every 8 (eight) hours as needed for nausea or vomiting. (Patient not taking: Reported on 07/08/2020), Disp: 40 tablet, Rfl: 0  EXAM:  VITALS per patient if applicable:  GENERAL: alert, oriented, appears well and in no acute distress  PSYCH/NEURO: pleasant and cooperative, no obvious depression or anxiety, speech and thought processing grossly intact  ASSESSMENT AND PLAN:  Discussed the following assessment and plan:  COVID-19 with allergiesasthma exacerbation and diarrhea/sinusitis- Plan: DG Chest 2 View, azithromycin (ZITHROMAX) 250 MG tablet, predniSONE (DELTASONE) 40 MG tablet qd x 7-10 days Dextromethorphan-Guaifenesin 60-1200 MG 12hr tablet, albuterol inhaler DG Chest 2 View, Dextromethorphan-Guaifenesin 60-1200 MG 12hr tablet Go to ED if worse  Call back if worse  cxr ordered in case if worse  Consider covid 19 pill declines for now will call back   For diarrhea  See list of foods below  peptobismol  gatorade zero sugar  Water  Align or culturelle probiotics   There is no medication other than over the counter meds:  Mucinex dm green label for cough or Robitussin DM Vitamin C 1000 mg daily.  Vitamin D3 4000 Iu (units) daily.  Zinc 100 mg daily.  Quercetin 250-500 mg 2 times per day   Elderberry  Oil of oregano  cepacol or chloroseptic spray  Warm tea with honey and lemon  Warm salt gargles  Hydration  Try to eat though you dont feel like it   Tylenol or Advil  Nasal saline  Flonase  Over the counter allergy pill claritin, allegra, xyzal, zyrtec     Monitor pulse oximeter, buy from [place] if oxygen is less than 90 please go to the hospital.  Are you feeling really sick?  Shortness of breath, cough, chest pain?, dizziness? Confusion   If so let me know  If worsening, go to hospital or [facility] clinic Urgent care for further treatment     -we discussed possible serious and likely etiologies, options for evaluation and workup, limitations of telemedicine visit vs in person visit, treatment, treatment risks and precautions.  I discussed the assessment and treatment plan with the patient. The patient was provided an opportunity to ask questions and all were answered. The patient agreed with the plan and demonstrated an understanding of the instructions.    Time spent 20 min Bevelyn Buckles, MD

## 2020-07-08 NOTE — Progress Notes (Signed)
Tested positive for Covid Monday. Patient's mother in the hospital with Covid Sunday night.  Chest and nasal congestion, had fever/chills at the beginning but this has cleared up. Body aches, diarrhea starting last night, sinus pressure in the nasal area/eyes/ears.

## 2020-07-08 NOTE — Patient Instructions (Addendum)
For diarrhea  See list of foods below  peptobismol  gatorade zero sugar  Water  Align or culturelle probiotics   There is no medication other than over the counter meds:  Mucinex dm green label for cough or Robitussin DM Vitamin C 1000 mg daily.  Vitamin D3 4000 Iu (units) daily.  Zinc 100 mg daily.  Quercetin 250-500 mg 2 times per day   Elderberry  Oil of oregano  cepacol or chloroseptic spray  Warm tea with honey and lemon  Warm salt gargles  Hydration  Try to eat though you dont feel like it   Tylenol or Advil  Nasal saline  Flonase  Over the counter allergy pill claritin, allegra, xyzal, zyrtec     Monitor pulse oximeter, buy from [place] if oxygen is less than 90 please go to the hospital.  Are you feeling really sick? Shortness of breath, cough, chest pain?, dizziness? Confusion   If so let me know  If worsening, go to hospital or [facility] Urgent care for further treatment     Food Choices to Help Relieve Diarrhea, Adult Diarrhea can make you feel weak and cause you to become dehydrated. It is important to choose the right foods and drinks to:  Relieve diarrhea.  Replace lost fluids and nutrients.  Prevent dehydration. What are tips for following this plan? Relieving diarrhea  Avoid foods that make your diarrhea worse. These may include: ? Foods and beverages sweetened with high-fructose corn syrup, honey, or sweeteners such as xylitol, sorbitol, and mannitol. ? Fried, greasy, or spicy foods. ? Raw fruits and vegetables.  Eat foods that are rich in probiotics. These include foods such as yogurt and fermented milk products. Probiotics can help increase healthy bacteria in your stomach and intestines (gastrointestinal tract or GI tract). This may help digestion and stop diarrhea.  If you have lactose intolerance, avoid dairy products. These may make your diarrhea worse.  Take medicine to help stop diarrhea only as told by your health care  provider. Replacing nutrients  Eat bland, easy-to-digest foods in small amounts as you are able, until your diarrhea starts to get better. These foods include bananas, applesauce, rice, toast, and crackers.  Gradually reintroduce nutrient-rich foods as tolerated or as told by your health care provider. This includes: ? Well-cooked protein foods, such as eggs, lean meats like fish or chicken without skin, and tofu. ? Peeled, seeded, and soft-cooked fruits and vegetables. ? Low-fat dairy products. ? Whole grains.  Take vitamin and mineral supplements as told by your health care provider.   Preventing dehydration  Start by sipping water or a solution to prevent dehydration (oral rehydration solution, ORS). This is a drink that helps replace fluids and minerals your body has lost. You can buy an ORS at pharmacies and retail stores.  Try to drink at least 8-10 cups (2,000-2,500 mL) of fluid each day to help replace lost fluids. If you have urine that is pale yellow, you are getting enough fluids.  You may drink other liquids in addition to water, such as fruit juice that you have added water to (diluted fruit juice) or low-calorie sports drinks, as tolerated or as told by your health care provider.  Avoid drinks with caffeine, such as coffee, tea, or soft drinks.  Avoid alcohol.   Summary  When you have diarrhea, it is important to choose the right foods and drinks to relieve diarrhea, to replace lost fluids and nutrients, and to prevent dehydration.  Make sure you  drink enough fluid to keep your urine pale yellow.  You may benefit from eating bland foods at first. Gradually reintroduce healthy, nutrient-rich foods as tolerated or as told by your health care provider.  Avoid foods that make your diarrhea worse, such as fried, greasy, or spicy foods. This information is not intended to replace advice given to you by your health care provider. Make sure you discuss any questions you have  with your health care provider. Document Revised: 04/16/2019 Document Reviewed: 04/16/2019 Elsevier Patient Education  2021 Elsevier Inc.  Diarrhea, Adult Diarrhea is frequent loose and watery bowel movements. Diarrhea can make you feel weak and cause you to become dehydrated. Dehydration can make you tired and thirsty, cause you to have a dry mouth, and decrease how often you urinate. Diarrhea typically lasts 2-3 days. However, it can last longer if it is a sign of something more serious. It is important to treat your diarrhea as told by your health care provider. Follow these instructions at home: Eating and drinking Follow these recommendations as told by your health care provider:  Take an oral rehydration solution (ORS). This is an over-the-counter medicine that helps return your body to its normal balance of nutrients and water. It is found at pharmacies and retail stores.  Drink plenty of fluids, such as water, ice chips, diluted fruit juice, and low-calorie sports drinks. You can drink milk also, if desired.  Avoid drinking fluids that contain a lot of sugar or caffeine, such as energy drinks, sports drinks, and soda.  Eat bland, easy-to-digest foods in small amounts as you are able. These foods include bananas, applesauce, rice, lean meats, toast, and crackers.  Avoid alcohol.  Avoid spicy or fatty foods.      Medicines  Take over-the-counter and prescription medicines only as told by your health care provider.  If you were prescribed an antibiotic medicine, take it as told by your health care provider. Do not stop using the antibiotic even if you start to feel better. General instructions  Wash your hands often using soap and water. If soap and water are not available, use a hand sanitizer. Others in the household should wash their hands as well. Hands should be washed: ? After using the toilet or changing a diaper. ? Before preparing, cooking, or serving food. ? While  caring for a sick person or while visiting someone in a hospital.  Drink enough fluid to keep your urine pale yellow.  Rest at home while you recover.  Watch your condition for any changes.  Take a warm bath to relieve any burning or pain from frequent diarrhea episodes.  Keep all follow-up visits as told by your health care provider. This is important.   Contact a health care provider if:  You have a fever.  Your diarrhea gets worse.  You have new symptoms.  You cannot keep fluids down.  You feel light-headed or dizzy.  You have a headache.  You have muscle cramps. Get help right away if:  You have chest pain.  You feel extremely weak or you faint.  You have bloody or black stools or stools that look like tar.  You have severe pain, cramping, or bloating in your abdomen.  You have trouble breathing or you are breathing very quickly.  Your heart is beating very quickly.  Your skin feels cold and clammy.  You feel confused.  You have signs of dehydration, such as: ? Dark urine, very little urine, or no urine. ?  Cracked lips. ? Dry mouth. ? Sunken eyes. ? Sleepiness. ? Weakness. Summary  Diarrhea is frequent loose and watery bowel movements. Diarrhea can make you feel weak and cause you to become dehydrated.  Drink enough fluids to keep your urine pale yellow.  Make sure that you wash your hands after using the toilet. If soap and water are not available, use hand sanitizer.  Contact a health care provider if your diarrhea gets worse or you have new symptoms.  Get help right away if you have signs of dehydration. This information is not intended to replace advice given to you by your health care provider. Make sure you discuss any questions you have with your health care provider. Document Revised: 07/17/2018 Document Reviewed: 08/04/2017 Elsevier Patient Education  2021 Elsevier Inc.  [place] Diet A bland diet consists of foods that are often soft and  do not have a lot of fat, fiber, or extra seasonings. Foods without fat, fiber, or seasoning are easier for the body to digest. They are also less likely to irritate your mouth, throat, stomach, and other parts of your digestive system. A bland diet is sometimes called a BRAT diet. What is my plan? Your health care provider or food and nutrition specialist (dietitian) may recommend specific changes to your diet to prevent symptoms or to treat your symptoms. These changes may include:  Eating small meals often.  Cooking food until it is soft enough to chew easily.  Chewing your food well.  Drinking fluids slowly.  Not eating foods that are very spicy, sour, or fatty.  Not eating citrus fruits, such as oranges and grapefruit. What do I need to know about this diet?  Eat a variety of foods from the bland diet food list.  Do not follow a bland diet longer than needed.  Ask your health care provider whether you should take vitamins or supplements. What foods can I eat? Grains Hot cereals, such as cream of wheat. Rice. Bread, crackers, or tortillas made from refined white flour.   Vegetables Canned or cooked vegetables. Mashed or boiled potatoes. Fruits Bananas. Applesauce. Other types of cooked or canned fruit with the skin and seeds removed, such as canned peaches or pears.   Meats and other proteins Scrambled eggs. Creamy peanut butter or other nut butters. Lean, well-cooked meats, such as chicken or fish. Tofu. Soups or broths.   Dairy Low-fat dairy products, such as milk, cottage cheese, or yogurt. Beverages Water. Herbal tea. Apple juice.   Fats and oils Mild salad dressings. Canola or olive oil. Sweets and desserts Pudding. Custard. Fruit gelatin. Ice cream. The items listed above may not be a complete list of recommended foods and beverages. Contact a dietitian for more options. What foods are not recommended? Grains Whole grain breads and cereals. Vegetables Raw  vegetables. Fruits Raw fruits, especially citrus, berries, or dried fruits. Dairy Whole fat dairy foods. Beverages Caffeinated drinks. Alcohol. Seasonings and condiments Strongly flavored seasonings or condiments. Hot sauce. Salsa. Other foods Spicy foods. Fried foods. Sour foods, such as pickled or fermented foods. Foods with high sugar content. Foods high in fiber. The items listed above may not be a complete list of foods and beverages to avoid. Contact a dietitian for more information. Summary  A bland diet consists of foods that are often soft and do not have a lot of fat, fiber, or extra seasonings.  Foods without fat, fiber, or seasoning are easier for the body to digest.  Check with your health  care provider to see how long you should follow this diet plan. It is not meant to be followed for long periods. This information is not intended to replace advice given to you by your health care provider. Make sure you discuss any questions you have with your health care provider. Document Revised: 03/29/2017 Document Reviewed: 03/29/2017 Elsevier Patient Education  2021 [employer].

## 2020-07-09 ENCOUNTER — Ambulatory Visit: Payer: Medicare Other

## 2020-07-16 ENCOUNTER — Telehealth: Payer: Self-pay | Admitting: *Deleted

## 2020-07-16 DIAGNOSIS — R7303 Prediabetes: Secondary | ICD-10-CM

## 2020-07-16 DIAGNOSIS — I10 Essential (primary) hypertension: Secondary | ICD-10-CM

## 2020-07-16 DIAGNOSIS — D509 Iron deficiency anemia, unspecified: Secondary | ICD-10-CM

## 2020-07-16 NOTE — Telephone Encounter (Signed)
Fasting labs,  Due after June 19

## 2020-07-16 NOTE — Telephone Encounter (Signed)
Please place future orders for lab appt.  

## 2020-07-22 ENCOUNTER — Other Ambulatory Visit: Payer: Medicare Other

## 2020-07-30 ENCOUNTER — Other Ambulatory Visit: Payer: Self-pay | Admitting: Internal Medicine

## 2020-08-01 ENCOUNTER — Other Ambulatory Visit: Payer: Self-pay

## 2020-08-01 ENCOUNTER — Emergency Department
Admission: EM | Admit: 2020-08-01 | Discharge: 2020-08-01 | Disposition: A | Discharge status: Home / Self Care | Payer: Medicare Other | Attending: Emergency Medicine | Admitting: Emergency Medicine

## 2020-08-01 DIAGNOSIS — T7840XA Allergy, unspecified, initial encounter: Secondary | ICD-10-CM | POA: Insufficient documentation

## 2020-08-01 DIAGNOSIS — Z5321 Procedure and treatment not carried out due to patient leaving prior to being seen by health care provider: Secondary | ICD-10-CM | POA: Diagnosis not present

## 2020-08-01 NOTE — ED Triage Notes (Signed)
Pt presents with a cc of allergic reaction. Pt had generalized hives, lip/oral swelling, and erythema noted  Dx with COVID 3 weeks ago and taking new medication. Unsure of name. Took 75mg  oral benadryl PTA. Airway patent. Lip swelling still present but improved.

## 2020-08-04 ENCOUNTER — Encounter: Payer: Self-pay | Admitting: Internal Medicine

## 2020-08-04 ENCOUNTER — Ambulatory Visit (INDEPENDENT_AMBULATORY_CARE_PROVIDER_SITE_OTHER): Payer: Medicare Other | Admitting: Internal Medicine

## 2020-08-04 ENCOUNTER — Other Ambulatory Visit: Payer: Self-pay

## 2020-08-04 DIAGNOSIS — F5105 Insomnia due to other mental disorder: Secondary | ICD-10-CM

## 2020-08-04 DIAGNOSIS — F409 Phobic anxiety disorder, unspecified: Secondary | ICD-10-CM

## 2020-08-04 DIAGNOSIS — Z8616 Personal history of COVID-19: Secondary | ICD-10-CM | POA: Diagnosis not present

## 2020-08-04 DIAGNOSIS — S5420XA Injury of radial nerve at forearm level, unspecified arm, initial encounter: Secondary | ICD-10-CM | POA: Diagnosis not present

## 2020-08-04 DIAGNOSIS — M549 Dorsalgia, unspecified: Secondary | ICD-10-CM

## 2020-08-04 DIAGNOSIS — I10 Essential (primary) hypertension: Secondary | ICD-10-CM

## 2020-08-04 DIAGNOSIS — M1990 Unspecified osteoarthritis, unspecified site: Secondary | ICD-10-CM

## 2020-08-04 DIAGNOSIS — G8929 Other chronic pain: Secondary | ICD-10-CM | POA: Diagnosis not present

## 2020-08-04 DIAGNOSIS — R7303 Prediabetes: Secondary | ICD-10-CM | POA: Diagnosis not present

## 2020-08-04 MED ORDER — TRAMADOL HCL 50 MG PO TABS: 50.0000 mg | ORAL_TABLET | Freq: Two times a day (BID) | ORAL | 5 refills | Status: DC | PRN

## 2020-08-04 MED ORDER — AMLODIPINE BESYLATE 2.5 MG PO TABS: 2.5000 mg | ORAL_TABLET | Freq: Every day | ORAL | 1 refills | Status: DC

## 2020-08-04 MED ORDER — GABAPENTIN 100 MG PO CAPS: 100.0000 mg | ORAL_CAPSULE | Freq: Three times a day (TID) | ORAL | 3 refills | Status: DC

## 2020-08-04 NOTE — Progress Notes (Signed)
Subjective:  Patient ID: Sherri Lee, female    DOB: 11-06-1955  Age: 65 y.o. MRN: [id number]  CC: Diagnoses of Mid back pain, Arthritis, Other chronic pain, Essential hypertension, Insomnia due to anxiety and fear, Prediabetes, Personal history of COVID-19, and Injury of branch of radial nerve were pertinent to this visit.  HPI [name] Alaura Schippers presents for 4 week follow up on COVID INFECTION    This visit occurred during the SARS-CoV-2 public health emergency.  Safety protocols were in place, including screening questions prior to the visit, additional usage of staff PPE, and extensive cleaning of exam room while observing appropriate contact time as indicated for disinfecting solutions.   Stress: Patient and mother both became infected with COVID 19 during the last week in April.  Mother was hospitalized with COVID  And is now home with an aide for 4 hours daily  But needs more care and does not want to be placed.   Mother does not have dementia but has poor health and walks with a walker, has glaucoma and hearing loss. Patient is spending a majority of her time caring for mother and  has not talked with her mother's physician about getting incnreased care.   Patient reports persistent fatigue and residual cough from her COVID infection   Treated in ED on May 21 for "allergic reaction."  Pt had generalized hives, lip/oral swelling, and erythema noted  Dx with COVID 3 weeks ago and taking new medication. Unsure of name. Took 75mg  oral benadryl PTA. Airway patent. Lip swelling still present but improved.  She left without treatment  .  Appears to have been due to oral diclofenac  History of fall August 2020.  Has chronic right hand pain in the thumb/index  Area and pain  in the lower back .  Pain aggravated by sleeping on mother's mattress . Was previously taking gabapentin , tramadol and nortriptyline.  Told she has permanent nerve damage in the hand from an injury to  the nerve ;  she had a laceration from broken glass.   Not checking BP. Taking metoprolol   Aortic atherosclerosis :  Taking statin,  Dicussed today     Outpatient Medications Prior to Visit  Medication Sig Dispense Refill  . acyclovir (ZOVIRAX) 400 MG tablet Take 1 tablet (400 mg total) by mouth 5 (five) times daily. 35 tablet 3  . albuterol (VENTOLIN HFA) 108 (90 Base) MCG/ACT inhaler Inhale 2 puffs into the lungs every 6 (six) hours as needed for wheezing or shortness of breath. 18 g 2  . atorvastatin (LIPITOR) 20 MG tablet Take 1 tablet (20 mg total) by mouth daily. 90 tablet 3  . calcium carbonate (OS-CAL) 600 MG TABS Take 600 mg by mouth 2 (two) times daily with a meal.    . conjugated estrogens (PREMARIN) vaginal cream Place 1 Applicatorful vaginally every 30 (thirty) days. 2-3 x per month    . Dextromethorphan-Guaifenesin 60-1200 MG 12hr tablet Take 1 tablet by mouth every 12 (twelve) hours. Cough 60 tablet 0  . escitalopram (LEXAPRO) 20 MG tablet Take 20 mg by mouth daily.     . fluticasone (FLONASE) 50 MCG/ACT nasal spray Place 2 sprays into both nostrils daily as needed for allergies or rhinitis. After nasal saline 16 g 2  . LORazepam (ATIVAN) 1 MG tablet TAKE 1 TABLET BY MOUTH TWICE A DAY AS DIRECTED FOR ANXIETY/PANIC  2  . metoprolol succinate (TOPROL-XL) 50 MG 24 hr tablet TAKE 1 TABLET BY MOUTH  EVERY DAY WITH OR IMMEDIATELY FOLLOWING A MEAL 90 tablet 1  . Multiple Vitamin (MULTIVITAMIN) tablet Take 1 tablet by mouth daily. Bariatric Vitamin    . pantoprazole (PROTONIX) 40 MG tablet TAKE 1 TABLET BY MOUTH EVERY DAY 90 tablet 1  . sodium chloride (OCEAN) 0.65 % SOLN nasal spray Place 2 sprays into both nostrils daily as needed for congestion. Followed by flonase 2 sprays 30 mL 2  . zolpidem (AMBIEN) 10 MG tablet Take 10 mg by mouth at bedtime as needed for sleep.    Marland Kitchen ondansetron (ZOFRAN) 4 MG tablet Take 1 tablet (4 mg total) by mouth every 8 (eight) hours as needed for nausea  or vomiting. (Patient not taking: No sig reported) 40 tablet 0  . gabapentin (NEURONTIN) 100 MG capsule LIMIT 1-3 CAPSULES BY MOUTH PER DAY IF TOLERATED (Patient not taking: No sig reported) 270 capsule 1  . nortriptyline (PAMELOR) 10 MG capsule Take 20 mg by mouth at bedtime. (Patient not taking: No sig reported)    . predniSONE (DELTASONE) 20 MG tablet Take 2 tablets (40 mg total) by mouth daily with breakfast. X 7 to 10 days as needed chest congestion with covid (Patient not taking: Reported on 08/04/2020) 20 tablet 0  . traMADol (ULTRAM) 50 MG tablet Take 1 tablet (50 mg total) by mouth 2 (two) times daily as needed. (Patient not taking: Reported on 08/04/2020) 60 tablet 0   No facility-administered medications prior to visit.    Review of Systems;  Patient denies headache, fevers, malaise, unintentional weight loss, skin rash, eye pain, sinus congestion and sinus pain, sore throat, dysphagia,  hemoptysis , cough, dyspnea, wheezing, chest pain, palpitations, orthopnea, edema, abdominal pain, nausea, melena, diarrhea, constipation, flank pain, dysuria, hematuria, urinary  Frequency, nocturia, numbness, tingling, seizures,  Focal weakness, Loss of consciousness,  Tremor, insomnia, depression, anxiety, and suicidal ideation.      Objective:  BP (!) 140/104   Pulse 75   Temp 97.8 F (36.6 C) (Oral)   Ht 5\' 5"  (1.651 m)   Wt 171 lb 12.8 oz (77.9 kg)   SpO2 99%   BMI 28.59 kg/m   BP Readings from Last 3 Encounters:  08/04/20 (!) 140/104  07/01/20 138/88  05/28/20 (!) 150/98    Wt Readings from Last 3 Encounters:  08/04/20 171 lb 12.8 oz (77.9 kg)  07/08/20 165 lb (74.8 kg)  07/01/20 172 lb (78 kg)    General appearance: alert, cooperative and appears stated age Ears: normal TM's and external ear canals both ears Throat: lips, mucosa, and tongue normal; teeth and gums normal Neck: no adenopathy, no carotid bruit, supple, symmetrical, trachea midline and thyroid not enlarged,  symmetric, no tenderness/mass/nodules Back: symmetric, no curvature. ROM normal. No CVA tenderness. Lungs: clear to auscultation bilaterally Heart: regular rate and rhythm, S1, S2 normal, no murmur, click, rub or gallop Abdomen: soft, non-tender; bowel sounds normal; no masses,  no organomegaly Pulses: 2+ and symmetric Skin: Skin color, texture, turgor normal. No rashes or lesions Lymph nodes: Cervical, supraclavicular, and axillary nodes normal.  Lab Results  Component Value Date   HGBA1C 5.9 05/28/2020   HGBA1C 5.7 07/29/2019   HGBA1C 5.2 02/02/2018    Lab Results  Component Value Date   CREATININE 0.69 05/28/2020   CREATININE 0.68 07/29/2019   CREATININE 0.64 11/15/2018    Lab Results  Component Value Date   WBC 6.4 05/28/2020   HGB 11.7 (L) 05/28/2020   HCT 35.5 (L) 05/28/2020   PLT 232.0  05/28/2020   GLUCOSE 86 05/28/2020   CHOL 174 07/01/2020   TRIG 94.0 07/01/2020   HDL 77.80 07/01/2020   LDLDIRECT 99.0 09/04/2015   LDLCALC 77 07/01/2020   ALT 10 05/28/2020   AST 17 05/28/2020   NA 140 05/28/2020   K 3.8 05/28/2020   CL 106 05/28/2020   CREATININE 0.69 05/28/2020   BUN 9 05/28/2020   CO2 25 05/28/2020   TSH 0.59 07/29/2019   HGBA1C 5.9 05/28/2020   MICROALBUR 0.6 09/04/2015    CT Abdomen Pelvis W Contrast  Result Date: 06/30/2020 CLINICAL DATA:  Right upper quadrant pain for 2-3 weeks. Nausea. History of gastric bypass surgery and cholecystectomy. EXAM: CT ABDOMEN AND PELVIS WITH CONTRAST TECHNIQUE: Multidetector CT imaging of the abdomen and pelvis was performed using the standard protocol following bolus administration of intravenous contrast. CONTRAST:  OMNIPAQUE IOHEXOL 300 MG/ML  SOLN COMPARISON:  CT 04/26/2013 FINDINGS: Lower chest: Included lung bases are clear.  Heart size is normal. Hepatobiliary: No focal liver abnormality is seen. Status post cholecystectomy. No biliary dilatation. Pancreas: Unremarkable. No pancreatic ductal dilatation or  surrounding inflammatory changes. Spleen: Normal in size without focal abnormality. Adrenals/Urinary Tract: Unremarkable adrenal glands. Kidneys enhance symmetrically without focal lesion, stone, or hydronephrosis. Ureters are nondilated. Urinary bladder appears unremarkable for the degree of distension. Stomach/Bowel: Postsurgical changes from gastric bypass. Oral contrast is present throughout the bowel. No evidence of obstruction. No focal bowel wall thickening or inflammatory changes. Normal retrocecal appendix (series 4, image 78). Vascular/Lymphatic: Scattered aortoiliac atherosclerotic calcifications without aneurysm. Retroaortic left renal vein. No abdominopelvic lymphadenopathy. Reproductive: Uterus and bilateral adnexa are unremarkable. Other: No free fluid. No abdominopelvic fluid collection. No pneumoperitoneum. No abdominal wall hernia. Musculoskeletal: No acute or significant osseous findings. IMPRESSION: 1. No acute abdominopelvic findings. 2. Postsurgical changes from gastric bypass and cholecystectomy. 3. Aortic atherosclerosis (ICD10-I70.0). Electronically Signed   By: Duanne Guess D.O.   On: 06/30/2020 10:19    Assessment & Plan:   Problem List Items Addressed This Visit      Unprioritized   Essential hypertension    Not at goal on metoprolol alone. Adding amlodipine 2.5 mg daily      Relevant Medications   amLODipine (NORVASC) 2.5 MG tablet   Injury of branch of radial nerve    Secondary to remote laceration of thumb, Resulting in persistent right thumb  Pain . Tramadol and gabapentin refilled       Relevant Medications   gabapentin (NEURONTIN) 100 MG capsule   Insomnia due to anxiety and fear    Managed by psychiatry since her overdose in 2019      Personal history of COVID-19    Her exam normal 3 weeks post infection and she is not hypoxic  No chest x ray needed       Prediabetes    Secondary to weight regain and lack of exercise. I have addressed  BMI and  recommended a low glycemic index diet utilizing smaller more frequent meals to increase metabolism and prevent enlargement of stomach .  I have also recommended that patient start exercising with a goal of 30 minutes of walking a minimum of 5 days per week.   Lab Results  Component Value Date   HGBA1C 5.9 05/28/2020          Other Visit Diagnoses    Mid back pain       Relevant Medications   traMADol (ULTRAM) 50 MG tablet   Arthritis  Relevant Medications   traMADol (ULTRAM) 50 MG tablet   Other chronic pain       Relevant Medications   gabapentin (NEURONTIN) 100 MG capsule   traMADol (ULTRAM) 50 MG tablet     A total of 40 minutes was spent with patient more than half of which was spent in counseling patient on her hypertension,  Recent COVID recovery,  Caregiver fatigue,  recent labs and imaging studies done, and coordination of care.  I have discontinued [place]Victoria M. Weidmann's gabapentin, nortriptyline, and predniSONE. I have also changed her traMADol. Additionally, I am having her start on gabapentin and amLODipine. Lastly, I am having her maintain her calcium carbonate, multivitamin, escitalopram, zolpidem, LORazepam, acyclovir, conjugated estrogens, pantoprazole, ondansetron, atorvastatin, albuterol, sodium chloride, fluticasone, Dextromethorphan-Guaifenesin, and metoprolol succinate.  Meds ordered this encounter  Medications  . gabapentin (NEURONTIN) 100 MG capsule    Sig: Take 1 capsule (100 mg total) by mouth 3 (three) times daily.    Dispense:  270 capsule    Refill:  3  . traMADol (ULTRAM) 50 MG tablet    Sig: Take 1 tablet (50 mg total) by mouth 2 (two) times daily as needed for moderate pain.    Dispense:  60 tablet    Refill:  5    MAXIMUM 2 DAILY  . amLODipine (NORVASC) 2.5 MG tablet    Sig: Take 1 tablet (2.5 mg total) by mouth daily.    Dispense:  90 tablet    Refill:  1    Medications Discontinued During This Encounter  Medication Reason  .  predniSONE (DELTASONE) 20 MG tablet Completed Course  . traMADol (ULTRAM) 50 MG tablet Reorder  . nortriptyline (PAMELOR) 10 MG capsule   . gabapentin (NEURONTIN) 100 MG capsule     Follow-up: Return in about 3 months (around 11/04/2020).   Sherlene Shamseresa L Addylynn Balin, MD

## 2020-08-04 NOTE — Patient Instructions (Addendum)
Start taking amlodipine 2.5 mg once daily in the morning and continue metoprolol in the evening FOR BLOOD PRESSURE     RESUME tramadol and gabapentin for your pain  Tramadol every 12 hours.  Gabapentin every 8 hours   You can add up to 2000 mg of acetominophen (tylenol) every day safely  In divided doses (500 mg every 6 hours  Or 1000 mg every 12 hours.)   Follow up in 3 months

## 2020-08-06 DIAGNOSIS — Z8616 Personal history of COVID-19: Secondary | ICD-10-CM | POA: Insufficient documentation

## 2020-08-06 NOTE — Assessment & Plan Note (Signed)
Managed by psychiatry since her overdose in 2019

## 2020-08-06 NOTE — Assessment & Plan Note (Addendum)
Secondary to remote laceration of thumb, Resulting in persistent right thumb  Pain . Tramadol and gabapentin refilled

## 2020-08-06 NOTE — Assessment & Plan Note (Signed)
Not at goal on metoprolol alone. Adding amlodipine 2.5 mg daily

## 2020-08-06 NOTE — Assessment & Plan Note (Signed)
Her exam normal 3 weeks post infection and she is not hypoxic  No chest x ray needed

## 2020-08-06 NOTE — Assessment & Plan Note (Signed)
Secondary to weight regain and lack of exercise. I have addressed  BMI and recommended a low glycemic index diet utilizing smaller more frequent meals to increase metabolism and prevent enlargement of stomach .  I have also recommended that patient start exercising with a goal of 30 minutes of walking a minimum of 5 days per week.   Lab Results  Component Value Date   HGBA1C 5.9 05/28/2020

## 2020-08-11 ENCOUNTER — Ambulatory Visit: Payer: Medicare Other | Admitting: Internal Medicine

## 2020-08-21 ENCOUNTER — Telehealth: Payer: Self-pay | Admitting: Internal Medicine

## 2020-08-21 ENCOUNTER — Telehealth: Payer: Medicare Other | Admitting: Physician Assistant

## 2020-08-21 DIAGNOSIS — B9689 Other specified bacterial agents as the cause of diseases classified elsewhere: Secondary | ICD-10-CM

## 2020-08-21 DIAGNOSIS — J019 Acute sinusitis, unspecified: Secondary | ICD-10-CM

## 2020-08-21 MED ORDER — AMOXICILLIN-POT CLAVULANATE 875-125 MG PO TABS: 1.0000 | ORAL_TABLET | Freq: Two times a day (BID) | ORAL | 0 refills | Status: DC

## 2020-08-21 MED ORDER — BENZONATATE 100 MG PO CAPS: 100.0000 mg | ORAL_CAPSULE | Freq: Three times a day (TID) | ORAL | 0 refills | Status: DC | PRN

## 2020-08-21 NOTE — Patient Instructions (Signed)
thank you for joining [name], PA-C for today's virtual visit.  While this provider is not your primary care provider (PCP), if your PCP is located in our provider database this encounter information will be shared with them immediately following your visit.  Consent: (Patient) Sherri Lee provided verbal consent for this virtual visit at the beginning of the encounter.  Current Medications:  Current Outpatient Medications:    amoxicillin-clavulanate (AUGMENTIN) 875-125 MG tablet, Take 1 tablet by mouth 2 (two) times daily., Disp: 14 tablet, Rfl: 0   benzonatate (TESSALON) 100 MG capsule, Take 1 capsule (100 mg total) by mouth 3 (three) times daily as needed for cough., Disp: 30 capsule, Rfl: 0   acyclovir (ZOVIRAX) 400 MG tablet, Take 1 tablet (400 mg total) by mouth 5 (five) times daily., Disp: 35 tablet, Rfl: 3   albuterol (VENTOLIN HFA) 108 (90 Base) MCG/ACT inhaler, Inhale 2 puffs into the lungs every 6 (six) hours as needed for wheezing or shortness of breath., Disp: 18 g, Rfl: 2   amLODipine (NORVASC) 2.5 MG tablet, Take 1 tablet (2.5 mg total) by mouth daily., Disp: 90 tablet, Rfl: 1   atorvastatin (LIPITOR) 20 MG tablet, Take 1 tablet (20 mg total) by mouth daily., Disp: 90 tablet, Rfl: 3   calcium carbonate (OS-CAL) 600 MG TABS, Take 600 mg by mouth 2 (two) times daily with a meal., Disp: , Rfl:    conjugated estrogens (PREMARIN) vaginal cream, Place 1 Applicatorful vaginally every 30 (thirty) days. 2-3 x per month, Disp: , Rfl:    Dextromethorphan-Guaifenesin 60-1200 MG 12hr tablet, Take 1 tablet by mouth every 12 (twelve) hours. Cough, Disp: 60 tablet, Rfl: 0   escitalopram (LEXAPRO) 20 MG tablet, Take 20 mg by mouth daily. , Disp: , Rfl:    fluticasone (FLONASE) 50 MCG/ACT nasal spray, Place 2 sprays into both nostrils daily as needed for allergies or rhinitis. After nasal saline, Disp: 16 g, Rfl: 2   gabapentin (NEURONTIN) 100 MG capsule, Take 1 capsule  (100 mg total) by mouth 3 (three) times daily., Disp: 270 capsule, Rfl: 3   LORazepam (ATIVAN) 1 MG tablet, TAKE 1 TABLET BY MOUTH TWICE A DAY AS DIRECTED FOR ANXIETY/PANIC, Disp: , Rfl: 2   metoprolol succinate (TOPROL-XL) 50 MG 24 hr tablet, TAKE 1 TABLET BY MOUTH EVERY DAY WITH OR IMMEDIATELY FOLLOWING A MEAL, Disp: 90 tablet, Rfl: 1   Multiple Vitamin (MULTIVITAMIN) tablet, Take 1 tablet by mouth daily. Bariatric Vitamin, Disp: , Rfl:    pantoprazole (PROTONIX) 40 MG tablet, TAKE 1 TABLET BY MOUTH EVERY DAY, Disp: 90 tablet, Rfl: 1   traMADol (ULTRAM) 50 MG tablet, Take 1 tablet (50 mg total) by mouth 2 (two) times daily as needed for moderate pain., Disp: 60 tablet, Rfl: 5   zolpidem (AMBIEN) 10 MG tablet, Take 10 mg by mouth at bedtime as needed for sleep., Disp: , Rfl:    Medications ordered in this encounter:  Meds ordered this encounter  Medications   amoxicillin-clavulanate (AUGMENTIN) 875-125 MG tablet    Sig: Take 1 tablet by mouth 2 (two) times daily.    Dispense:  14 tablet    Refill:  0    Order Specific Question:   Supervising Provider    Answer:   MILLER, BRIAN [[id number]]   benzonatate (TESSALON) 100 MG capsule    Sig: Take 1 capsule (100 mg total) by mouth 3 (three) times daily as needed for cough.    Dispense:  30 capsule  Refill:  0    Order Specific Question:   Supervising Provider    Answer:   Eber Hong [[id number]]     *If you need refills on other medications prior to your next appointment, please contact your pharmacy*  Follow-Up: Call back or seek an in-person evaluation if the symptoms worsen or if the condition fails to improve as anticipated.   Other Instructions Please take antibiotic as directed.  Increase fluid intake.  Use Saline nasal spray.  Take a daily multivitamin. Use the Flonase and Tessalon as directed.  Place a humidifier in the bedroom.  Please call or return clinic if symptoms are not improving.  Sinusitis Sinusitis is redness, soreness, and  swelling (inflammation) of the paranasal sinuses. Paranasal sinuses are air pockets within the bones of your face (beneath the eyes, the middle of the forehead, or above the eyes). In healthy paranasal sinuses, mucus is able to drain out, and air is able to circulate through them by way of your nose. However, when your paranasal sinuses are inflamed, mucus and air can become trapped. This can allow bacteria and other germs to grow and cause infection. Sinusitis can develop quickly and last only a short time (acute) or continue over a long period (chronic). Sinusitis that lasts for more than 12 weeks is considered chronic.  CAUSES  Causes of sinusitis include: Allergies. Structural abnormalities, such as displacement of the cartilage that separates your nostrils (deviated septum), which can decrease the air flow through your nose and sinuses and affect sinus drainage. Functional abnormalities, such as when the small hairs (cilia) that line your sinuses and help remove mucus do not work properly or are not present. SYMPTOMS  Symptoms of acute and chronic sinusitis are the same. The primary symptoms are pain and pressure around the affected sinuses. Other symptoms include: Upper toothache. Earache. Headache. Bad breath. Decreased sense of smell and taste. A cough, which worsens when you are lying flat. Fatigue. Fever. Thick drainage from your nose, which often is green and may contain pus (purulent). Swelling and warmth over the affected sinuses. DIAGNOSIS  Your caregiver will perform a physical exam. During the exam, your caregiver may: Look in your nose for signs of abnormal growths in your nostrils (nasal polyps). Tap over the affected sinus to check for signs of infection. View the inside of your sinuses (endoscopy) with a special imaging device with a light attached (endoscope), which is inserted into your sinuses. If your caregiver suspects that you have chronic sinusitis, one or more of  the following tests may be recommended: Allergy tests. Nasal culture A sample of mucus is taken from your nose and sent to a lab and screened for bacteria. Nasal cytology A sample of mucus is taken from your nose and examined by your caregiver to determine if your sinusitis is related to an allergy. TREATMENT  Most cases of acute sinusitis are related to a viral infection and will resolve on their own within 10 days. Sometimes medicines are prescribed to help relieve symptoms (pain medicine, decongestants, nasal steroid sprays, or saline sprays).  However, for sinusitis related to a bacterial infection, your caregiver will prescribe antibiotic medicines. These are medicines that will help kill the bacteria causing the infection.  Rarely, sinusitis is caused by a fungal infection. In theses cases, your caregiver will prescribe antifungal medicine. For some cases of chronic sinusitis, surgery is needed. Generally, these are cases in which sinusitis recurs more than 3 times per year, despite other treatments. HOME CARE  INSTRUCTIONS  Drink plenty of water. Water helps thin the mucus so your sinuses can drain more easily. Use a humidifier. Inhale steam 3 to 4 times a day (for example, sit in the bathroom with the shower running). Apply a warm, moist washcloth to your face 3 to 4 times a day, or as directed by your caregiver. Use saline nasal sprays to help moisten and clean your sinuses. Take over-the-counter or prescription medicines for pain, discomfort, or fever only as directed by your caregiver. SEEK IMMEDIATE MEDICAL CARE IF: You have increasing pain or severe headaches. You have nausea, vomiting, or drowsiness. You have swelling around your face. You have vision problems. You have a stiff neck. You have difficulty breathing. MAKE SURE YOU:  Understand these instructions. Will watch your condition. Will get help right away if you are not doing well or get worse. Document Released:  02/28/2005 Document Revised: 05/23/2011 Document Reviewed: 03/15/2011 [facility] Patient Information 2014 [place], [place].

## 2020-08-21 NOTE — Telephone Encounter (Signed)
Transfer to [place] at [facility]  PT called into the office to inform that she had covid back in the last part of April. PT advise it went away she got better but now it feels like it came back. PT states that she has a headache, sinus congestion as well as ears/chest congestion, she has fatigue and a cough. Transfer to Access Nurse.

## 2020-08-21 NOTE — Progress Notes (Signed)
Virtual Visit Consent   Sherri Lee, you are scheduled for a virtual visit with a [facility] provider today.     Just as with appointments in the office, your consent must be obtained to participate.  Your consent will be active for this visit and any virtual visit you may have with one of our providers in the next 365 days.     If you have a MyChart account, a copy of this consent can be sent to you electronically.  All virtual visits are billed to your insurance company just like a traditional visit in the office.    As this is a virtual visit, video technology does not allow for your provider to perform a traditional examination.  This may limit your provider's ability to fully assess your condition.  If your provider identifies any concerns that need to be evaluated in person or the need to arrange testing (such as labs, EKG, etc.), we will make arrangements to do so.     Although advances in technology are sophisticated, we cannot ensure that it will always work on either your end or our end.  If the connection with a video visit is poor, the visit may have to be switched to a telephone visit.  With either a video or telephone visit, we are not always able to ensure that we have a secure connection.     I need to obtain your verbal consent now.   Are you willing to proceed with your visit today?    Benetta Spar [name] has provided verbal consent on 08/21/2020 for a virtual visit (video or telephone).   Sherri Lee, [place]   Date: 08/21/2020 3:44 PM  Virtual Visit via Video Note   I, Sherri Climes, PA-C, attempted to connect with Sherri Lee; MRN [id number] on 08/21/20 via [employer] to complete a video urgent care visit. The patient was unable to successfully connect to the video platform. As such, the patient was contacted by this provider via phone to complete the encounter.     Location: Patient: Virtual Visit Location Patient:  Home Provider: Virtual Visit Location Provider: Home Office   I discussed the limitations of evaluation and management by telemedicine and the availability of in person appointments. The patient expressed understanding and agreed to proceed.    History of Present Illness: Sherri Lee is a 65 y.o. who identifies as a female who was assigned female at birth, and is being seen today for concerns of a sinus infection.  Patient endorses over the past week having nasal and head congestion with dry cough, sinus pressure and ear pressure.  Symptoms have been worsening especially over the past few days.  Now with significant sinus pain.  Denies any fever, chills or aches.  Does note chest congestion even though her cough is dry.  Denies any chest pain or shortness of breath.  Denies any recent travel or sick contact.  Has been taking DayQuil, Flonase, Benadryl and Mucinex for her symptoms.  Of note patient had COVID in May.  HPI: HPI  Problems:  Patient Active Problem List   Diagnosis Date Noted   Personal history of COVID-19 08/06/2020   RUQ pain 07/03/2020   Encounter for preventive health examination 07/30/2019   Low back pain associated with a spinal disorder other than radiculopathy or spinal stenosis 10/28/2018   Urge incontinence of urine 08/15/2018   Injury of branch of radial nerve 08/15/2018   Skin lesion of right lower extremity  11/04/2017   Adjustment disorder with mixed disturbance of emotions and conduct 03/31/2017   History of benzodiazepine use 03/31/2017   Normocytic anemia 07/25/2016   Esophageal stricture 06/19/2016   IBS (irritable bowel syndrome) 09/06/2015   IDA (iron deficiency anemia) 09/06/2015   DDD (degenerative disc disease), cervical 09/24/2014   DDD (degenerative disc disease), thoracic 09/24/2014   Tremor of both hands 09/10/2014   Insomnia due to anxiety and fear 02/26/2013   Alopecia areata 07/11/2012   Cervicalgia 07/11/2012   Degenerative joint  disease of thoracic spine 10/04/2011   Essential hypertension 05/10/2011   Prediabetes 05/10/2011   Status post gastric bypass for obesity 05/10/2011    Allergies:  Allergies  Allergen Reactions   Bacitracin-Polymyxin B Rash   Diclofenac Hives   Eggs Or Egg-Derived Products    Betadine [Povidone Iodine] Rash   Medications:  Current Outpatient Medications:    acyclovir (ZOVIRAX) 400 MG tablet, Take 1 tablet (400 mg total) by mouth 5 (five) times daily., Disp: 35 tablet, Rfl: 3   albuterol (VENTOLIN HFA) 108 (90 Base) MCG/ACT inhaler, Inhale 2 puffs into the lungs every 6 (six) hours as needed for wheezing or shortness of breath., Disp: 18 g, Rfl: 2   amLODipine (NORVASC) 2.5 MG tablet, Take 1 tablet (2.5 mg total) by mouth daily., Disp: 90 tablet, Rfl: 1   atorvastatin (LIPITOR) 20 MG tablet, Take 1 tablet (20 mg total) by mouth daily., Disp: 90 tablet, Rfl: 3   calcium carbonate (OS-CAL) 600 MG TABS, Take 600 mg by mouth 2 (two) times daily with a meal., Disp: , Rfl:    conjugated estrogens (PREMARIN) vaginal cream, Place 1 Applicatorful vaginally every 30 (thirty) days. 2-3 x per month, Disp: , Rfl:    Dextromethorphan-Guaifenesin 60-1200 MG 12hr tablet, Take 1 tablet by mouth every 12 (twelve) hours. Cough, Disp: 60 tablet, Rfl: 0   escitalopram (LEXAPRO) 20 MG tablet, Take 20 mg by mouth daily. , Disp: , Rfl:    fluticasone (FLONASE) 50 MCG/ACT nasal spray, Place 2 sprays into both nostrils daily as needed for allergies or rhinitis. After nasal saline, Disp: 16 g, Rfl: 2   gabapentin (NEURONTIN) 100 MG capsule, Take 1 capsule (100 mg total) by mouth 3 (three) times daily., Disp: 270 capsule, Rfl: 3   LORazepam (ATIVAN) 1 MG tablet, TAKE 1 TABLET BY MOUTH TWICE A DAY AS DIRECTED FOR ANXIETY/PANIC, Disp: , Rfl: 2   metoprolol succinate (TOPROL-XL) 50 MG 24 hr tablet, TAKE 1 TABLET BY MOUTH EVERY DAY WITH OR IMMEDIATELY FOLLOWING A MEAL, Disp: 90 tablet, Rfl: 1   Multiple Vitamin  (MULTIVITAMIN) tablet, Take 1 tablet by mouth daily. Bariatric Vitamin, Disp: , Rfl:    ondansetron (ZOFRAN) 4 MG tablet, Take 1 tablet (4 mg total) by mouth every 8 (eight) hours as needed for nausea or vomiting. (Patient not taking: No sig reported), Disp: 40 tablet, Rfl: 0   pantoprazole (PROTONIX) 40 MG tablet, TAKE 1 TABLET BY MOUTH EVERY DAY, Disp: 90 tablet, Rfl: 1   sodium chloride (OCEAN) 0.65 % SOLN nasal spray, Place 2 sprays into both nostrils daily as needed for congestion. Followed by flonase 2 sprays, Disp: 30 mL, Rfl: 2   traMADol (ULTRAM) 50 MG tablet, Take 1 tablet (50 mg total) by mouth 2 (two) times daily as needed for moderate pain., Disp: 60 tablet, Rfl: 5   zolpidem (AMBIEN) 10 MG tablet, Take 10 mg by mouth at bedtime as needed for sleep., Disp: , Rfl:   Observations/Objective:  Patient is in no acute distress.  No labored breathing.  Speech is clear and coherent with logical content.  Patient is alert and oriented at baseline.   Assessment and Plan: 1. Acute bacterial sinusitis Had COVID infection last month.  Note she is highly susceptible to sinusitis and bronchitis.  Concern for festering bacterial sinusitis.  Rx Augmentin.  Increase fluids.  Rest.  Saline nasal spray.  Probiotic.  Mucinex as directed.  Humidifier in bedroom.  Flonase and Tessalon per orders.   - amoxicillin-clavulanate (AUGMENTIN) 875-125 MG tablet; Take 1 tablet by mouth 2 (two) times daily.  Dispense: 14 tablet; Refill: 0 - benzonatate (TESSALON) 100 MG capsule; Take 1 capsule (100 mg total) by mouth 3 (three) times daily as needed for cough.  Dispense: 30 capsule; Refill: 0   Follow Up Instructions: I discussed the assessment and treatment plan with the patient. The patient was provided an opportunity to ask questions and all were answered. The patient agreed with the plan and demonstrated an understanding of the instructions.  A copy of instructions were sent to the patient via MyChart.  The  patient was advised to call back or seek an in-person evaluation if the symptoms worsen or if the condition fails to improve as anticipated.  Time:  I spent 12 minutes with the patient via telehealth technology discussing the above problems/concerns.    Sherri Climes, PA-C

## 2020-08-25 NOTE — Telephone Encounter (Signed)
Pt was seen at urgent care. 

## 2020-08-27 ENCOUNTER — Ambulatory Visit: Payer: Medicare Other | Admitting: Gastroenterology

## 2020-09-01 ENCOUNTER — Other Ambulatory Visit (INDEPENDENT_AMBULATORY_CARE_PROVIDER_SITE_OTHER): Payer: Medicare Other

## 2020-09-01 ENCOUNTER — Other Ambulatory Visit: Payer: Self-pay

## 2020-09-01 ENCOUNTER — Other Ambulatory Visit: Payer: Self-pay | Admitting: Internal Medicine

## 2020-09-01 DIAGNOSIS — R7303 Prediabetes: Secondary | ICD-10-CM

## 2020-09-01 DIAGNOSIS — D509 Iron deficiency anemia, unspecified: Secondary | ICD-10-CM

## 2020-09-01 DIAGNOSIS — J329 Chronic sinusitis, unspecified: Secondary | ICD-10-CM

## 2020-09-01 LAB — COMPREHENSIVE METABOLIC PANEL
ALT: 18 U/L (ref 0–35)
AST: 16 U/L (ref 0–37)
Albumin: 3.6 g/dL (ref 3.5–5.2)
Alkaline Phosphatase: 63 U/L (ref 39–117)
BUN: 9 mg/dL (ref 6–23)
CO2: 29 mEq/L (ref 19–32)
Calcium: 8.9 mg/dL (ref 8.4–10.5)
Chloride: 104 mEq/L (ref 96–112)
Creatinine, Ser: 0.61 mg/dL (ref 0.40–1.20)
GFR: 93.64 mL/min (ref >60.00)
Glucose, Bld: 90 mg/dL (ref 70–99)
Potassium: 3 mEq/L — ABNORMAL LOW (ref 3.5–5.1)
Sodium: 141 mEq/L (ref 135–145)
Total Bilirubin: 0.6 mg/dL (ref 0.2–1.2)
Total Protein: 6.1 g/dL (ref 6.0–8.3)

## 2020-09-01 LAB — LIPID PANEL
Cholesterol: 174 mg/dL (ref 0–200)
HDL: 84.9 mg/dL (ref >39.00)
LDL Cholesterol: 79 mg/dL (ref 0–99)
NonHDL: 89.55
Total CHOL/HDL Ratio: 2
Triglycerides: 55 mg/dL (ref 0.0–149.0)
VLDL: 11 mg/dL (ref 0.0–40.0)

## 2020-09-01 LAB — CBC WITH DIFFERENTIAL/PLATELET
Basophils Absolute: 0 10*3/uL (ref 0.0–0.1)
Basophils Relative: 0.6 % (ref 0.0–3.0)
Eosinophils Absolute: 0.1 10*3/uL (ref 0.0–0.7)
Eosinophils Relative: 1.6 % (ref 0.0–5.0)
HCT: 33.6 % — ABNORMAL LOW (ref 36.0–46.0)
Hemoglobin: 11.1 g/dL — ABNORMAL LOW (ref 12.0–15.0)
Lymphocytes Relative: 31.3 % (ref 12.0–46.0)
Lymphs Abs: 2.4 10*3/uL (ref 0.7–4.0)
MCHC: 33 g/dL (ref 30.0–36.0)
MCV: 87.4 fl (ref 78.0–100.0)
Monocytes Absolute: 0.4 10*3/uL (ref 0.1–1.0)
Monocytes Relative: 5.4 % (ref 3.0–12.0)
Neutro Abs: 4.6 10*3/uL (ref 1.4–7.7)
Neutrophils Relative %: 61.1 % (ref 43.0–77.0)
Platelets: 219 10*3/uL (ref 150.0–400.0)
RBC: 3.84 Mil/uL — ABNORMAL LOW (ref 3.87–5.11)
RDW: 13.9 % (ref 11.5–15.5)
WBC: 7.5 10*3/uL (ref 4.0–10.5)

## 2020-09-01 LAB — HEMOGLOBIN A1C: Hgb A1c MFr Bld: 6.4 % (ref 4.6–6.5)

## 2020-09-01 LAB — IBC + FERRITIN
Ferritin: 10.8 ng/mL (ref 10.0–291.0)
Iron: 49 ug/dL (ref 42–145)
Saturation Ratios: 11.6 % — ABNORMAL LOW (ref 20.0–50.0)
Transferrin: 303 mg/dL (ref 212.0–360.0)

## 2020-09-03 ENCOUNTER — Other Ambulatory Visit: Payer: Self-pay | Admitting: Internal Medicine

## 2020-09-03 DIAGNOSIS — E876 Hypokalemia: Secondary | ICD-10-CM | POA: Insufficient documentation

## 2020-09-03 DIAGNOSIS — T502X5A Adverse effect of carbonic-anhydrase inhibitors, benzothiadiazides and other diuretics, initial encounter: Secondary | ICD-10-CM | POA: Insufficient documentation

## 2020-09-03 DIAGNOSIS — D509 Iron deficiency anemia, unspecified: Secondary | ICD-10-CM

## 2020-09-03 MED ORDER — POTASSIUM CHLORIDE CRYS ER 20 MEQ PO TBCR: 20.0000 meq | EXTENDED_RELEASE_TABLET | Freq: Every day | ORAL | 0 refills | Status: DC

## 2020-09-03 NOTE — Assessment & Plan Note (Signed)
Worsening.  Patient did not keep GI appt on Jun 16.

## 2020-09-03 NOTE — Assessment & Plan Note (Signed)
Replacing potassium.

## 2020-09-29 ENCOUNTER — Other Ambulatory Visit: Payer: Self-pay

## 2020-09-29 ENCOUNTER — Other Ambulatory Visit: Payer: Self-pay | Admitting: Internal Medicine

## 2020-09-29 ENCOUNTER — Encounter: Payer: Self-pay | Admitting: Internal Medicine

## 2020-09-29 ENCOUNTER — Ambulatory Visit (INDEPENDENT_AMBULATORY_CARE_PROVIDER_SITE_OTHER): Payer: Medicare Other | Admitting: Internal Medicine

## 2020-09-29 VITALS — BP 122/78 | HR 74 | Temp 98.0°F | Ht 65.0 in | Wt 170.2 lb

## 2020-09-29 DIAGNOSIS — E876 Hypokalemia: Secondary | ICD-10-CM | POA: Diagnosis not present

## 2020-09-29 DIAGNOSIS — R7303 Prediabetes: Secondary | ICD-10-CM | POA: Diagnosis not present

## 2020-09-29 DIAGNOSIS — B373 Candidiasis of vulva and vagina: Secondary | ICD-10-CM | POA: Diagnosis not present

## 2020-09-29 DIAGNOSIS — J4521 Mild intermittent asthma with (acute) exacerbation: Secondary | ICD-10-CM

## 2020-09-29 DIAGNOSIS — N3000 Acute cystitis without hematuria: Secondary | ICD-10-CM

## 2020-09-29 DIAGNOSIS — B3731 Acute candidiasis of vulva and vagina: Secondary | ICD-10-CM

## 2020-09-29 MED ORDER — FLUCONAZOLE 150 MG PO TABS: 150.0000 mg | ORAL_TABLET | Freq: Once | ORAL | 0 refills | Status: AC

## 2020-09-29 MED ORDER — CIPROFLOXACIN HCL 500 MG PO TABS: 500.0000 mg | ORAL_TABLET | Freq: Two times a day (BID) | ORAL | 0 refills | Status: AC

## 2020-09-29 NOTE — Addendum Note (Signed)
Addended by: Warden Fillers on: 09/29/2020 12:09 PM   Modules accepted: Orders

## 2020-09-29 NOTE — Progress Notes (Signed)
Chief Complaint  Patient presents with   Urinary Tract Infection   F/u  1. C/w uti sx's 7-10 days freq accidents urgency and lower pelvic pressure nothing tried no dysuria  2. Fall 8 days ago and her friends mechanical on left arm/elbow bruise and knot to left buttock and bruising  3. Hypok repeat labs today   Review of Systems  Constitutional:  Negative for weight loss.  HENT:  Negative for hearing loss.   Eyes:  Negative for blurred vision.  Respiratory:  Negative for shortness of breath.   Cardiovascular:  Negative for chest pain.  Genitourinary:  Positive for frequency and urgency. Negative for dysuria and flank pain.  Musculoskeletal:  Positive for falls.  Skin:  Negative for rash.  Endo/Heme/Allergies:  Bruises/bleeds easily.  Past Medical History:  Diagnosis Date   Anemia    Low iron and hemoglobin   Anginal pain (HCC)    states she thinks it is stress   Anxiety    Asthma    COVID-19    07/05/20   Depression    Dysrhythmia    states she has PVC's   Fibromyalgia    Hypertension    Recurrent vomiting 02/04/2018   Schatzki's ring    dialated 3 times by Markham Jordan    Past Surgical History:  Procedure Laterality Date   ABDOMINAL ADHESION SURGERY     BUNIONECTOMY     left foot    CERVICAL FUSION     CESAREAN SECTION     CHOLECYSTECTOMY  June 2013   GASTRIC BYPASS     Family History  Problem Relation Age of Onset   Hypertension Mother    Glaucoma Mother    Heart disease Father    Stroke Father    Breast cancer Maternal Aunt    Cancer Maternal Aunt    Colon cancer Neg Hx    Ovarian cancer Neg Hx    Social History   Socioeconomic History   Marital status: Divorced    Spouse name: Not on file   Number of children: Not on file   Years of education: Not on file   Highest education level: Not on file  Occupational History   Not on file  Tobacco Use   Smoking status: Never   Smokeless tobacco: Never  Substance and Sexual Activity   Alcohol use: Yes     Alcohol/week: 3.0 standard drinks    Types: 3 Glasses of wine per week    Comment: once weekly   Drug use: No   Sexual activity: Yes  Other Topics Concern   Not on file  Social History Narrative   Lives with spouse, son, and stepson who has serious psychiatric issues and alcohol abuse   Social Determinants of Health   Financial Resource Strain: Low Risk    Difficulty of Paying Living Expenses: Not hard at all  Food Insecurity: No Food Insecurity   Worried About Programme researcher, broadcasting/film/video in the Last Year: Never true   Ran Out of Food in the Last Year: Never true  Transportation Needs: No Transportation Needs   Lack of Transportation (Medical): No   Lack of Transportation (Non-Medical): No  Physical Activity: Not on file  Stress: No Stress Concern Present   Feeling of Stress : Not at all  Social Connections: Unknown   Frequency of Communication with Friends and Family: More than three times a week   Frequency of Social Gatherings with Friends and Family: More than three times a week  Attends Religious Services: Not on file   Active Member of Clubs or Organizations: Not on file   Attends Banker Meetings: Not on file   Marital Status: Not on file  Intimate Partner Violence: Not At Risk   Fear of Current or Ex-Partner: No   Emotionally Abused: No   Physically Abused: No   Sexually Abused: No   Current Meds  Medication Sig   acyclovir (ZOVIRAX) 400 MG tablet Take 1 tablet (400 mg total) by mouth 5 (five) times daily.   albuterol (VENTOLIN HFA) 108 (90 Base) MCG/ACT inhaler TAKE 2 PUFFS BY MOUTH EVERY 6 HOURS AS NEEDED FOR WHEEZE OR SHORTNESS OF BREATH   amLODipine (NORVASC) 2.5 MG tablet Take 1 tablet (2.5 mg total) by mouth daily.   atorvastatin (LIPITOR) 20 MG tablet Take 1 tablet (20 mg total) by mouth daily.   ciprofloxacin (CIPRO) 500 MG tablet Take 1 tablet (500 mg total) by mouth 2 (two) times daily for 3 days.   escitalopram (LEXAPRO) 20 MG tablet Take 20 mg by  mouth daily.    fluconazole (DIFLUCAN) 150 MG tablet Take 1 tablet (150 mg total) by mouth once for 1 dose. Repeat in 3 days if needed   fluticasone (FLONASE) 50 MCG/ACT nasal spray PLACE 2 SPRAYS INTO BOTH NOSTRILS DAILY AS NEEDED FOR ALLERGIES OR RHINITIS. AFTER NASAL SALINE   gabapentin (NEURONTIN) 100 MG capsule Take 1 capsule (100 mg total) by mouth 3 (three) times daily.   LORazepam (ATIVAN) 1 MG tablet TAKE 1 TABLET BY MOUTH TWICE A DAY AS DIRECTED FOR ANXIETY/PANIC   metoprolol succinate (TOPROL-XL) 50 MG 24 hr tablet TAKE 1 TABLET BY MOUTH EVERY DAY WITH OR IMMEDIATELY FOLLOWING A MEAL   Multiple Vitamin (MULTIVITAMIN) tablet Take 1 tablet by mouth daily. Bariatric Vitamin   pantoprazole (PROTONIX) 40 MG tablet TAKE 1 TABLET BY MOUTH EVERY DAY   VITAMIN D PO Take by mouth.   zolpidem (AMBIEN) 10 MG tablet Take 10 mg by mouth at bedtime as needed for sleep.   Allergies  Allergen Reactions   Bacitracin-Polymyxin B Rash   Diclofenac Hives   Eggs Or Egg-Derived Products    Betadine [Povidone Iodine] Rash   Recent Results (from the past 2160 hour(s))  Lipid panel     Status: None   Collection Time: 07/01/20  2:35 PM  Result Value Ref Range   Cholesterol 174 0 - 200 mg/dL    Comment: ATP III Classification       Desirable:  < 200 mg/dL               Borderline High:  200 - 239 mg/dL          High:  > = 604 mg/dL   Triglycerides 54.0 0.0 - 149.0 mg/dL    Comment: Normal:  <981 mg/dLBorderline High:  150 - 199 mg/dL   HDL 19.14 >78.29 mg/dL   VLDL 56.2 0.0 - 13.0 mg/dL   LDL Cholesterol 77 0 - 99 mg/dL   Total CHOL/HDL Ratio 2     Comment:                Men          Women1/2 Average Risk     3.4          3.3Average Risk          5.0          4.42X Average Risk  9.6          7.13X Average Risk          15.0          11.0                       NonHDL 95.70     Comment: NOTE:  Non-HDL goal should be 30 mg/dL higher than patient's LDL goal (i.e. LDL goal of < 70 mg/dL, would  have non-HDL goal of < 100 mg/dL)  CBC with Differential/Platelet     Status: Abnormal   Collection Time: 09/01/20  8:20 AM  Result Value Ref Range   WBC 7.5 4.0 - 10.5 K/uL   RBC 3.84 (L) 3.87 - 5.11 Mil/uL   Hemoglobin 11.1 (L) 12.0 - 15.0 g/dL   HCT 83.3 (L) 82.5 - 05.3 %   MCV 87.4 78.0 - 100.0 fl   MCHC 33.0 30.0 - 36.0 g/dL   RDW 97.6 73.4 - 19.3 %   Platelets 219.0 150.0 - 400.0 K/uL   Neutrophils Relative % 61.1 43.0 - 77.0 %   Lymphocytes Relative 31.3 12.0 - 46.0 %   Monocytes Relative 5.4 3.0 - 12.0 %   Eosinophils Relative 1.6 0.0 - 5.0 %   Basophils Relative 0.6 0.0 - 3.0 %   Neutro Abs 4.6 1.4 - 7.7 K/uL   Lymphs Abs 2.4 0.7 - 4.0 K/uL   Monocytes Absolute 0.4 0.1 - 1.0 K/uL   Eosinophils Absolute 0.1 0.0 - 0.7 K/uL   Basophils Absolute 0.0 0.0 - 0.1 K/uL  Lipid panel     Status: None   Collection Time: 09/01/20  8:20 AM  Result Value Ref Range   Cholesterol 174 0 - 200 mg/dL    Comment: ATP III Classification       Desirable:  < 200 mg/dL               Borderline High:  200 - 239 mg/dL          High:  > = 790 mg/dL   Triglycerides 24.0 0.0 - 149.0 mg/dL    Comment: Normal:  <973 mg/dLBorderline High:  150 - 199 mg/dL   HDL 53.29 >92.42 mg/dL   VLDL 68.3 0.0 - 41.9 mg/dL   LDL Cholesterol 79 0 - 99 mg/dL   Total CHOL/HDL Ratio 2     Comment:                Men          Women1/2 Average Risk     3.4          3.3Average Risk          5.0          4.42X Average Risk          9.6          7.13X Average Risk          15.0          11.0                       NonHDL 89.55     Comment: NOTE:  Non-HDL goal should be 30 mg/dL higher than patient's LDL goal (i.e. LDL goal of < 70 mg/dL, would have non-HDL goal of < 100 mg/dL)  Comprehensive metabolic panel     Status: Abnormal   Collection Time: 09/01/20  8:20 AM  Result Value Ref Range  Sodium 141 135 - 145 mEq/L   Potassium 3.0 (L) 3.5 - 5.1 mEq/L   Chloride 104 96 - 112 mEq/L   CO2 29 19 - 32 mEq/L   Glucose, Bld 90  70 - 99 mg/dL   BUN 9 6 - 23 mg/dL   Creatinine, Ser 4.09 0.40 - 1.20 mg/dL   Total Bilirubin 0.6 0.2 - 1.2 mg/dL   Alkaline Phosphatase 63 39 - 117 U/L   AST 16 0 - 37 U/L   ALT 18 0 - 35 U/L   Total Protein 6.1 6.0 - 8.3 g/dL   Albumin 3.6 3.5 - 5.2 g/dL   GFR 81.19 >14.78 mL/min    Comment: Calculated using the CKD-EPI Creatinine Equation (2021)   Calcium 8.9 8.4 - 10.5 mg/dL  Hemoglobin G9F     Status: None   Collection Time: 09/01/20  8:20 AM  Result Value Ref Range   Hgb A1c MFr Bld 6.4 4.6 - 6.5 %    Comment: Glycemic Control Guidelines for People with Diabetes:Non Diabetic:  <6%Goal of Therapy: <7%Additional Action Suggested:  >8%   IBC + Ferritin     Status: Abnormal   Collection Time: 09/01/20  8:20 AM  Result Value Ref Range   Iron 49 42 - 145 ug/dL   Transferrin 621.3 086.5 - 360.0 mg/dL   Saturation Ratios 78.4 (L) 20.0 - 50.0 %   Ferritin 10.8 10.0 - 291.0 ng/mL   Objective  Body mass index is 28.32 kg/m. Wt Readings from Last 3 Encounters:  09/29/20 170 lb 3.2 oz (77.2 kg)  08/04/20 171 lb 12.8 oz (77.9 kg)  07/08/20 165 lb (74.8 kg)   Temp Readings from Last 3 Encounters:  09/29/20 98 F (36.7 C) (Oral)  08/04/20 97.8 F (36.6 C) (Oral)  07/01/20 98 F (36.7 C) (Oral)   BP Readings from Last 3 Encounters:  09/29/20 122/78  08/04/20 (!) 140/104  07/01/20 138/88   Pulse Readings from Last 3 Encounters:  09/29/20 74  08/04/20 75  07/01/20 86    Physical Exam Vitals and nursing note reviewed.  Constitutional:      Appearance: Normal appearance. She is well-developed and well-groomed. She is obese.  HENT:     Head: Normocephalic and atraumatic.  Eyes:     Conjunctiva/sclera: Conjunctivae normal.     Pupils: Pupils are equal, round, and reactive to light.  Cardiovascular:     Rate and Rhythm: Normal rate and regular rhythm.     Heart sounds: Normal heart sounds. No murmur heard. Abdominal:     Tenderness: There is no abdominal tenderness.  There is no right CVA tenderness or left CVA tenderness.  Skin:    General: Skin is warm and dry.  Neurological:     General: No focal deficit present.     Mental Status: She is alert and oriented to person, place, and time. Mental status is at baseline.     Gait: Gait normal.  Psychiatric:        Attention and Perception: Attention and perception normal.        Mood and Affect: Mood and affect normal.        Speech: Speech normal.        Behavior: Behavior normal. Behavior is cooperative.        Thought Content: Thought content normal.        Cognition and Memory: Cognition and memory normal.        Judgment: Judgment normal.    Assessment  Plan  Acute cystitis without hematuria - Plan: Urinalysis, Routine w reflex microscopic, Urine Culture, ciprofloxacin (CIPRO) 500 MG tablet bid x 5 days   Yeast vaginitis - Plan: fluconazole (DIFLUCAN) 150 MG tablet  Hypokalemia - Plan: Basic Metabolic Panel (BMET), Magnesium  Prediabetes  Rec healthy diet and exercise   Provider: Dr. French Anaracy [facility]

## 2020-09-29 NOTE — Patient Instructions (Addendum)
Cranberry supplements  For prevention supplement D mannose   Hematoma A hematoma is a collection of blood under the skin, in an organ, in a body space, in a joint space, or in other tissue. The blood can thicken (clot) to form a lump that you can see and feel. The lump is often firm and may become sore and tender. Most hematomas get better in a few days to weeks. However, some hematomas may be serious and require medical care. Hematomas canrange from very small to very large. What are the causes? This condition is caused by: A blunt or penetrating injury. A leakage from a blood vessel under the skin. Some medical procedures, including surgeries, such as oral surgery, face lifts, and surgeries on the joints. Some medical conditions that cause bleeding or bruising. There may be multiple hematomas that appear in different areas of the body. What increases the risk? You are more likely to develop this condition if: You are an older adult. You use blood thinners. What are the signs or symptoms?  Symptoms of this condition depend on where the hematoma is located.  Common symptoms of a hematoma that is under the skin include: A firm lump on the body. Pain and tenderness in the area. Bruising. Blue, dark blue, purple-red, or yellowish skin (discoloration) may appear at the site of the hematoma if the hematoma is close to the surface of the skin. Common symptoms of a hematoma that is deep in the tissues or body spaces may be less obvious. They include: A collection of blood in the stomach (intra-abdominal hematoma). This may cause pain in the abdomen, weakness, fainting, and shortness of breath. A collection of blood in the head (intracranial hematoma). This may cause a headache or symptoms such as weakness, trouble speaking or understanding, or a change in consciousness.  How is this diagnosed? This condition is diagnosed based on: Your medical history. A physical exam. Imaging tests, such as an  ultrasound or CT scan. These may be needed if your health care provider suspects a hematoma in deeper tissues or body spaces. Blood tests. These may be needed if your health care provider believes that the hematoma is caused by a medical condition. How is this treated? Treatment for this condition depends on the cause, size, and location of the hematoma. Treatment may include: Doing nothing. The majority of hematomas do not need treatment as many of them go away on their own over time. Surgery or close monitoring. This may be needed for large hematomas or hematomas that affect vital organs. Medicines. Medicines may be given if there is an underlying medical cause for the hematoma. Follow these instructions at home: Managing pain, stiffness, and swelling  If directed, put ice on the affected area. Put ice in a plastic bag. Place a towel between your skin and the bag. Leave the ice on for 20 minutes, 2-3 times a day for the first couple of days. If directed, apply heat to the affected area after applying ice for a couple of days. Use the heat source that your health care provider recommends, such as a moist heat pack or a heating pad. Place a towel between your skin and the heat source. Leave the heat on for 20-30 minutes. Remove the heat if your skin turns bright red. This is especially important if you are unable to feel pain, heat, or cold. You may have a greater risk of getting burned. Raise (elevate) the affected area above the level of your heart while  you are sitting or lying down. If told, wrap the affected area with an elastic bandage. The bandage applies pressure (compression) to the area, which may help to reduce swelling and promote healing. Do not wrap the bandage too tightly around the affected area. If your hematoma is on a leg or foot (lower extremity) and is painful, your health care provider may recommend crutches. Use them as told by your health care provider.  General  instructions Take over-the-counter and prescription medicines only as told by your health care provider. Keep all follow-up visits as told by your health care provider. This is important. Contact a health care provider if: You have a fever. The swelling or discoloration gets worse. You develop more hematomas. Get help right away if: Your pain is worse or your pain is not controlled with medicine. Your skin over the hematoma breaks or starts bleeding. Your hematoma is in your chest or abdomen and you have weakness, shortness of breath, or a change in consciousness. You have a hematoma on your scalp that is caused by a fall or injury, and you also have: A headache that gets worse. Trouble speaking or understanding speech. Weakness. Change in alertness or consciousness. Summary A hematoma is a collection of blood under the skin, in an organ, in a body space, in a joint space, or in other tissue. This condition usually does not need treatment because many hematomas go away on their own over time. Large hematomas, or those that may affect vital organs, may need surgical drainage or monitoring. If the hematoma is caused by a medical condition, medicines may be prescribed. Get help right away if your hematoma breaks or starts to bleed, you have shortness of breath, or you have a headache or trouble speaking after a fall. This information is not intended to replace advice given to you by your health care provider. Make sure you discuss any questions you have with your healthcare provider. Document Revised: 07/25/2018 Document Reviewed: 08/03/2017 Elsevier Patient Education  2022 [employer]  Hypokalemia Hypokalemia means that the amount of potassium in the blood is lower than normal. Potassium is a chemical (electrolyte) that helps regulate the amount of fluid in the body. It also stimulates muscle tightening (contraction) and helps nerves work properly. Normally, most of the body's potassium  is inside cells, and only a very small amount is in the blood. Because the amount in the blood is so small, minorchanges to potassium levels in the blood can be life-threatening. What are the causes? This condition may be caused by: Antibiotic medicine. Diarrhea or vomiting. Taking too much of a medicine that helps you have a bowel movement (laxative) can cause diarrhea and lead to hypokalemia. Chronic kidney disease (CKD). Medicines that help the body get rid of excess fluid (diuretics). Eating disorders, such as bulimia. Low magnesium levels in the body. Sweating a lot. What are the signs or symptoms? Symptoms of this condition include: Weakness. Constipation. Fatigue. Muscle cramps. Mental confusion. Skipped heartbeats or irregular heartbeat (palpitations). Tingling or numbness. How is this diagnosed? This condition is diagnosed with a blood test. How is this treated? This condition may be treated by: Taking potassium supplements by mouth. Adjusting the medicines that you take. Eating more foods that contain a lot of potassium. If your potassium level is very low, you may need to get potassium through anIV and be monitored in the hospital. Follow these instructions at home:  Take over-the-counter and prescription medicines only as told by your health care  provider. This includes vitamins and supplements. Eat a healthy diet. A healthy diet includes fresh fruits and vegetables, whole grains, healthy fats, and lean proteins. If instructed, eat more foods that contain a lot of potassium. This includes: Nuts, such as peanuts and pistachios. Seeds, such as sunflower seeds and pumpkin seeds. Peas, lentils, and lima beans. Whole grain and bran cereals and breads. Fresh fruits and vegetables, such as apricots, avocado, bananas, cantaloupe, kiwi, oranges, tomatoes, asparagus, and potatoes. Orange juice. Tomato juice. Red meats. Yogurt. Keep all follow-up visits as told by your  health care provider. This is important. Contact a health care provider if you: Have weakness that gets worse. Feel your heart pounding or racing. Vomit. Have diarrhea. Have diabetes (diabetes mellitus) and you have trouble keeping your blood sugar (glucose) in your target range. Get help right away if you: Have chest pain. Have shortness of breath. Have vomiting or diarrhea that lasts for more than 2 days. Faint. Summary Hypokalemia means that the amount of potassium in the blood is lower than normal. This condition is diagnosed with a blood test. Hypokalemia may be treated by taking potassium supplements, adjusting the medicines that you take, or eating more foods that are high in potassium. If your potassium level is very low, you may need to get potassium through an IV and be monitored in the hospital. This information is not intended to replace advice given to you by your health care provider. Make sure you discuss any questions you have with your healthcare provider. Document Revised: 10/11/2017 Document Reviewed: 10/11/2017 Elsevier Patient Education  2022 [employer]  Prediabetes Eating Plan Prediabetes is a condition that causes blood sugar (glucose) levels to be higher than normal. This increases the risk for developing type 2 diabetes (type 2 diabetes mellitus). Working with a health care provider or nutrition specialist (dietitian) to make diet and lifestyle changes can help prevent the onset of diabetes. These changes may help you: Control your blood glucose levels. Improve your cholesterol levels. Manage your blood pressure. What are tips for following this plan? Reading food labels Read food labels to check the amount of fat, salt (sodium), and sugar in prepackaged foods. Avoid foods that have: Saturated fats. Trans fats. Added sugars. Avoid foods that have more than 300 milligrams (mg) of sodium per serving. Limit your sodium intake to less than 2,300 mg each  day. Shopping Avoid buying pre-made and processed foods. Avoid buying drinks with added sugar. Cooking Cook with olive oil. Do not use butter, lard, or ghee. Bake, broil, grill, steam, or boil foods. Avoid frying. Meal planning  Work with your dietitian to create an eating plan that is right for you. This may include tracking how many calories you take in each day. Use a food diary, notebook, or mobile application to track what you eat at each meal. Consider following a Mediterranean diet. This includes: Eating several servings of fresh fruits and vegetables each day. Eating fish at least twice a week. Eating one serving each day of whole grains, beans, nuts, and seeds. Using olive oil instead of other fats. Limiting alcohol. Limiting red meat. Using nonfat or low-fat dairy products. Consider following a plant-based diet. This includes dietary choices that focus on eating mostly vegetables and fruit, grains, beans, nuts, and seeds. If you have high blood pressure, you may need to limit your sodium intake or follow a diet such as the DASH (Dietary Approaches to Stop Hypertension) eating plan. The DASH diet aims to lower high blood  pressure.  Lifestyle Set weight loss goals with help from your health care team. It is recommended that most people with prediabetes lose 7% of their body weight. Exercise for at least 30 minutes 5 or more days a week. Attend a support group or seek support from a mental health counselor. Take over-the-counter and prescription medicines only as told by your health care provider. What foods are recommended? Fruits Berries. Bananas. Apples. Oranges. Grapes. Papaya. Mango. Pomegranate. Kiwi.Grapefruit. Cherries. Vegetables Lettuce. Spinach. Peas. Beets. Cauliflower. Cabbage. Broccoli. Carrots.Tomatoes. Squash. Eggplant. Herbs. Peppers. Onions. Cucumbers. Brussels sprouts. Grains Whole grains, such as whole-wheat or whole-grain breads, crackers, cereals, and  pasta. Unsweetened oatmeal. Bulgur. Barley. Quinoa. Brown rice. Corn orwhole-wheat flour tortillas or taco shells. Meats and other proteins Seafood. Poultry without skin. Lean cuts of pork and beef. Tofu. Eggs. Nuts.Beans. Dairy Low-fat or fat-free dairy products, such as yogurt, cottage cheese, and cheese. Beverages Water. Tea. Coffee. Sugar-free or diet soda. Seltzer water. Low-fat or nonfatmilk. Milk alternatives, such as soy or almond milk. Fats and oils Olive oil. Canola oil. Sunflower oil. Grapeseed oil. Avocado. Walnuts. Sweets and desserts Sugar-free or low-fat pudding. Sugar-free or low-fat ice cream and other frozentreats. Seasonings and condiments Herbs. Sodium-free spices. Mustard. Relish. Low-salt, low-sugar ketchup.Low-salt, low-sugar barbecue sauce. Low-fat or fat-free mayonnaise. The items listed above may not be a complete list of recommended foods and beverages. Contact a dietitian for more information. What foods are not recommended? Fruits Fruits canned with syrup. Vegetables Canned vegetables. Frozen vegetables with butter or cream sauce. Grains Refined white flour and flour products, such as bread, pasta, snack foods, andcereals. Meats and other proteins Fatty cuts of meat. Poultry with skin. Breaded or fried meat. Processed meats. Dairy Full-fat yogurt, cheese, or milk. Beverages Sweetened drinks, such as iced tea and soda. Fats and oils Butter. Lard. Ghee. Sweets and desserts Baked goods, such as cake, cupcakes, pastries, cookies, and cheesecake. Seasonings and condiments Spice mixes with added salt. Ketchup. Barbecue sauce. Mayonnaise. The items listed above may not be a complete list of foods and beverages that are not recommended. Contact a dietitian for more information. Where to find more information American Diabetes Association: www.diabetes.org Summary You may need to make diet and lifestyle changes to help prevent the onset of diabetes. These  changes can help you control blood sugar, improve cholesterol levels, and manage blood pressure. Set weight loss goals with help from your health care team. It is recommended that most people with prediabetes lose 7% of their body weight. Consider following a Mediterranean diet. This includes eating plenty of fresh fruits and vegetables, whole grains, beans, nuts, seeds, fish, and low-fat dairy, and using olive oil instead of other fats. This information is not intended to replace advice given to you by your health care provider. Make sure you discuss any questions you have with your healthcare provider. Document Revised: 05/30/2019 Document Reviewed: 05/30/2019 Elsevier Patient Education  2022 [employer]  Urinary Tract Infection, Adult  A urinary tract infection (UTI) is an infection of any part of the urinary tract. The urinary tract includes the kidneys, ureters, bladder, and urethra.These organs make, store, and get rid of urine in the body. An upper UTI affects the ureters and kidneys. A lower UTI affects the bladderand urethra. What are the causes? Most urinary tract infections are caused by bacteria in your genital area around your urethra, where urine leaves your body. These bacteria grow andcause inflammation of your urinary tract. What increases the risk? You are more  likely to develop this condition if: You have a urinary catheter that stays in place. You are not able to control when you urinate or have a bowel movement (incontinence). You are female and you: Use a spermicide or diaphragm for birth control. Have low estrogen levels. Are pregnant. You have certain genes that increase your risk. You are sexually active. You take antibiotic medicines. You have a condition that causes your flow of urine to slow down, such as: An enlarged prostate, if you are female. Blockage in your urethra. A kidney stone. A nerve condition that affects your bladder control (neurogenic  bladder). Not getting enough to drink, or not urinating often. You have certain medical conditions, such as: Diabetes. A weak disease-fighting system (immunesystem). Sickle cell disease. Gout. Spinal cord injury. What are the signs or symptoms? Symptoms of this condition include: Needing to urinate right away (urgency). Frequent urination. This may include small amounts of urine each time you urinate. Pain or burning with urination. Blood in the urine. Urine that smells bad or unusual. Trouble urinating. Cloudy urine. Vaginal discharge, if you are female. Pain in the abdomen or the lower back. You may also have: Vomiting or a decreased appetite. Confusion. Irritability or tiredness. A fever or chills. Diarrhea. The first symptom in older adults may be confusion. In some cases, they may nothave any symptoms until the infection has worsened. How is this diagnosed? This condition is diagnosed based on your medical history and a physical exam. You may also have other tests, including: Urine tests. Blood tests. Tests for STIs (sexually transmitted infections). If you have had more than one UTI, a cystoscopy or imaging studies may be doneto determine the cause of the infections. How is this treated? Treatment for this condition includes: Antibiotic medicine. Over-the-counter medicines to treat discomfort. Drinking enough water to stay hydrated. If you have frequent infections or have other conditions such as a kidney stone, you may need to see a health care provider who specializes in the urinary tract (urologist). In rare cases, urinary tract infections can cause sepsis. Sepsis is a life-threatening condition that occurs when the body responds to an infection. Sepsis is treated in the hospital with IV antibiotics, fluids, and othermedicines. Follow these instructions at home:  Medicines Take over-the-counter and prescription medicines only as told by your health care  provider. If you were prescribed an antibiotic medicine, take it as told by your health care provider. Do not stop using the antibiotic even if you start to feel better. General instructions Make sure you: Empty your bladder often and completely. Do not hold urine for long periods of time. Empty your bladder after sex. Wipe from front to back after urinating or having a bowel movement if you are female. Use each tissue only one time when you wipe. Drink enough fluid to keep your urine pale yellow. Keep all follow-up visits. This is important. Contact a health care provider if: Your symptoms do not get better after 1-2 days. Your symptoms go away and then return. Get help right away if: You have severe pain in your back or your lower abdomen. You have a fever or chills. You have nausea or vomiting. Summary A urinary tract infection (UTI) is an infection of any part of the urinary tract, which includes the kidneys, ureters, bladder, and urethra. Most urinary tract infections are caused by bacteria in your genital area. Treatment for this condition often includes antibiotic medicines. If you were prescribed an antibiotic medicine, take it as told  by your health care provider. Do not stop using the antibiotic even if you start to feel better. Keep all follow-up visits. This is important. This information is not intended to replace advice given to you by your health care provider. Make sure you discuss any questions you have with your healthcare provider. Document Revised: 10/11/2019 Document Reviewed: 10/11/2019 Elsevier Patient Education  2022 [employer]

## 2020-10-01 LAB — URINALYSIS, ROUTINE W REFLEX MICROSCOPIC
Bacteria, UA: NONE SEEN /HPF
Bilirubin Urine: NEGATIVE
Glucose, UA: NEGATIVE
Hgb urine dipstick: NEGATIVE
Hyaline Cast: NONE SEEN /LPF
Ketones, ur: NEGATIVE
Nitrite: NEGATIVE
Protein, ur: NEGATIVE
Specific Gravity, Urine: 1.019 (ref 1.001–1.035)
pH: 6 (ref 5.0–8.0)

## 2020-10-01 LAB — URINE CULTURE
MICRO NUMBER:: 12136413
SPECIMEN QUALITY:: ADEQUATE

## 2020-10-01 LAB — MICROSCOPIC MESSAGE

## 2020-10-05 ENCOUNTER — Other Ambulatory Visit: Payer: Self-pay | Admitting: Internal Medicine

## 2020-10-05 DIAGNOSIS — B373 Candidiasis of vulva and vagina: Secondary | ICD-10-CM

## 2020-10-05 DIAGNOSIS — B3731 Acute candidiasis of vulva and vagina: Secondary | ICD-10-CM

## 2020-10-05 DIAGNOSIS — N390 Urinary tract infection, site not specified: Secondary | ICD-10-CM

## 2020-10-05 DIAGNOSIS — B951 Streptococcus, group B, as the cause of diseases classified elsewhere: Secondary | ICD-10-CM

## 2020-10-05 DIAGNOSIS — B955 Unspecified streptococcus as the cause of diseases classified elsewhere: Secondary | ICD-10-CM

## 2020-10-05 MED ORDER — AMOXICILLIN-POT CLAVULANATE 875-125 MG PO TABS: 1.0000 | ORAL_TABLET | Freq: Two times a day (BID) | ORAL | 0 refills | Status: DC

## 2020-10-05 MED ORDER — FLUCONAZOLE 150 MG PO TABS: 150.0000 mg | ORAL_TABLET | Freq: Once | ORAL | 0 refills | Status: AC

## 2020-10-07 ENCOUNTER — Other Ambulatory Visit (INDEPENDENT_AMBULATORY_CARE_PROVIDER_SITE_OTHER): Payer: Medicare Other

## 2020-10-07 ENCOUNTER — Ambulatory Visit (INDEPENDENT_AMBULATORY_CARE_PROVIDER_SITE_OTHER): Payer: Medicare Other | Admitting: *Deleted

## 2020-10-07 ENCOUNTER — Other Ambulatory Visit: Payer: Self-pay

## 2020-10-07 DIAGNOSIS — E876 Hypokalemia: Secondary | ICD-10-CM | POA: Diagnosis not present

## 2020-10-07 DIAGNOSIS — Z23 Encounter for immunization: Secondary | ICD-10-CM | POA: Diagnosis not present

## 2020-10-08 LAB — BASIC METABOLIC PANEL
BUN: 6 mg/dL (ref 6–23)
CO2: 28 mEq/L (ref 19–32)
Calcium: 8.8 mg/dL (ref 8.4–10.5)
Chloride: 102 mEq/L (ref 96–112)
Creatinine, Ser: 0.74 mg/dL (ref 0.40–1.20)
GFR: 84.68 mL/min (ref >60.00)
Glucose, Bld: 75 mg/dL (ref 70–99)
Potassium: 3.7 mEq/L (ref 3.5–5.1)
Sodium: 137 mEq/L (ref 135–145)

## 2020-10-08 LAB — MAGNESIUM: Magnesium: 1.8 mg/dL (ref 1.5–2.5)

## 2020-10-16 DIAGNOSIS — H401131 Primary open-angle glaucoma, bilateral, mild stage: Secondary | ICD-10-CM | POA: Diagnosis not present

## 2020-10-16 DIAGNOSIS — H16223 Keratoconjunctivitis sicca, not specified as Sjogren's, bilateral: Secondary | ICD-10-CM | POA: Diagnosis not present

## 2020-10-23 ENCOUNTER — Other Ambulatory Visit: Payer: Self-pay | Admitting: Internal Medicine

## 2020-10-23 DIAGNOSIS — Z79899 Other long term (current) drug therapy: Secondary | ICD-10-CM | POA: Diagnosis not present

## 2020-10-23 DIAGNOSIS — Z8616 Personal history of COVID-19: Secondary | ICD-10-CM | POA: Diagnosis not present

## 2020-10-23 DIAGNOSIS — I10 Essential (primary) hypertension: Secondary | ICD-10-CM | POA: Diagnosis not present

## 2020-10-23 DIAGNOSIS — R079 Chest pain, unspecified: Secondary | ICD-10-CM | POA: Diagnosis not present

## 2020-11-10 ENCOUNTER — Encounter: Payer: Self-pay | Admitting: Internal Medicine

## 2020-11-10 ENCOUNTER — Other Ambulatory Visit: Payer: Self-pay

## 2020-11-10 ENCOUNTER — Ambulatory Visit (INDEPENDENT_AMBULATORY_CARE_PROVIDER_SITE_OTHER): Payer: Medicare Other | Admitting: Internal Medicine

## 2020-11-10 DIAGNOSIS — I10 Essential (primary) hypertension: Secondary | ICD-10-CM | POA: Diagnosis not present

## 2020-11-10 DIAGNOSIS — M549 Dorsalgia, unspecified: Secondary | ICD-10-CM | POA: Diagnosis not present

## 2020-11-10 DIAGNOSIS — I700 Atherosclerosis of aorta: Secondary | ICD-10-CM | POA: Diagnosis not present

## 2020-11-10 DIAGNOSIS — G8929 Other chronic pain: Secondary | ICD-10-CM

## 2020-11-10 DIAGNOSIS — M545 Low back pain, unspecified: Secondary | ICD-10-CM

## 2020-11-10 DIAGNOSIS — R7303 Prediabetes: Secondary | ICD-10-CM

## 2020-11-10 DIAGNOSIS — M1990 Unspecified osteoarthritis, unspecified site: Secondary | ICD-10-CM | POA: Diagnosis not present

## 2020-11-10 MED ORDER — DICLOFENAC SODIUM 1 % EX GEL: 2.0000 g | Freq: Four times a day (QID) | CUTANEOUS | Status: AC

## 2020-11-10 MED ORDER — METOPROLOL SUCCINATE ER 50 MG PO TB24: 25.0000 mg | ORAL_TABLET | Freq: Every day | ORAL | 1 refills | Status: DC

## 2020-11-10 MED ORDER — TRAMADOL HCL 50 MG PO TABS: 50.0000 mg | ORAL_TABLET | Freq: Two times a day (BID) | ORAL | 2 refills | Status: DC | PRN

## 2020-11-10 MED ORDER — AMLODIPINE BESYLATE 2.5 MG PO TABS: 2.5000 mg | ORAL_TABLET | Freq: Every day | ORAL | 1 refills | Status: DC

## 2020-11-10 NOTE — Progress Notes (Signed)
Subjective:  Patient ID: Sherri Lee, female    DOB: [birth year]  Age: 65 y.o. MRN: [id number]  CC: Diagnoses of Mid back pain, Arthritis, Other chronic pain, Essential hypertension, Prediabetes, Low back pain associated with a spinal disorder other than radiculopathy or spinal stenosis, and Aortic atherosclerosis (HCC) were pertinent to this visit.  HPI Sherri Lee presents for  Chief Complaint  Patient presents with   Follow-up    Following up on hypertension and adding the Amlodipine 2.5 mg. Pt also having trouble since a fall with her left hip, when getting up drom sitting position there is pain.    LEFT HIP PAIN STARTED AFTER A FALL in the dark on a concrete gutter drain.  Bruised SI area and left hip Saw Dr [employer].   Now has stiffness in left hip with    Htn:  METOPROLOL STOPPED BY CARDIOLOGIST due to fatigue reported by patient.    AMLODIPINE  DOSE INCREASED TO 5 MG .during august visit.  Has noted increased edema in lower legs.      Hands constantly hurting  bilateral thumb pain and numbness radiates to wrist.  Says she saw [place] 3 years ago , told it was too late"  using tramadol currently bid  some days needs 3/daily along with tylenol.     Diagnosed with glaucoma recently in both eyes.  By Dr Georgianne Fick ar RTP   Reviewed findings of prior CT scan today..  Patient is  tolerating atorvastatin     Outpatient Medications Prior to Visit  Medication Sig Dispense Refill   acyclovir (ZOVIRAX) 400 MG tablet Take 1 tablet (400 mg total) by mouth 5 (five) times daily. 35 tablet 3   albuterol (VENTOLIN HFA) 108 (90 Base) MCG/ACT inhaler TAKE 2 PUFFS BY MOUTH EVERY 6 HOURS AS NEEDED FOR WHEEZE OR SHORTNESS OF BREATH 8.5 each 2   atorvastatin (LIPITOR) 20 MG tablet Take 1 tablet (20 mg total) by mouth daily. 90 tablet 3   calcium carbonate (OS-CAL) 600 MG TABS Take 600 mg by mouth 2 (two) times daily with a meal.     conjugated estrogens (PREMARIN) vaginal cream Place 1  Applicatorful vaginally every 30 (thirty) days. 2-3 x per month     escitalopram (LEXAPRO) 20 MG tablet Take 20 mg by mouth daily.      fluticasone (FLONASE) 50 MCG/ACT nasal spray PLACE 2 SPRAYS INTO BOTH NOSTRILS DAILY AS NEEDED FOR ALLERGIES OR RHINITIS. AFTER NASAL SALINE 48 mL 0   gabapentin (NEURONTIN) 100 MG capsule Take 1 capsule (100 mg total) by mouth 3 (three) times daily. 270 capsule 3   LORazepam (ATIVAN) 1 MG tablet TAKE 1 TABLET BY MOUTH TWICE A DAY AS DIRECTED FOR ANXIETY/PANIC  2   Multiple Vitamin (MULTIVITAMIN) tablet Take 1 tablet by mouth daily. Bariatric Vitamin     pantoprazole (PROTONIX) 40 MG tablet TAKE 1 TABLET BY MOUTH EVERY DAY 90 tablet 3   potassium chloride SA (KLOR-CON) 20 MEQ tablet Take 1 tablet (20 mEq total) by mouth daily. 5 tablet 0   VITAMIN D PO Take by mouth.     zolpidem (AMBIEN) 10 MG tablet Take 10 mg by mouth at bedtime as needed for sleep.     amLODipine (NORVASC) 5 MG tablet Take 5 mg by mouth daily.     traMADol (ULTRAM) 50 MG tablet Take 1 tablet (50 mg total) by mouth 2 (two) times daily as needed for moderate pain. 60 tablet 5   latanoprost (XALATAN)  0.005 % ophthalmic solution 1 drop at bedtime.     amLODipine (NORVASC) 2.5 MG tablet Take 1 tablet (2.5 mg total) by mouth daily. (Patient not taking: Reported on 11/10/2020) 90 tablet 1   amoxicillin-clavulanate (AUGMENTIN) 875-125 MG tablet Take 1 tablet by mouth 2 (two) times daily. With food (Patient not taking: Reported on 11/10/2020) 14 tablet 0   Dextromethorphan-Guaifenesin 60-1200 MG 12hr tablet Take 1 tablet by mouth every 12 (twelve) hours. Cough (Patient not taking: No sig reported) 60 tablet 0   metoprolol succinate (TOPROL-XL) 50 MG 24 hr tablet TAKE 1 TABLET BY MOUTH EVERY DAY WITH OR IMMEDIATELY FOLLOWING A MEAL (Patient not taking: Reported on 11/10/2020) 90 tablet 1   No facility-administered medications prior to visit.    Review of Systems;  Patient denies headache, fevers,  malaise, unintentional weight loss, skin rash, eye pain, sinus congestion and sinus pain, sore throat, dysphagia,  hemoptysis , cough, dyspnea, wheezing, chest pain, palpitations, orthopnea, edema, abdominal pain, nausea, melena, diarrhea, constipation, flank pain, dysuria, hematuria, urinary  Frequency, nocturia, numbness, tingling, seizures,  Focal weakness, Loss of consciousness,  Tremor, insomnia, depression, anxiety, and suicidal ideation.      Objective:  BP 126/84 (BP Location: Left Arm, Patient Position: Sitting, Cuff Size: Normal)   Pulse (!) 103   Temp (!) 96.9 F (36.1 C) (Temporal)   Ht 5\' 5"  (1.651 m)   Wt 171 lb 9.6 oz (77.8 kg)   SpO2 98%   BMI 28.56 kg/m   BP Readings from Last 3 Encounters:  11/10/20 126/84  09/29/20 122/78  08/04/20 (!) 140/104    Wt Readings from Last 3 Encounters:  11/10/20 171 lb 9.6 oz (77.8 kg)  09/29/20 170 lb 3.2 oz (77.2 kg)  08/04/20 171 lb 12.8 oz (77.9 kg)    General appearance: alert, cooperative and appears stated age Ears: normal TM's and external ear canals both ears Throat: lips, mucosa, and tongue normal; teeth and gums normal Neck: no adenopathy, no carotid bruit, supple, symmetrical, trachea midline and thyroid not enlarged, symmetric, no tenderness/mass/nodules Back: symmetric, no curvature. ROM normal. No CVA tenderness. Lungs: clear to auscultation bilaterally Heart: regular rate and rhythm, S1, S2 normal, no murmur, click, rub or gallop Abdomen: soft, non-tender; bowel sounds normal; no masses,  no organomegaly Pulses: 2+ and symmetric Skin: Skin color, texture, turgor normal. No rashes or lesions MSK: has full ROM hips,  some SI joint pain with internal rotation  Lymph nodes: Cervical, supraclavicular, and axillary nodes normal.  Lab Results  Component Value Date   HGBA1C 6.4 09/01/2020   HGBA1C 5.9 05/28/2020   HGBA1C 5.7 07/29/2019    Lab Results  Component Value Date   CREATININE 0.74 10/07/2020    CREATININE 0.61 09/01/2020   CREATININE 0.69 05/28/2020    Lab Results  Component Value Date   WBC 7.5 09/01/2020   HGB 11.1 (L) 09/01/2020   HCT 33.6 (L) 09/01/2020   PLT 219.0 09/01/2020   GLUCOSE 75 10/07/2020   CHOL 174 09/01/2020   TRIG 55.0 09/01/2020   HDL 84.90 09/01/2020   LDLDIRECT 99.0 09/04/2015   LDLCALC 79 09/01/2020   ALT 18 09/01/2020   AST 16 09/01/2020   NA 137 10/07/2020   K 3.7 10/07/2020   CL 102 10/07/2020   CREATININE 0.74 10/07/2020   BUN 6 10/07/2020   CO2 28 10/07/2020   TSH 0.59 07/29/2019   HGBA1C 6.4 09/01/2020   MICROALBUR 0.6 09/04/2015    CT Abdomen Pelvis W Contrast  Result Date: 06/30/2020 CLINICAL DATA:  Right upper quadrant pain for 2-3 weeks. Nausea. History of gastric bypass surgery and cholecystectomy. EXAM: CT ABDOMEN AND PELVIS WITH CONTRAST TECHNIQUE: Multidetector CT imaging of the abdomen and pelvis was performed using the standard protocol following bolus administration of intravenous contrast. CONTRAST:  OMNIPAQUE IOHEXOL 300 MG/ML  SOLN COMPARISON:  CT 04/26/2013 FINDINGS: Lower chest: Included lung bases are clear.  Heart size is normal. Hepatobiliary: No focal liver abnormality is seen. Status post cholecystectomy. No biliary dilatation. Pancreas: Unremarkable. No pancreatic ductal dilatation or surrounding inflammatory changes. Spleen: Normal in size without focal abnormality. Adrenals/Urinary Tract: Unremarkable adrenal glands. Kidneys enhance symmetrically without focal lesion, stone, or hydronephrosis. Ureters are nondilated. Urinary bladder appears unremarkable for the degree of distension. Stomach/Bowel: Postsurgical changes from gastric bypass. Oral contrast is present throughout the bowel. No evidence of obstruction. No focal bowel wall thickening or inflammatory changes. Normal retrocecal appendix (series 4, image 78). Vascular/Lymphatic: Scattered aortoiliac atherosclerotic calcifications without aneurysm. Retroaortic  left renal vein. No abdominopelvic lymphadenopathy. Reproductive: Uterus and bilateral adnexa are unremarkable. Other: No free fluid. No abdominopelvic fluid collection. No pneumoperitoneum. No abdominal wall hernia. Musculoskeletal: No acute or significant osseous findings. IMPRESSION: 1. No acute abdominopelvic findings. 2. Postsurgical changes from gastric bypass and cholecystectomy. 3. Aortic atherosclerosis (ICD10-I70.0). Electronically Signed   By: Duanne Guess D.O.   On: 06/30/2020 10:19    Assessment & Plan:   Problem List Items Addressed This Visit       Unprioritized   Essential hypertension    Reducing metoprolol to 2.5 mg and resuming metoprolol at 25 mg        Relevant Medications   metoprolol succinate (TOPROL-XL) 50 MG 24 hr tablet   amLODipine (NORVASC) 2.5 MG tablet   Prediabetes    A1c is nearly diagnostic of type 2 DM.  She has gained weight due to lack of exercise. I have addressed  BMI and recomm2ended a low glycemic index diet utilizing smaller more frequent meals to increase metabolism and prevent enlargement of stomach .  I have also recommended that patient start exercising with a goal of 30 minutes of walking a minimum of 5 days per week.   Lab Results  Component Value Date   HGBA1C 6.4 09/01/2020         Low back pain associated with a spinal disorder other than radiculopathy or spinal stenosis    Tramadol refilled.  encouraged to consider PT or resume exercise to strengthen paraspinus muscles      Relevant Medications   traMADol (ULTRAM) 50 MG tablet   Aortic atherosclerosis (HCC)    Reviewed findings of prior CT scan today..  Patient is tolerating high potency statin therapy       Relevant Medications   metoprolol succinate (TOPROL-XL) 50 MG 24 hr tablet   amLODipine (NORVASC) 2.5 MG tablet   Other Visit Diagnoses     Mid back pain       Relevant Medications   traMADol (ULTRAM) 50 MG tablet   Arthritis       Relevant Medications    traMADol (ULTRAM) 50 MG tablet   Other chronic pain       Relevant Medications   traMADol (ULTRAM) 50 MG tablet      Meds ordered this encounter  Medications   metoprolol succinate (TOPROL-XL) 50 MG 24 hr tablet    Sig: Take 0.5 tablets (25 mg total) by mouth daily. Take with or immediately following a meal.  Dispense:  90 tablet    Refill:  1    KEEP ON FILE FOR FUTURE REFILLS note dose reduction   amLODipine (NORVASC) 2.5 MG tablet    Sig: Take 1 tablet (2.5 mg total) by mouth daily.    Dispense:  90 tablet    Refill:  1    KEEP ON FILE FOR FUTURE REFILLS  NOTE DOSE REDUCTION   diclofenac Sodium (VOLTAREN) 1 % GEL    Sig: Apply 2 g topically 4 (four) times daily.    Dispense:  100 g    Refill:  G   traMADol (ULTRAM) 50 MG tablet    Sig: Take 1 tablet (50 mg total) by mouth 2 (two) times daily as needed for moderate pain.    Dispense:  90 tablet    Refill:  2    MAXIMUM 2 DAILY    Medications Discontinued During This Encounter  Medication Reason   amoxicillin-clavulanate (AUGMENTIN) 875-125 MG tablet Completed Course   Dextromethorphan-Guaifenesin 60-1200 MG 12hr tablet    metoprolol succinate (TOPROL-XL) 50 MG 24 hr tablet    amLODipine (NORVASC) 5 MG tablet    amLODipine (NORVASC) 2.5 MG tablet    traMADol (ULTRAM) 50 MG tablet Reorder    Follow-up: Return in about 3 months (around 02/10/2021).   [name], MD

## 2020-11-10 NOTE — Patient Instructions (Addendum)
REDUCE AMLODIPINE TO 2.5 MG DAILY  RESUME METOPROLOL AT 25 MG DOSE (1/2 TABLET)   Tramadol refilled for 3 months  maximum use 3 daily  Consider:  1)  Physical therapy for hip issues  2)  Diclofenac gel for hand pain (available OTC)

## 2020-11-12 DIAGNOSIS — I700 Atherosclerosis of aorta: Secondary | ICD-10-CM | POA: Insufficient documentation

## 2020-11-12 NOTE — Assessment & Plan Note (Signed)
Reviewed findings of prior CT scan today..  Patient is tolerating high potency statin therapy  

## 2020-11-12 NOTE — Assessment & Plan Note (Addendum)
A1c is nearly diagnostic of type 2 DM.  She has gained weight due to lack of exercise. I have addressed  BMI and recomm2ended a low glycemic index diet utilizing smaller more frequent meals to increase metabolism and prevent enlargement of stomach .  I have also recommended that patient start exercising with a goal of 30 minutes of walking a minimum of 5 days per week.   Lab Results  Component Value Date   HGBA1C 6.4 09/01/2020

## 2020-11-12 NOTE — Assessment & Plan Note (Signed)
Reducing metoprolol to 2.5 mg and resuming metoprolol at 25 mg

## 2020-11-12 NOTE — Assessment & Plan Note (Signed)
Tramadol refilled.  encouraged to consider PT or resume exercise to strengthen paraspinus muscles 

## 2020-11-26 DIAGNOSIS — Z79899 Other long term (current) drug therapy: Secondary | ICD-10-CM | POA: Diagnosis not present

## 2020-12-14 ENCOUNTER — Other Ambulatory Visit: Payer: Self-pay | Admitting: Internal Medicine

## 2020-12-14 DIAGNOSIS — J329 Chronic sinusitis, unspecified: Secondary | ICD-10-CM

## 2020-12-21 DIAGNOSIS — R002 Palpitations: Secondary | ICD-10-CM | POA: Diagnosis not present

## 2020-12-21 DIAGNOSIS — I10 Essential (primary) hypertension: Secondary | ICD-10-CM | POA: Diagnosis not present

## 2020-12-21 DIAGNOSIS — R079 Chest pain, unspecified: Secondary | ICD-10-CM | POA: Diagnosis not present

## 2021-01-01 ENCOUNTER — Telehealth: Payer: Medicare Other | Admitting: Physician Assistant

## 2021-01-01 ENCOUNTER — Telehealth: Payer: Self-pay | Admitting: Internal Medicine

## 2021-01-01 DIAGNOSIS — R3989 Other symptoms and signs involving the genitourinary system: Secondary | ICD-10-CM

## 2021-01-01 MED ORDER — CIPROFLOXACIN HCL 500 MG PO TABS: 500.0000 mg | ORAL_TABLET | Freq: Two times a day (BID) | ORAL | 0 refills | Status: AC

## 2021-01-01 NOTE — Telephone Encounter (Signed)
Patient informed, Due to the high volume of calls and your symptoms we have to forward your call to our Triage Nurse to expedient your call. Please hold for the transfer.  Patient transferred to [facility] at Access Nurse. Due to thinking she may possibly have UTI and she would like to speak with a nurse to see if there is something she can take over the counter for this.

## 2021-01-01 NOTE — Patient Instructions (Signed)
Sherri Lee, thank you for joining [name], PA-C for today's virtual visit.  While this provider is not your primary care provider (PCP), if your PCP is located in our provider database this encounter information will be shared with them immediately following your visit.  Consent: (Patient) Sherri Lee provided verbal consent for this virtual visit at the beginning of the encounter.  Current Medications:  Current Outpatient Medications:    ciprofloxacin (CIPRO) 500 MG tablet, Take 1 tablet (500 mg total) by mouth 2 (two) times daily for 5 days., Disp: 10 tablet, Rfl: 0   acyclovir (ZOVIRAX) 400 MG tablet, Take 1 tablet (400 mg total) by mouth 5 (five) times daily., Disp: 35 tablet, Rfl: 3   albuterol (VENTOLIN HFA) 108 (90 Base) MCG/ACT inhaler, TAKE 2 PUFFS BY MOUTH EVERY 6 HOURS AS NEEDED FOR WHEEZE OR SHORTNESS OF BREATH, Disp: 8.5 each, Rfl: 2   amLODipine (NORVASC) 2.5 MG tablet, Take 1 tablet (2.5 mg total) by mouth daily., Disp: 90 tablet, Rfl: 1   atorvastatin (LIPITOR) 20 MG tablet, Take 1 tablet (20 mg total) by mouth daily., Disp: 90 tablet, Rfl: 3   calcium carbonate (OS-CAL) 600 MG TABS, Take 600 mg by mouth 2 (two) times daily with a meal., Disp: , Rfl:    conjugated estrogens (PREMARIN) vaginal cream, Place 1 Applicatorful vaginally every 30 (thirty) days. 2-3 x per month, Disp: , Rfl:    diclofenac Sodium (VOLTAREN) 1 % GEL, Apply 2 g topically 4 (four) times daily., Disp: 100 g, Rfl: G   escitalopram (LEXAPRO) 20 MG tablet, Take 20 mg by mouth daily. , Disp: , Rfl:    fluticasone (FLONASE) 50 MCG/ACT nasal spray, PLACE 2 SPRAYS INTO BOTH NOSTRILS DAILY AS NEEDED FOR ALLERGIES OR RHINITIS. AFTER NASAL SALINE, Disp: 48 mL, Rfl: 0   gabapentin (NEURONTIN) 100 MG capsule, Take 1 capsule (100 mg total) by mouth 3 (three) times daily., Disp: 270 capsule, Rfl: 3   latanoprost (XALATAN) 0.005 % ophthalmic solution, 1 drop at bedtime., Disp: ,  Rfl:    LORazepam (ATIVAN) 1 MG tablet, TAKE 1 TABLET BY MOUTH TWICE A DAY AS DIRECTED FOR ANXIETY/PANIC, Disp: , Rfl: 2   metoprolol succinate (TOPROL-XL) 50 MG 24 hr tablet, Take 0.5 tablets (25 mg total) by mouth daily. Take with or immediately following a meal., Disp: 90 tablet, Rfl: 1   Multiple Vitamin (MULTIVITAMIN) tablet, Take 1 tablet by mouth daily. Bariatric Vitamin, Disp: , Rfl:    pantoprazole (PROTONIX) 40 MG tablet, TAKE 1 TABLET BY MOUTH EVERY DAY, Disp: 90 tablet, Rfl: 3   potassium chloride SA (KLOR-CON) 20 MEQ tablet, Take 1 tablet (20 mEq total) by mouth daily., Disp: 5 tablet, Rfl: 0   traMADol (ULTRAM) 50 MG tablet, Take 1 tablet (50 mg total) by mouth 2 (two) times daily as needed for moderate pain., Disp: 90 tablet, Rfl: 2   VITAMIN D PO, Take by mouth., Disp: , Rfl:    zolpidem (AMBIEN) 10 MG tablet, Take 10 mg by mouth at bedtime as needed for sleep., Disp: , Rfl:    Medications ordered in this encounter:  Meds ordered this encounter  Medications   ciprofloxacin (CIPRO) 500 MG tablet    Sig: Take 1 tablet (500 mg total) by mouth 2 (two) times daily for 5 days.    Dispense:  10 tablet    Refill:  0    Order Specific Question:   Supervising Provider    Answer:  MILLER, BRIAN [[id number]]     *If you need refills on other medications prior to your next appointment, please contact your pharmacy*  Follow-Up: Call back or seek an in-person evaluation if the symptoms worsen or if the condition fails to improve as anticipated.  Other Instructions Urinary Tract Infection, Adult A urinary tract infection (UTI) is an infection of any part of the urinary tract. The urinary tract includes the kidneys, ureters, bladder, and urethra. These organs make, store, and get rid of urine in the body. An upper UTI affects the ureters and kidneys. A lower UTI affects the bladder and urethra. What are the causes? Most urinary tract infections are caused by bacteria in your genital area  around your urethra, where urine leaves your body. These bacteria grow and cause inflammation of your urinary tract. What increases the risk? You are more likely to develop this condition if: You have a urinary catheter that stays in place. You are not able to control when you urinate or have a bowel movement (incontinence). You are female and you: Use a spermicide or diaphragm for birth control. Have low estrogen levels. Are pregnant. You have certain genes that increase your risk. You are sexually active. You take antibiotic medicines. You have a condition that causes your flow of urine to slow down, such as: An enlarged prostate, if you are female. Blockage in your urethra. A kidney stone. A nerve condition that affects your bladder control (neurogenic bladder). Not getting enough to drink, or not urinating often. You have certain medical conditions, such as: Diabetes. A weak disease-fighting system (immunesystem). Sickle cell disease. Gout. Spinal cord injury. What are the signs or symptoms? Symptoms of this condition include: Needing to urinate right away (urgency). Frequent urination. This may include small amounts of urine each time you urinate. Pain or burning with urination. Blood in the urine. Urine that smells bad or unusual. Trouble urinating. Cloudy urine. Vaginal discharge, if you are female. Pain in the abdomen or the lower back. You may also have: Vomiting or a decreased appetite. Confusion. Irritability or tiredness. A fever or chills. Diarrhea. The first symptom in older adults may be confusion. In some cases, they may not have any symptoms until the infection has worsened. How is this diagnosed? This condition is diagnosed based on your medical history and a physical exam. You may also have other tests, including: Urine tests. Blood tests. Tests for STIs (sexually transmitted infections). If you have had more than one UTI, a cystoscopy or imaging  studies may be done to determine the cause of the infections. How is this treated? Treatment for this condition includes: Antibiotic medicine. Over-the-counter medicines to treat discomfort. Drinking enough water to stay hydrated. If you have frequent infections or have other conditions such as a kidney stone, you may need to see a health care provider who specializes in the urinary tract (urologist). In rare cases, urinary tract infections can cause sepsis. Sepsis is a life-threatening condition that occurs when the body responds to an infection. Sepsis is treated in the hospital with IV antibiotics, fluids, and other medicines. Follow these instructions at home: Medicines Take over-the-counter and prescription medicines only as told by your health care provider. If you were prescribed an antibiotic medicine, take it as told by your health care provider. Do not stop using the antibiotic even if you start to feel better. General instructions Make sure you: Empty your bladder often and completely. Do not hold urine for long periods of time. Empty your  bladder after sex. Wipe from front to back after urinating or having a bowel movement if you are female. Use each tissue only one time when you wipe. Drink enough fluid to keep your urine pale yellow. Keep all follow-up visits. This is important. Contact a health care provider if: Your symptoms do not get better after 1-2 days. Your symptoms go away and then return. Get help right away if: You have severe pain in your back or your lower abdomen. You have a fever or chills. You have nausea or vomiting. Summary A urinary tract infection (UTI) is an infection of any part of the urinary tract, which includes the kidneys, ureters, bladder, and urethra. Most urinary tract infections are caused by bacteria in your genital area. Treatment for this condition often includes antibiotic medicines. If you were prescribed an antibiotic medicine, take it  as told by your health care provider. Do not stop using the antibiotic even if you start to feel better. Keep all follow-up visits. This is important. This information is not intended to replace advice given to you by your health care provider. Make sure you discuss any questions you have with your health care provider. Document Revised: 10/11/2019 Document Reviewed: 10/11/2019 Elsevier Patient Education  2022 [employer].    If you have been instructed to have an in-person evaluation today at a local Urgent Care facility, please use the link below. It will take you to a list of all of our available [facility], including address, phone number and hours of operation. Please do not delay care.  [facility]  If you or a family member do not have a primary care provider, use the link below to schedule a visit and establish care. When you choose a [facility] primary care physician or advanced practice provider, you gain a long-term partner in health. Find a Primary Care Provider  Learn more about [facility]'s in-office and virtual care options:  - Get Care Now

## 2021-01-01 NOTE — Telephone Encounter (Signed)
FYI Spoken to patient, access nurse stated for aptient to be evaluated at [facility] or go to walk-in clinic. Patient stated she was going to wait until she could be evaluated within our office. Spoken to patient and was able to talk her into setting up a virtual UC appointment through [facility]. Patient stated she will do that and after setting appointment up we then disconnected phone call.  Have not received and access nurse notes as of yet.

## 2021-01-01 NOTE — Progress Notes (Signed)
Virtual Visit Consent   Sherri Lee, you are scheduled for a virtual visit with a [facility] provider today.     Just as with appointments in the office, your consent must be obtained to participate.  Your consent will be active for this visit and any virtual visit you may have with one of our providers in the next 365 days.     If you have a MyChart account, a copy of this consent can be sent to you electronically.  All virtual visits are billed to your insurance company just like a traditional visit in the office.    As this is a virtual visit, video technology does not allow for your provider to perform a traditional examination.  This may limit your provider's ability to fully assess your condition.  If your provider identifies any concerns that need to be evaluated in person or the need to arrange testing (such as labs, EKG, etc.), we will make arrangements to do so.     Although advances in technology are sophisticated, we cannot ensure that it will always work on either your end or our end.  If the connection with a video visit is poor, the visit may have to be switched to a telephone visit.  With either a video or telephone visit, we are not always able to ensure that we have a secure connection.     I need to obtain your verbal consent now.   Are you willing to proceed with your visit today?    Sherri Lee has provided verbal consent on 01/01/2021 for a virtual visit (video or telephone).   [name], PA-C   Date: 01/01/2021 5:20 PM   Virtual Visit via Video Note   I, [name], connected with  Sherri Lee  ([id number], 1955/08/16) on 01/01/21 at  5:15 PM EDT by a video-enabled telemedicine application and verified that I am speaking with the correct person using two identifiers.  Location: Patient: Virtual Visit Location Patient: Home Provider: Virtual Visit Location Provider: Home Office   I discussed the  limitations of evaluation and management by telemedicine and the availability of in person appointments. The patient expressed understanding and agreed to proceed.    History of Present Illness: Sherri Lee is a 65 y.o. who identifies as a female who was assigned female at birth, and is being seen today for UTI.  HPI: Urinary Tract Infection  This is a new problem. The current episode started 1 to 4 weeks ago. The problem has been unchanged. The quality of the pain is described as aching and burning. The pain is mild. There has been no fever. Associated symptoms include frequency, hesitancy and urgency. Pertinent negatives include no chills. She has tried increased fluids and acetaminophen for the symptoms. The treatment provided no relief. Her past medical history is significant for recurrent UTIs.    Problems:  Patient Active Problem List   Diagnosis Date Noted   Aortic atherosclerosis (HCC) 11/12/2020   Hypokalemia 09/03/2020   Personal history of COVID-19 08/06/2020   RUQ pain 07/03/2020   Encounter for preventive health examination 07/30/2019   Low back pain associated with a spinal disorder other than radiculopathy or spinal stenosis 10/28/2018   Urge incontinence of urine 08/15/2018   Injury of branch of radial nerve 08/15/2018   Skin lesion of right lower extremity 11/04/2017   Adjustment disorder with mixed disturbance of emotions and conduct 03/31/2017   History of benzodiazepine use 03/31/2017  Esophageal stricture 06/19/2016   IBS (irritable bowel syndrome) 09/06/2015   IDA (iron deficiency anemia) 09/06/2015   DDD (degenerative disc disease), cervical 09/24/2014   DDD (degenerative disc disease), thoracic 09/24/2014   Tremor of both hands 09/10/2014   Insomnia due to anxiety and fear 02/26/2013   Alopecia areata 07/11/2012   Cervicalgia 07/11/2012   Degenerative joint disease of thoracic spine 10/04/2011   Essential hypertension 05/10/2011   Prediabetes  05/10/2011   Status post gastric bypass for obesity 05/10/2011    Allergies:  Allergies  Allergen Reactions   Bacitracin-Polymyxin B Rash   Diclofenac Hives   Eggs Or Egg-Derived Products    Betadine [Povidone Iodine] Rash   Medications:  Current Outpatient Medications:    ciprofloxacin (CIPRO) 500 MG tablet, Take 1 tablet (500 mg total) by mouth 2 (two) times daily for 5 days., Disp: 10 tablet, Rfl: 0   acyclovir (ZOVIRAX) 400 MG tablet, Take 1 tablet (400 mg total) by mouth 5 (five) times daily., Disp: 35 tablet, Rfl: 3   albuterol (VENTOLIN HFA) 108 (90 Base) MCG/ACT inhaler, TAKE 2 PUFFS BY MOUTH EVERY 6 HOURS AS NEEDED FOR WHEEZE OR SHORTNESS OF BREATH, Disp: 8.5 each, Rfl: 2   amLODipine (NORVASC) 2.5 MG tablet, Take 1 tablet (2.5 mg total) by mouth daily., Disp: 90 tablet, Rfl: 1   atorvastatin (LIPITOR) 20 MG tablet, Take 1 tablet (20 mg total) by mouth daily., Disp: 90 tablet, Rfl: 3   calcium carbonate (OS-CAL) 600 MG TABS, Take 600 mg by mouth 2 (two) times daily with a meal., Disp: , Rfl:    conjugated estrogens (PREMARIN) vaginal cream, Place 1 Applicatorful vaginally every 30 (thirty) days. 2-3 x per month, Disp: , Rfl:    diclofenac Sodium (VOLTAREN) 1 % GEL, Apply 2 g topically 4 (four) times daily., Disp: 100 g, Rfl: G   escitalopram (LEXAPRO) 20 MG tablet, Take 20 mg by mouth daily. , Disp: , Rfl:    fluticasone (FLONASE) 50 MCG/ACT nasal spray, PLACE 2 SPRAYS INTO BOTH NOSTRILS DAILY AS NEEDED FOR ALLERGIES OR RHINITIS. AFTER NASAL SALINE, Disp: 48 mL, Rfl: 0   gabapentin (NEURONTIN) 100 MG capsule, Take 1 capsule (100 mg total) by mouth 3 (three) times daily., Disp: 270 capsule, Rfl: 3   latanoprost (XALATAN) 0.005 % ophthalmic solution, 1 drop at bedtime., Disp: , Rfl:    LORazepam (ATIVAN) 1 MG tablet, TAKE 1 TABLET BY MOUTH TWICE A DAY AS DIRECTED FOR ANXIETY/PANIC, Disp: , Rfl: 2   metoprolol succinate (TOPROL-XL) 50 MG 24 hr tablet, Take 0.5 tablets (25 mg total) by  mouth daily. Take with or immediately following a meal., Disp: 90 tablet, Rfl: 1   Multiple Vitamin (MULTIVITAMIN) tablet, Take 1 tablet by mouth daily. Bariatric Vitamin, Disp: , Rfl:    pantoprazole (PROTONIX) 40 MG tablet, TAKE 1 TABLET BY MOUTH EVERY DAY, Disp: 90 tablet, Rfl: 3   potassium chloride SA (KLOR-CON) 20 MEQ tablet, Take 1 tablet (20 mEq total) by mouth daily., Disp: 5 tablet, Rfl: 0   traMADol (ULTRAM) 50 MG tablet, Take 1 tablet (50 mg total) by mouth 2 (two) times daily as needed for moderate pain., Disp: 90 tablet, Rfl: 2   VITAMIN D PO, Take by mouth., Disp: , Rfl:    zolpidem (AMBIEN) 10 MG tablet, Take 10 mg by mouth at bedtime as needed for sleep., Disp: , Rfl:   Observations/Objective: Patient is well-developed, well-nourished in no acute distress.  Resting comfortably at home.  Head is  normocephalic, atraumatic.  No labored breathing.  Speech is clear and coherent with logical content.  Patient is alert and oriented at baseline.    Assessment and Plan: 1. Suspected UTI - ciprofloxacin (CIPRO) 500 MG tablet; Take 1 tablet (500 mg total) by mouth 2 (two) times daily for 5 days.  Dispense: 10 tablet; Refill: 0  - Worsening symptoms.  - Will treat empirically with Cipro.  - May use AZO for spasm.  - Continue to push fluids.  - She is to call or seek in person evaluation if symptoms do not improve or if they worsen.   Follow Up Instructions: I discussed the assessment and treatment plan with the patient. The patient was provided an opportunity to ask questions and all were answered. The patient agreed with the plan and demonstrated an understanding of the instructions.  A copy of instructions were sent to the patient via MyChart unless otherwise noted below.    The patient was advised to call back or seek an in-person evaluation if the symptoms worsen or if the condition fails to improve as anticipated.  Time:  I spent 12 minutes with the patient via telehealth  technology discussing the above problems/concerns.    [name], PA-C

## 2021-01-24 ENCOUNTER — Other Ambulatory Visit: Payer: Self-pay | Admitting: Internal Medicine

## 2021-01-24 DIAGNOSIS — G8929 Other chronic pain: Secondary | ICD-10-CM

## 2021-01-24 DIAGNOSIS — M1990 Unspecified osteoarthritis, unspecified site: Secondary | ICD-10-CM

## 2021-01-24 DIAGNOSIS — M549 Dorsalgia, unspecified: Secondary | ICD-10-CM

## 2021-01-25 NOTE — Telephone Encounter (Signed)
RX Refill: tramadol Last Seen: 11-10-20 Last Ordered: 11-10-20 Next Appt: 02-12-21

## 2021-01-29 ENCOUNTER — Emergency Department
Admission: EM | Admit: 2021-01-29 | Discharge: 2021-01-29 | Disposition: A | Discharge status: Home / Self Care | Payer: Medicare Other | Attending: Emergency Medicine | Admitting: Emergency Medicine

## 2021-01-29 ENCOUNTER — Telehealth: Payer: Self-pay | Admitting: Internal Medicine

## 2021-01-29 ENCOUNTER — Other Ambulatory Visit: Payer: Self-pay

## 2021-01-29 ENCOUNTER — Encounter: Payer: Self-pay | Admitting: Intensive Care

## 2021-01-29 ENCOUNTER — Emergency Department: Payer: Medicare Other

## 2021-01-29 DIAGNOSIS — Z7951 Long term (current) use of inhaled steroids: Secondary | ICD-10-CM | POA: Insufficient documentation

## 2021-01-29 DIAGNOSIS — Z8616 Personal history of COVID-19: Secondary | ICD-10-CM | POA: Diagnosis not present

## 2021-01-29 DIAGNOSIS — I10 Essential (primary) hypertension: Secondary | ICD-10-CM | POA: Diagnosis not present

## 2021-01-29 DIAGNOSIS — Z20822 Contact with and (suspected) exposure to covid-19: Secondary | ICD-10-CM | POA: Insufficient documentation

## 2021-01-29 DIAGNOSIS — Z79899 Other long term (current) drug therapy: Secondary | ICD-10-CM | POA: Diagnosis not present

## 2021-01-29 DIAGNOSIS — R519 Headache, unspecified: Secondary | ICD-10-CM | POA: Diagnosis not present

## 2021-01-29 DIAGNOSIS — J45909 Unspecified asthma, uncomplicated: Secondary | ICD-10-CM | POA: Insufficient documentation

## 2021-01-29 LAB — CBC
HCT: 37.4 % (ref 36.0–46.0)
Hemoglobin: 11.9 g/dL — ABNORMAL LOW (ref 12.0–15.0)
MCH: 28 pg (ref 26.0–34.0)
MCHC: 31.8 g/dL (ref 30.0–36.0)
MCV: 88 fL (ref 80.0–100.0)
Platelets: 233 10*3/uL (ref 150–400)
RBC: 4.25 MIL/uL (ref 3.87–5.11)
RDW: 12.7 % (ref 11.5–15.5)
WBC: 5.1 10*3/uL (ref 4.0–10.5)
nRBC: 0 % (ref 0.0–0.2)

## 2021-01-29 LAB — URINALYSIS, ROUTINE W REFLEX MICROSCOPIC
Bilirubin Urine: NEGATIVE
Glucose, UA: NEGATIVE mg/dL
Ketones, ur: NEGATIVE mg/dL
Leukocytes,Ua: NEGATIVE
Nitrite: NEGATIVE
Protein, ur: NEGATIVE mg/dL
Specific Gravity, Urine: 1.011 (ref 1.005–1.030)
pH: 7 (ref 5.0–8.0)

## 2021-01-29 LAB — RESP PANEL BY RT-PCR (FLU A&B, COVID) ARPGX2
Influenza A by PCR: NEGATIVE
Influenza B by PCR: NEGATIVE
SARS Coronavirus 2 by RT PCR: NEGATIVE

## 2021-01-29 LAB — BASIC METABOLIC PANEL
Anion gap: 8 (ref 5–15)
BUN: 7 mg/dL — ABNORMAL LOW (ref 8–23)
CO2: 26 mmol/L (ref 22–32)
Calcium: 9.1 mg/dL (ref 8.9–10.3)
Chloride: 106 mmol/L (ref 98–111)
Creatinine, Ser: 0.65 mg/dL (ref 0.44–1.00)
GFR, Estimated: >60 mL/min (ref >60)
Glucose, Bld: 143 mg/dL — ABNORMAL HIGH (ref 70–99)
Potassium: 3.5 mmol/L (ref 3.5–5.1)
Sodium: 140 mmol/L (ref 135–145)

## 2021-01-29 LAB — TROPONIN I (HIGH SENSITIVITY): Troponin I (High Sensitivity): 5 ng/L (ref <18)

## 2021-01-29 MED ORDER — BUTALBITAL-APAP-CAFFEINE 50-325-40 MG PO TABS
2.0000 | ORAL_TABLET | Freq: Once | ORAL | Status: AC
  Administered 2021-01-29: 2 via ORAL
  Filled 2021-01-29: qty 2

## 2021-01-29 MED ORDER — BUTALBITAL-APAP-CAFFEINE 50-325-40 MG PO TABS: 1.0000 | ORAL_TABLET | Freq: Four times a day (QID) | ORAL | 0 refills | Status: DC | PRN

## 2021-01-29 NOTE — Telephone Encounter (Signed)
Pt is currently at the ED

## 2021-01-29 NOTE — Telephone Encounter (Signed)
Awaiting access nurse note 

## 2021-01-29 NOTE — ED Triage Notes (Signed)
Patient c/o intermittent cp, htn, and blurred vision for a week.

## 2021-01-29 NOTE — ED Provider Notes (Signed)
Emergency Medicine Provider Triage Evaluation Note  Sherri Lee , a 65 y.o. female  was evaluated in triage.  Pt complains of headaches, blurred vision, intermittent headaches. BP has been elevated at home (150/100). Additional endorses chest discomfort, centrally, 2/10 pain. Symptoms started start Sunday night and remained constant. No fever/chills. Urinary frequency and "pressure".   Review of Systems  Positive: Headaches, blurred vision, urinary frequency, chest discomfort. Negative: Abdominal pain, back pain, numbness/tingling in extremities  Physical Exam  There were no vitals taken for this visit. Gen:   Awake, appears anxious Resp:  Normal effort  MSK:   Moves extremities without difficulty  Other:  None.   Medical Decision Making  Medically screening exam initiated at 3:23 PM.  Appropriate orders placed.  [name] was informed that the remainder of the evaluation will be completed by another provider, this initial triage assessment does not replace that evaluation, and the importance of remaining in the ED until their evaluation is complete.  Exam unremarkable. BP elevated at 162/90. Otherwise normal. Will get EKG, urinalysis, and respiratory panel initially to evaluate symptoms. Will go ahead and establish IV here.    Varney Daily, [place] 01/29/21 1542    Merwyn Katos, MD 01/29/21 [phone]

## 2021-01-29 NOTE — ED Provider Notes (Signed)
[facility] Emergency Department Provider Note  Time seen: 5:21 PM  I have reviewed the triage vital signs and the nursing notes.   HISTORY  Chief Complaint Chest Pain and Hypertension   HPI Sherri Lee is a 65 y.o. female with a past medical history of anemia, anxiety, depression, fibromyalgia, hypertension, presents to the emergency department for headaches and high blood pressure.  According to the patient for the past 2 to 3 days she has been experiencing intermittent headaches.  Patient states she checked her blood pressure and it was elevated which concerned her so she came to the emergency department for evaluation.  Currently blood pressure 162/90.  Patient states mild headache.  Denies any weakness or numbness of any arm or leg confusion or slurred speech at any point.  Denies any fever cough or congestion.   Past Medical History:  Diagnosis Date   Anemia    Low iron and hemoglobin   Anginal pain (HCC)    states she thinks it is stress   Anxiety    Asthma    COVID-19    07/05/20   Depression    Dysrhythmia    states she has PVC's   Fibromyalgia    Hypertension    Recurrent vomiting 02/04/2018   Schatzki's ring    dialated 3 times by [facility]     Patient Active Problem List   Diagnosis Date Noted   Aortic atherosclerosis (HCC) 11/12/2020   Hypokalemia 09/03/2020   Personal history of COVID-19 08/06/2020   RUQ pain 07/03/2020   Encounter for preventive health examination 07/30/2019   Low back pain associated with a spinal disorder other than radiculopathy or spinal stenosis 10/28/2018   Urge incontinence of urine 08/15/2018   Injury of branch of radial nerve 08/15/2018   Skin lesion of right lower extremity 11/04/2017   Adjustment disorder with mixed disturbance of emotions and conduct 03/31/2017   History of benzodiazepine use 03/31/2017   Esophageal stricture 06/19/2016   IBS (irritable bowel syndrome) 09/06/2015   IDA  (iron deficiency anemia) 09/06/2015   DDD (degenerative disc disease), cervical 09/24/2014   DDD (degenerative disc disease), thoracic 09/24/2014   Tremor of both hands 09/10/2014   Insomnia due to anxiety and fear 02/26/2013   Alopecia areata 07/11/2012   Cervicalgia 07/11/2012   Degenerative joint disease of thoracic spine 10/04/2011   Essential hypertension 05/10/2011   Prediabetes 05/10/2011   Status post gastric bypass for obesity 05/10/2011    Past Surgical History:  Procedure Laterality Date   ABDOMINAL ADHESION SURGERY     BUNIONECTOMY     left foot    CERVICAL FUSION     CESAREAN SECTION     CHOLECYSTECTOMY  June 2013   GASTRIC BYPASS      Prior to Admission medications   Medication Sig Start Date End Date Taking? Authorizing Provider  acyclovir (ZOVIRAX) 400 MG tablet Take 1 tablet (400 mg total) by mouth 5 (five) times daily. 02/02/18   [name], MD  albuterol (VENTOLIN HFA) 108 (90 Base) MCG/ACT inhaler TAKE 2 PUFFS BY MOUTH EVERY 6 HOURS AS NEEDED FOR WHEEZE OR SHORTNESS OF BREATH 09/29/20   McLean-Scocuzza, Pasty Spillers, MD  amLODipine (NORVASC) 2.5 MG tablet Take 1 tablet (2.5 mg total) by mouth daily. 11/10/20   [name], MD  atorvastatin (LIPITOR) 20 MG tablet Take 1 tablet (20 mg total) by mouth daily. 07/01/20   [name], MD  calcium carbonate (OS-CAL) 600 MG TABS  Take 600 mg by mouth 2 (two) times daily with a meal.    [provider]  conjugated estrogens (PREMARIN) vaginal cream Place 1 Applicatorful vaginally every 30 (thirty) days. 2-3 x per month    [provider]  diclofenac Sodium (VOLTAREN) 1 % GEL Apply 2 g topically 4 (four) times daily. 11/10/20   Crecencio Mc, MD  escitalopram (LEXAPRO) 20 MG tablet Take 20 mg by mouth daily.     [provider]  fluticasone (FLONASE) 50 MCG/ACT nasal spray PLACE 2 SPRAYS INTO BOTH NOSTRILS DAILY AS NEEDED FOR ALLERGIES OR RHINITIS. AFTER NASAL SALINE 12/14/20   Crecencio Mc, MD  gabapentin (NEURONTIN) 100 MG capsule Take 1 capsule (100 mg total) by mouth 3 (three) times daily. 08/04/20   Crecencio Mc, MD  latanoprost (XALATAN) 0.005 % ophthalmic solution 1 drop at bedtime. 10/16/20   [provider]  LORazepam (ATIVAN) 1 MG tablet TAKE 1 TABLET BY MOUTH TWICE A DAY AS DIRECTED FOR ANXIETY/PANIC 06/21/17   [provider]  metoprolol succinate (TOPROL-XL) 50 MG 24 hr tablet Take 0.5 tablets (25 mg total) by mouth daily. Take with or immediately following a meal. 11/10/20   Crecencio Mc, MD  Multiple Vitamin (MULTIVITAMIN) tablet Take 1 tablet by mouth daily. Bariatric Vitamin    [provider]  pantoprazole (PROTONIX) 40 MG tablet TAKE 1 TABLET BY MOUTH EVERY DAY 10/26/20   Crecencio Mc, MD  potassium chloride SA (KLOR-CON) 20 MEQ tablet Take 1 tablet (20 mEq total) by mouth daily. 09/03/20   Crecencio Mc, MD  traMADol (ULTRAM) 50 MG tablet TAKE 1 TABLET (50 MG TOTAL) BY MOUTH 2 (TWO) TIMES DAILY AS NEEDED FOR MODERATE PAIN. 01/25/21   Crecencio Mc, MD  VITAMIN D PO Take by mouth.    [provider]  zolpidem (AMBIEN) 10 MG tablet Take 10 mg by mouth at bedtime as needed for sleep.    [provider]    Allergies  Allergen Reactions   Bacitracin-Polymyxin B Rash   Diclofenac Hives   Eggs Or Egg-Derived Products    Betadine [Povidone Iodine] Rash    Family History  Problem Relation Age of Onset   Hypertension Mother    Glaucoma Mother    Heart disease Father    Stroke Father    Breast cancer Maternal Aunt    Cancer Maternal Aunt    Colon cancer Neg Hx    Ovarian cancer Neg Hx     Social History Social History   Tobacco Use   Smoking status: Never   Smokeless tobacco: Never  Substance Use Topics   Alcohol use: Yes    Alcohol/week: 3.0 standard drinks    Types: 3 Glasses of wine per week   Drug use: No    Review of Systems Constitutional: Negative for fever. Cardiovascular: Negative for  chest pain. Respiratory: Negative for shortness of breath. Gastrointestinal: Negative for abdominal pain, vomiting Musculoskeletal: Negative for musculoskeletal complaints Neurological: Intermittent moderate headache.  No neurologic symptoms. All other ROS negative  ____________________________________________   PHYSICAL EXAM:  VITAL SIGNS: ED Triage Vitals  Enc Vitals Group     BP 01/29/21 1531 (!) 162/90     Pulse Rate 01/29/21 1531 77     Resp 01/29/21 1531 20     Temp 01/29/21 1531 98.1 F (36.7 C)     Temp Source 01/29/21 1531 Oral     SpO2 01/29/21 1531 98 %  Weight 01/29/21 1532 167 lb (75.8 kg)     Height 01/29/21 1532 5\' 5"  (1.651 m)     Head Circumference --      Peak Flow --      Pain Score 01/29/21 1531 1     Pain Loc --      Pain Edu? --      Excl. in [place]? --     Constitutional: Alert and oriented. Well appearing and in no distress. Eyes: Normal exam ENT      Head: Normocephalic and atraumatic.      Mouth/Throat: Mucous membranes are moist. Cardiovascular: Normal rate, regular rhythm.  Respiratory: Normal respiratory effort without tachypnea nor retractions. Breath sounds are clear  Gastrointestinal: Soft and nontender. No distention.   Musculoskeletal: Nontender with normal range of motion in all extremities.  Neurologic:  Normal speech and language. No gross focal neurologic deficits Skin:  Skin is warm, dry and intact.  Psychiatric: Mood and affect are normal. Speech and behavior are normal.   ____________________________________________    EKG  EKG viewed and interpreted by myself shows a sinus rhythm 83 bpm with a narrow QRS, normal axis, normal intervals, no concerning ST changes.  ____________________________________________    RADIOLOGY  CT scan head is negative for acute abnormality  ____________________________________________   INITIAL IMPRESSION / ASSESSMENT AND PLAN / ED COURSE  Pertinent labs & imaging results that were  available during my care of the patient were reviewed by me and considered in my medical decision making (see chart for details).   Patient presents the emergency department for headache and hypertension.  Reports hypertension at home.  States mild headache currently.  No weakness numbness confusion or slurred speech at any point.  Patient denies any long history of headaches.  We will obtain a CT scan head as a precaution.  Remainder of the work-up is largely nonrevealing including lab work, negative troponin and a negative COVID/flu and reassuring EKG.  We will treat the patient's headache with Fioricet.  Blood pressure not dangerously elevated 162/90.  Per record review patient takes a half a tablet/2.5 mg of amlodipine daily.  This could easily be increased at the direction of her PCP.  If the patient CT is negative we will discharge the patient home with PCP follow-up.  Patient agreeable to plan of care.  CT scan negative.  We will discharge with Fioricet.  Patient is feeling better.  We will have her follow-up with her PCP to adjust blood pressure medications.  Jordan Hawks Elberta Calia was evaluated in Emergency Department on 01/29/2021 for the symptoms described in the history of present illness. She was evaluated in the context of the global COVID-19 pandemic, which necessitated consideration that the patient might be at risk for infection with the SARS-CoV-2 virus that causes COVID-19. Institutional protocols and algorithms that pertain to the evaluation of patients at risk for COVID-19 are in a state of rapid change based on information released by regulatory bodies including the CDC and federal and state organizations. These policies and algorithms were followed during the patient's care in the ED.  ____________________________________________   FINAL CLINICAL IMPRESSION(S) / ED DIAGNOSES  Hypertension Headache   Harvest Dark, MD 01/29/21 1902

## 2021-01-29 NOTE — ED Notes (Signed)
Pt ambulated self efficiently to restroom without difficulty. Pt returned safely to bedside.

## 2021-01-29 NOTE — Telephone Encounter (Signed)
Pt called in stating that she is experiencing high blood pressure. Pt stated that she started of with headaches but didn't know why she was having the headaches. Pt stated that she checked her BP on Sunday night 01/24/2021  and on Monday 01/25/2021 the blood pressure was reading 140/144 on Sunday and 150/101 on Monday. Pt stated that she was trying to get the systolic number to go down but the systolic number just keep staying high. Pt stated that she is on two different medication for High BP (amLODipine (NORVASC) 2.5 MG tablet) and (metoprolol succinate (TOPROL-XL) 50 MG 24 hr tablet). Pt stated that she is experiencing chest pain that comes and goes, headaches that are constance, and blurry vision. Transfer to access nurse.

## 2021-02-03 ENCOUNTER — Telehealth: Payer: Self-pay

## 2021-02-03 ENCOUNTER — Ambulatory Visit: Payer: Medicare Other | Admitting: Family

## 2021-02-03 ENCOUNTER — Other Ambulatory Visit: Payer: Self-pay

## 2021-02-03 MED ORDER — BUTALBITAL-APAP-CAFFEINE 50-325-40 MG PO TABS
1.0000 | ORAL_TABLET | Freq: Four times a day (QID) | ORAL | 0 refills | Status: DC | PRN
Start: 2021-02-03 — End: 2021-03-26

## 2021-02-03 MED ORDER — AMLODIPINE BESYLATE 10 MG PO TABS: 10.0000 mg | ORAL_TABLET | Freq: Every day | ORAL | 1 refills | Status: DC

## 2021-02-03 NOTE — Telephone Encounter (Signed)
Patient voiced understanding with repeat tpo increase amlodipine to 10 mg daily and that Fioricet called to pharmacy and keep appt.

## 2021-02-03 NOTE — Telephone Encounter (Signed)
SEE other note left message to call office.

## 2021-02-03 NOTE — Telephone Encounter (Signed)
Pt missed appt with Worthy Rancher today and pt called into the office very distraught and crying stating her BP has been high and she's been having headaches and trouble with her vision. Pt went to the ED 11/18 and was prescribed Fioricet for her headaches. Pt does not want to return to the ER again and have to pay to be seen and not have anything done for her. Pt states she wants to be seen by a Doctor and be helped. Pt was rescheduled to 12/2 with provider. Pt states most recent BP was 178/107. Please advise.

## 2021-02-03 NOTE — Telephone Encounter (Signed)
Patient has been taking Amlodipine 5 mg since Friday per patient she say snow she has headache and BP 201/107 pulse she cannot recall. with headache and dizziness. Had patient sit and retake BP while on phone, 170/95 and pulse 69 BP cuff talks so nurse could here readings. Patient was prescribed Fioricet at ED last Friday 20 tablets asking will PCP refill and advise as to the amlodipine she has been taking 5 mg since Friday.

## 2021-02-03 NOTE — Telephone Encounter (Signed)
The patient was scheduled for an appointment with the NP. However, she was late for her appointment and could not be seen by the provider. The patient stated that she needed to see someone regarding her Blood pressure. I informed the patient that I would schedule her with a provider , but I didn't have any available appointments. The patient stated did she have to go back to the ED. I informed her she could go to urgent care and I would send her provider a message. She through the clip board through the window and stormed out of the clinic.

## 2021-02-03 NOTE — Telephone Encounter (Signed)
Left message for patient to call office.  

## 2021-02-12 ENCOUNTER — Ambulatory Visit (INDEPENDENT_AMBULATORY_CARE_PROVIDER_SITE_OTHER): Payer: Medicare Other | Admitting: Internal Medicine

## 2021-02-12 ENCOUNTER — Encounter: Payer: Self-pay | Admitting: Internal Medicine

## 2021-02-12 ENCOUNTER — Other Ambulatory Visit: Payer: Self-pay

## 2021-02-12 VITALS — BP 136/80 | HR 66 | Temp 97.8°F | Ht 65.0 in | Wt 169.8 lb

## 2021-02-12 DIAGNOSIS — F5105 Insomnia due to other mental disorder: Secondary | ICD-10-CM

## 2021-02-12 DIAGNOSIS — F409 Phobic anxiety disorder, unspecified: Secondary | ICD-10-CM | POA: Diagnosis not present

## 2021-02-12 DIAGNOSIS — R519 Headache, unspecified: Secondary | ICD-10-CM | POA: Diagnosis not present

## 2021-02-12 DIAGNOSIS — I10 Essential (primary) hypertension: Secondary | ICD-10-CM | POA: Diagnosis not present

## 2021-02-12 NOTE — Patient Instructions (Addendum)
Talk to Dr Janeece Riggers about changing your  zolpidem to zolpidem  C R    Change melatonin  to dinner time dosing   Check back with your eye doctor about your eye pressure   Continue fioricet as needed.  Not more than every other day    Go back on iron  supplement once daily with food

## 2021-02-12 NOTE — Progress Notes (Signed)
Subjective:  Patient ID: Sherri Lee, female    DOB: 1955/05/30  Age: 65 y.o. MRN: [id number]  CC: The primary encounter diagnosis was Headache above the eye region. Diagnoses of Essential hypertension, Insomnia due to anxiety and fear, and Recurrent headache were also pertinent to this visit.  HPI Sherri Lee presents for ER Follow up  This visit occurred during the SARS-CoV-2 public health emergency.  Safety protocols were in place, including screening questions prior to the visit, additional usage of staff PPE, and extensive cleaning of exam room while observing appropriate contact time as indicated for disinfecting solutions.    Treated in ER on Nov 18 for persistent daily headaches /dizziness that had been occurring for one week ,  accompanied by BP 676 systolic .   Last previous normal BP unknown,  had not been checking for months.    Dizziness is intermittent but usually accompanied by headaches.  Getting one not daily .using fioricet 2 at a time,  not more than 3 times per week. Always occur on the right side . Diagnosed with glaucoma by eye doctor recently  after years of "glaucoma suspect"  , apparently on both sides  started using eye drops   in spring   Increased emotional stressors .   Not sleeping more than 4 hours despite  using ambien   HTN:  bp improved on higher dose of amlodipine 10 mg daily started on Nov 24    Outpatient Medications Prior to Visit  Medication Sig Dispense Refill   acyclovir (ZOVIRAX) 400 MG tablet Take 1 tablet (400 mg total) by mouth 5 (five) times daily. 35 tablet 3   albuterol (VENTOLIN HFA) 108 (90 Base) MCG/ACT inhaler TAKE 2 PUFFS BY MOUTH EVERY 6 HOURS AS NEEDED FOR WHEEZE OR SHORTNESS OF BREATH 8.5 each 2   amLODipine (NORVASC) 10 MG tablet Take 1 tablet (10 mg total) by mouth daily. 90 tablet 1   amLODipine (NORVASC) 5 MG tablet Take 5 mg by mouth daily.     atorvastatin (LIPITOR) 20 MG tablet Take 1 tablet (20  mg total) by mouth daily. 90 tablet 3   butalbital-acetaminophen-caffeine (FIORICET) 50-325-40 MG tablet Take 1-2 tablets by mouth every 6 (six) hours as needed for headache. 120 tablet 0   calcium carbonate (OS-CAL) 600 MG TABS Take 600 mg by mouth 2 (two) times daily with a meal.     conjugated estrogens (PREMARIN) vaginal cream Place 1 Applicatorful vaginally every 30 (thirty) days. 2-3 x per month     diclofenac Sodium (VOLTAREN) 1 % GEL Apply 2 g topically 4 (four) times daily. 100 g G   escitalopram (LEXAPRO) 20 MG tablet Take 20 mg by mouth daily.      fluticasone (FLONASE) 50 MCG/ACT nasal spray PLACE 2 SPRAYS INTO BOTH NOSTRILS DAILY AS NEEDED FOR ALLERGIES OR RHINITIS. AFTER NASAL SALINE 48 mL 0   gabapentin (NEURONTIN) 100 MG capsule Take 1 capsule (100 mg total) by mouth 3 (three) times daily. 270 capsule 3   latanoprost (XALATAN) 0.005 % ophthalmic solution 1 drop at bedtime.     LORazepam (ATIVAN) 1 MG tablet TAKE 1 TABLET BY MOUTH TWICE A DAY AS DIRECTED FOR ANXIETY/PANIC  2   metoprolol succinate (TOPROL-XL) 50 MG 24 hr tablet Take 0.5 tablets (25 mg total) by mouth daily. Take with or immediately following a meal. 90 tablet 1   Multiple Vitamin (MULTIVITAMIN) tablet Take 1 tablet by mouth daily. Bariatric Vitamin     pantoprazole (  PROTONIX) 40 MG tablet TAKE 1 TABLET BY MOUTH EVERY DAY 90 tablet 3   potassium chloride SA (KLOR-CON) 20 MEQ tablet Take 1 tablet (20 mEq total) by mouth daily. 5 tablet 0   traMADol (ULTRAM) 50 MG tablet TAKE 1 TABLET (50 MG TOTAL) BY MOUTH 2 (TWO) TIMES DAILY AS NEEDED FOR MODERATE PAIN. 60 tablet 0   VITAMIN D PO Take by mouth.     zolpidem (AMBIEN) 10 MG tablet Take 10 mg by mouth at bedtime as needed for sleep.     No facility-administered medications prior to visit.    Review of Systems;  Patient denies headache, fevers, malaise, unintentional weight loss, skin rash, eye pain, sinus congestion and sinus pain, sore throat, dysphagia,   hemoptysis , cough, dyspnea, wheezing, chest pain, palpitations, orthopnea, edema, abdominal pain, nausea, melena, diarrhea, constipation, flank pain, dysuria, hematuria, urinary  Frequency, nocturia, numbness, tingling, seizures,  Focal weakness, Loss of consciousness,  Tremor, insomnia, depression, anxiety, and suicidal ideation.      Objective:  BP 136/80   Pulse 66   Temp 97.8 F (36.6 C) (Temporal)   Ht 5' 5"  (1.651 m)   Wt 169 lb 12.8 oz (77 kg)   SpO2 99%   BMI 28.26 kg/m   BP Readings from Last 3 Encounters:  02/12/21 136/80  01/29/21 (!) 178/107  11/10/20 126/84    Wt Readings from Last 3 Encounters:  02/12/21 169 lb 12.8 oz (77 kg)  01/29/21 167 lb (75.8 kg)  11/10/20 171 lb 9.6 oz (77.8 kg)    General appearance: alert, cooperative and appears stated age Ears: normal TM's and external ear canals both ears Throat: lips, mucosa, and tongue normal; teeth and gums normal Neck: no adenopathy, no carotid bruit, supple, symmetrical, trachea midline and thyroid not enlarged, symmetric, no tenderness/mass/nodules Back: symmetric, no curvature. ROM normal. No CVA tenderness. Lungs: clear to auscultation bilaterally Heart: regular rate and rhythm, S1, S2 normal, no murmur, click, rub or gallop Abdomen: soft, non-tender; bowel sounds normal; no masses,  no organomegaly Pulses: 2+ and symmetric Skin: Skin color, texture, turgor normal. No rashes or lesions Lymph nodes: Cervical, supraclavicular, and axillary nodes normal.  Lab Results  Component Value Date   HGBA1C 6.4 09/01/2020   HGBA1C 5.9 05/28/2020   HGBA1C 5.7 07/29/2019    Lab Results  Component Value Date   CREATININE 0.65 01/29/2021   CREATININE 0.74 10/07/2020   CREATININE 0.61 09/01/2020    Lab Results  Component Value Date   WBC 5.1 01/29/2021   HGB 11.9 (L) 01/29/2021   HCT 37.4 01/29/2021   PLT 233 01/29/2021   GLUCOSE 143 (H) 01/29/2021   CHOL 174 09/01/2020   TRIG 55.0 09/01/2020   HDL 84.90  09/01/2020   LDLDIRECT 99.0 09/04/2015   LDLCALC 79 09/01/2020   ALT 18 09/01/2020   AST 16 09/01/2020   NA 140 01/29/2021   K 3.5 01/29/2021   CL 106 01/29/2021   CREATININE 0.65 01/29/2021   BUN 7 (L) 01/29/2021   CO2 26 01/29/2021   TSH 0.59 07/29/2019   HGBA1C 6.4 09/01/2020   MICROALBUR 0.6 09/04/2015    CT HEAD WO CONTRAST (5MM)  Result Date: 01/29/2021 CLINICAL DATA:  Headache, new or worsening (Age >= 50y) EXAM: CT HEAD WITHOUT CONTRAST TECHNIQUE: Contiguous axial images were obtained from the base of the skull through the vertex without intravenous contrast. COMPARISON:  CT head 10/21/2018 FINDINGS: Brain: No evidence of acute infarction, hemorrhage, hydrocephalus, extra-axial collection or mass  lesion/mass effect. Vascular: No hyperdense vessel identified. Intracranial atherosclerosis. Skull: No acute fracture. Sinuses/Orbits: Clear sinuses.  Unremarkable orbits. Other: No mastoid effusions. IMPRESSION: No evidence of acute intracranial abnormality. Electronically Signed   By: Margaretha Sheffield M.D.   On: 01/29/2021 17:52    Assessment & Plan:   Problem List Items Addressed This Visit     Essential hypertension    Recent loss of control noted.  Improved with higher dose of amlodipine       Relevant Medications   amLODipine (NORVASC) 5 MG tablet   Insomnia due to anxiety and fear    Managed by psychiatry since her overdose in 2019.  Advised to request ambien CR given early wakeups      Recurrent headache    Occurring 3 times per weeks,  Affecting scaop of right temple .  Temporal ertieritis considered bu unlikely given normal ESR.  Continue tylenol and fioricet limited to 3 times per week   Lab Results  Component Value Date   ESRSEDRATE 14 02/12/2021         Relevant Medications   amLODipine (NORVASC) 5 MG tablet   Other Visit Diagnoses     Headache above the eye region    -  Primary   Relevant Medications   amLODipine (NORVASC) 5 MG tablet   Other  Relevant Orders   Sedimentation rate (Completed)       I am having Sherri Lee maintain her calcium carbonate, multivitamin, escitalopram, zolpidem, LORazepam, acyclovir, conjugated estrogens, atorvastatin, gabapentin, potassium chloride SA, albuterol, VITAMIN D PO, pantoprazole, latanoprost, metoprolol succinate, diclofenac Sodium, fluticasone, traMADol, butalbital-acetaminophen-caffeine, amLODipine, and amLODipine.  No orders of the defined types were placed in this encounter.   There are no discontinued medications.  Follow-up: Return in about 3 months (around 05/13/2021).   Crecencio Mc, MD

## 2021-02-13 DIAGNOSIS — R519 Headache, unspecified: Secondary | ICD-10-CM | POA: Insufficient documentation

## 2021-02-13 LAB — SEDIMENTATION RATE: Sed Rate: 14 mm/h (ref 0–30)

## 2021-02-13 NOTE — Assessment & Plan Note (Signed)
Occurring 3 times per weeks,  Affecting scaop of right temple .  Temporal ertieritis considered bu unlikely given normal ESR.  Continue tylenol and fioricet limited to 3 times per week   Lab Results  Component Value Date   ESRSEDRATE 14 02/12/2021

## 2021-02-13 NOTE — Assessment & Plan Note (Signed)
Managed by psychiatry since her overdose in 2019.  Advised to request ambien CR given early wakeups

## 2021-02-13 NOTE — Assessment & Plan Note (Signed)
Recent loss of control noted.  Improved with higher dose of amlodipine

## 2021-02-15 ENCOUNTER — Other Ambulatory Visit: Payer: Self-pay | Admitting: Internal Medicine

## 2021-02-15 DIAGNOSIS — R112 Nausea with vomiting, unspecified: Secondary | ICD-10-CM

## 2021-03-05 ENCOUNTER — Telehealth: Payer: Self-pay | Admitting: Internal Medicine

## 2021-03-05 ENCOUNTER — Other Ambulatory Visit: Payer: Self-pay | Admitting: Internal Medicine

## 2021-03-05 MED ORDER — METOPROLOL SUCCINATE ER 50 MG PO TB24: 50.0000 mg | ORAL_TABLET | Freq: Every day | ORAL | 2 refills | Status: DC

## 2021-03-05 NOTE — Telephone Encounter (Signed)
Should this be refilled for pt to continue taking at a whole tablet daily or a half tablet daily? Pt is currently out of medication because she was unaware that the dose was decreased to 1/2 tablet.

## 2021-03-05 NOTE — Telephone Encounter (Signed)
Pt called in stating that she ran out of medication (metoprolol succinate (TOPROL-XL) 50 MG 24 hr tablet). Pt stated that she didn't know that she had to take 1/2 of medication this whole time. Pt stated ever since she started the medication she been taking 1 tablet a day. Pt stated when they had change her other medication she didn't know that they change the amount on this medication as well. Pt stated she though that she was still taking 1 tablet per day. Pt didn't know there was a change in dosage until she try to refill the medication at pharmacy and the pharmacist advise Pt that the medication dosage has change. Pt stated that the pharmacist told her that she should have read the bottle before taking the medication. Pt stated she ran out of medication (metoprolol succinate (TOPROL-XL) 50 MG 24 hr tablet) and was wondering if a script can be sent to pharmacy for the until she can get the medication refilled. Pt requesting callback at [phone] cell or [phone] home.

## 2021-03-09 NOTE — Telephone Encounter (Signed)
Spoke with pt and she stated that she has been taking the 25 mg of Metoprolol. Pt stated that she does not check her bp often but the last time she checked it it was 152/70s. Pt was advised to continue the dose she is taking and scheduled the pt for a nurse visit to have bp checked this week.

## 2021-03-10 ENCOUNTER — Encounter: Payer: Self-pay | Admitting: Internal Medicine

## 2021-03-11 ENCOUNTER — Ambulatory Visit (INDEPENDENT_AMBULATORY_CARE_PROVIDER_SITE_OTHER): Payer: Medicare Other

## 2021-03-11 ENCOUNTER — Other Ambulatory Visit: Payer: Self-pay

## 2021-03-11 VITALS — BP 110/72

## 2021-03-11 DIAGNOSIS — I10 Essential (primary) hypertension: Secondary | ICD-10-CM

## 2021-03-11 NOTE — Progress Notes (Signed)
She can continue the metoprolol as taking as long as no dizziness, lightheadedness or symptoms of hypotension.   Was heart rate okay  Agree needs follow up with PCP for Lexapro, as long as not any suicidal or homicidal ideations or intents ok with 1/3 appointment   Follow treatment plan from office  if not improving or any worsening within 72 hours and also return to office or open medical facility at ANYTIME if any symptoms persist, change, or worsen or you have any further concerns or questions. Call 911 immediately for emergencies.

## 2021-03-11 NOTE — Progress Notes (Signed)
Patient came in for BP check today and to clarify dose of metoprolol. Her BP is 110/72 and patient confirmed she is taking 1/2 (25 mg) of her metoprolol. Patient also noted that she wanted to discuss changing her lexapro while here for BP check. Discussed with Shanda Bumps and scheduled patient for 1/3.

## 2021-03-16 ENCOUNTER — Encounter: Payer: Self-pay | Admitting: Internal Medicine

## 2021-03-16 ENCOUNTER — Telehealth (INDEPENDENT_AMBULATORY_CARE_PROVIDER_SITE_OTHER): Payer: Medicare Other | Admitting: Internal Medicine

## 2021-03-16 DIAGNOSIS — Z87898 Personal history of other specified conditions: Secondary | ICD-10-CM | POA: Diagnosis not present

## 2021-03-16 DIAGNOSIS — D509 Iron deficiency anemia, unspecified: Secondary | ICD-10-CM

## 2021-03-16 DIAGNOSIS — F4325 Adjustment disorder with mixed disturbance of emotions and conduct: Secondary | ICD-10-CM | POA: Diagnosis not present

## 2021-03-16 DIAGNOSIS — I10 Essential (primary) hypertension: Secondary | ICD-10-CM

## 2021-03-16 NOTE — Assessment & Plan Note (Signed)
Advised to increase metoprolol dose to 50 mg daily to manage tachycardia and reduce amlodipine if needed,  To 5 mg

## 2021-03-16 NOTE — Progress Notes (Signed)
Virtual Visit via Caregility  Note  This visit type was conducted due to national recommendations for restrictions regarding the COVID-19 pandemic (e.g. social distancing).  This format is felt to be most appropriate for this patient at this time.  All issues noted in this document were discussed and addressed.  No physical exam was performed (except for noted visual exam findings with Video Visits).   I connected withNAME@ on 03/16/21 at  3:30 PM EST by a video enabled telemedicine application  and verified that I am speaking with the correct person using two identifiers. Location patient: home Location provider: work or home office Persons participating in the virtual visit: patient, provider  I discussed the limitations, risks, security and privacy concerns of performing an evaluation and management service by telephone and the availability of in person appointments. I also discussed with the patient that there may be a patient responsible charge related to this service. The patient expressed understanding and agreed to proceed.   Reason for visit: follow up   HPI:  66 yr old female with history of depression and GAD managed by psychiatry, hypertension, recurrent headaches ,  and chronic anemia secondary to gastric bypass surgery, presents with request for change in antidepressant   1) caregiver stress.  Both parents are alive and in their late 65's divorced from each other and both requiring increased assistance. .  Father has just married his 3rd wife and is now in dispute with newly acquired stepchildren requiring legal preceedings.  Her 3.5 year relationship broke up just before the holidays.  Het mother requires increased supervision and assistance.  She finds herself crying a lot daily and feeling overwhelemd,  but is not suicidal,   She has been taking lexapro 20 mg daily ,for a long time wonders if she is "immune" to it.  Along with lorazepam 2 times daily and ambien qhs  ,  all  prescribed by Dr Janeece Riggers her psychiatrist.  Has an appt with him on Jan 18.     2) HTN improving with higher dose of amlodipine but pulse has increased to 107  taking 25 mg metoprolol   3) headaches improving,  less frequent.  Using fioricet prn     ROS: See pertinent positives and negatives per HPI.  Past Medical History:  Diagnosis Date   Anemia    Low iron and hemoglobin   Anginal pain (HCC)    states she thinks it is stress   Anxiety    Asthma    COVID-19    07/05/20   Depression    Dysrhythmia    states she has PVC's   Fibromyalgia    Hypertension    Recurrent vomiting 02/04/2018   Schatzki's ring    dialated 3 times by Markham Jordan     Past Surgical History:  Procedure Laterality Date   ABDOMINAL ADHESION SURGERY     BUNIONECTOMY     left foot    CERVICAL FUSION     CESAREAN SECTION     CHOLECYSTECTOMY  June 2013   GASTRIC BYPASS      Family History  Problem Relation Age of Onset   Hypertension Mother    Glaucoma Mother    Heart disease Father    Stroke Father    Breast cancer Maternal Aunt    Cancer Maternal Aunt    Colon cancer Neg Hx    Ovarian cancer Neg Hx     SOCIAL HX:  reports that she has never smoked. She has never  used smokeless tobacco. She reports current alcohol use of about 3.0 standard drinks per week. She reports that she does not use drugs.    Current Outpatient Medications:    acyclovir (ZOVIRAX) 400 MG tablet, Take 1 tablet (400 mg total) by mouth 5 (five) times daily., Disp: 35 tablet, Rfl: 3   albuterol (VENTOLIN HFA) 108 (90 Base) MCG/ACT inhaler, TAKE 2 PUFFS BY MOUTH EVERY 6 HOURS AS NEEDED FOR WHEEZE OR SHORTNESS OF BREATH, Disp: 8.5 each, Rfl: 2   amLODipine (NORVASC) 10 MG tablet, Take 1 tablet (10 mg total) by mouth daily., Disp: 90 tablet, Rfl: 1   atorvastatin (LIPITOR) 20 MG tablet, Take 1 tablet (20 mg total) by mouth daily., Disp: 90 tablet, Rfl: 3   butalbital-acetaminophen-caffeine (FIORICET) 50-325-40 MG tablet, Take 1-2  tablets by mouth every 6 (six) hours as needed for headache., Disp: 120 tablet, Rfl: 0   calcium carbonate (OS-CAL) 600 MG TABS, Take 600 mg by mouth 2 (two) times daily with a meal., Disp: , Rfl:    diclofenac Sodium (VOLTAREN) 1 % GEL, Apply 2 g topically 4 (four) times daily., Disp: 100 g, Rfl: G   escitalopram (LEXAPRO) 20 MG tablet, Take 20 mg by mouth daily. , Disp: , Rfl:    fluticasone (FLONASE) 50 MCG/ACT nasal spray, PLACE 2 SPRAYS INTO BOTH NOSTRILS DAILY AS NEEDED FOR ALLERGIES OR RHINITIS. AFTER NASAL SALINE, Disp: 48 mL, Rfl: 0   gabapentin (NEURONTIN) 100 MG capsule, Take 1 capsule (100 mg total) by mouth 3 (three) times daily., Disp: 270 capsule, Rfl: 3   latanoprost (XALATAN) 0.005 % ophthalmic solution, 1 drop at bedtime., Disp: , Rfl:    LORazepam (ATIVAN) 1 MG tablet, TAKE 1 TABLET BY MOUTH TWICE A DAY AS DIRECTED FOR ANXIETY/PANIC, Disp: , Rfl: 2   metoprolol succinate (TOPROL-XL) 50 MG 24 hr tablet, Take 1 tablet (50 mg total) by mouth daily. Take with or immediately following a meal., Disp: 30 tablet, Rfl: 2   Multiple Vitamin (MULTIVITAMIN) tablet, Take 1 tablet by mouth daily. Bariatric Vitamin, Disp: , Rfl:    pantoprazole (PROTONIX) 40 MG tablet, TAKE 1 TABLET BY MOUTH EVERY DAY, Disp: 90 tablet, Rfl: 3   potassium chloride SA (KLOR-CON) 20 MEQ tablet, Take 1 tablet (20 mEq total) by mouth daily., Disp: 5 tablet, Rfl: 0   VITAMIN D PO, Take by mouth., Disp: , Rfl:    zolpidem (AMBIEN) 10 MG tablet, Take 10 mg by mouth at bedtime as needed for sleep., Disp: , Rfl:    conjugated estrogens (PREMARIN) vaginal cream, Place 1 Applicatorful vaginally every 30 (thirty) days. 2-3 x per month (Patient not taking: Reported on 03/16/2021), Disp: , Rfl:    traMADol (ULTRAM) 50 MG tablet, TAKE 1 TABLET (50 MG TOTAL) BY MOUTH 2 (TWO) TIMES DAILY AS NEEDED FOR MODERATE PAIN. (Patient not taking: Reported on 03/16/2021), Disp: 60 tablet, Rfl: 0  EXAM:  VITALS per patient if  applicable:  GENERAL: alert, oriented, appears well and in no acute distress  HEENT: atraumatic, conjunttiva clear, no obvious abnormalities on inspection of external nose and ears  NECK: normal movements of the head and neck  LUNGS: on inspection no signs of respiratory distress, breathing rate appears normal, no obvious gross SOB, gasping or wheezing  CV: no obvious cyanosis  MS: moves all visible extremities without noticeable abnormality  PSYCH/NEURO: pleasant and cooperative, no obvious depression or anxiety, speech and thought processing grossly intact  ASSESSMENT AND PLAN:  Discussed the following assessment  and plan:  Essential hypertension  Adjustment disorder with mixed disturbance of emotions and conduct  Iron deficiency anemia, unspecified iron deficiency anemia type  History of benzodiazepine use  Essential hypertension Advised to increase metoprolol dose to 50 mg daily to manage tachycardia and reduce amlodipine if needed,  To 5 mg   Adjustment disorder with mixed disturbance of emotions and conduct She endorses increased symptoms of depression secondary to an abruptly ended relationship , and increased responsibilities caring for parents who live independent of each other.  Her affect is not depressed ; however, and she is not suicidal.  Given her ongoing relationship with Dr Janeece Riggers and her maximal dose of lexapro 20 mg daily and concurrent use of lorazepam, I have deferred to Dr Janeece Riggers.   IDA (iron deficiency anemia) hgb had nearly  normalized in November,  And she has not been taking iron supplements in months.  Repeat labs recommended.   Lab Results  Component Value Date   WBC 5.1 01/29/2021   HGB 11.9 (L) 01/29/2021   HCT 37.4 01/29/2021   MCV 88.0 01/29/2021   PLT 233 01/29/2021     History of benzodiazepine use She is now taking benzodiazepines again, prescribed by her psychiatrist.     I discussed the assessment and treatment plan with the patient. The  patient was provided an opportunity to ask questions and all were answered. The patient agreed with the plan and demonstrated an understanding of the instructions.   The patient was advised to call back or seek an in-person evaluation if the symptoms worsen or if the condition fails to improve as anticipated.   I spent 30 minutes dedicated to the care of this patient on the date of this encounter to include pre-visit review of his medical history,  Face-to-face time with the patient , and post visit ordering of testing and therapeutics.    [name], MD

## 2021-03-16 NOTE — Assessment & Plan Note (Signed)
She is now taking benzodiazepines again, prescribed by her psychiatrist.

## 2021-03-16 NOTE — Assessment & Plan Note (Signed)
hgb had nearly  normalized in November,  And she has not been taking iron supplements in months.  Repeat labs recommended.   Lab Results  Component Value Date   WBC 5.1 01/29/2021   HGB 11.9 (L) 01/29/2021   HCT 37.4 01/29/2021   MCV 88.0 01/29/2021   PLT 233 01/29/2021

## 2021-03-16 NOTE — Patient Instructions (Signed)
Increase your metoprolol to  a full tablet daily (50 mg daily)  Reduce the amlodipine  to 5 mg if your BP drops below 120 after the change  in metoprolol dose

## 2021-03-16 NOTE — Assessment & Plan Note (Signed)
She endorses increased symptoms of depression secondary to an abruptly ended relationship , and increased responsibilities caring for parents who live independent of each other.  Her affect is not depressed ; however, and she is not suicidal.  Given her ongoing relationship with Dr Janeece Riggers and her maximal dose of lexapro 20 mg daily and concurrent use of lorazepam, I have deferred to Dr Janeece Riggers.

## 2021-03-18 ENCOUNTER — Other Ambulatory Visit: Payer: Self-pay | Admitting: Internal Medicine

## 2021-03-18 DIAGNOSIS — J329 Chronic sinusitis, unspecified: Secondary | ICD-10-CM

## 2021-03-25 ENCOUNTER — Other Ambulatory Visit: Payer: Self-pay | Admitting: Internal Medicine

## 2021-03-25 NOTE — Telephone Encounter (Signed)
Refilled: 02/03/2021 Last OV: 03/16/2021 Next OV: 05/14/2021

## 2021-03-31 ENCOUNTER — Encounter: Payer: Self-pay | Admitting: Nurse Practitioner

## 2021-03-31 ENCOUNTER — Telehealth: Payer: Medicare Other | Admitting: Nurse Practitioner

## 2021-03-31 ENCOUNTER — Telehealth: Payer: Self-pay | Admitting: Internal Medicine

## 2021-03-31 DIAGNOSIS — J0140 Acute pansinusitis, unspecified: Secondary | ICD-10-CM

## 2021-03-31 DIAGNOSIS — J399 Disease of upper respiratory tract, unspecified: Secondary | ICD-10-CM | POA: Diagnosis not present

## 2021-03-31 DIAGNOSIS — J22 Unspecified acute lower respiratory infection: Secondary | ICD-10-CM

## 2021-03-31 MED ORDER — BINAXNOW COVID-19 AG HOME TEST VI KIT: PACK | 0 refills | Status: DC

## 2021-03-31 MED ORDER — ALBUTEROL SULFATE HFA 108 (90 BASE) MCG/ACT IN AERS: 2.0000 | INHALATION_SPRAY | Freq: Four times a day (QID) | RESPIRATORY_TRACT | 0 refills | Status: DC | PRN

## 2021-03-31 MED ORDER — DOXYCYCLINE HYCLATE 100 MG PO TABS
100.0000 mg | ORAL_TABLET | Freq: Two times a day (BID) | ORAL | 0 refills | Status: AC
Start: 2021-03-31 — End: 2021-04-10

## 2021-03-31 NOTE — Telephone Encounter (Signed)
Pt did have VV today & was prescribed albuterol inhaler & doxy. Also sent in Covid test to take.

## 2021-03-31 NOTE — Progress Notes (Signed)
Virtual Visit Consent   Brantley Persons, you are scheduled for a virtual visit with a [facility] provider today.     Just as with appointments in the office, your consent must be obtained to participate.  Your consent will be active for this visit and any virtual visit you may have with one of our providers in the next 365 days.     If you have a MyChart account, a copy of this consent can be sent to you electronically.  All virtual visits are billed to your insurance company just like a traditional visit in the office.    As this is a virtual visit, video technology does not allow for your provider to perform a traditional examination.  This may limit your provider's ability to fully assess your condition.  If your provider identifies any concerns that need to be evaluated in person or the need to arrange testing (such as labs, EKG, etc.), we will make arrangements to do so.     Although advances in technology are sophisticated, we cannot ensure that it will always work on either your end or our end.  If the connection with a video visit is poor, the visit may have to be switched to a telephone visit.  With either a video or telephone visit, we are not always able to ensure that we have a secure connection.     I need to obtain your verbal consent now.   Are you willing to proceed with your visit today?    Jordan Hawks Violetta Dimon has provided verbal consent on 03/31/2021 for a virtual visit (video or telephone).   Apolonio Schneiders, FNP   Date: 03/31/2021 4:03 PM   Virtual Visit via Video Note   I, Apolonio Schneiders, connected with  Leisel Heyse  ([id number], 12/01/1955) on 03/31/21 at  4:00 PM EST by a video-enabled telemedicine application and verified that I am speaking with the correct person using two identifiers.  Location: Patient: Virtual Visit Location Patient: Home Provider: Virtual Visit Location Provider: Home Office   I discussed the limitations of  evaluation and management by telemedicine and the availability of in person appointments. The patient expressed understanding and agreed to proceed.    History of Present Illness: Sherri Lee is a 66 y.o. who identifies as a female who was assigned female at birth, and is being seen today with complaints of a cough and nasal congestion. She has had headaches as well. Initially she did not have a fever but feels she may have developed one now.   She does not have a COVID test at home so she has not taken one yet.  She has most recently had COVID in May of 2022 She has been vaccinated for COVID with a booster.   She was in a large crowd the day prior to symptom onset.   She has been taking Dayquil and Nyquil for symptom relief.   She does have a history of asthma and has needed an inhaler in the past  She has had weight loss surgery and that did help resolve many of her comorbidities.    She has not needed inhalers in the recent years since her surgery.   Problems:  Patient Active Problem List   Diagnosis Date Noted   Recurrent headache 02/13/2021   Aortic atherosclerosis ([place]) 11/12/2020   Hypokalemia 09/03/2020   Personal history of COVID-19 08/06/2020   RUQ pain 07/03/2020   Encounter for preventive health examination 07/30/2019  Low back pain associated with a spinal disorder other than radiculopathy or spinal stenosis 10/28/2018   Urge incontinence of urine 08/15/2018   Injury of branch of radial nerve 08/15/2018   Skin lesion of right lower extremity 11/04/2017   Adjustment disorder with mixed disturbance of emotions and conduct 03/31/2017   History of benzodiazepine use 03/31/2017   Esophageal stricture 06/19/2016   IBS (irritable bowel syndrome) 09/06/2015   IDA (iron deficiency anemia) 09/06/2015   DDD (degenerative disc disease), cervical 09/24/2014   DDD (degenerative disc disease), thoracic 09/24/2014   Tremor of both hands 09/10/2014   Insomnia due  to anxiety and fear 02/26/2013   Alopecia areata 07/11/2012   Cervicalgia 07/11/2012   Degenerative joint disease of thoracic spine 10/04/2011   Essential hypertension 05/10/2011   Prediabetes 05/10/2011   Status post gastric bypass for obesity 05/10/2011    Allergies:  Allergies  Allergen Reactions   Bacitracin-Polymyxin B Rash   Diclofenac Hives   Eggs Or Egg-Derived Products    Betadine [Povidone Iodine] Rash   Medications:  Current Outpatient Medications:    acyclovir (ZOVIRAX) 400 MG tablet, Take 1 tablet (400 mg total) by mouth 5 (five) times daily., Disp: 35 tablet, Rfl: 3   albuterol (VENTOLIN HFA) 108 (90 Base) MCG/ACT inhaler, TAKE 2 PUFFS BY MOUTH EVERY 6 HOURS AS NEEDED FOR WHEEZE OR SHORTNESS OF BREATH, Disp: 8.5 each, Rfl: 2   amLODipine (NORVASC) 10 MG tablet, Take 1 tablet (10 mg total) by mouth daily., Disp: 90 tablet, Rfl: 1   atorvastatin (LIPITOR) 20 MG tablet, Take 1 tablet (20 mg total) by mouth daily., Disp: 90 tablet, Rfl: 3   butalbital-acetaminophen-caffeine (FIORICET) 50-325-40 MG tablet, TAKE 1-2 TABLETS BY MOUTH EVERY 6 (SIX) HOURS AS NEEDED FOR HEADACHE., Disp: 120 tablet, Rfl: 2   calcium carbonate (OS-CAL) 600 MG TABS, Take 600 mg by mouth 2 (two) times daily with a meal., Disp: , Rfl:    conjugated estrogens (PREMARIN) vaginal cream, Place 1 Applicatorful vaginally every 30 (thirty) days. 2-3 x per month (Patient not taking: Reported on 03/16/2021), Disp: , Rfl:    diclofenac Sodium (VOLTAREN) 1 % GEL, Apply 2 g topically 4 (four) times daily., Disp: 100 g, Rfl: G   escitalopram (LEXAPRO) 20 MG tablet, Take 20 mg by mouth daily. , Disp: , Rfl:    fluticasone (FLONASE) 50 MCG/ACT nasal spray, PLACE 2 SPRAYS INTO BOTH NOSTRILS DAILY AS NEEDED FOR ALLERGIES OR RHINITIS. AFTER NASAL SALINE, Disp: 48 mL, Rfl: 0   gabapentin (NEURONTIN) 100 MG capsule, Take 1 capsule (100 mg total) by mouth 3 (three) times daily., Disp: 270 capsule, Rfl: 3   latanoprost (XALATAN)  0.005 % ophthalmic solution, 1 drop at bedtime., Disp: , Rfl:    LORazepam (ATIVAN) 1 MG tablet, TAKE 1 TABLET BY MOUTH TWICE A DAY AS DIRECTED FOR ANXIETY/PANIC, Disp: , Rfl: 2   metoprolol succinate (TOPROL-XL) 50 MG 24 hr tablet, Take 1 tablet (50 mg total) by mouth daily. Take with or immediately following a meal., Disp: 30 tablet, Rfl: 2   Multiple Vitamin (MULTIVITAMIN) tablet, Take 1 tablet by mouth daily. Bariatric Vitamin, Disp: , Rfl:    pantoprazole (PROTONIX) 40 MG tablet, TAKE 1 TABLET BY MOUTH EVERY DAY, Disp: 90 tablet, Rfl: 3   potassium chloride SA (KLOR-CON) 20 MEQ tablet, Take 1 tablet (20 mEq total) by mouth daily., Disp: 5 tablet, Rfl: 0   traMADol (ULTRAM) 50 MG tablet, TAKE 1 TABLET (50 MG TOTAL) BY MOUTH 2 (  TWO) TIMES DAILY AS NEEDED FOR MODERATE PAIN. (Patient not taking: Reported on 03/16/2021), Disp: 60 tablet, Rfl: 0   VITAMIN D PO, Take by mouth., Disp: , Rfl:    zolpidem (AMBIEN) 10 MG tablet, Take 10 mg by mouth at bedtime as needed for sleep., Disp: , Rfl:   Observations/Objective: Patient is well-developed, well-nourished in no acute distress.  Resting comfortably at home.  Head is normocephalic, atraumatic.  No labored breathing.  Speech is clear and coherent with logical content.  Patient is alert and oriented at baseline.    Assessment and Plan: 1. Lower resp. tract infection Start using rescue inhaler (albuterol ) every 4-6 hours for relief.   2. Upper respiratory disease Mucinex OTC for relief   3. Acute non-recurrent pansinusitis Meds ordered this encounter  Medications   doxycycline (VIBRA-TABS) 100 MG tablet    Sig: Take 1 tablet (100 mg total) by mouth 2 (two) times daily for 10 days.    Dispense:  20 tablet    Refill:  0       Follow Up Instructions: I discussed the assessment and treatment plan with the patient. The patient was provided an opportunity to ask questions and all were answered. The patient agreed with the plan and demonstrated  an understanding of the instructions.  A copy of instructions were sent to the patient via MyChart unless otherwise noted below.    The patient was advised to call back or seek an in-person evaluation if the symptoms worsen or if the condition fails to improve as anticipated.  Time:  I spent 15 minutes with the patient via telehealth technology discussing the above problems/concerns.    Apolonio Schneiders, FNP

## 2021-03-31 NOTE — Telephone Encounter (Signed)
Patient called in regards to possible Covid, Due to No Virtual Visits for today , Patient request  M[facility] info to schedule virtual . I advised patient to speak to Access nurse patient decline

## 2021-04-01 ENCOUNTER — Other Ambulatory Visit: Payer: Self-pay | Admitting: Nurse Practitioner

## 2021-04-01 DIAGNOSIS — U071 COVID-19: Secondary | ICD-10-CM

## 2021-04-01 MED ORDER — MOLNUPIRAVIR EUA 200MG CAPSULE: 4.0000 | ORAL_CAPSULE | Freq: Two times a day (BID) | ORAL | 0 refills | Status: AC

## 2021-04-01 NOTE — Progress Notes (Addendum)
Patient tested positive for COVID-19 after telehealth visit last night. Will send anti-viral as discussed.    Spoke with patient on the phone about medications.  Continue antibiotics and antiviral- take both with food.  Use Albuterol inhaler   Follow up as needed if symptoms persist or with new concerns.   You are being prescribed MOLNUPIRAVIR for COVID-19 infection.      Please call the pharmacy or go through the drive through vs going inside if you are picking up the mediation yourself to prevent further spread. If prescribed to a [facility] affiliated pharmacy, a pharmacist will bring the medication out to your car.   ADMINISTRATION INSTRUCTIONS: Take with or without food. Swallow the tablets whole. Don't chew, crush, or break the medications because it might not work as well  For each dose of the medication, you should be taking FOUR tablets at one time, TWICE a day   Finish your full five-day course of Molnupiravir even if you feel better before you're done. Stopping this medication too early can make it less effective to prevent severe illness related to COVID19.    Molnupiravir is prescribed for YOU ONLY. Don't share it with others, even if they have similar symptoms as you. This medication might not be right for everyone.   Make sure to take steps to protect yourself and others while you're taking this medication in order to get well soon and to prevent others from getting sick with COVID-19.   **If you are of childbearing potential (any gender) - it is advised to not get pregnant while taking this medication and recommended that condoms are used for female partners the next 3 months after taking the medication out of extreme caution    COMMON SIDE EFFECTS: Diarrhea Nausea  Dizziness    If your COVID-19 symptoms get worse, get medical help right away. Call 911 if you experience symptoms such as worsening cough, trouble breathing, chest pain that doesn't go away,  confusion, a hard time staying awake, and pale or blue-colored skin. This medication won't prevent all COVID-19 cases from getting worse.

## 2021-04-02 ENCOUNTER — Telehealth: Payer: Self-pay

## 2021-04-02 NOTE — Telephone Encounter (Signed)
Sherri Lee did virtual not [place] she needs to call there or how do you get in touch with them?  Otherwise needs appt here virtual?    If needing prescription strength medication we will need to make an appointment with a provider.  These are over the counter medication options:  Mucinex dm green label for cough or robitussin DM  Multivitamin or below vitamins  Vitamin C 1000 mg daily.  Vitamin D3 4000 Iu (units) daily.  Zinc 100 mg daily.  Quercetin 250-500 mg 2 times per day   Elderberry  Oil of oregano  cepacol or chloroseptic spray Warm salt water gargles +hydrogen peroxide Sugar free cough drops  Warm tea with honey and lemon  Hydration  Try to eat though you dont feel like it   Tylenol or Advil  Nasal saline and Flonase 2 sprays nasal congestion  If sneezing/runny nose over the counter allergy pill claritin,allegra, zyrtec, xyzal Quarantine x 10-14 days 14 days preferred   Monitor pulse oximeter, buy from [place] if oxygen is less than 90 please go to the hospital.        Are you feeling really sick? Shortness of breath, cough, chest pain?, dizziness? Confusion   If so let me know  If worsening, go to hospital or [facility] clinic Urgent care for further treatment.

## 2021-04-02 NOTE — Telephone Encounter (Signed)
Noted  +covid + on antivirals from virtual visit outside  Will need f/u with PCP

## 2021-04-02 NOTE — Telephone Encounter (Signed)
Pt called yesterday and stated that she was told during her virtual to call back if she tested positive for covid and pt has tested positive for covid.

## 2021-04-02 NOTE — Telephone Encounter (Signed)
LMTCB

## 2021-04-02 NOTE — Telephone Encounter (Signed)
Spoke with pt and she stated that she was able to get in touch with the provider she had a virtual with and she did prescribe her the antiviral. Pt stated that since she started the anti viral she has started to feel better and today has been her best day.

## 2021-04-07 ENCOUNTER — Ambulatory Visit (INDEPENDENT_AMBULATORY_CARE_PROVIDER_SITE_OTHER): Payer: Medicare Other

## 2021-04-07 VITALS — Ht 65.0 in | Wt 169.0 lb

## 2021-04-07 DIAGNOSIS — Z0000 Encounter for general adult medical examination without abnormal findings: Secondary | ICD-10-CM | POA: Diagnosis not present

## 2021-04-07 NOTE — Patient Instructions (Addendum)
Sherri Lee , Thank you for taking time to come for your Medicare Wellness Visit. I appreciate your ongoing commitment to your health goals. Please review the following plan we discussed and let me know if I can assist you in the future.   These are the goals we discussed:  Goals      Weight (lb) < 166 lb (75.3 kg)     I would like to lose about 10lb Increase activity         This is a list of the screening recommended for you and due dates:  Health Maintenance  Topic Date Due   Mammogram  02/25/2017   DEXA scan (bone density measurement)  Never done   Zoster (Shingles) Vaccine (1 of 2) 07/06/2021*   Tetanus Vaccine  07/13/2022   Colon Cancer Screening  07/30/2029   Pneumonia Vaccine  Completed   Flu Shot  Completed   COVID-19 Vaccine  Completed   Hepatitis C Screening: USPSTF Recommendation to screen - Ages 18-79 yo.  Completed   HPV Vaccine  Aged Out  *Topic was postponed. The date shown is not the original due date.    Advanced directives: not yet completed  Conditions/risks identified: none new  Follow up in one year for your annual wellness visit    Preventive Care 65 Years and Older, Female Preventive care refers to lifestyle choices and visits with your health care provider that can promote health and wellness. What does preventive care include? A yearly physical exam. This is also called an annual well check. Dental exams once or twice a year. Routine eye exams. Ask your health care provider how often you should have your eyes checked. Personal lifestyle choices, including: Daily care of your teeth and gums. Regular physical activity. Eating a healthy diet. Avoiding tobacco and drug use. Limiting alcohol use. Practicing safe sex. Taking low-dose aspirin every day. Taking vitamin and mineral supplements as recommended by your health care provider. What happens during an annual well check? The services and screenings done by your health care provider during  your annual well check will depend on your age, overall health, lifestyle risk factors, and family history of disease. Counseling  Your health care provider may ask you questions about your: Alcohol use. Tobacco use. Drug use. Emotional well-being. Home and relationship well-being. Sexual activity. Eating habits. History of falls. Memory and ability to understand (cognition). Work and work Statistician. Reproductive health. Screening  You may have the following tests or measurements: Height, weight, and BMI. Blood pressure. Lipid and cholesterol levels. These may be checked every 5 years, or more frequently if you are over 45 years old. Skin check. Lung cancer screening. You may have this screening every year starting at age 23 if you have a 30-pack-year history of smoking and currently smoke or have quit within the past 15 years. Fecal occult blood test (FOBT) of the stool. You may have this test every year starting at age 43. Flexible sigmoidoscopy or colonoscopy. You may have a sigmoidoscopy every 5 years or a colonoscopy every 10 years starting at age 46. Hepatitis C blood test. Hepatitis B blood test. Sexually transmitted disease (STD) testing. Diabetes screening. This is done by checking your blood sugar (glucose) after you have not eaten for a while (fasting). You may have this done every 1-3 years. Bone density scan. This is done to screen for osteoporosis. You may have this done starting at age 55. Mammogram. This may be done every 1-2 years. Talk to your  health care provider about how often you should have regular mammograms. Talk with your health care provider about your test results, treatment options, and if necessary, the need for more tests. Vaccines  Your health care provider may recommend certain vaccines, such as: Influenza vaccine. This is recommended every year. Tetanus, diphtheria, and acellular pertussis (Tdap, Td) vaccine. You may need a Td booster every 10  years. Zoster vaccine. You may need this after age 10. Pneumococcal 13-valent conjugate (PCV13) vaccine. One dose is recommended after age 105. Pneumococcal polysaccharide (PPSV23) vaccine. One dose is recommended after age 76. Talk to your health care provider about which screenings and vaccines you need and how often you need them. This information is not intended to replace advice given to you by your health care provider. Make sure you discuss any questions you have with your health care provider. Document Released: 03/27/2015 Document Revised: 11/18/2015 Document Reviewed: 12/30/2014 Elsevier Interactive Patient Education  2017 [employer] Prevention in the Home Falls can cause injuries. They can happen to people of all ages. There are many things you can do to make your home safe and to help prevent falls. What can I do on the outside of my home? Regularly fix the edges of walkways and driveways and fix any cracks. Remove anything that might make you trip as you walk through a door, such as a raised step or threshold. Trim any bushes or trees on the path to your home. Use bright outdoor lighting. Clear any walking paths of anything that might make someone trip, such as rocks or tools. Regularly check to see if handrails are loose or broken. Make sure that both sides of any steps have handrails. Any raised decks and porches should have guardrails on the edges. Have any leaves, snow, or ice cleared regularly. Use sand or salt on walking paths during winter. Clean up any spills in your garage right away. This includes oil or grease spills. What can I do in the bathroom? Use night lights. Install grab bars by the toilet and in the tub and shower. Do not use towel bars as grab bars. Use non-skid mats or decals in the tub or shower. If you need to sit down in the shower, use a plastic, non-slip stool. Keep the floor dry. Clean up any water that spills on the floor as soon as it  happens. Remove soap buildup in the tub or shower regularly. Attach bath mats securely with double-sided non-slip rug tape. Do not have throw rugs and other things on the floor that can make you trip. What can I do in the bedroom? Use night lights. Make sure that you have a light by your bed that is easy to reach. Do not use any sheets or blankets that are too big for your bed. They should not hang down onto the floor. Have a firm chair that has side arms. You can use this for support while you get dressed. Do not have throw rugs and other things on the floor that can make you trip. What can I do in the kitchen? Clean up any spills right away. Avoid walking on wet floors. Keep items that you use a lot in easy-to-reach places. If you need to reach something above you, use a strong step stool that has a grab bar. Keep electrical cords out of the way. Do not use floor polish or wax that makes floors slippery. If you must use wax, use non-skid floor wax. Do not have  throw rugs and other things on the floor that can make you trip. What can I do with my stairs? Do not leave any items on the stairs. Make sure that there are handrails on both sides of the stairs and use them. Fix handrails that are broken or loose. Make sure that handrails are as long as the stairways. Check any carpeting to make sure that it is firmly attached to the stairs. Fix any carpet that is loose or worn. Avoid having throw rugs at the top or bottom of the stairs. If you do have throw rugs, attach them to the floor with carpet tape. Make sure that you have a light switch at the top of the stairs and the bottom of the stairs. If you do not have them, ask someone to add them for you. What else can I do to help prevent falls? Wear shoes that: Do not have high heels. Have rubber bottoms. Are comfortable and fit you well. Are closed at the toe. Do not wear sandals. If you use a stepladder: Make sure that it is fully opened.  Do not climb a closed stepladder. Make sure that both sides of the stepladder are locked into place. Ask someone to hold it for you, if possible. Clearly mark and make sure that you can see: Any grab bars or handrails. First and last steps. Where the edge of each step is. Use tools that help you move around (mobility aids) if they are needed. These include: Canes. Walkers. Scooters. Crutches. Turn on the lights when you go into a dark area. Replace any light bulbs as soon as they burn out. Set up your furniture so you have a clear path. Avoid moving your furniture around. If any of your floors are uneven, fix them. If there are any pets around you, be aware of where they are. Review your medicines with your doctor. Some medicines can make you feel dizzy. This can increase your chance of falling. Ask your doctor what other things that you can do to help prevent falls. This information is not intended to replace advice given to you by your health care provider. Make sure you discuss any questions you have with your health care provider. Document Released: 12/25/2008 Document Revised: 08/06/2015 Document Reviewed: 04/04/2014 Elsevier Interactive Patient Education  2017 [employer].

## 2021-04-07 NOTE — Progress Notes (Addendum)
Subjective:   Sherri Lee is a 66 y.o. female who presents for Medicare Annual (Subsequent) preventive examination.  Review of Systems    No ROS.  Medicare Wellness Virtual Visit.  Visual/audio telehealth visit, UTA vital signs.   See social history for additional risk factors.   Cardiac Risk Factors include: advanced age (>79mn, >>97women);hypertension     Objective:    Today's Vitals   04/07/21 1400  Weight: 169 lb (76.7 kg)  Height: 5' 5"  (1.651 m)   Body mass index is 28.12 kg/m.  Advanced Directives 04/07/2021 01/29/2021 08/01/2020 04/06/2020 10/21/2018 05/07/2017 05/07/2017  Does Patient Have a Medical Advance Directive? No No No No No No No  Would patient like information on creating a medical advance directive? No - Patient declined No - Patient declined No - Patient declined No - Patient declined No - Patient declined No - Patient declined No - Patient declined    Current Medications (verified) Outpatient Encounter Medications as of 04/07/2021  Medication Sig   acyclovir (ZOVIRAX) 400 MG tablet Take 1 tablet (400 mg total) by mouth 5 (five) times daily.   albuterol (VENTOLIN HFA) 108 (90 Base) MCG/ACT inhaler TAKE 2 PUFFS BY MOUTH EVERY 6 HOURS AS NEEDED FOR WHEEZE OR SHORTNESS OF BREATH   albuterol (VENTOLIN HFA) 108 (90 Base) MCG/ACT inhaler Inhale 2 puffs into the lungs every 6 (six) hours as needed for wheezing or shortness of breath.   amLODipine (NORVASC) 10 MG tablet Take 1 tablet (10 mg total) by mouth daily.   atorvastatin (LIPITOR) 20 MG tablet Take 1 tablet (20 mg total) by mouth daily.   butalbital-acetaminophen-caffeine (FIORICET) 50-325-40 MG tablet TAKE 1-2 TABLETS BY MOUTH EVERY 6 (SIX) HOURS AS NEEDED FOR HEADACHE.   calcium carbonate (OS-CAL) 600 MG TABS Take 600 mg by mouth 2 (two) times daily with a meal.   conjugated estrogens (PREMARIN) vaginal cream Place 1 Applicatorful vaginally every 30 (thirty) days. 2-3 x per month (Patient not  taking: Reported on 03/16/2021)   COVID-19 At Home Antigen Test (BINAXNOW COVID-19 AG HOME TEST) KIT Use as directed.   diclofenac Sodium (VOLTAREN) 1 % GEL Apply 2 g topically 4 (four) times daily.   doxycycline (VIBRA-TABS) 100 MG tablet Take 1 tablet (100 mg total) by mouth 2 (two) times daily for 10 days.   escitalopram (LEXAPRO) 20 MG tablet Take 20 mg by mouth daily.    fluticasone (FLONASE) 50 MCG/ACT nasal spray PLACE 2 SPRAYS INTO BOTH NOSTRILS DAILY AS NEEDED FOR ALLERGIES OR RHINITIS. AFTER NASAL SALINE   gabapentin (NEURONTIN) 100 MG capsule Take 1 capsule (100 mg total) by mouth 3 (three) times daily.   latanoprost (XALATAN) 0.005 % ophthalmic solution 1 drop at bedtime.   LORazepam (ATIVAN) 1 MG tablet TAKE 1 TABLET BY MOUTH TWICE A DAY AS DIRECTED FOR ANXIETY/PANIC   metoprolol succinate (TOPROL-XL) 50 MG 24 hr tablet Take 1 tablet (50 mg total) by mouth daily. Take with or immediately following a meal.   Multiple Vitamin (MULTIVITAMIN) tablet Take 1 tablet by mouth daily. Bariatric Vitamin   pantoprazole (PROTONIX) 40 MG tablet TAKE 1 TABLET BY MOUTH EVERY DAY   potassium chloride SA (KLOR-CON) 20 MEQ tablet Take 1 tablet (20 mEq total) by mouth daily.   traMADol (ULTRAM) 50 MG tablet TAKE 1 TABLET (50 MG TOTAL) BY MOUTH 2 (TWO) TIMES DAILY AS NEEDED FOR MODERATE PAIN. (Patient not taking: Reported on 03/16/2021)   VITAMIN D PO Take by mouth.   zolpidem (  AMBIEN) 10 MG tablet Take 10 mg by mouth at bedtime as needed for sleep.   No facility-administered encounter medications on file as of 04/07/2021.    Allergies (verified) Bacitracin-polymyxin b, Diclofenac, Eggs or egg-derived products, and Betadine [povidone iodine]   History: Past Medical History:  Diagnosis Date   Anemia    Low iron and hemoglobin   Anginal pain ([place])    states she thinks it is stress   Anxiety    Asthma    COVID-19    07/05/20   Depression    Dysrhythmia    states she has PVC's   Fibromyalgia     Hypertension    Recurrent vomiting 02/04/2018   Schatzki's ring    dialated 3 times by Tiffany Kocher    Past Surgical History:  Procedure Laterality Date   ABDOMINAL ADHESION SURGERY     BUNIONECTOMY     left foot    CERVICAL FUSION     CESAREAN SECTION     CHOLECYSTECTOMY  June 2013   GASTRIC BYPASS     Family History  Problem Relation Age of Onset   Hypertension Mother    Glaucoma Mother    Heart disease Father    Stroke Father    Breast cancer Maternal Aunt    Cancer Maternal Aunt    Colon cancer Neg Hx    Ovarian cancer Neg Hx    Social History   Socioeconomic History   Marital status: Divorced    Spouse name: Not on file   Number of children: Not on file   Years of education: Not on file   Highest education level: Not on file  Occupational History   Not on file  Tobacco Use   Smoking status: Never   Smokeless tobacco: Never  Substance and Sexual Activity   Alcohol use: Yes    Alcohol/week: 3.0 standard drinks    Types: 3 Glasses of wine per week   Drug use: No   Sexual activity: Yes  Other Topics Concern   Not on file  Social History Narrative   Lives with spouse, son, and stepson who has serious psychiatric issues and alcohol abuse   Social Determinants of Health   Financial Resource Strain: Low Risk    Difficulty of Paying Living Expenses: Not hard at all  Food Insecurity: No Food Insecurity   Worried About Charity fundraiser in the Last Year: Never true   [place] in the Last Year: Never true  Transportation Needs: No Transportation Needs   Lack of Transportation (Medical): No   Lack of Transportation (Non-Medical): No  Physical Activity: Not on file  Stress: No Stress Concern Present   Feeling of Stress : Not at all  Social Connections: Unknown   Frequency of Communication with Friends and Family: More than three times a week   Frequency of Social Gatherings with Friends and Family: More than three times a week   Attends Religious Services:  Not on Electrical engineer or Organizations: Not on file   Attends Archivist Meetings: Not on file   Marital Status: Not on file    Tobacco Counseling Counseling given: Not Answered   Clinical Intake:  Pre-visit preparation completed: Yes        Diabetes: No  How often do you need to have someone help you when you read instructions, pamphlets, or other written materials from your doctor or pharmacy?: 1 - Never  Interpreter Needed?: No  Activities of Daily Living In your present state of health, do you have any difficulty performing the following activities: 04/07/2021  Hearing? N  Vision? N  Difficulty concentrating or making decisions? N  Walking or climbing stairs? N  Dressing or bathing? N  Doing errands, shopping? N  Preparing Food and eating ? N  Using the Toilet? N  In the past six months, have you accidently leaked urine? N  Do you have problems with loss of bowel control? N  Managing your Medications? N  Managing your Finances? N  Housekeeping or managing your Housekeeping? N  Some recent data might be hidden    Patient Care Team: Crecencio Mc, MD as PCP - General (Internal Medicine)  Indicate any recent Medical Services you may have received from other than [facility] providers in the past year (date may be approximate).     Assessment:   This is a routine wellness examination for [place].  Virtual Visit via Telephone Note  I connected with  Brantley Persons on 04/07/21 at  2:00 PM EST by telephone and verified that I am speaking with the correct person using two identifiers.  Persons participating in the virtual visit: patient/Nurse Health Advisor   I discussed the limitations, risks, security and privacy concerns of performing an evaluation and management service by telephone and the availability of in person appointments. The patient expressed understanding and agreed to proceed.  Interactive audio and video  telecommunications were attempted between this nurse and patient, however failed, due to patient having technical difficulties OR patient did not have access to video capability.  We continued and completed visit with audio only.  Some vital signs may be absent or patient reported.   Hearing/Vision screen Hearing Screening - Comments:: Patient is able to hear conversational tones without difficulty.  No issues reported. Vision Screening - Comments:: Followed by [facility] Wears corrective lenses  Cataract extraction, bilateral They have seen their ophthalmologist in the last 12 months.   Dietary issues and exercise activities discussed: Current Exercise Habits: Home exercise routine, Intensity: Mild Regular diet Good water intake   Goals Addressed             This Visit's Progress    Weight (lb) < 166 lb (75.3 kg)   169 lb (76.7 kg)    I would like to lose about 10lb Increase activity        Depression Screen PHQ 2/9 Scores 04/07/2021 03/16/2021 11/10/2020 07/01/2020 04/06/2020 10/26/2018 06/26/2017  PHQ - 2 Score - 6 0 2 0 0 5  PHQ- 9 Score - 19 - 11 - - 20  Exception Documentation Other- indicate reason in comment box - - - - - -  Not completed Followed by [place], Dr. Kasandra Knudsen. - - - - - -    Fall Risk Fall Risk  03/16/2021 11/10/2020 09/29/2020 08/04/2020 07/08/2020  Falls in the past year? 1 1 1 1  0  Number falls in past yr: 0 0 0 1 0  Injury with Fall? 1 1 1  0 0  Risk for fall due to : History of fall(s) History of fall(s) History of fall(s) - -  Follow up Falls evaluation completed Falls evaluation completed Falls evaluation completed Falls evaluation completed Falls evaluation completed    [place]: Home free of loose throw rugs in walkways, pet beds, electrical cords, etc? Yes  Adequate lighting in your home to reduce risk of falls? Yes   ASSISTIVE DEVICES  UTILIZED TO PREVENT FALLS: Life alert? No  Use of a cane, walker  or w/c? No  Grab bars in the bathroom? No  Shower chair or bench in shower? No  Elevated toilet seat or a handicapped toilet? No   TIMED UP AND GO: Was the test performed? No .   Cognitive Function:     6CIT Screen 04/07/2021 04/06/2020  What Year? 0 points 0 points  What month? 0 points 0 points  What time? 0 points 0 points  Count back from 20 0 points 0 points  Months in reverse 0 points 0 points  Repeat phrase 0 points -  Total Score 0 -    Immunizations Immunization History  Administered Date(s) Administered   Fluad Quad(high Dose 65+) 02/13/2020   Hepatitis B 07/12/2012   Influenza,inj,Quad PF,6+ Mos 01/20/2014, 02/02/2018, 01/30/2019   Influenza-Unspecified 01/13/2012, 02/08/2021   MMR 07/12/2012   Moderna Covid-19 Vaccine Bivalent Booster 87yr & up 01/06/2021   PFIZER(Purple Top)SARS-COV-2 Vaccination 06/02/2019, 06/23/2019, 01/29/2020   PNEUMOCOCCAL CONJUGATE-20 10/07/2020   Tdap 01/13/2012, 07/12/2012   Shingrix Completed?: No.    Education has been provided regarding the importance of this vaccine. Patient has been advised to call insurance company to determine out of pocket expense if they have not yet received this vaccine. Advised may also receive vaccine at local pharmacy or Health Dept. Verbalized acceptance and understanding.  Screening Tests Health Maintenance  Topic Date Due   MAMMOGRAM  02/25/2017   DEXA SCAN  Never done   Zoster Vaccines- Shingrix (1 of 2) 07/06/2021 (Originally 12/22/1974)   TETANUS/TDAP  07/13/2022   COLONOSCOPY (Pts 45-438yrInsurance coverage will need to be confirmed)  07/30/2029   Pneumonia Vaccine 6580Years old  Completed   INFLUENZA VACCINE  Completed   COVID-19 Vaccine  Completed   Hepatitis C Screening  Completed   HPV VACCINES  Aged Out   Health Maintenance Health Maintenance Due  Topic Date Due   MAMMOGRAM  02/25/2017   DEXA SCAN  Never done   Mammogram - ordered 05/2020. Agrees to schedule.   Bone density-  ordered 05/2020. Agrees to schedule.   Lung Cancer Screening: (Low Dose CT Chest recommended if Age 67-80ears, 30 pack-year currently smoking OR have quit w/in 15years.) does not qualify.   Vision Screening: Recommended annual ophthalmology exams for early detection of glaucoma and other disorders of the eye.  Dental Screening: Recommended annual dental exams for proper oral hygiene  Community Resource Referral / Chronic Care Management: CRR required this visit?  No   CCM required this visit?  No      Plan:     I have personally reviewed and noted the following in the patients chart:   Medical and social history Use of alcohol, tobacco or illicit drugs  Current medications and supplements including opioid prescriptions.  Functional ability and status Nutritional status Physical activity Advanced directives List of other physicians Hospitalizations, surgeries, and ER visits in previous 12 months Vitals Screenings to include cognitive, depression, and falls Referrals and appointments  In addition, I have reviewed and discussed with patient certain preventive protocols, quality metrics, and best practice recommendations. A written personalized care plan for preventive services as well as general preventive health recommendations were provided to patient.     OBrien-Blaney, Alfredo Collymore L, LPN   03/19/62/4034   I have reviewed the above information and agree with above.   TeDeborra MedinaMD

## 2021-04-13 DIAGNOSIS — H16223 Keratoconjunctivitis sicca, not specified as Sjogren's, bilateral: Secondary | ICD-10-CM | POA: Diagnosis not present

## 2021-04-13 DIAGNOSIS — H43811 Vitreous degeneration, right eye: Secondary | ICD-10-CM | POA: Diagnosis not present

## 2021-04-13 DIAGNOSIS — H401131 Primary open-angle glaucoma, bilateral, mild stage: Secondary | ICD-10-CM | POA: Diagnosis not present

## 2021-05-14 ENCOUNTER — Ambulatory Visit (INDEPENDENT_AMBULATORY_CARE_PROVIDER_SITE_OTHER): Payer: Medicare Other | Admitting: Internal Medicine

## 2021-05-14 ENCOUNTER — Encounter: Payer: Self-pay | Admitting: Internal Medicine

## 2021-05-14 ENCOUNTER — Other Ambulatory Visit: Payer: Self-pay

## 2021-05-14 VITALS — BP 132/82 | HR 70 | Temp 98.2°F | Ht 65.0 in | Wt 172.0 lb

## 2021-05-14 DIAGNOSIS — R519 Headache, unspecified: Secondary | ICD-10-CM

## 2021-05-14 DIAGNOSIS — N3941 Urge incontinence: Secondary | ICD-10-CM

## 2021-05-14 DIAGNOSIS — Z8616 Personal history of COVID-19: Secondary | ICD-10-CM | POA: Diagnosis not present

## 2021-05-14 DIAGNOSIS — R350 Frequency of micturition: Secondary | ICD-10-CM | POA: Diagnosis not present

## 2021-05-14 DIAGNOSIS — Z1231 Encounter for screening mammogram for malignant neoplasm of breast: Secondary | ICD-10-CM | POA: Diagnosis not present

## 2021-05-14 MED ORDER — PREDNISONE 10 MG PO TABS: ORAL_TABLET | ORAL | 0 refills | Status: DC

## 2021-05-14 MED ORDER — PREMARIN 0.625 MG/GM VA CREA: 1.0000 | TOPICAL_CREAM | Freq: Every day | VAGINAL | 12 refills | Status: DC

## 2021-05-14 NOTE — Assessment & Plan Note (Signed)
Infected I Jan 2023/ still dealing with sinus congestion and vertigo.  Trial of prednisone  Continue flonase and add zyrtec ?

## 2021-05-14 NOTE — Patient Instructions (Addendum)
Continue flonase daily ? ?Consider adding a second dose of zyrtec daily  ? ?Adding prednisone taper : ? ?60 mg daily x 3 days ,  the reduce by one tablet daily until gone ? ? ?Blood pressure is doing well now.  No changes  ? ? ?

## 2021-05-14 NOTE — Progress Notes (Signed)
Subjective:  Patient ID: Sherri Lee, female    DOB: Jul 29, 1955  Age: 66 y.o. MRN: [id number]  CC: The primary encounter diagnosis was Urinary frequency. Diagnoses of Breast cancer screening by mammogram, History of COVID-19, Recurrent headache, and Urge incontinence of urine were also pertinent to this visit.   This visit occurred during the SARS-CoV-2 public health emergency.  Safety protocols were in place, including screening questions prior to the visit, additional usage of staff PPE, and extensive cleaning of exam room while observing appropriate contact time as indicated for disinfecting solutions.    HPI Sherri Lee presents for  Chief Complaint  Patient presents with   Follow-up    3 month follow up on hypertension   1) Patient is taking her medications as prescribed and notes no adverse effects.  Home BP readings have been done about once per week and are  generally < 130/80 .  She is avoiding added salt in her diet and walking regularly about 3 times per week for exercise  .   2) dizziness for the past 3 months .  Describved as vertigo,  occurs with sudden position change.  wonders if it is due to medication change from lexapro to trintellix.  Symptoms started after her  most recent COVID infection .  Reports persistent Sinus congestion without pain and without  purulent discharge   3) headaches:  chronic , frontal in location /bilateral.  Reviewed prior workup including   CT head ,  ESR normal   4) urinary frequency, hesitancy present intermittently for the last several weeks   Outpatient Medications Prior to Visit  Medication Sig Dispense Refill   acyclovir (ZOVIRAX) 400 MG tablet Take 1 tablet (400 mg total) by mouth 5 (five) times daily. 35 tablet 3   albuterol (VENTOLIN HFA) 108 (90 Base) MCG/ACT inhaler TAKE 2 PUFFS BY MOUTH EVERY 6 HOURS AS NEEDED FOR WHEEZE OR SHORTNESS OF BREATH 8.5 each 2   albuterol (VENTOLIN HFA) 108 (90 Base) MCG/ACT  inhaler Inhale 2 puffs into the lungs every 6 (six) hours as needed for wheezing or shortness of breath. 8 g 0   amLODipine (NORVASC) 10 MG tablet Take 1 tablet (10 mg total) by mouth daily. 90 tablet 1   atorvastatin (LIPITOR) 20 MG tablet Take 1 tablet (20 mg total) by mouth daily. 90 tablet 3   butalbital-acetaminophen-caffeine (FIORICET) 50-325-40 MG tablet TAKE 1-2 TABLETS BY MOUTH EVERY 6 (SIX) HOURS AS NEEDED FOR HEADACHE. 120 tablet 2   calcium carbonate (OS-CAL) 600 MG TABS Take 600 mg by mouth 2 (two) times daily with a meal.     diclofenac Sodium (VOLTAREN) 1 % GEL Apply 2 g topically 4 (four) times daily. 100 g G   escitalopram (LEXAPRO) 20 MG tablet Take 20 mg by mouth daily.      fluticasone (FLONASE) 50 MCG/ACT nasal spray PLACE 2 SPRAYS INTO BOTH NOSTRILS DAILY AS NEEDED FOR ALLERGIES OR RHINITIS. AFTER NASAL SALINE 48 mL 0   gabapentin (NEURONTIN) 100 MG capsule Take 1 capsule (100 mg total) by mouth 3 (three) times daily. 270 capsule 3   latanoprost (XALATAN) 0.005 % ophthalmic solution 1 drop at bedtime.     LORazepam (ATIVAN) 1 MG tablet TAKE 1 TABLET BY MOUTH TWICE A DAY AS DIRECTED FOR ANXIETY/PANIC  2   metoprolol succinate (TOPROL-XL) 50 MG 24 hr tablet Take 1 tablet (50 mg total) by mouth daily. Take with or immediately following a meal. 30 tablet 2  Multiple Vitamin (MULTIVITAMIN) tablet Take 1 tablet by mouth daily. Bariatric Vitamin     pantoprazole (PROTONIX) 40 MG tablet TAKE 1 TABLET BY MOUTH EVERY DAY 90 tablet 3   potassium chloride SA (KLOR-CON) 20 MEQ tablet Take 1 tablet (20 mEq total) by mouth daily. 5 tablet 0   VITAMIN D PO Take by mouth.     zolpidem (AMBIEN) 10 MG tablet Take 10 mg by mouth at bedtime as needed for sleep.     COVID-19 At Home Antigen Test [facility] COVID-19 AG HOME TEST) KIT Use as directed. 1 kit 0   conjugated estrogens (PREMARIN) vaginal cream Place 1 Applicatorful vaginally every 30 (thirty) days. 2-3 x per month (Patient not taking:  Reported on 05/14/2021)     traMADol (ULTRAM) 50 MG tablet TAKE 1 TABLET (50 MG TOTAL) BY MOUTH 2 (TWO) TIMES DAILY AS NEEDED FOR MODERATE PAIN. (Patient not taking: Reported on 05/14/2021) 60 tablet 0   No facility-administered medications prior to visit.    Review of Systems;  Patient denies headache, fevers, malaise, unintentional weight loss, skin rash, eye pain, sinus congestion and sinus pain, sore throat, dysphagia,  hemoptysis , cough, dyspnea, wheezing, chest pain, palpitations, orthopnea, edema, abdominal pain, nausea, melena, diarrhea, constipation, flank pain, dysuria, hematuria, urinary  Frequency, nocturia, numbness, tingling, seizures,  Focal weakness, Loss of consciousness,  Tremor, insomnia, depression, anxiety, and suicidal ideation.      Objective:  BP 132/82 (BP Location: Left Arm, Patient Position: Sitting, Cuff Size: Normal)    Pulse 70    Temp 98.2 F (36.8 C) (Oral)    Ht _0  (1.651 m)    Wt 172 lb (78 kg)    SpO2 98%    BMI 28.62 kg/m   BP Readings from Last 3 Encounters:  05/14/21 132/82  03/11/21 110/72  02/12/21 136/80    Wt Readings from Last 3 Encounters:  05/14/21 172 lb (78 kg)  04/07/21 169 lb (76.7 kg)  03/16/21 169 lb (76.7 kg)    General appearance: alert, cooperative and appears stated age Ears: normal TM's and external ear canals both ears Throat: lips, mucosa, and tongue normal; teeth and gums normal Neck: no adenopathy, no carotid bruit, supple, symmetrical, trachea midline and thyroid not enlarged, symmetric, no tenderness/mass/nodules Back: symmetric, no curvature. ROM normal. No CVA tenderness. Lungs: clear to auscultation bilaterally Heart: regular rate and rhythm, S1, S2 normal, no murmur, click, rub or gallop Abdomen: soft, non-tender; bowel sounds normal; no masses,  no organomegaly Pulses: 2+ and symmetric Skin: Skin color, texture, turgor normal. No rashes or lesions Lymph nodes: Cervical, supraclavicular, and axillary nodes  normal.  Lab Results  Component Value Date   HGBA1C 6.4 09/01/2020   HGBA1C 5.9 05/28/2020   HGBA1C 5.7 07/29/2019    Lab Results  Component Value Date   CREATININE 0.65 01/29/2021   CREATININE 0.74 10/07/2020   CREATININE 0.61 09/01/2020    Lab Results  Component Value Date   WBC 5.1 01/29/2021   HGB 11.9 (L) 01/29/2021   HCT 37.4 01/29/2021   PLT 233 01/29/2021   GLUCOSE 143 (H) 01/29/2021   CHOL 174 09/01/2020   TRIG 55.0 09/01/2020   HDL 84.90 09/01/2020   LDLDIRECT 99.0 09/04/2015   LDLCALC 79 09/01/2020   ALT 18 09/01/2020   AST 16 09/01/2020   NA 140 01/29/2021   K 3.5 01/29/2021   CL 106 01/29/2021   CREATININE 0.65 01/29/2021   BUN 7 (L) 01/29/2021   CO2 26 01/29/2021  TSH 0.59 07/29/2019   HGBA1C 6.4 09/01/2020   MICROALBUR 0.6 09/04/2015    CT HEAD WO CONTRAST (5MM)  Result Date: 01/29/2021 CLINICAL DATA:  Headache, new or worsening (Age >= 50y) EXAM: CT HEAD WITHOUT CONTRAST TECHNIQUE: Contiguous axial images were obtained from the base of the skull through the vertex without intravenous contrast. COMPARISON:  CT head 10/21/2018 FINDINGS: Brain: No evidence of acute infarction, hemorrhage, hydrocephalus, extra-axial collection or mass lesion/mass effect. Vascular: No hyperdense vessel identified. Intracranial atherosclerosis. Skull: No acute fracture. Sinuses/Orbits: Clear sinuses.  Unremarkable orbits. Other: No mastoid effusions. IMPRESSION: No evidence of acute intracranial abnormality. Electronically Signed   By: Margaretha Sheffield M.D.   On: 01/29/2021 17:52    Assessment & Plan:   Problem List Items Addressed This Visit     History of COVID-19    Infected I Jan 2023/ still dealing with sinus congestion and vertigo.  Trial of prednisone  Continue flonase and add zyrtec      Recurrent headache    Occurring several days per week, bilateral frontal.  Previous reports of pain involving the right temple were investigated with ESR and CT head.    Continue tylenol and fioricet limited to 3 times per week   Lab Results  Component Value Date   ESRSEDRATE 14 02/12/2021         Urge incontinence of urine    Current symptoms were investigated to rule out UTI ; UA and culture were normal/negative       Other Visit Diagnoses     Urinary frequency    -  Primary   Relevant Orders   Urinalysis, Routine w reflex microscopic (Completed)   Urine Culture (Completed)   Breast cancer screening by mammogram       Relevant Orders   MM 3D SCREEN BREAST BILATERAL       I spent 30 minutes dedicated to the care of this patient on the date of this encounter to include pre-visit review of patient's medical history,  most recent imaging studies, Face-to-face time with the patient , and post visit ordering of testing and therapeutics.    Follow-up: No follow-ups on file.   Crecencio Mc, MD

## 2021-05-16 LAB — URINALYSIS, ROUTINE W REFLEX MICROSCOPIC
Bilirubin Urine: NEGATIVE
Glucose, UA: NEGATIVE
Hgb urine dipstick: NEGATIVE
Ketones, ur: NEGATIVE
Leukocytes,Ua: NEGATIVE
Nitrite: NEGATIVE
Protein, ur: NEGATIVE
Specific Gravity, Urine: 1.016 (ref 1.001–1.035)
pH: 6 (ref 5.0–8.0)

## 2021-05-16 LAB — URINE CULTURE
MICRO NUMBER:: 13085372
Result:: NO GROWTH
SPECIMEN QUALITY:: ADEQUATE

## 2021-05-16 NOTE — Assessment & Plan Note (Signed)
Current symptoms were investigated to rule out UTI ; UA and culture were normal/negative  ?

## 2021-05-16 NOTE — Assessment & Plan Note (Signed)
Occurring several days per week, bilateral frontal.  Previous reports of pain involving the right temple were investigated with ESR and CT head.   Continue tylenol and fioricet limited to 3 times per week  ? ?Lab Results  ?Component Value Date  ? ESRSEDRATE 14 02/12/2021  ? ? ?

## 2021-05-31 ENCOUNTER — Other Ambulatory Visit: Payer: Self-pay | Admitting: Internal Medicine

## 2021-06-08 ENCOUNTER — Other Ambulatory Visit: Payer: Self-pay

## 2021-06-08 ENCOUNTER — Telehealth (INDEPENDENT_AMBULATORY_CARE_PROVIDER_SITE_OTHER): Payer: Medicare Other | Admitting: Family Medicine

## 2021-06-08 ENCOUNTER — Encounter: Payer: Self-pay | Admitting: Family Medicine

## 2021-06-08 DIAGNOSIS — R0981 Nasal congestion: Secondary | ICD-10-CM

## 2021-06-08 DIAGNOSIS — R062 Wheezing: Secondary | ICD-10-CM

## 2021-06-08 DIAGNOSIS — R059 Cough, unspecified: Secondary | ICD-10-CM

## 2021-06-08 MED ORDER — BENZONATATE 100 MG PO CAPS: ORAL_CAPSULE | ORAL | 0 refills | Status: DC

## 2021-06-08 MED ORDER — DOXYCYCLINE HYCLATE 100 MG PO TABS: 100.0000 mg | ORAL_TABLET | Freq: Two times a day (BID) | ORAL | 0 refills | Status: DC

## 2021-06-08 MED ORDER — PREDNISONE 20 MG PO TABS: 40.0000 mg | ORAL_TABLET | Freq: Every day | ORAL | 0 refills | Status: DC

## 2021-06-08 NOTE — Progress Notes (Signed)
Virtual Visit via Video Note ? ?I connected with Sherri Lee ? on 06/08/21 at  3:20 PM EDT by a video enabled telemedicine application and verified that I am speaking with the correct person using two identifiers. ? Location patient: [name] ?Location provider:work or home office ?Persons participating in the virtual visit: patient, provider ? ?I discussed the limitations and requested verbal permission for telemedicine visit. The patient expressed understanding and agreed to proceed. ? ? ?HPI: ? ?Acute telemedicine visit for a cough and congestion: ?-Onset: 3 days ago, she has allergies and has a lot of congestion at baseline with those at this time of the year ?-Symptoms include: sinus congestion, cough, now reports feels like it is in her chest as has a bad cough and some mild SOB at times - she is concerned because she has had asthma issues in the past and required inhalers and steroids, a little wheezing at times, some body aches, has a little headache, has a lot of yellow mucus when she coughs and from the nose ?-Denies:CP, fever, NVD ?-someone she was around last week was sick and had to go to the hospital ?-Has tried:flonase, she did take her albuterol today - thinks helped a little, but not as much as she expected, OTC cough medication ?-Pertinent past medical history: see below ?-Pertinent medication allergies: ?Allergies  ?Allergen Reactions  ? Bacitracin-Polymyxin B Rash  ? Eggs Or Egg-Derived Products   ? Betadine [Povidone Iodine] Rash  ?-COVID-19 vaccine status:  ?Immunization History  ?Administered Date(s) Administered  ? Fluad Quad(high Dose 65+) 02/13/2020  ? Hepatitis B 07/12/2012  ? Influenza,inj,Quad PF,6+ Mos 01/20/2014, 02/02/2018, 01/30/2019  ? Influenza-Unspecified 01/13/2012, 02/08/2021  ? MMR 07/12/2012  ? Moderna Covid-19 Vaccine Bivalent Booster 40yr & up 01/06/2021  ? PFIZER(Purple Top)SARS-COV-2 Vaccination 06/02/2019, 06/23/2019, 01/29/2020  ? PNEUMOCOCCAL CONJUGATE-20 10/07/2020  ? Tdap  01/13/2012, 07/12/2012  ? ? ? ?ROS: See pertinent positives and negatives per HPI. ? ?Past Medical History:  ?Diagnosis Date  ? Anemia   ? Low iron and hemoglobin  ? Anginal pain ([facility]   ? states she thinks it is stress  ? Anxiety   ? Asthma   ? COVID-19   ? 07/05/20  ? Depression   ? Dysrhythmia   ? states she has PVC's  ? Fibromyalgia   ? Hypertension   ? Recurrent vomiting 02/04/2018  ? Schatzki's ring   ? dialated 3 times by ETiffany Kocher  ? ? ?Past Surgical History:  ?Procedure Laterality Date  ? ABDOMINAL ADHESION SURGERY    ? BUNIONECTOMY    ? left foot   ? CERVICAL FUSION    ? CESAREAN SECTION    ? CHOLECYSTECTOMY  June 2013  ? GASTRIC BYPASS    ? ? ? ?Current Outpatient Medications:  ?  acyclovir (ZOVIRAX) 400 MG tablet, Take 1 tablet (400 mg total) by mouth 5 (five) times daily., Disp: 35 tablet, Rfl: 3 ?  albuterol (VENTOLIN HFA) 108 (90 Base) MCG/ACT inhaler, TAKE 2 PUFFS BY MOUTH EVERY 6 HOURS AS NEEDED FOR WHEEZE OR SHORTNESS OF BREATH, Disp: 8.5 each, Rfl: 2 ?  albuterol (VENTOLIN HFA) 108 (90 Base) MCG/ACT inhaler, Inhale 2 puffs into the lungs every 6 (six) hours as needed for wheezing or shortness of breath., Disp: 8 g, Rfl: 0 ?  amLODipine (NORVASC) 10 MG tablet, Take 1 tablet (10 mg total) by mouth daily., Disp: 90 tablet, Rfl: 1 ?  amLODipine (NORVASC) 2.5 MG tablet, TAKE 1 TABLET BY MOUTH EVERY DAY, Disp:  90 tablet, Rfl: 0 ?  atorvastatin (LIPITOR) 20 MG tablet, Take 1 tablet (20 mg total) by mouth daily., Disp: 90 tablet, Rfl: 3 ?  benzonatate (TESSALON PERLES) 100 MG capsule, 1-2 capsules up to twice daily as needed for cough, Disp: 30 capsule, Rfl: 0 ?  butalbital-acetaminophen-caffeine (FIORICET) 50-325-40 MG tablet, TAKE 1-2 TABLETS BY MOUTH EVERY 6 (SIX) HOURS AS NEEDED FOR HEADACHE., Disp: 120 tablet, Rfl: 2 ?  calcium carbonate (OS-CAL) 600 MG TABS, Take 600 mg by mouth 2 (two) times daily with a meal., Disp: , Rfl:  ?  conjugated estrogens (PREMARIN) vaginal cream, Place 1 Applicatorful  vaginally every 30 (thirty) days. 2-3 x per month, Disp: , Rfl:  ?  conjugated estrogens (PREMARIN) vaginal cream, Place 1 Applicatorful vaginally daily. FOR 2 WEEKS,  THEN TWICE WEEKLY THEREAFTER, Disp: 42.5 g, Rfl: 12 ?  diclofenac Sodium (VOLTAREN) 1 % GEL, Apply 2 g topically 4 (four) times daily., Disp: 100 g, Rfl: G ?  doxycycline (VIBRA-TABS) 100 MG tablet, Take 1 tablet (100 mg total) by mouth 2 (two) times daily., Disp: 14 tablet, Rfl: 0 ?  escitalopram (LEXAPRO) 20 MG tablet, Take 20 mg by mouth daily. , Disp: , Rfl:  ?  fluticasone (FLONASE) 50 MCG/ACT nasal spray, PLACE 2 SPRAYS INTO BOTH NOSTRILS DAILY AS NEEDED FOR ALLERGIES OR RHINITIS. AFTER NASAL SALINE, Disp: 48 mL, Rfl: 0 ?  gabapentin (NEURONTIN) 100 MG capsule, Take 1 capsule (100 mg total) by mouth 3 (three) times daily., Disp: 270 capsule, Rfl: 3 ?  latanoprost (XALATAN) 0.005 % ophthalmic solution, 1 drop at bedtime., Disp: , Rfl:  ?  LORazepam (ATIVAN) 1 MG tablet, TAKE 1 TABLET BY MOUTH TWICE A DAY AS DIRECTED FOR ANXIETY/PANIC, Disp: , Rfl: 2 ?  metoprolol succinate (TOPROL-XL) 50 MG 24 hr tablet, Take 1 tablet (50 mg total) by mouth daily. Take with or immediately following a meal., Disp: 30 tablet, Rfl: 2 ?  Multiple Vitamin (MULTIVITAMIN) tablet, Take 1 tablet by mouth daily. Bariatric Vitamin, Disp: , Rfl:  ?  pantoprazole (PROTONIX) 40 MG tablet, TAKE 1 TABLET BY MOUTH EVERY DAY, Disp: 90 tablet, Rfl: 3 ?  potassium chloride SA (KLOR-CON) 20 MEQ tablet, Take 1 tablet (20 mEq total) by mouth daily., Disp: 5 tablet, Rfl: 0 ?  predniSONE (DELTASONE) 20 MG tablet, Take 2 tablets (40 mg total) by mouth daily with breakfast., Disp: 8 tablet, Rfl: 0 ?  traMADol (ULTRAM) 50 MG tablet, TAKE 1 TABLET (50 MG TOTAL) BY MOUTH 2 (TWO) TIMES DAILY AS NEEDED FOR MODERATE PAIN., Disp: 60 tablet, Rfl: 0 ?  VITAMIN D PO, Take by mouth., Disp: , Rfl:  ?  zolpidem (AMBIEN) 10 MG tablet, Take 10 mg by mouth at bedtime as needed for sleep., Disp: , Rfl:   ? ?EXAM: ? ?VITALS per patient if applicable: ? ?GENERAL: alert, oriented, appears to be in no acute distress ? ?HEENT: atraumatic, conjunttiva clear, no obvious abnormalities on inspection of external nose and ears ? ?NECK: normal movements of the head and neck ? ?LUNGS: on inspection no signs of respiratory distress, breathing rate appears normal, no obvious gross SOB, gasping or wheezing, coughing at times throughout the visit ? ?CV: no obvious cyanosis ? ?MS: moves all visible extremities without noticeable abnormality ? ?PSYCH/NEURO: pleasant and cooperative, no obvious depression or anxiety, speech and thought processing grossly intact ? ?ASSESSMENT AND PLAN: ? ?Discussed the following assessment and plan: ? ?Cough, unspecified type ? ?Nasal sinus congestion ? ?Wheeze ? ?-  we discussed possible serious and likely etiologies, options for evaluation and workup, limitations of telemedicine visit vs in person visit, treatment, treatment risks and precautions. Pt is agreeable to treatment via telemedicine at this moment. Query viral resp illness with bronchitis, bacterial resp infection vs other. She has opted to do home covid testing and we discussed that she can do a virtual visit or call a [facility] pharmacy if positive and wishes to do an antiviral. She has opted to try adding prednisone, a cough medication and symptomatic care measures per patient instructions. If worsening or not improving will tart doxy - she wanted and rx for this to have on hand. Discussed risks and appropriate use of each medication. ?Advised to seek prompt virtual visit or in person care if worsening, new symptoms arise, or if is not improving with treatment as expected per our conversation of expected course. Discussed options for follow up care. Did let this patient know that I do telemedicine on Tuesdays and Thursdays for [facility] and those are the days I am logged into the system. Advised to schedule follow up visit with PCP,  [facility] virtual visits or [facility] if any further questions or concerns to avoid delays in care. ?  ?I discussed the assessment and treatment plan with the patient. The patient was provided an opportunity to ask question

## 2021-06-08 NOTE — Patient Instructions (Signed)
?  HOME CARE TIPS: ? ?-COVID19 testing information: ?ForwardDrop.tn ? ?Most pharmacies also offer testing and home test kits. If the Covid19 test is positive and you desire antiviral treatment, please contact a [facility] or schedule a follow up virtual visit through your primary care office or through the [facility].  ?Other test to treat options: ?ConnectRV.[email or web address]?click_[email or web address]=alert ? ?-I sent the medication(s) we discussed to your pharmacy: ?Meds ordered this encounter  ?Medications  ? predniSONE (DELTASONE) 20 MG tablet  ?  Sig: Take 2 tablets (40 mg total) by mouth daily with breakfast.  ?  Dispense:  8 tablet  ?  Refill:  0  ? benzonatate (TESSALON PERLES) 100 MG capsule  ?  Sig: 1-2 capsules up to twice daily as needed for cough  ?  Dispense:  30 capsule  ?  Refill:  0  ? doxycycline (VIBRA-TABS) 100 MG tablet  ?  Sig: Take 1 tablet (100 mg total) by mouth 2 (two) times daily.  ?  Dispense:  14 tablet  ?  Refill:  0  ? ?-albuterol 2 puffs every 4-6 hours as needed  ? ?-can use tylenol if needed for fevers, aches and pains per instructions ? ?-nasal saline sinus rinses twice daily ? ?-stay hydrated, drink plenty of fluids and eat small healthy meals - avoid dairy ? ?-follow up with your doctor in 2-3 days unless improving and feeling better ? ?-stay home while sick, except to seek medical care. If you have COVID19, you will likely be contagious for 7-10 days. Flu or Influenza is likely contagious for about 7 days. Other respiratory viral infections remain contagious for 5-10+ days depending on the virus and many other factors. Wear a good mask that fits snugly (such as N95 or KN95) if around others to reduce the risk of transmission. ? ?It was nice to meet you today, and I really hope you are feeling better soon. I help [facility] out with telemedicine visits on Tuesdays and Thursdays and am happy to help if you need a follow up virtual visit on  those days. Otherwise, if you have any concerns or questions following this visit please schedule a follow up visit with your Primary Care doctor or seek care at a local urgent care clinic to avoid delays in care.  ? ? ?Seek in person care or schedule a follow up video visit promptly if your symptoms worsen, new concerns arise or you are not improving with treatment. Call 911 and/or seek emergency care if your symptoms are severe or life threatening. ? ? ?

## 2021-06-10 ENCOUNTER — Other Ambulatory Visit: Payer: Self-pay | Admitting: Internal Medicine

## 2021-06-14 ENCOUNTER — Encounter: Payer: Self-pay | Admitting: Internal Medicine

## 2021-06-14 ENCOUNTER — Telehealth: Payer: Self-pay

## 2021-06-14 ENCOUNTER — Telehealth (INDEPENDENT_AMBULATORY_CARE_PROVIDER_SITE_OTHER): Payer: Medicare Other | Admitting: Internal Medicine

## 2021-06-14 VITALS — Ht 65.0 in | Wt 172.0 lb

## 2021-06-14 DIAGNOSIS — J22 Unspecified acute lower respiratory infection: Secondary | ICD-10-CM

## 2021-06-14 DIAGNOSIS — J209 Acute bronchitis, unspecified: Secondary | ICD-10-CM | POA: Insufficient documentation

## 2021-06-14 DIAGNOSIS — J440 Chronic obstructive pulmonary disease with acute lower respiratory infection: Secondary | ICD-10-CM | POA: Diagnosis not present

## 2021-06-14 MED ORDER — PREDNISONE 10 MG PO TABS: ORAL_TABLET | ORAL | 0 refills | Status: DC

## 2021-06-14 MED ORDER — HYDROCOD POLI-CHLORPHE POLI ER 10-8 MG/5ML PO SUER: 5.0000 mL | Freq: Every evening | ORAL | 0 refills | Status: DC | PRN

## 2021-06-14 MED ORDER — LEVOFLOXACIN 500 MG PO TABS: 500.0000 mg | ORAL_TABLET | Freq: Every day | ORAL | 0 refills | Status: AC

## 2021-06-14 NOTE — Telephone Encounter (Signed)
--  Caller states needs prescription medication refilled. ?States she does not feel any better with antibiotics. ?States wheezing at night for the last couple of nights, ?coughing, and her sinus hurts, congestion. Sick since ?last Saturday, currently on antibiotics. Hx of asthma. ?COVID negative. Denies fever. ? ?06/11/2021 5:16:57 PM Go to ED Now Jeneen Rinks, RN, [facility] ? ?Comments ?User: Sandy Salaam, RN Date/Time Eilene Ghazi Time): 06/11/2021 5:16:14 PM ?states difficulty breathing in sinuses and chest but seems to be worse in chest ? ?Referrals ?[facility] - ED ? ?06/14/21 1000: Pt was seen virtually by Dr Maudie Mercury on 06/08/21 (see note). ?

## 2021-06-14 NOTE — Assessment & Plan Note (Addendum)
Levaquin,  8 day prednisone taper,  Tussionex sent to [facility] for symptoms that did not respond to 4 day prednisone , doxycycline .  HOME TESTS FOR COVID HAVE BEEN NEGATIVE X 2 . Advised to obtain chest x ray if no improvement in 48 hours  ?

## 2021-06-14 NOTE — Telephone Encounter (Signed)
Spoke with pt and scheduled her for this afternoon with Dr. Darrick Huntsman.  ?

## 2021-06-14 NOTE — Progress Notes (Signed)
Virtual Visit via CAREGILITY Note ? ?This visit type was conducted due to national recommendations for restrictions regarding the COVID-19 pandemic (e.g. social distancing).  This format is felt to be most appropriate for this patient at this time.  All issues noted in this document were discussed and addressed.  No physical exam was performed (except for noted visual exam findings with Video Visits).  ? ?I connected withNAME@ on 06/14/21 at  4:45 PM EDT by a video enabled telemedicine application  and verified that I am speaking with the correct person using two identifiers. ?Location patient: home ?Location provider: work or home office ?Persons participating in the virtual visit: patient, provider ? ?I discussed the limitations, risks, security and privacy concerns of performing an evaluation and management service by telephone and the availability of in person appointments. I also discussed with the patient that there may be a patient responsible charge related to this service. The patient expressed understanding and agreed to proceed. ? ?Reason for visit: BRONCHITIS ? ?HPI: ? ?66 yr old female presents with wheezing,  shortness of breath, cough and sinus congestion that started on Saturday march 25,  was treated by another provider on March 28 with doxycycline, prednisone 40 mg qd x 4,  and tessalon perles with  no significant improvement in symptoms  ? ? ?ROS: See pertinent positives and negatives per HPI. ? ?Past Medical History:  ?Diagnosis Date  ? Anemia   ? Low iron and hemoglobin  ? Anginal pain (HCC)   ? states she thinks it is stress  ? Anxiety   ? Asthma   ? COVID-19   ? 07/05/20  ? Depression   ? Dysrhythmia   ? states she has PVC's  ? Fibromyalgia   ? Hypertension   ? Recurrent vomiting 02/04/2018  ? Schatzki's ring   ? dialated 3 times by Markham Jordan   ? ? ?Past Surgical History:  ?Procedure Laterality Date  ? ABDOMINAL ADHESION SURGERY    ? BUNIONECTOMY    ? left foot   ? CERVICAL FUSION    ? CESAREAN  SECTION    ? CHOLECYSTECTOMY  June 2013  ? GASTRIC BYPASS    ? ? ?Family History  ?Problem Relation Age of Onset  ? Hypertension Mother   ? Glaucoma Mother   ? Heart disease Father   ? Stroke Father   ? Breast cancer Maternal Aunt   ? Cancer Maternal Aunt   ? Colon cancer Neg Hx   ? Ovarian cancer Neg Hx   ? ? ?SOCIAL HX:  reports that she has never smoked. She has never used smokeless tobacco. She reports current alcohol use of about 3.0 standard drinks per week. She reports that she does not use drugs. LIVES ALONE  ? ? ?Current Outpatient Medications:  ?  acyclovir (ZOVIRAX) 400 MG tablet, Take 1 tablet (400 mg total) by mouth 5 (five) times daily., Disp: 35 tablet, Rfl: 3 ?  albuterol (VENTOLIN HFA) 108 (90 Base) MCG/ACT inhaler, TAKE 2 PUFFS BY MOUTH EVERY 6 HOURS AS NEEDED FOR WHEEZE OR SHORTNESS OF BREATH, Disp: 8.5 each, Rfl: 2 ?  amLODipine (NORVASC) 10 MG tablet, Take 1 tablet (10 mg total) by mouth daily., Disp: 90 tablet, Rfl: 1 ?  atorvastatin (LIPITOR) 20 MG tablet, TAKE 1 TABLET BY MOUTH EVERY DAY, Disp: 90 tablet, Rfl: 3 ?  benzonatate (TESSALON PERLES) 100 MG capsule, 1-2 capsules up to twice daily as needed for cough, Disp: 30 capsule, Rfl: 0 ?  butalbital-acetaminophen-caffeine (FIORICET)  50-325-40 MG tablet, TAKE 1-2 TABLETS BY MOUTH EVERY 6 (SIX) HOURS AS NEEDED FOR HEADACHE., Disp: 120 tablet, Rfl: 2 ?  calcium carbonate (OS-CAL) 600 MG TABS, Take 600 mg by mouth 2 (two) times daily with a meal., Disp: , Rfl:  ?  chlorpheniramine-HYDROcodone (TUSSIONEX PENNKINETIC ER) 10-8 MG/5ML, Take 5 mLs by mouth at bedtime as needed., Disp: 140 mL, Rfl: 0 ?  conjugated estrogens (PREMARIN) vaginal cream, Place 1 Applicatorful vaginally daily. FOR 2 WEEKS,  THEN TWICE WEEKLY THEREAFTER, Disp: 42.5 g, Rfl: 12 ?  diclofenac Sodium (VOLTAREN) 1 % GEL, Apply 2 g topically 4 (four) times daily., Disp: 100 g, Rfl: G ?  escitalopram (LEXAPRO) 20 MG tablet, Take 20 mg by mouth daily. , Disp: , Rfl:  ?  fluticasone  (FLONASE) 50 MCG/ACT nasal spray, PLACE 2 SPRAYS INTO BOTH NOSTRILS DAILY AS NEEDED FOR ALLERGIES OR RHINITIS. AFTER NASAL SALINE, Disp: 48 mL, Rfl: 0 ?  gabapentin (NEURONTIN) 100 MG capsule, Take 1 capsule (100 mg total) by mouth 3 (three) times daily., Disp: 270 capsule, Rfl: 3 ?  latanoprost (XALATAN) 0.005 % ophthalmic solution, 1 drop at bedtime., Disp: , Rfl:  ?  levofloxacin (LEVAQUIN) 500 MG tablet, Take 1 tablet (500 mg total) by mouth daily for 7 days., Disp: 7 tablet, Rfl: 0 ?  LORazepam (ATIVAN) 1 MG tablet, TAKE 1 TABLET BY MOUTH TWICE A DAY AS DIRECTED FOR ANXIETY/PANIC, Disp: , Rfl: 2 ?  metoprolol succinate (TOPROL-XL) 50 MG 24 hr tablet, Take 1 tablet (50 mg total) by mouth daily. Take with or immediately following a meal., Disp: 30 tablet, Rfl: 2 ?  Multiple Vitamin (MULTIVITAMIN) tablet, Take 1 tablet by mouth daily. Bariatric Vitamin, Disp: , Rfl:  ?  pantoprazole (PROTONIX) 40 MG tablet, TAKE 1 TABLET BY MOUTH EVERY DAY, Disp: 90 tablet, Rfl: 3 ?  predniSONE (DELTASONE) 10 MG tablet, 6 tablets daily for 3 days, then reduce by 1 tablet daily until gone, Disp: 33 tablet, Rfl: 0 ?  VITAMIN D PO, Take by mouth., Disp: , Rfl:  ?  zolpidem (AMBIEN) 10 MG tablet, Take 10 mg by mouth at bedtime as needed for sleep., Disp: , Rfl:  ? ?EXAM: ? ?VITALS per patient if applicable: ? ?GENERAL: alert, oriented, appears SICKLY  BUT  in no acute distress ? ?HEENT: atraumatic, conjunttiva clear, no obvious abnormalities on inspection of external nose and ears ? ?NECK: normal movements of the head and neck ? ?LUNGS: on inspection no signs of respiratory distress, breathing rate appears normal, no obvious gross SOB, gasping or wheezing.  Conversation interrupted with cough  ? ?CV: no obvious cyanosis ? ?MS: moves all visible extremities without noticeable abnormality ? ?PSYCH/NEURO: pleasant and cooperative, no obvious depression or anxiety, speech and thought processing grossly intact ? ?ASSESSMENT AND  PLAN: ? ?Discussed the following assessment and plan: ? ?Lower resp. tract infection - Plan: DG Chest 2 View ? ?Acute bronchitis with COPD (HCC) ? ?Acute bronchitis with COPD (HCC) ?Levaquin,  8 day prednisone taper,  [facility] that did not respond to 4 day prednisone , doxycycline .  HOME TESTS FOR COVID HAVE BEEN NEGATIVE X 2 . Advised to obtain chest x ray if no improvement in 48 hours  ? ?  ?I discussed the assessment and treatment plan with the patient. The patient was provided an opportunity to ask questions and all were answered. The patient agreed with the plan and demonstrated an understanding of the instructions. ?  ?  The patient was advised to call back or seek an in-person evaluation if the symptoms worsen or if the condition fails to improve as anticipated. ? ? ?I spent  20 minutes dedicated to the care of this patient on the date of this encounter to include pre-visit review of her medical history,   recent virtual visit,  today's Face-to-face time with the patient , and post visit ordering of testing and therapeutics.  ? ? ?Sherlene Shamseresa L [place] Mielke, MD   ?

## 2021-06-17 ENCOUNTER — Other Ambulatory Visit: Payer: Self-pay | Admitting: Internal Medicine

## 2021-06-17 DIAGNOSIS — J329 Chronic sinusitis, unspecified: Secondary | ICD-10-CM

## 2021-07-06 ENCOUNTER — Ambulatory Visit (INDEPENDENT_AMBULATORY_CARE_PROVIDER_SITE_OTHER): Payer: Medicare Other | Admitting: Internal Medicine

## 2021-07-06 ENCOUNTER — Encounter: Payer: Self-pay | Admitting: Internal Medicine

## 2021-07-06 ENCOUNTER — Ambulatory Visit (INDEPENDENT_AMBULATORY_CARE_PROVIDER_SITE_OTHER): Payer: Medicare Other

## 2021-07-06 VITALS — BP 138/102 | HR 102 | Temp 98.0°F | Ht 65.0 in | Wt 172.6 lb

## 2021-07-06 DIAGNOSIS — R058 Other specified cough: Secondary | ICD-10-CM | POA: Diagnosis not present

## 2021-07-06 DIAGNOSIS — I700 Atherosclerosis of aorta: Secondary | ICD-10-CM

## 2021-07-06 DIAGNOSIS — R059 Cough, unspecified: Secondary | ICD-10-CM | POA: Diagnosis not present

## 2021-07-06 DIAGNOSIS — J440 Chronic obstructive pulmonary disease with acute lower respiratory infection: Secondary | ICD-10-CM | POA: Diagnosis not present

## 2021-07-06 DIAGNOSIS — J209 Acute bronchitis, unspecified: Secondary | ICD-10-CM

## 2021-07-06 LAB — COMPREHENSIVE METABOLIC PANEL
ALT: 25 U/L (ref 0–35)
AST: 20 U/L (ref 0–37)
Albumin: 3.7 g/dL (ref 3.5–5.2)
Alkaline Phosphatase: 96 U/L (ref 39–117)
BUN: 7 mg/dL (ref 6–23)
CO2: 28 mEq/L (ref 19–32)
Calcium: 8.6 mg/dL (ref 8.4–10.5)
Chloride: 101 mEq/L (ref 96–112)
Creatinine, Ser: 0.69 mg/dL (ref 0.40–1.20)
GFR: 90.36 mL/min (ref >60.00)
Glucose, Bld: 110 mg/dL — ABNORMAL HIGH (ref 70–99)
Potassium: 3 mEq/L — ABNORMAL LOW (ref 3.5–5.1)
Sodium: 137 mEq/L (ref 135–145)
Total Bilirubin: 0.3 mg/dL (ref 0.2–1.2)
Total Protein: 6.5 g/dL (ref 6.0–8.3)

## 2021-07-06 LAB — CBC WITH DIFFERENTIAL/PLATELET
Basophils Absolute: 0.1 10*3/uL (ref 0.0–0.1)
Basophils Relative: 0.9 % (ref 0.0–3.0)
Eosinophils Absolute: 0.3 10*3/uL (ref 0.0–0.7)
Eosinophils Relative: 5 % (ref 0.0–5.0)
HCT: 35 % — ABNORMAL LOW (ref 36.0–46.0)
Hemoglobin: 11.3 g/dL — ABNORMAL LOW (ref 12.0–15.0)
Lymphocytes Relative: 20.8 % (ref 12.0–46.0)
Lymphs Abs: 1.3 10*3/uL (ref 0.7–4.0)
MCHC: 32.3 g/dL (ref 30.0–36.0)
MCV: 86.3 fl (ref 78.0–100.0)
Monocytes Absolute: 0.6 10*3/uL (ref 0.1–1.0)
Monocytes Relative: 9 % (ref 3.0–12.0)
Neutro Abs: 4.1 10*3/uL (ref 1.4–7.7)
Neutrophils Relative %: 64.3 % (ref 43.0–77.0)
Platelets: 199 10*3/uL (ref 150.0–400.0)
RBC: 4.06 Mil/uL (ref 3.87–5.11)
RDW: 14.3 % (ref 11.5–15.5)
WBC: 6.4 10*3/uL (ref 4.0–10.5)

## 2021-07-06 MED ORDER — DOXYCYCLINE HYCLATE 100 MG PO TABS: 100.0000 mg | ORAL_TABLET | Freq: Two times a day (BID) | ORAL | 0 refills | Status: DC

## 2021-07-06 MED ORDER — PREDNISONE 10 MG PO TABS: ORAL_TABLET | ORAL | 0 refills | Status: DC

## 2021-07-06 MED ORDER — HYDROCOD POLI-CHLORPHE POLI ER 10-8 MG/5ML PO SUER: 5.0000 mL | Freq: Every evening | ORAL | 0 refills | Status: DC | PRN

## 2021-07-06 MED ORDER — BENZONATATE 200 MG PO CAPS: 200.0000 mg | ORAL_CAPSULE | Freq: Three times a day (TID) | ORAL | 0 refills | Status: DC | PRN

## 2021-07-06 NOTE — Patient Instructions (Signed)
I am repeating the prednisone taper and refilling both cough medications ? ?STOP THE SUDAFED.  IT IS RAISING YOUR BLOOD PRESSURE ? ?USE generic Zyrtec twice daily along with flonase  AND ADD AFRIN NASAL SPRAY TWICE DAILY FOR 5 DAYS ? ? ?Start the doxycycline antibiotic.  The chest x ray will help me decide if you need all 2 weeks of it, or just one week  ?

## 2021-07-06 NOTE — Assessment & Plan Note (Signed)
No improvement with levaquin.  Repeating therapy with prednisone and doxycycline pending chest x ray  ?

## 2021-07-06 NOTE — Progress Notes (Signed)
? ?Subjective:  ?Patient ID: Sherri Lee, female    DOB: Nov 26, 1955  Age: 66 y.o. MRN: [id number] ? ?CC: The primary encounter diagnosis was Cough present for greater than 3 weeks. A diagnosis of Acute bronchitis with COPD ([place]) was also pertinent to this visit. ? ? ?This visit occurred during the SARS-CoV-2 public health emergency.  Safety protocols were in place, including screening questions prior to the visit, additional usage of staff PPE, and extensive cleaning of exam room while observing appropriate contact time as indicated for disinfecting solutions.   ? ?HPI ?Sherri Lee presents for persistent productive cough lasting > 3 weeks  ?Chief Complaint  ?Patient presents with  ? Acute Visit  ?  Cough and congestion x several weeks  ? ?Hx:  66 yr old female with no history of tobacco abuse presents for follow up after treatment for sinusitis/bronchitis on April 3.  I treated her via virtual visit on April 3 for sinusitis symptoms that had started on March 25  that was initially treated by Colin Benton on March 28 with doxycycline and benzonotate capsules.  Symptoms had not improved so I changed abx to levaquin, added a tapering dose of prednisone,  and tussionex for nighttime cough. She states that the symptoms initially improved but never resolved  and now has a  cough productive of yellow /green sputum, bilateral ear pressure, frontal sinus pain with headache, and right ear irritation with conjunctival drainage.  She denies  fevers. ? ?She has finished the prednisone and levaquin.  She  has also been taking flonase and zyrtec daily since treatment,  and has used her albuterol MDI several times daily for chest tightness and/or wheezin g.  Right eye has become irritated and had mild drainage that matted her eyelids together yesterday ?Frontal sinus pressure  using zyrtec with sudafed , last dose this morning . Not sleeping due to cough  ? ?Outpatient Medications Prior to Visit   ?Medication Sig Dispense Refill  ? acyclovir (ZOVIRAX) 400 MG tablet Take 1 tablet (400 mg total) by mouth 5 (five) times daily. 35 tablet 3  ? albuterol (VENTOLIN HFA) 108 (90 Base) MCG/ACT inhaler TAKE 2 PUFFS BY MOUTH EVERY 6 HOURS AS NEEDED FOR WHEEZE OR SHORTNESS OF BREATH 8.5 each 2  ? amLODipine (NORVASC) 10 MG tablet Take 1 tablet (10 mg total) by mouth daily. 90 tablet 1  ? atorvastatin (LIPITOR) 20 MG tablet TAKE 1 TABLET BY MOUTH EVERY DAY 90 tablet 3  ? butalbital-acetaminophen-caffeine (FIORICET) 50-325-40 MG tablet TAKE 1-2 TABLETS BY MOUTH EVERY 6 (SIX) HOURS AS NEEDED FOR HEADACHE. 120 tablet 2  ? calcium carbonate (OS-CAL) 600 MG TABS Take 600 mg by mouth 2 (two) times daily with a meal.    ? conjugated estrogens (PREMARIN) vaginal cream Place 1 Applicatorful vaginally daily. FOR 2 WEEKS,  THEN TWICE WEEKLY THEREAFTER 42.5 g 12  ? diclofenac Sodium (VOLTAREN) 1 % GEL Apply 2 g topically 4 (four) times daily. 100 g G  ? escitalopram (LEXAPRO) 20 MG tablet Take 20 mg by mouth daily.     ? fluticasone (FLONASE) 50 MCG/ACT nasal spray PLACE 2 SPRAYS INTO BOTH NOSTRILS DAILY AS NEEDED FOR ALLERGIES OR RHINITIS. AFTER NASAL SALINE 48 mL 0  ? gabapentin (NEURONTIN) 100 MG capsule Take 1 capsule (100 mg total) by mouth 3 (three) times daily. 270 capsule 3  ? latanoprost (XALATAN) 0.005 % ophthalmic solution 1 drop at bedtime.    ? LORazepam (ATIVAN) 1 MG tablet TAKE  1 TABLET BY MOUTH TWICE A DAY AS DIRECTED FOR ANXIETY/PANIC  2  ? metoprolol succinate (TOPROL-XL) 50 MG 24 hr tablet Take 1 tablet (50 mg total) by mouth daily. Take with or immediately following a meal. 30 tablet 2  ? Multiple Vitamin (MULTIVITAMIN) tablet Take 1 tablet by mouth daily. Bariatric Vitamin    ? pantoprazole (PROTONIX) 40 MG tablet TAKE 1 TABLET BY MOUTH EVERY DAY 90 tablet 3  ? VITAMIN D PO Take by mouth.    ? zolpidem (AMBIEN) 10 MG tablet Take 10 mg by mouth at bedtime as needed for sleep.    ? benzonatate (TESSALON PERLES)  100 MG capsule 1-2 capsules up to twice daily as needed for cough (Patient not taking: Reported on 07/06/2021) 30 capsule 0  ? chlorpheniramine-HYDROcodone (TUSSIONEX PENNKINETIC ER) 10-8 MG/5ML Take 5 mLs by mouth at bedtime as needed. (Patient not taking: Reported on 07/06/2021) 140 mL 0  ? predniSONE (DELTASONE) 10 MG tablet 6 tablets daily for 3 days, then reduce by 1 tablet daily until gone (Patient not taking: Reported on 07/06/2021) 33 tablet 0  ? ?No facility-administered medications prior to visit.  ? ? ?Review of Systems; ? ?Patient denies headache, fevers, malaise, unintentional weight loss, skin rash, eye pain, sinus congestion and sinus pain, sore throat, dysphagia,  hemoptysis , cough, dyspnea, wheezing, chest pain, palpitations, orthopnea, edema, abdominal pain, nausea, melena, diarrhea, constipation, flank pain, dysuria, hematuria, urinary  Frequency, nocturia, numbness, tingling, seizures,  Focal weakness, Loss of consciousness,  Tremor, insomnia, depression, anxiety, and suicidal ideation.   ? ? ? ?Objective:  ?BP (!) 138/102 (BP Location: Left Arm, Patient Position: Sitting, Cuff Size: Normal)   Pulse (!) 102   Temp 98 ?F (36.7 ?C) (Oral)   Ht 5\' 5"  (1.651 m)   Wt 172 lb 9.6 oz (78.3 kg)   SpO2 98%   BMI 28.72 kg/m?  ? ?BP Readings from Last 3 Encounters:  ?07/06/21 (!) 138/102  ?05/14/21 132/82  ?03/11/21 110/72  ? ? ?Wt Readings from Last 3 Encounters:  ?07/06/21 172 lb 9.6 oz (78.3 kg)  ?06/14/21 172 lb (78 kg)  ?05/14/21 172 lb (78 kg)  ? ? ?General appearance: alert, cooperative and appears stated age ?Ears: normal TM's and external ear canals both ears ?Throat: lips, mucosa, and tongue normal; teeth and gums normal ?Neck: no adenopathy, no carotid bruit, supple, symmetrical, trachea midline and thyroid not enlarged, symmetric, no tenderness/mass/nodules ?Back: symmetric, no curvature. ROM normal. No CVA tenderness. ?Lungs: clear to auscultation bilaterally ?Heart: regular rate and rhythm,  S1, S2 normal, no murmur, click, rub or gallop ?Abdomen: soft, non-tender; bowel sounds normal; no masses,  no organomegaly ?Pulses: 2+ and symmetric ?Skin: Skin color, texture, turgor normal. No rashes or lesions ?Lymph nodes: Cervical, supraclavicular, and axillary nodes normal. ? ?Lab Results  ?Component Value Date  ? HGBA1C 6.4 09/01/2020  ? HGBA1C 5.9 05/28/2020  ? HGBA1C 5.7 07/29/2019  ? ? ?Lab Results  ?Component Value Date  ? CREATININE 0.65 01/29/2021  ? CREATININE 0.74 10/07/2020  ? CREATININE 0.61 09/01/2020  ? ? ?Lab Results  ?Component Value Date  ? WBC 5.1 01/29/2021  ? HGB 11.9 (L) 01/29/2021  ? HCT 37.4 01/29/2021  ? PLT 233 01/29/2021  ? GLUCOSE 143 (H) 01/29/2021  ? CHOL 174 09/01/2020  ? TRIG 55.0 09/01/2020  ? HDL 84.90 09/01/2020  ? LDLDIRECT 99.0 09/04/2015  ? [place] 79 09/01/2020  ? ALT 18 09/01/2020  ? AST 16 09/01/2020  ? NA  140 01/29/2021  ? K 3.5 01/29/2021  ? CL 106 01/29/2021  ? CREATININE 0.65 01/29/2021  ? BUN 7 (L) 01/29/2021  ? CO2 26 01/29/2021  ? TSH 0.59 07/29/2019  ? HGBA1C 6.4 09/01/2020  ? MICROALBUR 0.6 09/04/2015  ? ? ?CT HEAD WO CONTRAST (5MM) ? ?Result Date: 01/29/2021 ?CLINICAL DATA:  Headache, new or worsening (Age >= 50y) EXAM: CT HEAD WITHOUT CONTRAST TECHNIQUE: Contiguous axial images were obtained from the base of the skull through the vertex without intravenous contrast. COMPARISON:  CT head 10/21/2018 FINDINGS: Brain: No evidence of acute infarction, hemorrhage, hydrocephalus, extra-axial collection or mass lesion/mass effect. Vascular: No hyperdense vessel identified. Intracranial atherosclerosis. Skull: No acute fracture. Sinuses/Orbits: Clear sinuses.  Unremarkable orbits. Other: No mastoid effusions. IMPRESSION: No evidence of acute intracranial abnormality. Electronically Signed   By: Margaretha Sheffield M.D.   On: 01/29/2021 17:52  ? ? ?Assessment & Plan:  ? ?Problem List Items Addressed This Visit   ? ? Acute bronchitis with COPD ([place])  ?  No improvement with  levaquin.  Repeating therapy with prednisone and doxycycline pending chest x ray  ? ?  ?  ? Relevant Medications  ? benzonatate (TESSALON) 200 MG capsule  ? predniSONE (DELTASONE) 10 MG tablet  ? chlorpheniramine-

## 2021-07-08 ENCOUNTER — Other Ambulatory Visit: Payer: Self-pay | Admitting: Internal Medicine

## 2021-07-08 DIAGNOSIS — E876 Hypokalemia: Secondary | ICD-10-CM

## 2021-07-08 MED ORDER — POTASSIUM CHLORIDE CRYS ER 20 MEQ PO TBCR: 20.0000 meq | EXTENDED_RELEASE_TABLET | Freq: Two times a day (BID) | ORAL | 0 refills | Status: DC

## 2021-07-30 ENCOUNTER — Ambulatory Visit (INDEPENDENT_AMBULATORY_CARE_PROVIDER_SITE_OTHER): Payer: Medicare Other | Admitting: Internal Medicine

## 2021-07-30 ENCOUNTER — Encounter: Payer: Self-pay | Admitting: Internal Medicine

## 2021-07-30 VITALS — BP 132/80 | HR 82 | Temp 97.9°F | Resp 12 | Ht 65.0 in | Wt 170.8 lb

## 2021-07-30 DIAGNOSIS — M47814 Spondylosis without myelopathy or radiculopathy, thoracic region: Secondary | ICD-10-CM

## 2021-07-30 DIAGNOSIS — M7989 Other specified soft tissue disorders: Secondary | ICD-10-CM

## 2021-07-30 DIAGNOSIS — Z9889 Other specified postprocedural states: Secondary | ICD-10-CM

## 2021-07-30 DIAGNOSIS — M19019 Primary osteoarthritis, unspecified shoulder: Secondary | ICD-10-CM

## 2021-07-30 DIAGNOSIS — M62838 Other muscle spasm: Secondary | ICD-10-CM

## 2021-07-30 DIAGNOSIS — M5030 Other cervical disc degeneration, unspecified cervical region: Secondary | ICD-10-CM | POA: Diagnosis not present

## 2021-07-30 DIAGNOSIS — M47812 Spondylosis without myelopathy or radiculopathy, cervical region: Secondary | ICD-10-CM | POA: Diagnosis not present

## 2021-07-30 DIAGNOSIS — E876 Hypokalemia: Secondary | ICD-10-CM

## 2021-07-30 MED ORDER — TRAMADOL HCL 50 MG PO TABS: 50.0000 mg | ORAL_TABLET | Freq: Two times a day (BID) | ORAL | 0 refills | Status: AC | PRN

## 2021-07-30 MED ORDER — CYCLOBENZAPRINE HCL 5 MG PO TABS: 5.0000 mg | ORAL_TABLET | Freq: Every evening | ORAL | 2 refills | Status: DC | PRN

## 2021-07-30 NOTE — Progress Notes (Signed)
Chief Complaint  Patient presents with   Neck Pain    Pt c/o HA in R temple which radiates to to neck and shoulders. Hx of cervical spine & rotator cuff surgery. Pt has been ambulating sick mom and she is concerned she might have sprained neck.    Leg Swelling    Bilaterally took 1 pill of mom's furosemide for swelling. Minor improvement.    F/u  1. C/o ACDF in 2011 with right sided h/a, right sided neck pain and right shoulder pain h/o cervical mid back and right shoulder arthritis she also has h/o left rotator cuff surgery  She has been assisting her 67 y.o mother who has physical deconditioning and had to lift her up 07/26/21 which was start of moderate pain for her tried otc voltaren gel   2. Leg swelling b/l she has been doing salt on norvasc 10 mg qd she did take ? Dose of moms lasix   3. hypoK will repeat labs today given high K food list   Review of Systems  Constitutional:  Negative for weight loss.  HENT:  Negative for hearing loss.   Eyes:  Negative for blurred vision.  Respiratory:  Negative for shortness of breath.   Cardiovascular:  Negative for chest pain.  Gastrointestinal:  Negative for abdominal pain and blood in stool.  Genitourinary:  Negative for dysuria.  Musculoskeletal:  Positive for joint pain and neck pain. Negative for falls.  Skin:  Negative for rash.  Neurological:  Negative for headaches.  Psychiatric/Behavioral:  Negative for depression.   Past Medical History:  Diagnosis Date   Anemia    Low iron and hemoglobin   Anginal pain ([facility])    states she thinks it is stress   Anxiety    Asthma    COVID-19    07/05/20, 03/2021   Depression    Dysrhythmia    states she has PVC's   Fibromyalgia    Hypertension    Recurrent vomiting 02/04/2018   Schatzki's ring    dialated 3 times by Markham JordanElliot    Past Surgical History:  Procedure Laterality Date   ABDOMINAL ADHESION SURGERY     BUNIONECTOMY     left foot    CERVICAL FUSION     CESAREAN SECTION      CHOLECYSTECTOMY  June 2013   GASTRIC BYPASS     Family History  Problem Relation Age of Onset   Hypertension Mother    Glaucoma Mother    Heart disease Father    Stroke Father    Breast cancer Maternal Aunt    Cancer Maternal Aunt    Colon cancer Neg Hx    Ovarian cancer Neg Hx    Social History   Socioeconomic History   Marital status: Divorced    Spouse name: Not on file   Number of children: Not on file   Years of education: Not on file   Highest education level: Not on file  Occupational History   Not on file  Tobacco Use   Smoking status: Never   Smokeless tobacco: Never  Substance and Sexual Activity   Alcohol use: Yes    Alcohol/week: 3.0 standard drinks    Types: 3 Glasses of wine per week   Drug use: No   Sexual activity: Yes  Other Topics Concern   Not on file  Social History Narrative   Lives with spouse, son, and stepson who has serious psychiatric issues and alcohol abuse   Social Determinants of  Health   Financial Resource Strain: Low Risk    Difficulty of Paying Living Expenses: Not hard at all  Food Insecurity: No Food Insecurity   Worried About Running Out of Food in the Last Year: Never true   Ran Out of Food in the Last Year: Never true  Transportation Needs: No Transportation Needs   Lack of Transportation (Medical): No   Lack of Transportation (Non-Medical): No  Physical Activity: Not on file  Stress: No Stress Concern Present   Feeling of Stress : Not at all  Social Connections: Unknown   Frequency of Communication with Friends and Family: More than three times a week   Frequency of Social Gatherings with Friends and Family: More than three times a week   Attends Religious Services: Not on Scientist, clinical (histocompatibility and immunogenetics) or Organizations: Not on file   Attends Banker Meetings: Not on file   Marital Status: Not on file  Intimate Partner Violence: Not At Risk   Fear of Current or Ex-Partner: No   Emotionally Abused: No    Physically Abused: No   Sexually Abused: No   Current Meds  Medication Sig   acyclovir (ZOVIRAX) 400 MG tablet Take 1 tablet (400 mg total) by mouth 5 (five) times daily.   albuterol (VENTOLIN HFA) 108 (90 Base) MCG/ACT inhaler TAKE 2 PUFFS BY MOUTH EVERY 6 HOURS AS NEEDED FOR WHEEZE OR SHORTNESS OF BREATH   amLODipine (NORVASC) 10 MG tablet Take 1 tablet (10 mg total) by mouth daily.   atorvastatin (LIPITOR) 20 MG tablet TAKE 1 TABLET BY MOUTH EVERY DAY   butalbital-acetaminophen-caffeine (FIORICET) 50-325-40 MG tablet TAKE 1-2 TABLETS BY MOUTH EVERY 6 (SIX) HOURS AS NEEDED FOR HEADACHE.   calcium carbonate (OS-CAL) 600 MG TABS Take 600 mg by mouth 2 (two) times daily with a meal.   cyclobenzaprine (FLEXERIL) 5 MG tablet Take 1 tablet (5 mg total) by mouth at bedtime as needed for muscle spasms.   diclofenac Sodium (VOLTAREN) 1 % GEL Apply 2 g topically 4 (four) times daily.   escitalopram (LEXAPRO) 20 MG tablet Take 20 mg by mouth daily.    fluticasone (FLONASE) 50 MCG/ACT nasal spray PLACE 2 SPRAYS INTO BOTH NOSTRILS DAILY AS NEEDED FOR ALLERGIES OR RHINITIS. AFTER NASAL SALINE   gabapentin (NEURONTIN) 100 MG capsule Take 1 capsule (100 mg total) by mouth 3 (three) times daily.   latanoprost (XALATAN) 0.005 % ophthalmic solution 1 drop at bedtime.   LORazepam (ATIVAN) 1 MG tablet TAKE 1 TABLET BY MOUTH TWICE A DAY AS DIRECTED FOR ANXIETY/PANIC   metoprolol succinate (TOPROL-XL) 50 MG 24 hr tablet Take 1 tablet (50 mg total) by mouth daily. Take with or immediately following a meal.   Multiple Vitamin (MULTIVITAMIN) tablet Take 1 tablet by mouth daily. Bariatric Vitamin   pantoprazole (PROTONIX) 40 MG tablet TAKE 1 TABLET BY MOUTH EVERY DAY   potassium chloride SA (KLOR-CON M) 20 MEQ tablet Take 1 tablet (20 mEq total) by mouth 2 (two) times daily.   predniSONE (DELTASONE) 10 MG tablet 6 tablets daily for 3 days, then reduce by 1 tablet daily until gone   traMADol (ULTRAM) 50 MG tablet Take  1 tablet (50 mg total) by mouth 2 (two) times daily as needed for up to 5 days.   VITAMIN D PO Take by mouth.   zolpidem (AMBIEN) 10 MG tablet Take 10 mg by mouth at bedtime as needed for sleep.   Allergies  Allergen Reactions  Bacitracin-Polymyxin B Rash   Eggs Or Egg-Derived Products    Betadine [Povidone Iodine] Rash   Recent Results (from the past 2160 hour(s))  Urinalysis, Routine w reflex microscopic     Status: None   Collection Time: 05/14/21  4:55 PM  Result Value Ref Range   Color, Urine YELLOW YELLOW   APPearance CLEAR CLEAR   Specific Gravity, Urine 1.016 1.001 - 1.035   pH 6.0 5.0 - 8.0   Glucose, UA NEGATIVE NEGATIVE   Bilirubin Urine NEGATIVE NEGATIVE   Ketones, ur NEGATIVE NEGATIVE   Hgb urine dipstick NEGATIVE NEGATIVE   Protein, ur NEGATIVE NEGATIVE   Nitrite NEGATIVE NEGATIVE   Leukocytes,Ua NEGATIVE NEGATIVE  Urine Culture     Status: None   Collection Time: 05/14/21  4:55 PM   Specimen: Urine  Result Value Ref Range   MICRO NUMBER: [id number]    SPECIMEN QUALITY: Adequate    Sample Source URINE    STATUS: FINAL    Result: No Growth   Comprehensive metabolic panel     Status: Abnormal   Collection Time: 07/06/21 11:19 AM  Result Value Ref Range   Sodium 137 135 - 145 mEq/L   Potassium 3.0 (L) 3.5 - 5.1 mEq/L   Chloride 101 96 - 112 mEq/L   CO2 28 19 - 32 mEq/L   Glucose, Bld 110 (H) 70 - 99 mg/dL   BUN 7 6 - 23 mg/dL   Creatinine, Ser 5.40 0.40 - 1.20 mg/dL   Total Bilirubin 0.3 0.2 - 1.2 mg/dL   Alkaline Phosphatase 96 39 - 117 U/L   AST 20 0 - 37 U/L   ALT 25 0 - 35 U/L   Total Protein 6.5 6.0 - 8.3 g/dL   Albumin 3.7 3.5 - 5.2 g/dL   GFR 98.11 >91.47 mL/min    Comment: Calculated using the CKD-EPI Creatinine Equation (2021)   Calcium 8.6 8.4 - 10.5 mg/dL  CBC with Differential/Platelet     Status: Abnormal   Collection Time: 07/06/21 11:19 AM  Result Value Ref Range   WBC 6.4 4.0 - 10.5 K/uL   RBC 4.06 3.87 - 5.11 Mil/uL   Hemoglobin  11.3 (L) 12.0 - 15.0 g/dL   HCT 82.9 (L) 56.2 - 13.0 %   MCV 86.3 78.0 - 100.0 fl   MCHC 32.3 30.0 - 36.0 g/dL   RDW 86.5 78.4 - 69.6 %   Platelets 199.0 150.0 - 400.0 K/uL   Neutrophils Relative % 64.3 43.0 - 77.0 %   Lymphocytes Relative 20.8 12.0 - 46.0 %   Monocytes Relative 9.0 3.0 - 12.0 %   Eosinophils Relative 5.0 0.0 - 5.0 %   Basophils Relative 0.9 0.0 - 3.0 %   Neutro Abs 4.1 1.4 - 7.7 K/uL   Lymphs Abs 1.3 0.7 - 4.0 K/uL   Monocytes Absolute 0.6 0.1 - 1.0 K/uL   Eosinophils Absolute 0.3 0.0 - 0.7 K/uL   Basophils Absolute 0.1 0.0 - 0.1 K/uL   Objective  Body mass index is 28.42 kg/m. Wt Readings from Last 3 Encounters:  07/30/21 170 lb 12.8 oz (77.5 kg)  07/06/21 172 lb 9.6 oz (78.3 kg)  06/14/21 172 lb (78 kg)   Temp Readings from Last 3 Encounters:  07/30/21 97.9 F (36.6 C) (Oral)  07/06/21 98 F (36.7 C) (Oral)  05/14/21 98.2 F (36.8 C) (Oral)   BP Readings from Last 3 Encounters:  07/30/21 132/80  07/06/21 (!) 138/102  05/14/21 132/82   Pulse Readings  from Last 3 Encounters:  07/30/21 82  07/06/21 (!) 102  05/14/21 70    Physical Exam Vitals and nursing note reviewed.  Constitutional:      Appearance: Normal appearance. She is well-developed and well-groomed.  HENT:     Head: Normocephalic and atraumatic.  Eyes:     Conjunctiva/sclera: Conjunctivae normal.     Pupils: Pupils are equal, round, and reactive to light.  Cardiovascular:     Rate and Rhythm: Normal rate and regular rhythm.     Heart sounds: Normal heart sounds. No murmur heard. Pulmonary:     Effort: Pulmonary effort is normal.     Breath sounds: Normal breath sounds.  Abdominal:     General: Abdomen is flat. Bowel sounds are normal.     Tenderness: There is no abdominal tenderness.  Musculoskeletal:       Arms:     Cervical back: Tenderness present. Pain with movement present.  Skin:    General: Skin is warm and dry.  Neurological:     General: No focal deficit  present.     Mental Status: She is alert and oriented to person, place, and time. Mental status is at baseline.     Cranial Nerves: Cranial nerves 2-12 are intact.     Motor: Motor function is intact.     Coordination: Coordination is intact.     Gait: Gait is intact.  Psychiatric:        Attention and Perception: Attention and perception normal.        Mood and Affect: Mood and affect normal.        Speech: Speech normal.        Behavior: Behavior normal. Behavior is cooperative.        Thought Content: Thought content normal.        Cognition and Memory: Cognition and memory normal.        Judgment: Judgment normal.    Assessment  Plan  Cervical arthritis s/p ACDF in 2011 cervical with also DDD- Plan: cyclobenzaprine (FLEXERIL) 5 MG tablet, traMADol (ULTRAM) 50 MG tablet, Ambulatory referral to Orthopedic Surgery, Ambulatory referral to Physical Medicine Rehab For all consider PT in the future but primary care taker for her mom will hold for now   Shoulder arthritis - Plan: traMADol (ULTRAM) 50 MG tablet, Ambulatory referral to Orthopedic Surgery, Ambulatory referral to Physical Medicine Rehab  Thoracic arthritis - Plan: traMADol (ULTRAM) 50 MG tablet, Ambulatory referral to Orthopedic Surgery, Ambulatory referral to Physical Medicine Rehab  Muscle spasm - Plan: cyclobenzaprine (FLEXERIL) 5 MG tablet  H/O cervical spine surgery - Plan: Ambulatory referral to Physical Medicine Rehab  DDD (degenerative disc disease), cervical - Plan: Ambulatory referral to Physical Medicine Rehab  Hypokalemia - Plan: Basic Metabolic Panel (BMET), Magnesium, Given high K food list  Avoid lasix now for leg swelling due to low K  Pt currently out of K pills   Leg swelling  Likely due to salt intake but also consider norvasc 10 mg  Rec elevation for now  See above   Provider: Dr. French Ana McLean-Scocuzza-Internal Medicine

## 2021-07-30 NOTE — Patient Instructions (Addendum)
Dr. Sharlet Salina   Phone Fax E-mail Address  [phone] Not available [place] [address]     Specialties     Physical Medicine and Rehabilitation       ortho  [facility]   [place], [place] [address]   [place], [place],  [address]   [phone] (Work)   [phone] (Fax    Peripheral Edema  Peripheral edema is swelling that is caused by a buildup of fluid. Peripheral edema most often affects the lower legs, ankles, and feet. It can also develop in the arms, hands, and face. The area of the body that has peripheral edema will look swollen. It may also feel heavy or warm. Your clothes may start to feel tight. Pressing on the area may make a temporary dent in your skin (pitting edema). You may not be able to move your swollen arm or leg as much as usual. There are many causes of peripheral edema. It can happen because of a complication of other conditions such as heart failure, kidney disease, or a problem with your circulation. It also can be a side effect of certain medicines or happen because of an infection. It often happens to women during pregnancy. Sometimes, the cause is not known. Follow these instructions at home: Managing pain, stiffness, and swelling  Raise (elevate) your legs while you are sitting or lying down. Move around often to prevent stiffness and to reduce swelling. Do not sit or stand for long periods of time. Do not wear tight clothing. Do not wear garters on your upper legs. Exercise your legs to get your circulation going. This helps to move the fluid back into your blood vessels, and it may help the swelling go down. Wear compression stockings as told by your health care provider. These stockings help to prevent blood clots and reduce swelling in your legs. It is important that these are the correct size. These  stockings should be prescribed by your doctor to prevent possible injuries. If elastic bandages or wraps are recommended, use them as told by your health care provider. Medicines Take over-the-counter and prescription medicines only as told by your health care provider. Your health care provider may prescribe medicine to help your body get rid of excess water (diuretic). Take this medicine if you are told to take it. General instructions Eat a low-salt (low-sodium) diet as told by your health care provider. Sometimes, eating less salt may reduce swelling. Pay attention to any changes in your symptoms. Moisturize your skin daily to help prevent skin from cracking and draining. Keep all follow-up visits. This is important. Contact a health care provider if: You have a fever. You have swelling in only one leg. You have increased swelling, redness, or pain in one or both of your legs. You have drainage or sores at the area where you have edema. Get help right away if: You have edema that starts suddenly or is getting worse, especially if you are pregnant or have a medical condition. You develop shortness of breath, especially when you are lying down. You have pain in your chest or abdomen. You feel weak. You feel like you will faint. These symptoms may be an emergency. Get help right away. Call 911. Do not wait to see if the symptoms will go away. Do not drive yourself to the hospital.  Summary Peripheral edema is swelling that is caused by a buildup of fluid. Peripheral edema most often affects the lower legs, ankles, and feet. Move around often to prevent stiffness and to reduce swelling. Do not sit or stand for long periods of time. Pay attention to any changes in your symptoms. Contact a health care provider if you have edema that starts suddenly or is getting worse, especially if you are pregnant or have a medical condition. Get help right away if you develop shortness of breath,  especially when lying down. This information is not intended to replace advice given to you by your health care provider. Make sure you discuss any questions you have with your health care provider. Document Revised: 11/02/2020 Document Reviewed: 11/02/2020 Elsevier Patient Education  [place].  Hypokalemia Hypokalemia means that the amount of potassium in the blood is lower than normal. Potassium is a mineral (electrolyte) that helps regulate the amount of fluid in the body. It also stimulates muscle tightening (contraction) and helps nerves work properly. Normally, most of the body's potassium is inside cells, and only a very small amount is in the blood. Because the amount in the blood is so small, minor changes to potassium levels in the blood can be life-threatening. What are the causes? This condition may be caused by: Antibiotic medicine. Diarrhea or vomiting. Taking too much of a medicine that helps you have a bowel movement (laxative) can cause diarrhea and lead to hypokalemia. Chronic kidney disease (CKD). Medicines that help the body get rid of excess fluid (diuretics). Eating disorders, such as anorexia or bulimia. Low magnesium levels in the body. Sweating a lot. What are the signs or symptoms? Symptoms of this condition include: Weakness. Constipation. Fatigue. Muscle cramps. Mental confusion. Skipped heartbeats or irregular heartbeat (palpitations). Tingling or numbness. How is this diagnosed? This condition is diagnosed with a blood test. How is this treated? This condition may be treated by: Taking potassium supplements. Adjusting the medicines that you take. Eating more foods that contain a lot of potassium. If your potassium level is very low, you may need to get potassium through an IV and be monitored in the hospital. Follow these instructions at home: Eating and drinking  Eat a healthy diet. A healthy diet includes fresh fruits and vegetables,  whole grains, healthy fats, and lean proteins. If told, eat more foods that contain a lot of potassium. These include: Nuts, such as peanuts and pistachios. Seeds, such as sunflower seeds and pumpkin seeds. Peas, lentils, and lima beans. Whole grain and bran cereals and breads. Fresh fruits and vegetables, such as apricots, avocado, bananas, cantaloupe, kiwi, oranges, tomatoes, asparagus, and potatoes. Juices, such as orange, tomato, and prune. Lean meats, including fish. Milk and milk products, such as yogurt. General instructions Take over-the-counter and prescription medicines only as told by your health care provider. This includes vitamins, natural food products, and supplements. Keep all follow-up visits. This is important. Contact a health care provider if: You have weakness that gets worse. You feel your heart pounding or racing. You vomit. You have diarrhea. You have diabetes and you have trouble keeping your blood sugar in your target range. Get help right away if: You have chest pain. You have shortness of breath. You have vomiting or diarrhea that lasts for more than 2 days. You faint. These symptoms may be an emergency. Get help right away. Call 911. Do not wait to see if the symptoms will go away. Do not drive yourself to the  hospital. Summary Hypokalemia means that the amount of potassium in the blood is lower than normal. This condition is diagnosed with a blood test. Hypokalemia may be treated by taking potassium supplements, adjusting the medicines that you take, or eating more foods that are high in potassium. If your potassium level is very low, you may need to get potassium through an IV and be monitored in the hospital. This information is not intended to replace advice given to you by your health care provider. Make sure you discuss any questions you have with your health care provider. Document Revised: 11/12/2020 Document Reviewed: 11/12/2020 Elsevier  Patient Education  [place].  Shoulder Range of Motion Exercises Shoulder range of motion (ROM) exercises are done to keep the shoulder moving freely or to increase movement. They are often recommended for people who have shoulder pain or stiffness or who are recovering from a shoulder surgery. Phase 1 exercise When you are able, do this exercise 1-2 times per day for 30-60 seconds in each direction, or as directed by your health care provider. Pendulum exercise     To do this exercise while sitting: Sit in a chair or at the edge of your bed with your feet flat on the floor. Let your affected arm hang down in front of you over the edge of the bed or chair. Relax your shoulder, arm, and hand. Rock your body so your arm gently swings in small circles. You can also use your unaffected arm to start the motion. Repeat changing the direction of the circles, swinging your arm left and right, and swinging your arm forward and back. To do this exercise while standing: Stand next to a sturdy chair or table, and hold on to it with your hand on your unaffected side. Bend forward at the waist. Bend your knees slightly. Relax your shoulder, arm, and hand. While keeping your shoulder relaxed, use body motion to swing your arm in small circles. Repeat changing the direction of the circles, swinging your arm left and right, and swinging your arm forward and back. Between exercises, stand up tall and take a short break to relax your lower back.  Phase 2 exercises Do these exercises 1-2 times per day or as told by your health care provider. Hold each stretch for 30 seconds, and repeat 3 times. Do the exercises with one or both arms as instructed by your health care provider. For these exercises, sit at a table with your hand and arm supported by the table. A chair that slides easily or has wheels can be helpful. External rotation  Turn your chair so that your affected side is nearest to the  table. Place your forearm on the table to your side. Bend your elbow in about a 90-degree angle (right angle) at the elbow, and place your hand palm-down on the table. Your elbow should be about 6 inches (15 cm) away from your side. Keeping your arm on the table, lean your body forward. Abduction  Turn your chair so that your affected side is nearest to the table. Place your forearm and hand on the table so that your thumb points toward the ceiling and your arm is straight out to your side. Slide your hand out to the side and away from you, using your unaffected arm to do the work. To increase the stretch, you can slide your chair away from the table. Flexion: forward stretch  Sit facing the table. Place your hand and elbow on the table  in front of you. Slide your hand forward and away from you, using your unaffected arm to do the work. To increase the stretch, you can slide your chair backward. Phase 3 exercises Do these exercises 1-2 times per day or as told by your health care provider. Hold each stretch for 30 seconds, and repeat 3 times. Do the exercises with one or both arms as instructed by your health care provider. You will need a cane, a piece of PVC pipe, or a sturdy wooden dowel for the wand exercises. Cross-body stretch: posterior capsule stretch  Lift your arm straight out in front of you. Bend your arm in a 90-degree angle (right angle) at the elbow so your forearm moves across your body. Use your other arm to gently pull the elbow across your body, toward your other shoulder. Wall climbs  Stand with your affected arm extended out to the side with your hand resting on a door frame. Slide your hand slowly up the door frame. To increase the stretch, step through the door frame. Keep your body upright and do not lean. Flexion     To do this exercise while standing: Hold the wand with both of your hands, palms down. Using the other arm to help, lift your arms up and over  your head, if able. Push upward with your other arm to gently increase the stretch. To do this exercise while lying down: Lie on your back with your elbows resting on the floor and the wand in both your hands. Your hands will be palm down, or pointing toward your feet. Lift your hands toward the ceiling, using your unaffected arm to help if needed. Bring your arms overhead as able, using your unaffected arm to help if needed.  Internal rotation  Stand while holding the wand behind you with both hands. Your unaffected arm should be extended above your head with the arm of the affected side extended behind you at the level of your waist. The wand should be pointing straight up and down as you hold it. Slowly pull the wand up behind your back by straightening the elbow of your unaffected arm and bending the elbow of your affected arm. External rotation  Lie on your back with your affected upper arm supported on a small pillow or rolled towel. When you first do this exercise, keep your upper arm close to your body. Over time, bring your arm up to a 90-degree angle (right angle) out to the side. Hold the wand across your stomach and with both hands palm up. Your elbow on your affected side should be bent at a 90-degree angle. Use your unaffected side to help push your forearm away from you and toward the floor. Keep your elbow on your affected side bent at a 90-degree angle. Contact a health care provider if you have: New or increasing pain. New numbness, tingling, weakness, or discoloration in your arm or hand. This information is not intended to replace advice given to you by your health care provider. Make sure you discuss any questions you have with your health care provider. Document Revised: 02/23/2021 Document Reviewed: 11/13/2020 Elsevier Patient Education  [place].  Shoulder Exercises Ask your health care provider which exercises are safe for you. Do exercises exactly as told  by your health care provider and adjust them as directed. It is normal to feel mild stretching, pulling, tightness, or discomfort as you do these exercises. Stop right away if you feel sudden pain or  your pain gets worse. Do not begin these exercises until told by your health care provider. Stretching exercises External rotation and abduction This exercise is sometimes called corner stretch. This exercise rotates your arm outward (external rotation) and moves your arm out from your body (abduction). Stand in a doorway with one of your feet slightly in front of the other. This is called a staggered stance. If you cannot reach your forearms to the door frame, stand facing a corner of a room. Choose one of the following positions as told by your health care provider: Place your hands and forearms on the door frame above your head. Place your hands and forearms on the door frame at the height of your head. Place your hands on the door frame at the height of your elbows. Slowly move your weight onto your front foot until you feel a stretch across your chest and in the front of your shoulders. Keep your head and chest upright and keep your abdominal muscles tight. Hold for __________ seconds. To release the stretch, shift your weight to your back foot. Repeat __________ times. Complete this exercise __________ times a day. Extension, standing Stand and hold a broomstick, a cane, or a similar object behind your back. Your hands should be a little wider than shoulder width apart. Your palms should face away from your back. Keeping your elbows straight and your shoulder muscles relaxed, move the stick away from your body until you feel a stretch in your shoulders (extension). Avoid shrugging your shoulders while you move the stick. Keep your shoulder blades tucked down toward the middle of your back. Hold for __________ seconds. Slowly return to the starting position. Repeat __________ times. Complete  this exercise __________ times a day. Range-of-motion exercises Pendulum  Stand near a wall or a surface that you can hold onto for balance. Bend at the waist and let your left / right arm hang straight down. Use your other arm to support you. Keep your back straight and do not lock your knees. Relax your left / right arm and shoulder muscles, and move your hips and your trunk so your left / right arm swings freely. Your arm should swing because of the motion of your body, not because you are using your arm or shoulder muscles. Keep moving your hips and trunk so your arm swings in the following directions, as told by your health care provider: Side to side. Forward and backward. In clockwise and counterclockwise circles. Continue each motion for __________ seconds, or for as long as told by your health care provider. Slowly return to the starting position. Repeat __________ times. Complete this exercise __________ times a day. Shoulder flexion, standing  Stand and hold a broomstick, a cane, or a similar object. Place your hands a little more than shoulder width apart on the object. Your left / right hand should be palm up, and your other hand should be palm down. Keep your elbow straight and your shoulder muscles relaxed. Push the stick up with your healthy arm to raise your left / right arm in front of your body, and then over your head until you feel a stretch in your shoulder (flexion). Avoid shrugging your shoulder while you raise your arm. Keep your shoulder blade tucked down toward the middle of your back. Hold for __________ seconds. Slowly return to the starting position. Repeat __________ times. Complete this exercise __________ times a day. Shoulder abduction, standing Stand and hold a broomstick, a cane, or a similar  object. Place your hands a little more than shoulder width apart on the object. Your left / right hand should be palm up, and your other hand should be palm down. Keep  your elbow straight and your shoulder muscles relaxed. Push the object across your body toward your left / right side. Raise your left / right arm to the side of your body (abduction) until you feel a stretch in your shoulder. Do not raise your arm above shoulder height unless your health care provider tells you to do that. If directed, raise your arm over your head. Avoid shrugging your shoulder while you raise your arm. Keep your shoulder blade tucked down toward the middle of your back. Hold for __________ seconds. Slowly return to the starting position. Repeat __________ times. Complete this exercise __________ times a day. Internal rotation  Place your left / right hand behind your back, palm up. Use your other hand to dangle an exercise band, a towel, or a similar object over your shoulder. Grasp the band with your left / right hand so you are holding on to both ends. Gently pull up on the band until you feel a stretch in the front of your left / right shoulder. The movement of your arm toward the center of your body is called internal rotation. Avoid shrugging your shoulder while you raise your arm. Keep your shoulder blade tucked down toward the middle of your back. Hold for __________ seconds. Release the stretch by letting go of the band and lowering your hands. Repeat __________ times. Complete this exercise __________ times a day. Strengthening exercises External rotation  Sit in a stable chair without armrests. Secure an exercise band to a stable object at elbow height on your left / right side. Place a soft object, such as a folded towel or a small pillow, between your left / right upper arm and your body to move your elbow about 4 inches (10 cm) away from your side. Hold the end of the exercise band so it is tight and there is no slack. Keeping your elbow pressed against the soft object, slowly move your forearm out, away from your abdomen (external rotation). Keep your body  steady so only your forearm moves. Hold for __________ seconds. Slowly return to the starting position. Repeat __________ times. Complete this exercise __________ times a day. Shoulder abduction  Sit in a stable chair without armrests, or stand up. Hold a __________ weight in your left / right hand, or hold an exercise band with both hands. Start with your arms straight down and your left / right palm facing in, toward your body. Slowly lift your left / right hand out to your side (abduction). Do not lift your hand above shoulder height unless your health care provider tells you that this is safe. Keep your arms straight. Avoid shrugging your shoulder while you do this movement. Keep your shoulder blade tucked down toward the middle of your back. Hold for __________ seconds. Slowly lower your arm, and return to the starting position. Repeat __________ times. Complete this exercise __________ times a day. Shoulder extension Sit in a stable chair without armrests, or stand up. Secure an exercise band to a stable object in front of you so it is at shoulder height. Hold one end of the exercise band in each hand. Your palms should face each other. Straighten your elbows and lift your hands up to shoulder height. Step back, away from the secured end of the exercise band, until  the band is tight and there is no slack. Squeeze your shoulder blades together as you pull your hands down to the sides of your thighs (extension). Stop when your hands are straight down by your sides. Do not let your hands go behind your body. Hold for __________ seconds. Slowly return to the starting position. Repeat __________ times. Complete this exercise __________ times a day. Shoulder row Sit in a stable chair without armrests, or stand up. Secure an exercise band to a stable object in front of you so it is at waist height. Hold one end of the exercise band in each hand. Position your palms so that your thumbs are  facing the ceiling (neutral position). Bend each of your elbows to a 90-degree angle (right angle) and keep your upper arms at your sides. Step back until the band is tight and there is no slack. Slowly pull your elbows back behind you. Hold for __________ seconds. Slowly return to the starting position. Repeat __________ times. Complete this exercise __________ times a day. Shoulder press-ups  Sit in a stable chair that has armrests. Sit upright, with your feet flat on the floor. Put your hands on the armrests so your elbows are bent and your fingers are pointing forward. Your hands should be about even with the sides of your body. Push down on the armrests and use your arms to lift yourself off the chair. Straighten your elbows and lift yourself up as much as you comfortably can. Move your shoulder blades down, and avoid letting your shoulders move up toward your ears. Keep your feet on the ground. As you get stronger, your feet should support less of your body weight as you lift yourself up. Hold for __________ seconds. Slowly lower yourself back into the chair. Repeat __________ times. Complete this exercise __________ times a day. Wall push-ups  Stand so you are facing a stable wall. Your feet should be about one arm-length away from the wall. Lean forward and place your palms on the wall at shoulder height. Keep your feet flat on the floor as you bend your elbows and lean forward toward the wall. Hold for __________ seconds. Straighten your elbows to push yourself back to the starting position. Repeat __________ times. Complete this exercise __________ times a day. This information is not intended to replace advice given to you by your health care provider. Make sure you discuss any questions you have with your health care provider. Document Revised: 02/23/2021 Document Reviewed: 03/30/2018 Elsevier Patient Education  [place].  Neck Exercises Ask your health care  provider which exercises are safe for you. Do exercises exactly as told by your health care provider and adjust them as directed. It is normal to feel mild stretching, pulling, tightness, or discomfort as you do these exercises. Stop right away if you feel sudden pain or your pain gets worse. Do not begin these exercises until told by your health care provider. Neck exercises can be important for many reasons. They can improve strength and maintain flexibility in your neck, which will help your upper back and prevent neck pain. Stretching exercises Rotation neck stretching  Sit in a chair or stand up. Place your feet flat on the floor, shoulder-width apart. Slowly turn your head (rotate) to the right until a slight stretch is felt. Turn it all the way to the right so you can look over your right shoulder. Do not tilt or tip your head. Hold this position for 10-30 seconds. Slowly turn  your head (rotate) to the left until a slight stretch is felt. Turn it all the way to the left so you can look over your left shoulder. Do not tilt or tip your head. Hold this position for 10-30 seconds. Repeat __________ times. Complete this exercise __________ times a day. Neck retraction  Sit in a sturdy chair or stand up. Look straight ahead. Do not bend your neck. Use your fingers to push your chin backward (retraction). Do not bend your neck for this movement. Continue to face straight ahead. If you are doing the exercise properly, you will feel a slight sensation in your throat and a stretch at the back of your neck. Hold the stretch for 1-2 seconds. Repeat __________ times. Complete this exercise __________ times a day. Strengthening exercises Neck press  Lie on your back on a firm bed or on the floor with a pillow under your head. Use your neck muscles to push your head down on the pillow and straighten your spine. Hold the position as well as you can. Keep your head facing up (in a neutral position) and  your chin tucked. Slowly count to 5 while holding this position. Repeat __________ times. Complete this exercise __________ times a day. Isometrics These are exercises in which you strengthen the muscles in your neck while keeping your neck still (isometrics). Sit in a supportive chair and place your hand on your forehead. Keep your head and face facing straight ahead. Do not flex or extend your neck while doing isometrics. Push forward with your head and neck while pushing back with your hand. Hold for 10 seconds. Do the sequence again, this time putting your hand against the back of your head. Use your head and neck to push backward against the hand pressure. Finally, do the same exercise on either side of your head, pushing sideways against the pressure of your hand. Repeat __________ times. Complete this exercise __________ times a day. Prone head lifts  Lie face-down (prone position), resting on your elbows so that your chest and upper back are raised. Start with your head facing downward, near your chest. Position your chin either on or near your chest. Slowly lift your head upward. Lift until you are looking straight ahead. Then continue lifting your head as far back as you can comfortably stretch. Hold your head up for 5 seconds. Then slowly lower it to your starting position. Repeat __________ times. Complete this exercise __________ times a day. Supine head lifts  Lie on your back (supine position), bending your knees to point to the ceiling and keeping your feet flat on the floor. Lift your head slowly off the floor, raising your chin toward your chest. Hold for 5 seconds. Repeat __________ times. Complete this exercise __________ times a day. Scapular retraction  Stand with your arms at your sides. Look straight ahead. Slowly pull both shoulders (scapulae) backward and downward (retraction) until you feel a stretch between your shoulder blades in your upper back. Hold for  10-30 seconds. Relax and repeat. Repeat __________ times. Complete this exercise __________ times a day. Contact a health care provider if: Your neck pain or discomfort gets worse when you do an exercise. Your neck pain or discomfort does not improve within 2 hours after you exercise. If you have any of these problems, stop exercising right away. Do not do the exercises again unless your health care provider says that you can. Get help right away if: You develop sudden, severe neck pain. If this  happens, stop exercising right away. Do not do the exercises again unless your health care provider says that you can. This information is not intended to replace advice given to you by your health care provider. Make sure you discuss any questions you have with your health care provider. Document Revised: 08/25/2020 Document Reviewed: 08/25/2020 Elsevier Patient Education  [place].

## 2021-07-31 LAB — BASIC METABOLIC PANEL
BUN/Creatinine Ratio: 11 — ABNORMAL LOW (ref 12–28)
BUN: 8 mg/dL (ref 8–27)
CO2: 23 mmol/L (ref 20–29)
Calcium: 9.4 mg/dL (ref 8.7–10.3)
Chloride: 102 mmol/L (ref 96–106)
Creatinine, Ser: 0.72 mg/dL (ref 0.57–1.00)
Glucose: 120 mg/dL — ABNORMAL HIGH (ref 70–99)
Potassium: 3.6 mmol/L (ref 3.5–5.2)
Sodium: 142 mmol/L (ref 134–144)
eGFR: 92 mL/min/{1.73_m2} (ref >59)

## 2021-07-31 LAB — MAGNESIUM: Magnesium: 2.1 mg/dL (ref 1.6–2.3)

## 2021-08-02 ENCOUNTER — Other Ambulatory Visit: Payer: Self-pay | Admitting: Internal Medicine

## 2021-08-06 ENCOUNTER — Other Ambulatory Visit: Payer: Medicare Other

## 2021-08-11 IMAGING — CT CT ABD-PELV W/ CM
2 of 5 series · 17 of 46 positions shown, 19 images · IV contrast (omnipaque)
Comparison: CT 04/26/2013

CLINICAL DATA: Right upper quadrant pain for 2-3 weeks. Nausea.
History of gastric bypass surgery and cholecystectomy.

EXAM:
CT ABDOMEN AND PELVIS WITH CONTRAST
TECHNIQUE: Multidetector CT imaging of the abdomen and pelvis was performed
using the standard protocol following bolus administration of
intravenous contrast.
CONTRAST:  100mL OMNIPAQUE IOHEXOL 300 MG/ML  SOLN

[Series 2: abd pelvis 5.00 · axial · 0.87mm/px · z∈[-1478,-1103]mm · 14 of 85 slices shown, 16 images]
[im 5/85  soft-tissue]
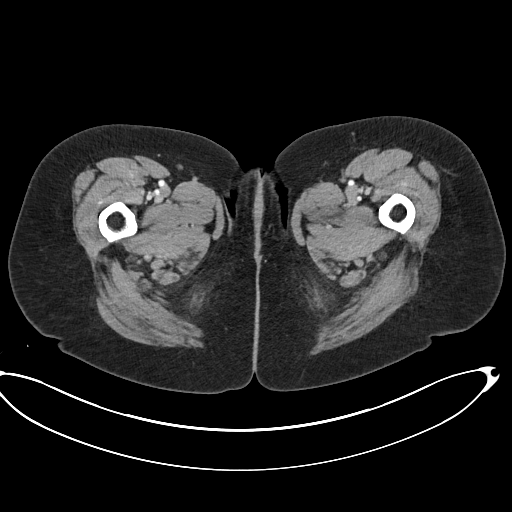
[im 5/85  bone]
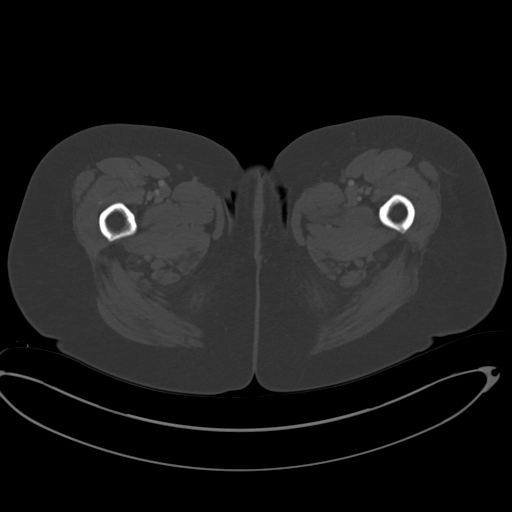
[im 10/85  soft-tissue]
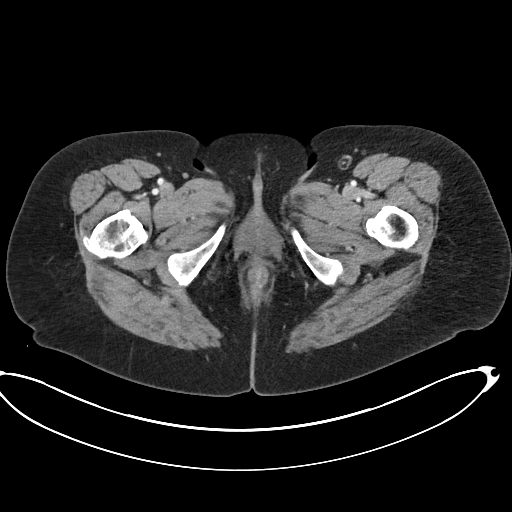
[im 19/85  soft-tissue]
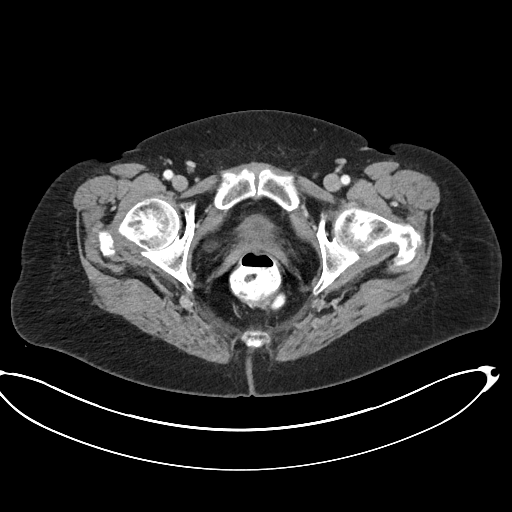
[im 24/85  soft-tissue]
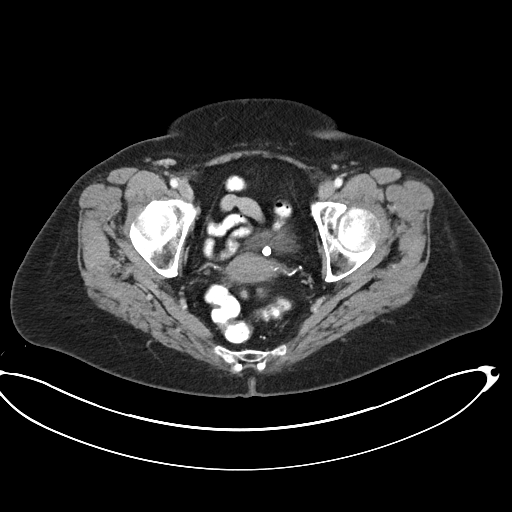
[im 29/85  soft-tissue]
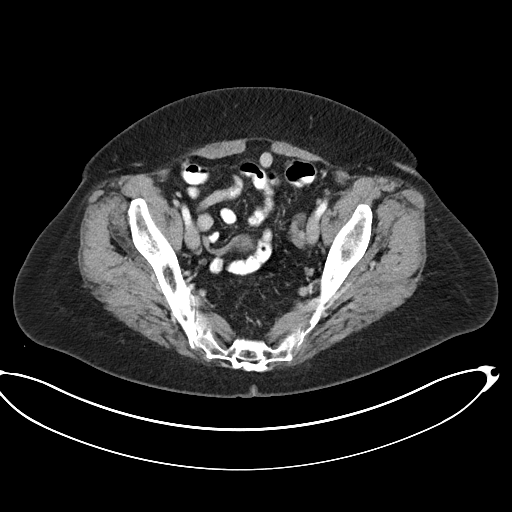
[im 33/85  soft-tissue]
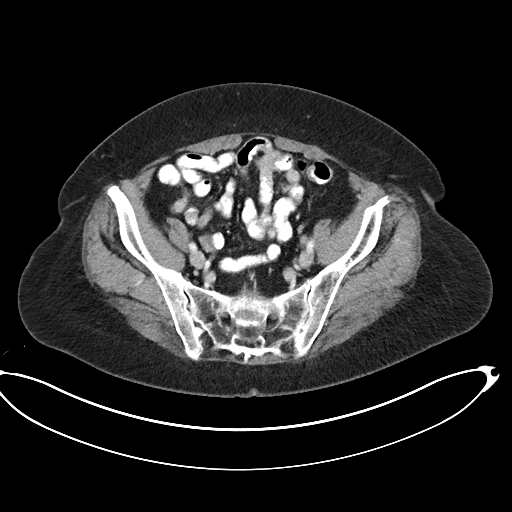
[im 38/85  soft-tissue]
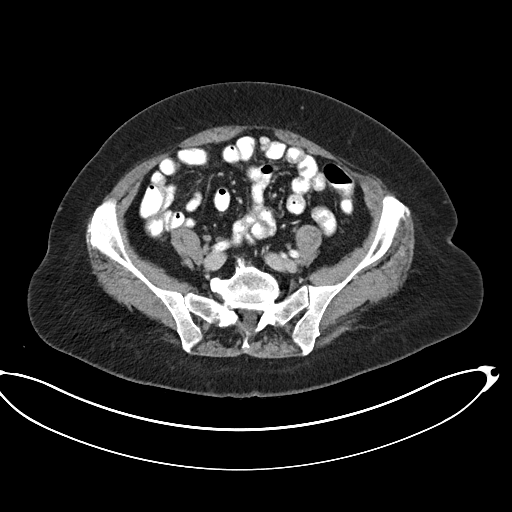
[im 47/85  soft-tissue]
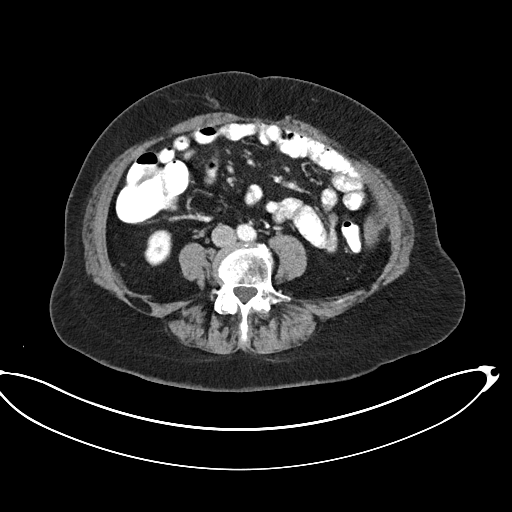
[im 52/85  soft-tissue]
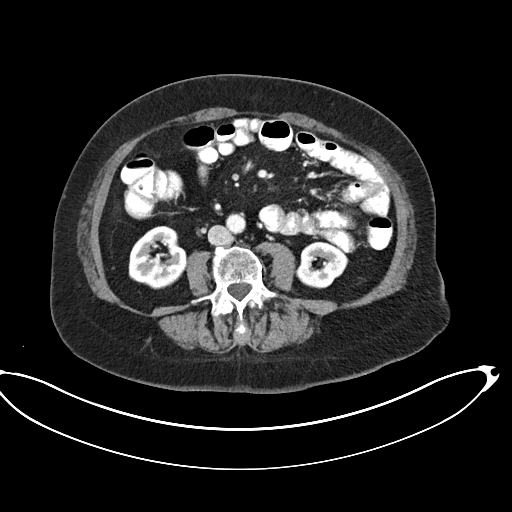
[im 52/85  bone]
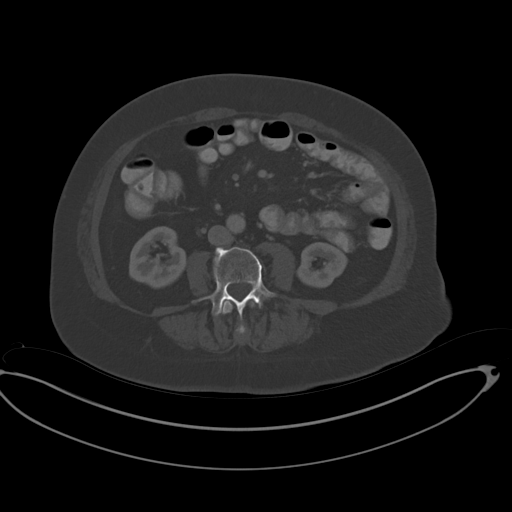
[im 57/85  soft-tissue]
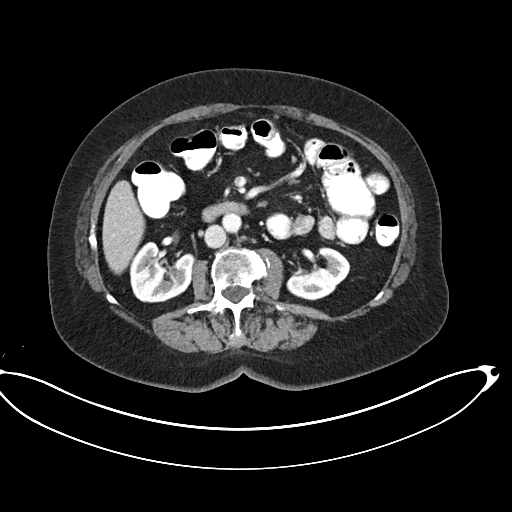
[im 61/85  soft-tissue]
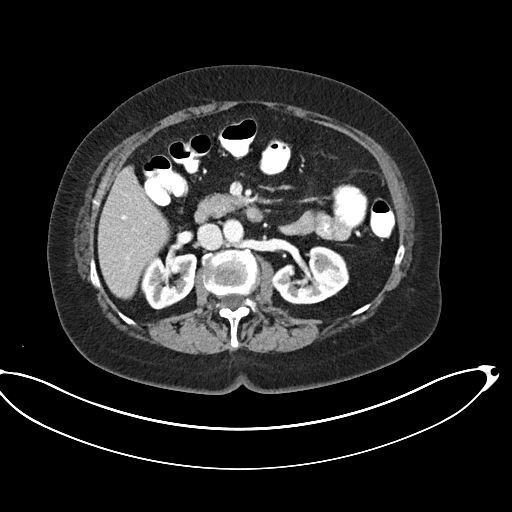
[im 66/85  soft-tissue]
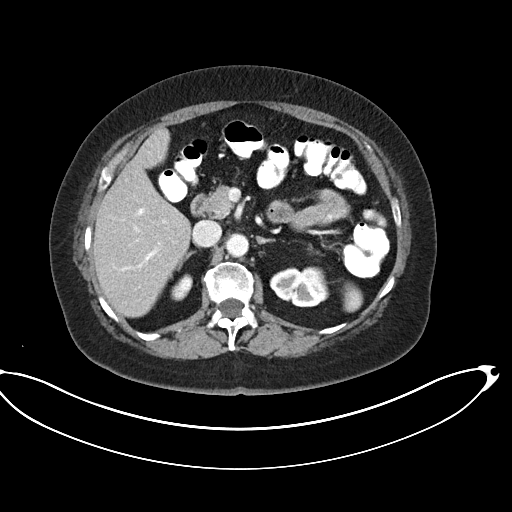
[im 75/85  soft-tissue]
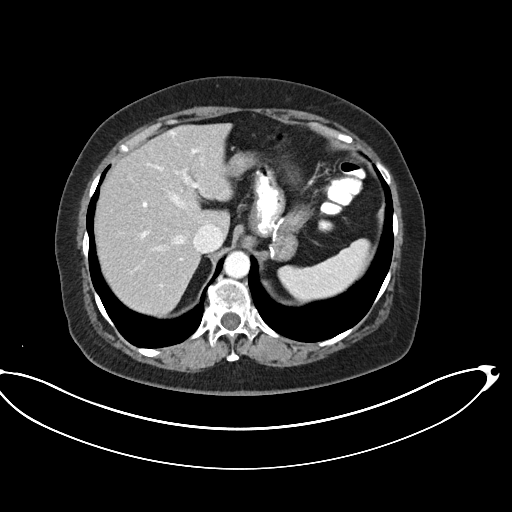
[im 80/85  soft-tissue]
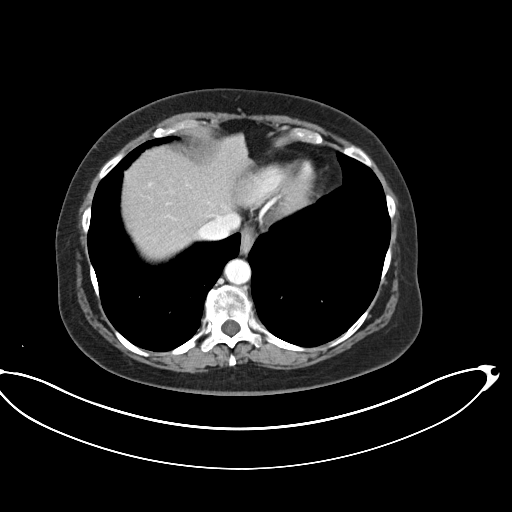

[Series 4: coronals abd pelvis 2.00 cor · coronal · 0.83mm/px · 3 of 148 slices shown]
[im 50/148  soft-tissue]
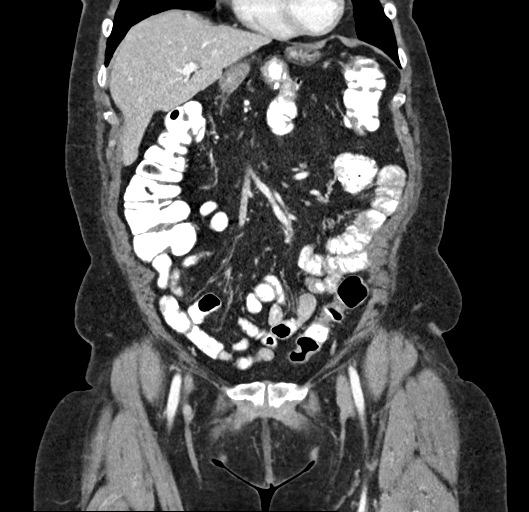
[im 66/148  soft-tissue]
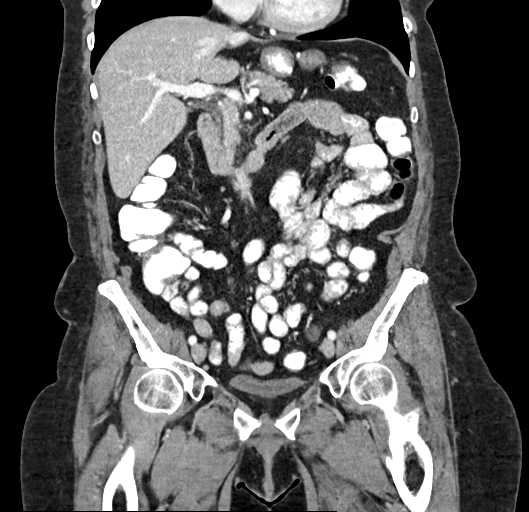
[im 82/148  soft-tissue]
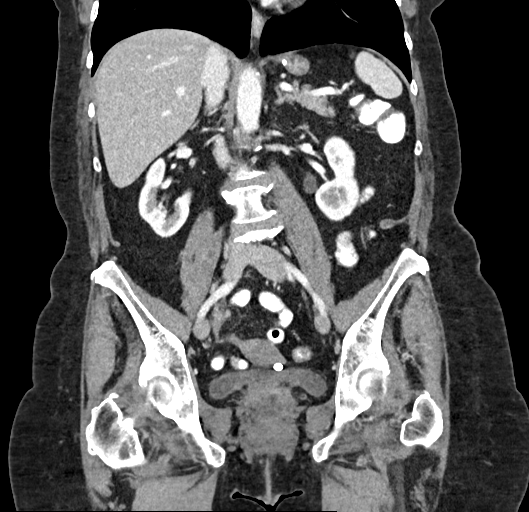

[17 of 46 positions shown; findings below may reference images not displayed]

FINDINGS: Lower chest: Included lung bases are clear.  Heart size is normal.

Hepatobiliary: No focal liver abnormality is seen. Status post
cholecystectomy. No biliary dilatation.

Pancreas: Unremarkable. No pancreatic ductal dilatation or
surrounding inflammatory changes.

Spleen: Normal in size without focal abnormality.

Adrenals/Urinary Tract: Unremarkable adrenal glands. Kidneys enhance
symmetrically without focal lesion, stone, or hydronephrosis.
Ureters are nondilated. Urinary bladder appears unremarkable for the
degree of distension.

Stomach/Bowel: Postsurgical changes from gastric bypass. Oral
contrast is present throughout the bowel. No evidence of
obstruction. No focal bowel wall thickening or inflammatory changes.
Normal retrocecal appendix (series 4, image 78).

Vascular/Lymphatic: Scattered aortoiliac atherosclerotic
calcifications without aneurysm. Retroaortic left renal vein. No
abdominopelvic lymphadenopathy.

Reproductive: Uterus and bilateral adnexa are unremarkable.

Other: No free fluid. No abdominopelvic fluid collection. No
pneumoperitoneum. No abdominal wall hernia.

Musculoskeletal: No acute or significant osseous findings.
IMPRESSION: 1. No acute abdominopelvic findings.
2. Postsurgical changes from gastric bypass and cholecystectomy.
3. Aortic atherosclerosis ([id number]).

## 2021-08-16 ENCOUNTER — Other Ambulatory Visit: Payer: Self-pay | Admitting: Internal Medicine

## 2021-09-02 ENCOUNTER — Other Ambulatory Visit: Payer: Self-pay | Admitting: Internal Medicine

## 2021-09-13 ENCOUNTER — Ambulatory Visit (INDEPENDENT_AMBULATORY_CARE_PROVIDER_SITE_OTHER): Payer: Medicare Other | Admitting: Internal Medicine

## 2021-09-13 ENCOUNTER — Encounter: Payer: Self-pay | Admitting: Internal Medicine

## 2021-09-13 DIAGNOSIS — F4325 Adjustment disorder with mixed disturbance of emotions and conduct: Secondary | ICD-10-CM | POA: Diagnosis not present

## 2021-09-13 DIAGNOSIS — I700 Atherosclerosis of aorta: Secondary | ICD-10-CM

## 2021-09-13 DIAGNOSIS — M542 Cervicalgia: Secondary | ICD-10-CM

## 2021-09-13 MED ORDER — TRAMADOL HCL 50 MG PO TABS: 50.0000 mg | ORAL_TABLET | Freq: Every day | ORAL | 0 refills | Status: DC | PRN

## 2021-09-13 MED ORDER — FUROSEMIDE 20 MG PO TABS: 20.0000 mg | ORAL_TABLET | ORAL | 1 refills | Status: DC | PRN

## 2021-09-13 NOTE — Progress Notes (Unsigned)
Subjective:  Patient ID: Sherri Lee, female    DOB: 1956-03-03  Age: 66 y.o. MRN: [id number]  CC: There were no encounter diagnoses.   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  Currently in [facility] (3rd rehab unit) , has not been able to walk independently, has had several falls , has required full assistance and in doing so the patient has injured her shoulder and upper back from trying to help her up.  She was seen by TMS in mid May and prescribed flexeril and tramadol ,  and made referral to Orthopedics.. has not scheduled an Orthopedic evaluation yet due to mother's issues.   Has been taking tylenol and diclofenac cream since she ran out of tramadol. The pain persists in her posterior neck  and across her shoulders, .  She has had rotator cuff surgery on the left,  and ACDF  surgery in 2011   Refill history confirmed via [place] Controlled Substance databas, accessed by me today.. she is taking ambien qhs and lorazepam  2-3 times times  daily   prescribed by her psychiatrist.   Edema:  has reduced amlodipine to 7.5 mg ( 5 + 2.5)    Outpatient Medications Prior to Visit  Medication Sig Dispense Refill   acyclovir (ZOVIRAX) 400 MG tablet Take 1 tablet (400 mg total) by mouth 5 (five) times daily. 35 tablet 3   albuterol (VENTOLIN HFA) 108 (90 Base) MCG/ACT inhaler TAKE 2 PUFFS BY MOUTH EVERY 6 HOURS AS NEEDED FOR WHEEZE OR SHORTNESS OF BREATH 8.5 each 2   amLODipine (NORVASC) 10 MG tablet Take 1 tablet (10 mg total) by mouth daily. 90 tablet 1   atorvastatin (LIPITOR) 20 MG tablet TAKE 1 TABLET BY MOUTH EVERY DAY 90 tablet 3   benzonatate (TESSALON) 200 MG capsule Take 1 capsule (200 mg total) by mouth 3 (three) times daily as needed for cough. 1-2 capsules up to  twice daily as needed for cough (Patient not taking: Reported on 07/30/2021) 60 capsule 0   butalbital-acetaminophen-caffeine (FIORICET) 50-325-40 MG tablet TAKE 1-2 TABLETS BY MOUTH EVERY 6 (SIX) HOURS AS NEEDED FOR HEADACHE. 120 tablet 2   calcium carbonate (OS-CAL) 600 MG TABS Take 600 mg by mouth 2 (two) times daily with a meal.     chlorpheniramine-HYDROcodone (TUSSIONEX PENNKINETIC ER) 10-8 MG/5ML Take 5 mLs by mouth at bedtime as needed. (Patient not taking: Reported on 07/30/2021) 180 mL 0   conjugated estrogens (PREMARIN) vaginal cream Place 1 Applicatorful vaginally daily. FOR 2 WEEKS,  THEN TWICE WEEKLY THEREAFTER (Patient not taking: Reported on 07/30/2021) 42.5 g 12   cyclobenzaprine (FLEXERIL) 5 MG tablet Take 1 tablet (5 mg total) by mouth at bedtime as needed for muscle spasms. 30 tablet 2   diclofenac Sodium (VOLTAREN) 1 % GEL Apply 2 g topically 4 (four) times daily. 100 g G   doxycycline (VIBRA-TABS) 100 MG tablet Take 1 tablet (100 mg total) by mouth 2 (two) times daily. (Patient not taking: Reported on 07/30/2021) 28 tablet 0   escitalopram (LEXAPRO) 20 MG tablet Take 20 mg by mouth daily.      fluticasone (FLONASE) 50 MCG/ACT nasal spray PLACE 2 SPRAYS INTO BOTH NOSTRILS DAILY AS NEEDED FOR ALLERGIES  OR RHINITIS. AFTER NASAL SALINE 48 mL 0   gabapentin (NEURONTIN) 100 MG capsule TAKE 1 CAPSULE (100 MG TOTAL) BY MOUTH THREE TIMES DAILY. 270 capsule 3   latanoprost (XALATAN) 0.005 % ophthalmic solution 1 drop at bedtime.     LORazepam (ATIVAN) 1 MG tablet TAKE 1 TABLET BY MOUTH TWICE A DAY AS DIRECTED FOR ANXIETY/PANIC  2   metoprolol succinate (TOPROL-XL) 50 MG 24 hr tablet TAKE 0.5 TABLETS (25 MG TOTAL) BY MOUTH DAILY. TAKE WITH OR IMMEDIATELY FOLLOWING A MEAL. 45 tablet 3   Multiple Vitamin (MULTIVITAMIN) tablet Take 1 tablet by mouth daily. Bariatric Vitamin     pantoprazole (PROTONIX) 40 MG tablet TAKE 1 TABLET BY MOUTH EVERY DAY 90 tablet 3   potassium chloride SA (KLOR-CON M)  20 MEQ tablet Take 1 tablet (20 mEq total) by mouth 2 (two) times daily. 10 tablet 0   predniSONE (DELTASONE) 10 MG tablet 6 tablets daily for 3 days, then reduce by 1 tablet daily until gone 33 tablet 0   VITAMIN D PO Take by mouth.     zolpidem (AMBIEN) 10 MG tablet Take 10 mg by mouth at bedtime as needed for sleep.     No facility-administered medications prior to visit.    Review of Systems;  Patient denies headache, fevers, malaise, unintentional weight loss, skin rash, eye pain, sinus congestion and sinus pain, sore throat, dysphagia,  hemoptysis , cough, dyspnea, wheezing, chest pain, palpitations, orthopnea, edema, abdominal pain, nausea, melena, diarrhea, constipation, flank pain, dysuria, hematuria, urinary  Frequency, nocturia, numbness, tingling, seizures,  Focal weakness, Loss of consciousness,  Tremor, insomnia, depression, anxiety, and suicidal ideation.      Objective:  There were no vitals taken for this visit.  BP Readings from Last 3 Encounters:  07/30/21 132/80  07/06/21 (!) 138/102  05/14/21 132/82    Wt Readings from Last 3 Encounters:  07/30/21 170 lb 12.8 oz (77.5 kg)  07/06/21 172 lb 9.6 oz (78.3 kg)  06/14/21 172 lb (78 kg)    General appearance: alert, cooperative and appears stated age Ears: normal TM's and external ear canals both ears Throat: lips, mucosa, and tongue normal; teeth and gums normal Neck: no adenopathy, no carotid bruit, supple, symmetrical, trachea midline and thyroid not enlarged, symmetric, no tenderness/mass/nodules Back: symmetric, no curvature. ROM normal. No CVA tenderness. Lungs: clear to auscultation bilaterally Heart: regular rate and rhythm, S1, S2 normal, no murmur, click, rub or gallop Abdomen: soft, non-tender; bowel sounds normal; no masses,  no organomegaly Pulses: 2+ and symmetric Skin: Skin color, texture, turgor normal. No rashes or lesions Lymph nodes: Cervical, supraclavicular, and axillary nodes normal.  Lab  Results  Component Value Date   HGBA1C 6.4 09/01/2020   HGBA1C 5.9 05/28/2020   HGBA1C 5.7 07/29/2019    Lab Results  Component Value Date   CREATININE 0.72 07/30/2021   CREATININE 0.69 07/06/2021   CREATININE 0.65 01/29/2021    Lab Results  Component Value Date   WBC 6.4 07/06/2021   HGB 11.3 (L) 07/06/2021   HCT 35.0 (L) 07/06/2021   PLT 199.0 07/06/2021   GLUCOSE 120 (H) 07/30/2021   CHOL 174 09/01/2020   TRIG 55.0 09/01/2020   HDL 84.90 09/01/2020   LDLDIRECT 99.0 09/04/2015   LDLCALC 79 09/01/2020   ALT 25 07/06/2021   AST 20 07/06/2021   NA 142 07/30/2021   K 3.6 07/30/2021   CL 102 07/30/2021   CREATININE 0.72 07/30/2021   BUN 8 07/30/2021  CO2 23 07/30/2021   TSH 0.59 07/29/2019   HGBA1C 6.4 09/01/2020   MICROALBUR 0.6 09/04/2015    CT HEAD WO CONTRAST ( )  Result Date: 01/29/2021 CLINICAL DATA:  Headache, new or worsening (Age >= 50y) EXAM: CT HEAD WITHOUT CONTRAST TECHNIQUE: Contiguous axial images were obtained from the base of the skull through the vertex without intravenous contrast. COMPARISON:  CT head 10/21/2018 FINDINGS: Brain: No evidence of acute infarction, hemorrhage, hydrocephalus, extra-axial collection or mass lesion/mass effect. Vascular: No hyperdense vessel identified. Intracranial atherosclerosis. Skull: No acute fracture. Sinuses/Orbits: Clear sinuses.  Unremarkable orbits. Other: No mastoid effusions. IMPRESSION: No evidence of acute intracranial abnormality. Electronically Signed   By: Feliberto Harts M.D.   On: 01/29/2021 17:52    Assessment & Plan:   Problem List Items Addressed This Visit   None   I spent a total of   minutes with this patient in a face to face visit on the date of this encounter reviewing the last office visit with me on        ,  most recent with patient's cardiologist in    ,  patient'ss diet and eating habits, home blood pressure readings ,  most recent imaging study ,   and post visit ordering of testing  and therapeutics.    Follow-up: No follow-ups on file.   [name], MD

## 2021-09-13 NOTE — Patient Instructions (Addendum)
Your neck and shoulder pain is likely musculoskeletal due to the physical work you have been doing.  Continue tylenol and diclofenac gel and salon pas with lidocaine  You may use one tramadol daily.  NOT MORE because of your concurrent use of lorazepam and ambien  Please schedule the orthopedics evaluation BEFORE YOU RUN OUT     Reduce  amlodipine dose to 5 mg daily and continue metoprolol  Goal BP is 140/90 or less.    Use the furosemide sparingly to manage fluid retention

## 2021-09-14 ENCOUNTER — Encounter: Payer: Self-pay | Admitting: Internal Medicine

## 2021-09-14 NOTE — Assessment & Plan Note (Signed)
She endorses increased symptoms of depression and anxiety due to increased responsibilities caring for parents who live independent of each other.  Her affect is not depressed ; however, and she is not suicidal.  Given her ongoing relationship with Dr Janeece Riggers and her maximal dose of lexapro 20 mg daily and concurrent use of lorazepam, I have deferred to Dr Janeece Riggers.

## 2021-09-14 NOTE — Assessment & Plan Note (Signed)
Acute on chronic aggravated by increased physical activities in caring for mother.  Tramadol refilled for 30 days,  Once daily use.  Follow up with Orthopedics continue MR, tylenol , topical NSAID and lidocaine OTC patches. Oral use of NSAIDs  are C/I due to history of gastric bypass.

## 2021-09-14 NOTE — Assessment & Plan Note (Signed)
Reviewed findings of prior CT scan today..  Patient is tolerating high potency statin therapy  

## 2021-09-29 ENCOUNTER — Other Ambulatory Visit (HOSPITAL_COMMUNITY)
Admission: RE | Admit: 2021-09-29 | Discharge: 2021-09-29 | Disposition: A | Discharge status: Home / Self Care | Payer: Medicare Other | Source: Ambulatory Visit | Attending: Internal Medicine | Admitting: Internal Medicine

## 2021-09-29 ENCOUNTER — Ambulatory Visit (INDEPENDENT_AMBULATORY_CARE_PROVIDER_SITE_OTHER): Payer: Medicare Other | Admitting: Internal Medicine

## 2021-09-29 ENCOUNTER — Encounter: Payer: Self-pay | Admitting: Internal Medicine

## 2021-09-29 VITALS — BP 120/62 | HR 91 | Temp 98.3°F | Ht 65.0 in | Wt 169.2 lb

## 2021-09-29 DIAGNOSIS — N760 Acute vaginitis: Secondary | ICD-10-CM | POA: Insufficient documentation

## 2021-09-29 DIAGNOSIS — N3000 Acute cystitis without hematuria: Secondary | ICD-10-CM | POA: Diagnosis not present

## 2021-09-29 NOTE — Patient Instructions (Signed)
[employer] cranberry/D [employer]  Urinary Tract Infection, Adult  A urinary tract infection (UTI) is an infection of any part of the urinary tract. The urinary tract includes the kidneys, ureters, bladder, and urethra. These organs make, store, and get rid of urine in the body. An upper UTI affects the ureters and kidneys. A lower UTI affects the bladder and urethra. What are the causes? Most urinary tract infections are caused by bacteria in your genital area around your urethra, where urine leaves your body. These bacteria grow and cause inflammation of your urinary tract. What increases the risk? You are more likely to develop this condition if: You have a urinary catheter that stays in place. You are not able to control when you urinate or have a bowel movement (incontinence). You are female and you: Use a spermicide or diaphragm for birth control. Have low estrogen levels. Are pregnant. You have certain genes that increase your risk. You are sexually active. You take antibiotic medicines. You have a condition that causes your flow of urine to slow down, such as: An enlarged prostate, if you are female. Blockage in your urethra. A kidney stone. A nerve condition that affects your bladder control (neurogenic bladder). Not getting enough to drink, or not urinating often. You have certain medical conditions, such as: Diabetes. A weak disease-fighting system (immunesystem). Sickle cell disease. Gout. Spinal cord injury. What are the signs or symptoms? Symptoms of this condition include: Needing to urinate right away (urgency). Frequent urination. This may include small amounts of urine each time you urinate. Pain or burning with urination. Blood in the urine. Urine that smells bad or unusual. Trouble urinating. Cloudy urine. Vaginal discharge, if you are female. Pain in the abdomen or the lower back. You may also have: Vomiting or a decreased  appetite. Confusion. Irritability or tiredness. A fever or chills. Diarrhea. The first symptom in older adults may be confusion. In some cases, they may not have any symptoms until the infection has worsened. How is this diagnosed? This condition is diagnosed based on your medical history and a physical exam. You may also have other tests, including: Urine tests. Blood tests. Tests for STIs (sexually transmitted infections). If you have had more than one UTI, a cystoscopy or imaging studies may be done to determine the cause of the infections. How is this treated? Treatment for this condition includes: Antibiotic medicine. Over-the-counter medicines to treat discomfort. Drinking enough water to stay hydrated. If you have frequent infections or have other conditions such as a kidney stone, you may need to see a health care provider who specializes in the urinary tract (urologist). In rare cases, urinary tract infections can cause sepsis. Sepsis is a life-threatening condition that occurs when the body responds to an infection. Sepsis is treated in the hospital with IV antibiotics, fluids, and other medicines. Follow these instructions at home:  Medicines Take over-the-counter and prescription medicines only as told by your health care provider. If you were prescribed an antibiotic medicine, take it as told by your health care provider. Do not stop using the antibiotic even if you start to feel better. General instructions Make sure you: Empty your bladder often and completely. Do not hold urine for long periods of time. Empty your bladder after sex. Wipe from front to back after urinating or having a bowel movement if you are female. Use each tissue only one time when you wipe. Drink enough fluid to keep your urine pale yellow. Keep all follow-up  visits. This is important. Contact a health care provider if: Your symptoms do not get better after 1-2 days. Your symptoms go away and then  return. Get help right away if: You have severe pain in your back or your lower abdomen. You have a fever or chills. You have nausea or vomiting. Summary A urinary tract infection (UTI) is an infection of any part of the urinary tract, which includes the kidneys, ureters, bladder, and urethra. Most urinary tract infections are caused by bacteria in your genital area. Treatment for this condition often includes antibiotic medicines. If you were prescribed an antibiotic medicine, take it as told by your health care provider. Do not stop using the antibiotic even if you start to feel better. Keep all follow-up visits. This is important. This information is not intended to replace advice given to you by your health care provider. Make sure you discuss any questions you have with your health care provider. Document Revised: 10/11/2019 Document Reviewed: 10/11/2019 Elsevier Patient Education  2023 [employer] Inc[employer]  Vaginitis  Vaginitis is a condition in which the vaginal tissue swells and becomes irritated. This condition is most often caused by a change in the normal balance of bacteria and yeast that live in the vagina. This change causes an overgrowth of certain bacteria or yeast, which causes the inflammation. There are different types of vaginitis. What are the causes? The cause of this condition depends on the type of vaginitis. It can be caused by: Bacteria (bacterial vaginosis). Yeast, which is a fungus (candidiasis). A parasite (trichomoniasis vaginitis). A virus (viral vaginitis). Low hormone levels (atrophic vaginitis). Low hormone levels can occur during pregnancy, breastfeeding, or after menopause. Irritants, such as bubble baths, scented tampons, and feminine sprays (allergic vaginitis). Other factors can change the normal balance of the yeast and bacteria that live in the vagina. These include: Antibiotic medicines. Poor hygiene. Diaphragms, vaginal sponges, spermicides, birth  control pills, and intrauterine devices (IUDs). Sex. Infection. Uncontrolled diabetes. A weakened body defense system (immune system). What increases the risk? This condition is more likely to develop in women who: Smoke or are exposed to secondhand smoke. Use vaginal douches, scented tampons, or scented sanitary pads. Wear tight-fitting pants or thong underwear. Use oral birth control pills or an IUD. Have sex without a condom or have multiple partners. Have an STI. Frequently use the spermicide nonoxynol-9. Eat lots of foods high in sugar or who have uncontrolled diabetes. Have low estrogen levels. Have a weakened immune system from an immune disorder or medical treatment. Are pregnant or breastfeeding. What are the signs or symptoms? Symptoms vary depending on the cause of the vaginitis. Common symptoms include: Abnormal vaginal discharge. The discharge is white, gray, or yellow with bacterial vaginosis. The discharge is thick, white, and cheesy with a yeast infection. The discharge is frothy and yellow or greenish with trichomoniasis. A bad vaginal smell. The smell is fishy with bacterial vaginosis. Vaginal itching, pain, or swelling. Pain with sex. Pain or burning when urinating. Sometimes there are no symptoms. How is this diagnosed? This condition is diagnosed based on your symptoms and medical history. A physical exam, including a pelvic exam, will also be done. You may also have other tests, including: Tests to determine the pH level (acidity or alkalinity) of your vagina. A whiff test to assess the odor that results when a sample of your vaginal discharge is mixed with a potassium hydroxide solution. Tests of vaginal fluid. A sample will be examined under a microscope. How  is this treated? Treatment varies depending on the type of vaginitis you have. Your treatment may include: Antibiotic creams or pills to treat bacterial vaginosis and trichomoniasis. Antifungal  medicines, such as vaginal creams or suppositories, to treat a yeast infection. Medicine to ease discomfort if you have viral vaginitis. Your sexual partner should also be treated. Estrogen delivered in a cream, pill, suppository, or vaginal ring to treat atrophic vaginitis. If vaginal dryness occurs, lubricants and moisturizing creams may help. You may need to avoid scented soaps, sprays, or douches. Stopping use of a product that is causing allergic vaginitis and then using a vaginal cream to treat the symptoms. Follow these instructions at home: Lifestyle Keep your genital area clean and dry. Avoid soap, and only rinse the area with water. Do not douche or use tampons until your health care provider says it is okay. Use sanitary pads, if needed. Do not have sex until your health care provider approves. When you can return to sex, practice safe sex and use condoms. Wipe from front to back. This avoids the spread of bacteria from the rectum to the vagina. General instructions Take over-the-counter and prescription medicines only as told by your health care provider. If you were prescribed an antibiotic medicine, take or use it as told by your health care provider. Do not stop taking or using the antibiotic even if you start to feel better. Keep all follow-up visits. This is important. How is this prevented? Use mild, unscented products. Do not use things that can irritate the vagina, such as fabric softeners. Avoid the following products if they are scented: Feminine sprays. Detergents. Tampons. Feminine hygiene products. Soaps or bubble baths. Let air reach your genital area. To do this: Wear cotton underwear to reduce moisture buildup. Avoid wearing underwear while you sleep. Avoid wearing tight pants and underwear or nylons without a cotton panel. Avoid wearing thong underwear. Take off any wet clothing, such as bathing suits, as soon as possible. Practice safe sex and use  condoms. Contact a health care provider if: You have abdominal or pelvic pain. You have a fever or chills. You have symptoms that last for more than 2-3 days. Get help right away if: You have a fever and your symptoms suddenly get worse. Summary Vaginitis is a condition in which the vaginal tissue becomes inflamed.This condition is most often caused by a change in the normal balance of bacteria and yeast that live in the vagina. Treatment varies depending on the type of vaginitis you have. Do not douche, use tampons, or have sex until your health care provider approves. When you can return to sex, practice safe sex and use condoms. This information is not intended to replace advice given to you by your health care provider. Make sure you discuss any questions you have with your health care provider. Document Revised: 08/29/2019 Document Reviewed: 08/29/2019 Elsevier Patient Education  2023 [employer].

## 2021-09-29 NOTE — Progress Notes (Signed)
Chief Complaint  Patient presents with   Urinary Tract Infection    Started about a week ago   F/ u 1. Vaginitis green yellow discharge and burning with urination x 2 weeks and has boyfriend on and off x 4 years and has been in hot tub recently and a times blood in urine and reduced urination     Review of Systems  Constitutional:  Negative for weight loss.  HENT:  Negative for hearing loss.   Eyes:  Negative for blurred vision.  Respiratory:  Negative for shortness of breath.   Cardiovascular:  Negative for chest pain.  Gastrointestinal:  Negative for abdominal pain and blood in stool.  Genitourinary:  Positive for dysuria, frequency and hematuria.       +vaginal dischage  Musculoskeletal:  Negative for falls and joint pain.  Skin:  Negative for rash.  Neurological:  Negative for headaches.  Psychiatric/Behavioral:  Negative for depression.    Past Medical History:  Diagnosis Date   Anemia    Low iron and hemoglobin   Anginal pain ([place])    states she thinks it is stress   Anxiety    Asthma    COVID-19    07/05/20, 03/2021   Depression    Dysrhythmia    states she has PVC's   Fibromyalgia    Hypertension    Recurrent vomiting 02/04/2018   Schatzki's ring    dialated 3 times by Tiffany Kocher    Past Surgical History:  Procedure Laterality Date   ABDOMINAL ADHESION SURGERY     BUNIONECTOMY     left foot    CERVICAL FUSION     CESAREAN SECTION     CHOLECYSTECTOMY  June 2013   GASTRIC BYPASS     Family History  Problem Relation Age of Onset   Hypertension Mother    Glaucoma Mother    Heart disease Father    Stroke Father    Breast cancer Maternal Aunt    Cancer Maternal Aunt    Colon cancer Neg Hx    Ovarian cancer Neg Hx    Social History   Socioeconomic History   Marital status: Divorced    Spouse name: Not on file   Number of children: Not on file   Years of education: Not on file   Highest education level: Not on file  Occupational History   Not on file   Tobacco Use   Smoking status: Never   Smokeless tobacco: Never  Substance and Sexual Activity   Alcohol use: Yes    Alcohol/week: 3.0 standard drinks of alcohol    Types: 3 Glasses of wine per week   Drug use: No   Sexual activity: Yes  Other Topics Concern   Not on file  Social History Narrative   Lives with spouse, son, and stepson who has serious psychiatric issues and alcohol abuse   Social Determinants of Health   Financial Resource Strain: Low Risk  (04/07/2021)   Overall Financial Resource Strain (CARDIA)    Difficulty of Paying Living Expenses: Not hard at all  Food Insecurity: No Food Insecurity (04/07/2021)   Hunger Vital Sign    Worried About Running Out of Food in the Last Year: Never true    Ran Out of Food in the Last Year: Never true  Transportation Needs: No Transportation Needs (04/07/2021)   PRAPARE - Hydrologist (Medical): No    Lack of Transportation (Non-Medical): No  Physical Activity: Not on file  Stress: No Stress Concern Present (04/07/2021)   [place]    Feeling of Stress : Not at all  Social Connections: Unknown (04/07/2021)   Social Connection and Isolation Panel [NHANES]    Frequency of Communication with Friends and Family: More than three times a week    Frequency of Social Gatherings with Friends and Family: More than three times a week    Attends Religious Services: Not on file    Active Member of Clubs or Organizations: Not on file    Attends Archivist Meetings: Not on file    Marital Status: Not on file  Intimate Partner Violence: Not At Risk (04/07/2021)   Humiliation, Afraid, Rape, and Kick questionnaire    Fear of Current or Ex-Partner: No    Emotionally Abused: No    Physically Abused: No    Sexually Abused: No   Current Meds  Medication Sig   acyclovir (ZOVIRAX) 400 MG tablet Take 1 tablet (400 mg total) by mouth 5 (five) times  daily.   albuterol (VENTOLIN HFA) 108 (90 Base) MCG/ACT inhaler TAKE 2 PUFFS BY MOUTH EVERY 6 HOURS AS NEEDED FOR WHEEZE OR SHORTNESS OF BREATH   atorvastatin (LIPITOR) 20 MG tablet TAKE 1 TABLET BY MOUTH EVERY DAY   calcium carbonate (OS-CAL) 600 MG TABS Take 600 mg by mouth 2 (two) times daily with a meal.   conjugated estrogens (PREMARIN) vaginal cream Place 1 Applicatorful vaginally daily. FOR 2 WEEKS,  THEN TWICE WEEKLY THEREAFTER   cyclobenzaprine (FLEXERIL) 5 MG tablet Take 1 tablet (5 mg total) by mouth at bedtime as needed for muscle spasms.   diclofenac Sodium (VOLTAREN) 1 % GEL Apply 2 g topically 4 (four) times daily.   escitalopram (LEXAPRO) 20 MG tablet Take 20 mg by mouth daily.    fluticasone (FLONASE) 50 MCG/ACT nasal spray PLACE 2 SPRAYS INTO BOTH NOSTRILS DAILY AS NEEDED FOR ALLERGIES OR RHINITIS. AFTER NASAL SALINE   furosemide (LASIX) 20 MG tablet Take 1 tablet (20 mg total) by mouth as needed for edema.   gabapentin (NEURONTIN) 100 MG capsule TAKE 1 CAPSULE (100 MG TOTAL) BY MOUTH THREE TIMES DAILY.   latanoprost (XALATAN) 0.005 % ophthalmic solution 1 drop at bedtime.   LORazepam (ATIVAN) 1 MG tablet TAKE 1 TABLET BY MOUTH TWICE A DAY AS DIRECTED FOR ANXIETY/PANIC   metoprolol succinate (TOPROL-XL) 50 MG 24 hr tablet TAKE 0.5 TABLETS (25 MG TOTAL) BY MOUTH DAILY. TAKE WITH OR IMMEDIATELY FOLLOWING A MEAL.   Multiple Vitamin (MULTIVITAMIN) tablet Take 1 tablet by mouth daily. Bariatric Vitamin   pantoprazole (PROTONIX) 40 MG tablet TAKE 1 TABLET BY MOUTH EVERY DAY   traMADol (ULTRAM) 50 MG tablet Take 1 tablet (50 mg total) by mouth daily as needed.   VITAMIN D PO Take by mouth.   zolpidem (AMBIEN) 10 MG tablet Take 10 mg by mouth at bedtime as needed for sleep.   Allergies  Allergen Reactions   Bacitracin-Polymyxin B Rash   Eggs Or Egg-Derived Products    Betadine [Povidone Iodine] Rash   Recent Results (from the past 2160 hour(s))  Comprehensive metabolic panel      Status: Abnormal   Collection Time: 07/06/21 11:19 AM  Result Value Ref Range   Sodium 137 135 - 145 mEq/L   Potassium 3.0 (L) 3.5 - 5.1 mEq/L   Chloride 101 96 - 112 mEq/L   CO2 28 19 - 32 mEq/L   Glucose, Bld 110 (H)  70 - 99 mg/dL   BUN 7 6 - 23 mg/dL   Creatinine, Ser 0.69 0.40 - 1.20 mg/dL   Total Bilirubin 0.3 0.2 - 1.2 mg/dL   Alkaline Phosphatase 96 39 - 117 U/L   AST 20 0 - 37 U/L   ALT 25 0 - 35 U/L   Total Protein 6.5 6.0 - 8.3 g/dL   Albumin 3.7 3.5 - 5.2 g/dL   GFR 90.36 >60.00 mL/min    Comment: Calculated using the CKD-EPI Creatinine Equation (2021)   Calcium 8.6 8.4 - 10.5 mg/dL  CBC with Differential/Platelet     Status: Abnormal   Collection Time: 07/06/21 11:19 AM  Result Value Ref Range   WBC 6.4 4.0 - 10.5 K/uL   RBC 4.06 3.87 - 5.11 Mil/uL   Hemoglobin 11.3 (L) 12.0 - 15.0 g/dL   HCT 35.0 (L) 36.0 - 46.0 %   MCV 86.3 78.0 - 100.0 fl   MCHC 32.3 30.0 - 36.0 g/dL   RDW 14.3 11.5 - 15.5 %   Platelets 199.0 150.0 - 400.0 K/uL   Neutrophils Relative % 64.3 43.0 - 77.0 %   Lymphocytes Relative 20.8 12.0 - 46.0 %   Monocytes Relative 9.0 3.0 - 12.0 %   Eosinophils Relative 5.0 0.0 - 5.0 %   Basophils Relative 0.9 0.0 - 3.0 %   Neutro Abs 4.1 1.4 - 7.7 K/uL   Lymphs Abs 1.3 0.7 - 4.0 K/uL   Monocytes Absolute 0.6 0.1 - 1.0 K/uL   Eosinophils Absolute 0.3 0.0 - 0.7 K/uL   Basophils Absolute 0.1 0.0 - 0.1 K/uL  Basic Metabolic Panel (BMET)     Status: Abnormal   Collection Time: 07/30/21  3:48 PM  Result Value Ref Range   Glucose 120 (H) 70 - 99 mg/dL   BUN 8 8 - 27 mg/dL   Creatinine, Ser 0.72 0.57 - 1.00 mg/dL   eGFR 92 >59 mL/min/1.73   BUN/Creatinine Ratio 11 (L) 12 - 28   Sodium 142 134 - 144 mmol/L   Potassium 3.6 3.5 - 5.2 mmol/L   Chloride 102 96 - 106 mmol/L   CO2 23 20 - 29 mmol/L   Calcium 9.4 8.7 - 10.3 mg/dL  Magnesium     Status: None   Collection Time: 07/30/21  3:48 PM  Result Value Ref Range   Magnesium 2.1 1.6 - 2.3 mg/dL    Objective  Body mass index is 28.16 kg/m. Wt Readings from Last 3 Encounters:  09/29/21 169 lb 3.2 oz (76.7 kg)  09/13/21 169 lb (76.7 kg)  07/30/21 170 lb 12.8 oz (77.5 kg)   Temp Readings from Last 3 Encounters:  09/29/21 98.3 F (36.8 C) (Oral)  09/13/21 97.7 F (36.5 C) (Oral)  07/30/21 97.9 F (36.6 C) (Oral)   BP Readings from Last 3 Encounters:  09/29/21 120/62  09/13/21 102/76  07/30/21 132/80   Pulse Readings from Last 3 Encounters:  09/29/21 91  09/13/21 92  07/30/21 82    Physical Exam Vitals and nursing note reviewed.  Constitutional:      Appearance: Normal appearance. She is well-developed and well-groomed.  HENT:     Head: Normocephalic and atraumatic.  Eyes:     Conjunctiva/sclera: Conjunctivae normal.     Pupils: Pupils are equal, round, and reactive to light.  Cardiovascular:     Rate and Rhythm: Normal rate and regular rhythm.     Heart sounds: Normal heart sounds. No murmur heard. Pulmonary:  Effort: Pulmonary effort is normal.     Breath sounds: Normal breath sounds.  Abdominal:     General: Abdomen is flat. Bowel sounds are normal.     Tenderness: There is no abdominal tenderness.  Genitourinary:    General: Normal vulva.     Exam position: Supine.     Pubic Area: No rash.      Labia:        Right: No rash.        Left: No rash.      Vagina: Normal.     Cervix: Discharge present.     Uterus: Normal.      Adnexa: Right adnexa normal and left adnexa normal.  Musculoskeletal:        General: No tenderness.  Skin:    General: Skin is warm and dry.  Neurological:     General: No focal deficit present.     Mental Status: She is alert and oriented to person, place, and time. Mental status is at baseline.     Cranial Nerves: Cranial nerves 2-12 are intact.     Motor: Motor function is intact.     Coordination: Coordination is intact.     Gait: Gait is intact.  Psychiatric:        Attention and Perception: Attention and  perception normal.        Mood and Affect: Mood and affect normal.        Speech: Speech normal.        Behavior: Behavior normal. Behavior is cooperative.        Thought Content: Thought content normal.        Cognition and Memory: Cognition and memory normal.        Judgment: Judgment normal.     Assessment  Plan  Acute vaginitis - Plan: Cervicovaginal ancillary only( [place])  Acute cystitis without hematuria - Plan: Urinalysis, Routine w reflex microscopic, Urine Culture   Provider: Dr. Olivia Mackie McLean-Scocuzza-Internal Medicine

## 2021-10-01 ENCOUNTER — Telehealth: Payer: Self-pay | Admitting: Internal Medicine

## 2021-10-01 LAB — CERVICOVAGINAL ANCILLARY ONLY
Bacterial Vaginitis (gardnerella): NEGATIVE
Candida Glabrata: NEGATIVE
Candida Vaginitis: NEGATIVE
Chlamydia: NEGATIVE
Comment: NEGATIVE
Comment: NEGATIVE
Comment: NEGATIVE
Comment: NEGATIVE
Comment: NEGATIVE
Comment: NORMAL
Neisseria Gonorrhea: NEGATIVE
Trichomonas: NEGATIVE

## 2021-10-01 LAB — URINALYSIS, ROUTINE W REFLEX MICROSCOPIC
Bilirubin Urine: NEGATIVE
Glucose, UA: NEGATIVE
Nitrite: NEGATIVE
Specific Gravity, Urine: 1.023 (ref 1.001–1.035)
pH: 6.5 (ref 5.0–8.0)

## 2021-10-01 LAB — MICROSCOPIC MESSAGE

## 2021-10-01 LAB — URINE CULTURE
MICRO NUMBER:: 13667515
SPECIMEN QUALITY:: ADEQUATE

## 2021-10-01 NOTE — Telephone Encounter (Signed)
Pt called stating she would like to know what the culture results were for her urine

## 2021-10-04 ENCOUNTER — Other Ambulatory Visit: Payer: Self-pay | Admitting: Internal Medicine

## 2021-10-04 DIAGNOSIS — N3000 Acute cystitis without hematuria: Secondary | ICD-10-CM

## 2021-10-04 MED ORDER — CEPHALEXIN 500 MG PO CAPS: 500.0000 mg | ORAL_CAPSULE | Freq: Four times a day (QID) | ORAL | 0 refills | Status: DC

## 2021-10-04 NOTE — Telephone Encounter (Signed)
Noted. Urine culture and note reviewed. I sent in keflex to cover the two kinds of bacteria that grew in her urine. If not improving with this she will need to be reevaluated.

## 2021-10-04 NOTE — Addendum Note (Signed)
Addended by: Glori Luis on: 10/04/2021 04:30 PM   Modules accepted: Orders

## 2021-10-04 NOTE — Telephone Encounter (Addendum)
Pt would like for someone to call her regarding an antibiotic being called in. Pt states the culture is positive

## 2021-10-04 NOTE — Telephone Encounter (Signed)
Patient states she saw her results online and it look like it's positive.  Patient states she thought she would get an antibiotic if her results were positive.  Patient states she still feels sick and would like to have the medicine as soon as possible.  Patient asked that we please call her.

## 2021-10-27 ENCOUNTER — Other Ambulatory Visit: Payer: Self-pay | Admitting: Internal Medicine

## 2021-10-27 DIAGNOSIS — M47812 Spondylosis without myelopathy or radiculopathy, cervical region: Secondary | ICD-10-CM

## 2021-10-27 DIAGNOSIS — M62838 Other muscle spasm: Secondary | ICD-10-CM

## 2021-10-27 MED ORDER — CYCLOBENZAPRINE HCL 5 MG PO TABS: 5.0000 mg | ORAL_TABLET | Freq: Every evening | ORAL | 2 refills | Status: DC | PRN

## 2021-10-27 NOTE — Telephone Encounter (Signed)
Refilled: 09/13/2021 Last OV: 09/13/2021 Next OV: 12/31/2021

## 2021-10-27 NOTE — Telephone Encounter (Signed)
Duplicate request

## 2021-10-27 NOTE — Telephone Encounter (Signed)
Pt need refill on Cyclobenzaprine and tramadol sent to [facility] [address]

## 2021-10-28 ENCOUNTER — Telehealth: Payer: Self-pay | Admitting: Internal Medicine

## 2021-10-28 MED ORDER — MOLNUPIRAVIR EUA 200MG CAPSULE: 4.0000 | ORAL_CAPSULE | Freq: Two times a day (BID) | ORAL | 0 refills | Status: AC

## 2021-10-28 NOTE — Telephone Encounter (Signed)
Verbal order given by Dr. Darrick Huntsman to send in the anti viral.

## 2021-10-28 NOTE — Telephone Encounter (Signed)
Patient tested positive for COVID yesterday and today. Symptoms are: cough, head congestion, some SOB, body aches, body temp 99.8 last night. Patient is requesting antiviral medication. No appointments today or tomorrow at this office.

## 2021-12-05 ENCOUNTER — Other Ambulatory Visit: Payer: Self-pay | Admitting: Internal Medicine

## 2021-12-27 ENCOUNTER — Ambulatory Visit (INDEPENDENT_AMBULATORY_CARE_PROVIDER_SITE_OTHER): Payer: Medicare Other

## 2021-12-27 DIAGNOSIS — Z23 Encounter for immunization: Secondary | ICD-10-CM | POA: Diagnosis not present

## 2021-12-31 ENCOUNTER — Encounter: Payer: Self-pay | Admitting: Internal Medicine

## 2021-12-31 ENCOUNTER — Ambulatory Visit (INDEPENDENT_AMBULATORY_CARE_PROVIDER_SITE_OTHER): Payer: Medicare Other | Admitting: Internal Medicine

## 2021-12-31 VITALS — BP 140/90 | HR 82 | Temp 97.6°F | Resp 14 | Ht 65.0 in | Wt 178.8 lb

## 2021-12-31 DIAGNOSIS — N390 Urinary tract infection, site not specified: Secondary | ICD-10-CM

## 2021-12-31 DIAGNOSIS — F322 Major depressive disorder, single episode, severe without psychotic features: Secondary | ICD-10-CM | POA: Diagnosis not present

## 2021-12-31 DIAGNOSIS — R7303 Prediabetes: Secondary | ICD-10-CM | POA: Diagnosis not present

## 2021-12-31 DIAGNOSIS — E119 Type 2 diabetes mellitus without complications: Secondary | ICD-10-CM

## 2021-12-31 DIAGNOSIS — N3941 Urge incontinence: Secondary | ICD-10-CM | POA: Diagnosis not present

## 2021-12-31 LAB — POCT URINALYSIS DIPSTICK
Bilirubin, UA: NEGATIVE
Glucose, UA: NEGATIVE
Ketones, UA: NEGATIVE
Leukocytes, UA: NEGATIVE
Nitrite, UA: NEGATIVE
Protein, UA: NEGATIVE
Spec Grav, UA: 1.015 (ref 1.010–1.025)
Urobilinogen, UA: 0.2 E.U./dL
pH, UA: 5.5 (ref 5.0–8.0)

## 2021-12-31 LAB — POCT GLYCOSYLATED HEMOGLOBIN (HGB A1C): Hemoglobin A1C: 5.9 % — AB (ref 4.0–5.6)

## 2021-12-31 MED ORDER — OXYBUTYNIN CHLORIDE ER 10 MG PO TB24: 10.0000 mg | ORAL_TABLET | Freq: Every day | ORAL | 2 refills | Status: DC

## 2021-12-31 NOTE — Patient Instructions (Addendum)
FOR YOUR BLOOD PRESSURE  Reduce amlodipine  to 5 mg daily  Increase metoprolol   to 50 mg daily (full tablet)   Return for RN VISIT IN  ONE TO 2 WEEKS FOR BP CHECK    RESUME THE ESTROGEN CREAM   nightly for 2 weeks,  be sure to apply a small amount with your fingertip around the urethra as well  for two weeks,  then reduce use to every 3 days  Rinse off after hot tub or pool time because the chlorine and scented additives can be very irritating to the vagina and the urethra after menopause

## 2021-12-31 NOTE — Progress Notes (Unsigned)
Subjective:  Patient ID: Sherri Lee, female    DOB: Jan 24, 1956  Age: 66 y.o. MRN: [id number]  CC: There were no encounter diagnoses.   HPI Sherri Lee presents for medication refill o Chief Complaint  Patient presents with   Follow-up    1) Recurrent UTI"  has been having  urinary frequency for the past week . History of incontinence/OAB. Marland Kitchen  Last treatment for UTI was in July  Group A strep treated with keflex.  Prio evaluation in March : no UTI.   Symptoms resolve but  return several weeks later.   Historical review: 2 infections per year.  Risk factors:  atrophic vaginitis not using premarin .  Uses the hot tub several times per week but not sure if boyfriend manages the pool water appropriately and often does not shower immediately after coming out of the hot tub    2) chronic pain of thoracic , lumbar and cervical spine , managed with tramadol and gabapentin.    Taking tramadol once daily and gabapentin 100 mg tid.Refill history confirmed via [place] Controlled Substance databas, accessed by me today.. last refill of tramadol was october 15   3) HTN:  currently taking 25 mg  metoprolol and 7.5 mg amlodipine.  Noticing fluid retention.  Not checking BP  4) D/obesity with Weight fain:  not exercising,  not eating right .   Lab Results  Component Value Date   HGBA1C 6.4 09/01/2020     Outpatient Medications Prior to Visit  Medication Sig Dispense Refill   acyclovir (ZOVIRAX) 400 MG tablet Take 1 tablet (400 mg total) by mouth 5 (five) times daily. 35 tablet 3   albuterol (VENTOLIN HFA) 108 (90 Base) MCG/ACT inhaler TAKE 2 PUFFS BY MOUTH EVERY 6 HOURS AS NEEDED FOR WHEEZE OR SHORTNESS OF BREATH 8.5 each 2   atorvastatin (LIPITOR) 20 MG tablet TAKE 1 TABLET BY MOUTH EVERY DAY 90 tablet 3   calcium carbonate (OS-CAL) 600 MG TABS Take 600 mg by mouth 2 (two) times daily with a meal.     cephALEXin (KEFLEX) 500 MG capsule Take 1 capsule (500 mg total) by  mouth 4 (four) times daily. 28 capsule 0   conjugated estrogens (PREMARIN) vaginal cream Place 1 Applicatorful vaginally daily. FOR 2 WEEKS,  THEN TWICE WEEKLY THEREAFTER 42.5 g 12   cyclobenzaprine (FLEXERIL) 5 MG tablet Take 1 tablet (5 mg total) by mouth at bedtime as needed for muscle spasms. 30 tablet 2   diclofenac Sodium (VOLTAREN) 1 % GEL Apply 2 g topically 4 (four) times daily. 100 g G   escitalopram (LEXAPRO) 20 MG tablet Take 20 mg by mouth daily.      fluticasone (FLONASE) 50 MCG/ACT nasal spray PLACE 2 SPRAYS INTO BOTH NOSTRILS DAILY AS NEEDED FOR ALLERGIES OR RHINITIS. AFTER NASAL SALINE 48 mL 0   furosemide (LASIX) 20 MG tablet Take 1 tablet (20 mg total) by mouth as needed for edema. 20 tablet 1   gabapentin (NEURONTIN) 100 MG capsule TAKE 1 CAPSULE (100 MG TOTAL) BY MOUTH THREE TIMES DAILY. 270 capsule 3   latanoprost (XALATAN) 0.005 % ophthalmic solution 1 drop at bedtime.     LORazepam (ATIVAN) 1 MG tablet TAKE 1 TABLET BY MOUTH TWICE A DAY AS DIRECTED FOR ANXIETY/PANIC  2   metoprolol succinate (TOPROL-XL) 50 MG 24 hr tablet TAKE 0.5 TABLETS (25 MG TOTAL) BY MOUTH DAILY. TAKE WITH OR IMMEDIATELY FOLLOWING A MEAL. 45 tablet 3   Multiple Vitamin (MULTIVITAMIN)  tablet Take 1 tablet by mouth daily. Bariatric Vitamin     pantoprazole (PROTONIX) 40 MG tablet TAKE 1 TABLET BY MOUTH EVERY DAY 90 tablet 3   potassium chloride SA (KLOR-CON M) 20 MEQ tablet Take 1 tablet (20 mEq total) by mouth 2 (two) times daily. (Patient not taking: Reported on 09/13/2021) 10 tablet 0   traMADol (ULTRAM) 50 MG tablet TAKE 1 TABLET BY MOUTH DAILY AS NEEDED. 30 tablet 5   VITAMIN D PO Take by mouth.     zolpidem (AMBIEN) 10 MG tablet Take 10 mg by mouth at bedtime as needed for sleep.     No facility-administered medications prior to visit.    Review of Systems;  Patient denies headache, fevers, malaise, unintentional weight loss, skin rash, eye pain, sinus congestion and sinus pain, sore throat,  dysphagia,  hemoptysis , cough, dyspnea, wheezing, chest pain, palpitations, orthopnea, edema, abdominal pain, nausea, melena, diarrhea, constipation, flank pain, dysuria, hematuria, urinary  Frequency, nocturia, numbness, tingling, seizures,  Focal weakness, Loss of consciousness,  Tremor, insomnia, depression, anxiety, and suicidal ideation.      Objective:  There were no vitals taken for this visit.  BP Readings from Last 3 Encounters:  09/29/21 120/62  09/13/21 102/76  07/30/21 132/80    Wt Readings from Last 3 Encounters:  09/29/21 169 lb 3.2 oz (76.7 kg)  09/13/21 169 lb (76.7 kg)  07/30/21 170 lb 12.8 oz (77.5 kg)    General appearance: alert, cooperative and appears stated age Ears: normal TM's and external ear canals both ears Throat: lips, mucosa, and tongue normal; teeth and gums normal Neck: no adenopathy, no carotid bruit, supple, symmetrical, trachea midline and thyroid not enlarged, symmetric, no tenderness/mass/nodules Back: symmetric, no curvature. ROM normal. No CVA tenderness. Lungs: clear to auscultation bilaterally Heart: regular rate and rhythm, S1, S2 normal, no murmur, click, rub or gallop Abdomen: soft, non-tender; bowel sounds normal; no masses,  no organomegaly Pulses: 2+ and symmetric Skin: Skin color, texture, turgor normal. No rashes or lesions Lymph nodes: Cervical, supraclavicular, and axillary nodes normal. Neuro:  awake and interactive with normal mood and affect. Higher cortical functions are normal. Speech is clear without word-finding difficulty or dysarthria. Extraocular movements are intact. Visual fields of both eyes are grossly intact. Sensation to light touch is grossly intact bilaterally of upper and lower extremities. Motor examination shows 4+/5 symmetric hand grip and upper extremity and 5/5 lower extremity strength. There is no pronation or drift. Gait is non-ataxic   Lab Results  Component Value Date   HGBA1C 6.4 09/01/2020   HGBA1C  5.9 05/28/2020   HGBA1C 5.7 07/29/2019    Lab Results  Component Value Date   CREATININE 0.72 07/30/2021   CREATININE 0.69 07/06/2021   CREATININE 0.65 01/29/2021    Lab Results  Component Value Date   WBC 6.4 07/06/2021   HGB 11.3 (L) 07/06/2021   HCT 35.0 (L) 07/06/2021   PLT 199.0 07/06/2021   GLUCOSE 120 (H) 07/30/2021   CHOL 174 09/01/2020   TRIG 55.0 09/01/2020   HDL 84.90 09/01/2020   LDLDIRECT 99.0 09/04/2015   LDLCALC 79 09/01/2020   ALT 25 07/06/2021   AST 20 07/06/2021   NA 142 07/30/2021   K 3.6 07/30/2021   CL 102 07/30/2021   CREATININE 0.72 07/30/2021   BUN 8 07/30/2021   CO2 23 07/30/2021   TSH 0.59 07/29/2019   HGBA1C 6.4 09/01/2020   MICROALBUR 0.6 09/04/2015    No results found.  Assessment &  Plan:   Problem List Items Addressed This Visit   None   I spent a total of   minutes with this patient in a face to face visit on the date of this encounter reviewing the last office visit with me in       ,  most recent visit with cardiology ,    ,  patient's diet and exercise habits, home blood pressure /blod sugar readings, recent ER visit including labs and imaging studies ,   and post visit ordering of testing and therapeutics.    Follow-up: No follow-ups on file.   Crecencio Mc, MD

## 2022-01-02 NOTE — Assessment & Plan Note (Signed)
She has  increased responsibilities caring for parents who live independent of each other.  Her affect is not depressed ; however, and she is not suicidal.  Given her ongoing relationship with Dr Kasandra Knudsen and her maximal dose of lexapro 20 mg daily and concurrent use of lorazepam, I have deferred to Dr Kasandra Knudsen.

## 2022-01-02 NOTE — Assessment & Plan Note (Signed)
UTI ruled out.  oxybutynin prescribed as a trial

## 2022-01-02 NOTE — Assessment & Plan Note (Signed)
A1c was nearly diagnostic of type 2 DM but has improved   She has gained weight due to lack of exercise. I have addressed  BMI and recomm2ended a low glycemic index diet utilizing smaller more frequent meals to increase metabolism and prevent enlargement of stomach .  I have also recommended that patient start exercising with a goal of 30 minutes of walking a minimum of 5 days per week.   Lab Results  Component Value Date   HGBA1C 5.9 (A) 12/31/2021

## 2022-01-07 ENCOUNTER — Telehealth: Payer: Self-pay | Admitting: Internal Medicine

## 2022-01-07 NOTE — Telephone Encounter (Signed)
Patient is requesting a new prescription for Amlodipine. She is currently taking 2.5 and at last office visit Dr Derrel Nip increased to 5mg , she is running out of medication.

## 2022-01-10 MED ORDER — AMLODIPINE BESYLATE 5 MG PO TABS: 5.0000 mg | ORAL_TABLET | Freq: Every day | ORAL | 1 refills | Status: DC

## 2022-01-10 NOTE — Telephone Encounter (Signed)
Patient returned office phone call and note read.  

## 2022-01-10 NOTE — Addendum Note (Signed)
Addended by: Adair Laundry on: 01/10/2022 09:29 AM   Modules accepted: Orders

## 2022-01-10 NOTE — Telephone Encounter (Signed)
LMTCB. Per last office note 5 mg Amlodipine has been sent in.

## 2022-01-13 ENCOUNTER — Ambulatory Visit (INDEPENDENT_AMBULATORY_CARE_PROVIDER_SITE_OTHER): Payer: Medicare Other

## 2022-01-13 VITALS — BP 110/70

## 2022-01-13 DIAGNOSIS — I10 Essential (primary) hypertension: Secondary | ICD-10-CM | POA: Diagnosis not present

## 2022-01-13 NOTE — Progress Notes (Signed)
Patient here for nurse visit BP check per order from Dr. Derrel Nip.   Patient reports compliance with prescribed BP medications: yes  Last dose of BP medication: this morning she took the new dose the provider spoke of, she was taking 1 amlodipine and 1/2 of the metoprolol and she has started taking 1 amlodipine and  1 whole metoprolol tablet.     BP Readings from Last 3 Encounters:  01/13/22 110/70  12/31/21 (!) 140/90  09/29/21 120/62   Pulse Readings from Last 3 Encounters:  12/31/21 82  09/29/21 91  09/13/21 92    Per Dr. Derrel Nip patient is to continue taking her medication as directed.    Patient verbalized understanding of instructions.   Gordy Councilman, CMA

## 2022-01-14 DIAGNOSIS — H401131 Primary open-angle glaucoma, bilateral, mild stage: Secondary | ICD-10-CM | POA: Diagnosis not present

## 2022-01-14 DIAGNOSIS — Z961 Presence of intraocular lens: Secondary | ICD-10-CM | POA: Diagnosis not present

## 2022-01-14 DIAGNOSIS — H16223 Keratoconjunctivitis sicca, not specified as Sjogren's, bilateral: Secondary | ICD-10-CM | POA: Diagnosis not present

## 2022-01-25 ENCOUNTER — Other Ambulatory Visit: Payer: Self-pay | Admitting: Internal Medicine

## 2022-01-25 DIAGNOSIS — M62838 Other muscle spasm: Secondary | ICD-10-CM

## 2022-01-25 DIAGNOSIS — M47812 Spondylosis without myelopathy or radiculopathy, cervical region: Secondary | ICD-10-CM

## 2022-02-18 ENCOUNTER — Other Ambulatory Visit: Payer: Self-pay | Admitting: Internal Medicine

## 2022-02-22 ENCOUNTER — Telehealth (INDEPENDENT_AMBULATORY_CARE_PROVIDER_SITE_OTHER): Payer: Medicare Other | Admitting: Nurse Practitioner

## 2022-02-22 DIAGNOSIS — R051 Acute cough: Secondary | ICD-10-CM | POA: Diagnosis not present

## 2022-02-22 MED ORDER — PREDNISONE 20 MG PO TABS: 40.0000 mg | ORAL_TABLET | Freq: Every day | ORAL | 0 refills | Status: DC

## 2022-02-22 MED ORDER — AZITHROMYCIN 250 MG PO TABS: ORAL_TABLET | ORAL | 0 refills | Status: AC

## 2022-02-22 NOTE — Progress Notes (Signed)
   Established Patient Office Visit  An audio-only tele-health visit was completed today for this patient. I connected with  Sherri Lee on 02/22/22 utilizing audio-only technology and verified that I am speaking with the correct person using two identifiers. The patient was located at their home, and I was located at the office of [facility] during the encounter. I discussed the limitations of evaluation and management by telemedicine. The patient expressed understanding and agreed to proceed.     Subjective   Patient ID: Sherri Lee, female    DOB: [birth year]  Age: 66 y.o. MRN: [id number]  Chief Complaint  Patient presents with   Cough    Symptom onset 5 days ago. Symptoms positive for fever, congestion, sore throat, cough, sputum production, shortness of breath. Has history of asthma. Tested for COVID via t home test yesterday which was negative. Recently traveled to beach. No known sick contacts. Using albuterol inhaler as needed for wheezing which is helping symptoms.     Review of Systems  Constitutional:  Positive for fever.  HENT:  Positive for congestion and sore throat.   Respiratory:  Positive for cough, sputum production (yellow/green) and shortness of breath.   Cardiovascular:  Negative for chest pain.  Gastrointestinal:  Negative for nausea and vomiting.  Musculoskeletal:  Positive for myalgias.  Neurological:  Positive for headaches.      Objective:     There were no vitals taken for this visit.   Physical Exam Comprehensive physical exam not completed today as office visit was conducted remotely.  Patient sounded well over the phone. She did cough, but did not appear to be in respiratory distress.  Patient was alert and oriented, and appeared to have appropriate judgment.   No results found for any visits on 02/22/22.    The 10-year ASCVD risk score (Arnett DK, et al., 2019) is: 15.3%    Assessment &  Plan:   Problem List Items Addressed This Visit       Other   Acute cough - Primary    Acute, patient repeated at-home covid test as today is the last day she may be eligible for antiviral medication. Test was negative. Treat with 5 day course of prednisone 40mg  by mouth every morning with breakfast, patient was educated on side effects of prednisone including insomnia, increased risk of GI bleed, increased appetite, and irritability.  She was told to take medication in the morning with food and to avoid NSAID use while taking the medication. Will also prescribe z-pack that patient can take if symptoms persist past 10 days. The patient reports their understanding.        Relevant Medications   azithromycin (ZITHROMAX) 250 MG tablet   predniSONE (DELTASONE) 20 MG tablet    Return if symptoms worsen or fail to improve. Total time spent on the phone was 34 minutes and 18 seconds   , NP

## 2022-02-22 NOTE — Assessment & Plan Note (Addendum)
Acute, patient repeated at-home covid test as today is the last day she may be eligible for antiviral medication. Test was negative. Treat with 5 day course of prednisone 40mg  by mouth every morning with breakfast, patient was educated on side effects of prednisone including insomnia, increased risk of GI bleed, increased appetite, and irritability.  She was told to take medication in the morning with food and to avoid NSAID use while taking the medication. Will also prescribe z-pack that patient can take if symptoms persist past 10 days. The patient reports their understanding.

## 2022-03-02 ENCOUNTER — Ambulatory Visit (INDEPENDENT_AMBULATORY_CARE_PROVIDER_SITE_OTHER): Payer: Medicare Other | Admitting: Family

## 2022-03-02 ENCOUNTER — Ambulatory Visit (INDEPENDENT_AMBULATORY_CARE_PROVIDER_SITE_OTHER): Payer: Medicare Other

## 2022-03-02 ENCOUNTER — Encounter: Payer: Self-pay | Admitting: Family

## 2022-03-02 VITALS — BP 130/86 | HR 75 | Temp 97.8°F | Ht 65.0 in | Wt 175.2 lb

## 2022-03-02 DIAGNOSIS — R0989 Other specified symptoms and signs involving the circulatory and respiratory systems: Secondary | ICD-10-CM

## 2022-03-02 DIAGNOSIS — J40 Bronchitis, not specified as acute or chronic: Secondary | ICD-10-CM

## 2022-03-02 DIAGNOSIS — R059 Cough, unspecified: Secondary | ICD-10-CM | POA: Diagnosis not present

## 2022-03-02 HISTORY — DX: Bronchitis, not specified as acute or chronic: J40

## 2022-03-02 LAB — POC COVID19 BINAXNOW: SARS Coronavirus 2 Ag: NEGATIVE

## 2022-03-02 LAB — POCT INFLUENZA A/B

## 2022-03-02 MED ORDER — GUAIFENESIN-CODEINE 100-10 MG/5ML PO SYRP: 5.0000 mL | ORAL_SOLUTION | Freq: Every evening | ORAL | 0 refills | Status: DC | PRN

## 2022-03-02 MED ORDER — PREDNISONE 10 MG PO TABS: ORAL_TABLET | ORAL | 0 refills | Status: DC

## 2022-03-02 MED ORDER — FLUTICASONE-SALMETEROL 100-50 MCG/ACT IN AEPB: 1.0000 | INHALATION_SPRAY | Freq: Two times a day (BID) | RESPIRATORY_TRACT | 1 refills | Status: DC

## 2022-03-02 NOTE — Patient Instructions (Addendum)
Start advair which is a low-dose steroid inhaler.  Please ensure to rinse mouth out as we discussed  Use albuterol every 6 hours for first 24 hours to get good medication into the lungs and loosen congestion; after, you may use as needed and eventually stop all together when cough resolves.  I have sent a prescription for Cheratussin.  Please do not take Cheratussin with tramadol as we discussed Please take cough medication at night only as needed. As we discussed, I do not recommend dosing throughout the day as coughing is a protective mechanism . It also helps to break up thick mucous.  Do not take cough suppressants with alcohol as can lead to trouble breathing. Advise caution if taking cough suppressant and operating machinery ( i.e driving a car) as you may feel very tired.   Start prednisone taper.  Please ensure you get all of the doses of prednisone and prior to noon as it can interfere with sleep.  I placed a referral to pulmonology, Dr. Meredeth Ide Let us know if you dont hear back within a week in regards to an appointment being scheduled.   So that you are aware, if you are [facility] MyChart user , please pay attention to your MyChart messages as you may receive a MyChart message with a phone number to call and schedule this test/appointment own your own from our referral coordinator. This is a new process so I do not want you to miss this message.  If you are not a MyChart user, you will receive a phone call.

## 2022-03-02 NOTE — Progress Notes (Signed)
Assessment & Plan:  Bronchitis Assessment & Plan: Query history of asthma.  Patient previously been on Advair and Singulair.  Will resume Advair today and prednisone taper.  She was treated with azithromycin and on day 5 of cough and we discussed today if this has been viral in nature.  As she takes care of her aging parents, pending respiratory panel including RSV.  Point-of-care flu and COVID-negative today.  She is afebrile without acute respiratory distress.  Pending chest x-ray to ensure no pneumonia.  Start Cheratussin and counseled not to use if she were to take tramadol.  Discussed if symptoms continued despite prednisone, Advair, would start doxycycline.  Pending consult with pulmonology to reestablish care to discuss cough, formal evaluation, repeat PFTs as well as if patient require a new sleep study as she is no longer wearing CPAP after weight loss surgery. Patient will let me know she is doing.   I looked up patient on [place] Controlled Substances Reporting System PMP AWARE and saw no activity that raised concern of inappropriate use.    Orders: -     predniSONE; Take 4 tablets ( total 40 mg) by mouth for 2 days; take 3 tablets ( total 30 mg) by mouth for 2 days; take 2 tablets ( total 20 mg) by mouth for 1 day; take 1 tablet ( total 10 mg) by mouth for 1 day.  Dispense: 17 tablet; Refill: 0 -     guaiFENesin-Codeine; Take 5 mLs by mouth at bedtime as needed for cough.  Dispense: 70 mL; Refill: 0 -     Respiratory virus panel  Chest congestion -     POC COVID-19 BinaxNow -     POCT Influenza A/B -     Ambulatory referral to Pulmonology -     Fluticasone-Salmeterol; Inhale 1 puff into the lungs 2 (two) times daily.  Dispense: 1 each; Refill: 1 -     DG Chest 2 View -     predniSONE; Take 4 tablets ( total 40 mg) by mouth for 2 days; take 3 tablets ( total 30 mg) by mouth for 2 days; take 2 tablets ( total 20 mg) by mouth for 1 day; take 1 tablet ( total 10 mg) by mouth for 1 day.   Dispense: 17 tablet; Refill: 0 -     Respiratory virus panel     Return precautions given.   Risks, benefits, and alternatives of the medications and treatment plan prescribed today were discussed, and patient expressed understanding.   Education regarding symptom management and diagnosis given to patient on AVS either electronically or printed.  No follow-ups on file.  Rennie Plowman, FNP  Subjective:    Patient ID: Sherri Lee, female    DOB: [birth year], 66 y.o.   MRN: [id number]  CC: Sherri Lee is a 66 y.o. female who presents today for an acute visit.    HPI: Chief complaint of productive cough x 10 days, unchanged.   She had virtual visit last week and given zpak and prednisone 40mg  x 5 days with slight relief she felt from prednisone.   She had negative covid test at home.   Endorses ear pressure, nasal congestion    Denies cp, sob, fever.   She is using albuterol couple of times per day for wheezing.   Treated with doxycyline 06/2021 Subsequently changed to levaquin, prednisone by Dr 07/2021 without improvement so changed to doxycycline on 07/06/21  She reports H/o asthma and since weight loss  surgery , she has felt asthma improved.  She has been on Advair in the past feels that it was helpful for her.  Reports having PFT study done in the past (unable to see results).  She had been on Singulair in the past.   She had previously followed with Dr. Meredeth Ide, pulmonology.   Reports significant H/o seasonal allergies  H/o OSA- since weight loss, she is not wearing cipap  History of hypertension, IBS, diabetes, depression Never smoker.    Previously seen by [facility]   Allergies: Bacitracin-polymyxin b, Eggs or egg-derived products, and Betadine [povidone iodine] Current Outpatient Medications on File Prior to Visit  Medication Sig Dispense Refill   acyclovir (ZOVIRAX) 400 MG tablet Take 1 tablet (400 mg total) by mouth 5 (five) times  daily. 35 tablet 3   albuterol (VENTOLIN HFA) 108 (90 Base) MCG/ACT inhaler TAKE 2 PUFFS BY MOUTH EVERY 6 HOURS AS NEEDED FOR WHEEZE OR SHORTNESS OF BREATH 8.5 each 2   amLODipine (NORVASC) 5 MG tablet Take 1 tablet (5 mg total) by mouth daily. 90 tablet 1   atorvastatin (LIPITOR) 20 MG tablet TAKE 1 TABLET BY MOUTH EVERY DAY 90 tablet 3   cyclobenzaprine (FLEXERIL) 5 MG tablet TAKE 1 TABLET BY MOUTH AT BEDTIME AS NEEDED FOR MUSCLE SPASMS. 30 tablet 2   diclofenac Sodium (VOLTAREN) 1 % GEL Apply 2 g topically 4 (four) times daily. 100 g G   escitalopram (LEXAPRO) 20 MG tablet Take 20 mg by mouth daily.      fluticasone (FLONASE) 50 MCG/ACT nasal spray PLACE 2 SPRAYS INTO BOTH NOSTRILS DAILY AS NEEDED FOR ALLERGIES OR RHINITIS. AFTER NASAL SALINE 48 mL 0   gabapentin (NEURONTIN) 100 MG capsule TAKE 1 CAPSULE (100 MG TOTAL) BY MOUTH THREE TIMES DAILY. 270 capsule 3   latanoprost (XALATAN) 0.005 % ophthalmic solution 1 drop at bedtime.     LORazepam (ATIVAN) 1 MG tablet TAKE 1 TABLET BY MOUTH TWICE A DAY AS DIRECTED FOR ANXIETY/PANIC  2   Multiple Vitamin (MULTIVITAMIN) tablet Take 1 tablet by mouth daily. Bariatric Vitamin     oxybutynin (DITROPAN-XL) 10 MG 24 hr tablet TAKE 1 TABLET BY MOUTH EVERYDAY AT BEDTIME 90 tablet 1   pantoprazole (PROTONIX) 40 MG tablet TAKE 1 TABLET BY MOUTH EVERY DAY 90 tablet 3   potassium chloride SA (KLOR-CON M) 20 MEQ tablet Take 1 tablet (20 mEq total) by mouth 2 (two) times daily. 10 tablet 0   traMADol (ULTRAM) 50 MG tablet TAKE 1 TABLET BY MOUTH DAILY AS NEEDED. 30 tablet 5   VITAMIN D PO Take by mouth.     zolpidem (AMBIEN) 10 MG tablet Take 10 mg by mouth at bedtime as needed for sleep.     calcium carbonate (OS-CAL) 600 MG TABS Take 600 mg by mouth 2 (two) times daily with a meal. (Patient not taking: Reported on 02/22/2022)     furosemide (LASIX) 20 MG tablet TAKE 1 TABLET (20 MG TOTAL) BY MOUTH AS NEEDED FOR EDEMA. (Patient not taking: Reported on 02/22/2022)  20 tablet 1   metoprolol succinate (TOPROL-XL) 50 MG 24 hr tablet TAKE 0.5 TABLETS (25 MG TOTAL) BY MOUTH DAILY. TAKE WITH OR IMMEDIATELY FOLLOWING A MEAL. (Patient not taking: Reported on 02/22/2022) 45 tablet 3   No current facility-administered medications on file prior to visit.    Review of Systems  Constitutional:  Negative for chills and fever.  HENT:  Positive for congestion.   Respiratory:  Positive for cough and  wheezing. Negative for shortness of breath.   Cardiovascular:  Negative for chest pain and palpitations.  Gastrointestinal:  Negative for nausea and vomiting.      Objective:    BP 130/86   Pulse 75   Temp 97.8 F (36.6 C) (Oral)   Ht 5\' 5"  (1.651 m)   Wt 175 lb 3.2 oz (79.5 kg)   SpO2 99%   BMI 29.15 kg/m   BP Readings from Last 3 Encounters:  03/02/22 130/86  01/13/22 110/70  12/31/21 (!) 140/90   Wt Readings from Last 3 Encounters:  03/02/22 175 lb 3.2 oz (79.5 kg)  12/31/21 178 lb 12.8 oz (81.1 kg)  09/29/21 169 lb 3.2 oz (76.7 kg)    Physical Exam Vitals reviewed.  Constitutional:      Appearance: She is well-developed.  HENT:     Head: Normocephalic and atraumatic.     Right Ear: Hearing, tympanic membrane, ear canal and external ear normal. No decreased hearing noted. No drainage, swelling or tenderness. No middle ear effusion. No foreign body. Tympanic membrane is not erythematous or bulging.     Left Ear: Hearing, tympanic membrane, ear canal and external ear normal. No decreased hearing noted. No drainage, swelling or tenderness.  No middle ear effusion. No foreign body. Tympanic membrane is not erythematous or bulging.     Nose: Nose normal. No rhinorrhea.     Right Sinus: No maxillary sinus tenderness or frontal sinus tenderness.     Left Sinus: No maxillary sinus tenderness or frontal sinus tenderness.     Mouth/Throat:     Pharynx: Uvula midline. No oropharyngeal exudate or posterior oropharyngeal erythema.     Tonsils: No tonsillar  abscesses.  Eyes:     Conjunctiva/sclera: Conjunctivae normal.  Cardiovascular:     Rate and Rhythm: Regular rhythm.     Pulses: Normal pulses.     Heart sounds: Normal heart sounds.  Pulmonary:     Effort: Pulmonary effort is normal.     Breath sounds: Normal breath sounds. No wheezing, rhonchi or rales.  Lymphadenopathy:     Head:     Right side of head: No submental, submandibular, tonsillar, preauricular, posterior auricular or occipital adenopathy.     Left side of head: No submental, submandibular, tonsillar, preauricular, posterior auricular or occipital adenopathy.     Cervical: No cervical adenopathy.  Skin:    General: Skin is warm and dry.  Neurological:     Mental Status: She is alert.  Psychiatric:        Speech: Speech normal.        Behavior: Behavior normal.        Thought Content: Thought content normal.

## 2022-03-02 NOTE — Assessment & Plan Note (Addendum)
Query history of asthma.  Patient previously been on Advair and Singulair.  Will resume Advair today and prednisone taper.  She was treated with azithromycin and on day 5 of cough and we discussed today if this has been viral in nature.  As she takes care of her aging parents, pending respiratory panel including RSV.  Point-of-care flu and COVID-negative today.  She is afebrile without acute respiratory distress.  Pending chest x-ray to ensure no pneumonia.  Start Cheratussin and counseled not to use if she were to take tramadol.  Discussed if symptoms continued despite prednisone, Advair, would start doxycycline.  Consider Singulair in the future.  Pending consult with pulmonology to reestablish care to discuss cough, formal evaluation, repeat PFTs as well as if patient require a new sleep study as she is no longer wearing CPAP after weight loss surgery. Patient will let me know she is doing.   I looked up patient on [place] Controlled Substances Reporting System PMP AWARE and saw no activity that raised concern of inappropriate use.

## 2022-03-03 ENCOUNTER — Telehealth: Payer: Self-pay | Admitting: Internal Medicine

## 2022-03-03 NOTE — Telephone Encounter (Signed)
Pt would like to be called regarding her lab results and want to know if the RSV results are back

## 2022-03-04 NOTE — Telephone Encounter (Signed)
Sent myhcart message

## 2022-03-05 LAB — RESPIRATORY VIRUS PANEL
Adenovirus B: NOT DETECTED
HUMAN PARAINFLU VIRUS 1: NOT DETECTED
HUMAN PARAINFLU VIRUS 2: NOT DETECTED
HUMAN PARAINFLU VIRUS 3: NOT DETECTED
INFLUENZA A SUBTYPE H1: NOT DETECTED
INFLUENZA A SUBTYPE H3: NOT DETECTED
Influenza A: NOT DETECTED
Influenza B: NOT DETECTED
Metapneumovirus: NOT DETECTED
Respiratory Syncytial Virus A: NOT DETECTED
Respiratory Syncytial Virus B: NOT DETECTED
Rhinovirus: DETECTED — AB

## 2022-03-08 NOTE — Telephone Encounter (Signed)
Lm FOR PT TO CB 

## 2022-03-18 ENCOUNTER — Other Ambulatory Visit: Payer: Self-pay | Admitting: Internal Medicine

## 2022-03-21 NOTE — Telephone Encounter (Signed)
Looks like pt is currently taking the 5 mg Amlodipine. Is it okay to refuse the 10 mg and and the 2.5 mg?

## 2022-03-23 ENCOUNTER — Other Ambulatory Visit: Payer: Self-pay | Admitting: Internal Medicine

## 2022-04-04 ENCOUNTER — Encounter: Payer: Self-pay | Admitting: Internal Medicine

## 2022-04-04 ENCOUNTER — Ambulatory Visit (INDEPENDENT_AMBULATORY_CARE_PROVIDER_SITE_OTHER): Payer: Medicare Other | Admitting: Internal Medicine

## 2022-04-04 VITALS — BP 124/78 | HR 83 | Temp 98.1°F | Ht 65.0 in | Wt 174.4 lb

## 2022-04-04 DIAGNOSIS — J329 Chronic sinusitis, unspecified: Secondary | ICD-10-CM | POA: Diagnosis not present

## 2022-04-04 DIAGNOSIS — M545 Low back pain, unspecified: Secondary | ICD-10-CM

## 2022-04-04 DIAGNOSIS — I10 Essential (primary) hypertension: Secondary | ICD-10-CM | POA: Diagnosis not present

## 2022-04-04 DIAGNOSIS — R7303 Prediabetes: Secondary | ICD-10-CM | POA: Diagnosis not present

## 2022-04-04 DIAGNOSIS — E119 Type 2 diabetes mellitus without complications: Secondary | ICD-10-CM | POA: Diagnosis not present

## 2022-04-04 DIAGNOSIS — E785 Hyperlipidemia, unspecified: Secondary | ICD-10-CM

## 2022-04-04 MED ORDER — FLUTICASONE PROPIONATE 50 MCG/ACT NA SUSP: 2.0000 | Freq: Every day | NASAL | 5 refills | Status: DC | PRN

## 2022-04-04 NOTE — Assessment & Plan Note (Signed)
Managed with metoprolol dose 50 mg daily to manage tachycardia and reduce amlodipine  at 5 mg

## 2022-04-04 NOTE — Progress Notes (Signed)
Subjective:  Patient ID: Sherri Lee, female    DOB: 08/31/1955  Age: 67 y.o. MRN: [id number]  CC: The primary encounter diagnosis was Essential hypertension. Diagnoses of Diabetes mellitus without complication ([place]), Hyperlipidemia, unspecified hyperlipidemia type, Sinusitis, unspecified chronicity, unspecified location, Prediabetes, and Low back pain associated with a spinal disorder other than radiculopathy or spinal stenosis were also pertinent to this visit.   HPI Sherri Lee presents for FOLLOW UP ON MULTIPLE ISSUES  Chief Complaint  Patient presents with   Medical Management of Chronic Issues    3 month follow up on diabetes   1) Bronchitis:  treated twice in December for bronchitis,.  Chest x  ray negative,  lifelong nonsmoker,  rhinovirus isolated from  panel.  BOYFRIEND SMOKES   2)  OVERWEIGHT:  cites caregiver fatigue, Neither parents live with her .  Mother  has been in A/L , since her multiple hospitalization last year.  Mother has a complicated medical history but wants to return to her home and expects patient who is retired to be her caregiver. She uses a walker. And has fallen multiple times. Patient's father has remarried,  has CVD, dementia  and CKD   3) chronic pain :  lower back now involving neck as well. Using diclofenac  gel on neck muscles , tramadol give C/I to NSAID use . Refill history confirmed via [place] Controlled Substance databas, accessed by me today..   Outpatient Medications Prior to Visit  Medication Sig Dispense Refill   acyclovir (ZOVIRAX) 400 MG tablet Take 1 tablet (400 mg total) by mouth 5 (five) times daily. 35 tablet 3   albuterol (VENTOLIN HFA) 108 (90 Base) MCG/ACT inhaler TAKE 2 PUFFS BY MOUTH EVERY 6 HOURS AS NEEDED FOR WHEEZE OR SHORTNESS OF BREATH 8.5 each 2   amLODipine (NORVASC) 5 MG tablet Take 1 tablet (5 mg total) by mouth daily. 90 tablet 1   atorvastatin (LIPITOR) 20 MG tablet TAKE 1 TABLET BY MOUTH EVERY  DAY 90 tablet 3   calcium carbonate (OS-CAL) 600 MG TABS Take 600 mg by mouth 2 (two) times daily with a meal.     cyclobenzaprine (FLEXERIL) 5 MG tablet TAKE 1 TABLET BY MOUTH AT BEDTIME AS NEEDED FOR MUSCLE SPASMS. 30 tablet 2   diclofenac Sodium (VOLTAREN) 1 % GEL Apply 2 g topically 4 (four) times daily. 100 g G   escitalopram (LEXAPRO) 20 MG tablet Take 20 mg by mouth daily.      estradiol (ESTRACE) 0.1 MG/GM vaginal cream Place vaginally.     furosemide (LASIX) 20 MG tablet TAKE 1 TABLET (20 MG TOTAL) BY MOUTH AS NEEDED FOR EDEMA. 20 tablet 1   gabapentin (NEURONTIN) 100 MG capsule TAKE 1 CAPSULE (100 MG TOTAL) BY MOUTH THREE TIMES DAILY. 270 capsule 3   latanoprost (XALATAN) 0.005 % ophthalmic solution 1 drop at bedtime.     LORazepam (ATIVAN) 1 MG tablet TAKE 1 TABLET BY MOUTH TWICE A DAY AS DIRECTED FOR ANXIETY/PANIC  2   metoprolol succinate (TOPROL-XL) 50 MG 24 hr tablet TAKE 0.5 TABLETS (25 MG TOTAL) BY MOUTH DAILY. TAKE WITH OR IMMEDIATELY FOLLOWING A MEAL. 45 tablet 3   Multiple Vitamin (MULTIVITAMIN) tablet Take 1 tablet by mouth daily. Bariatric Vitamin     oxybutynin (DITROPAN-XL) 10 MG 24 hr tablet TAKE 1 TABLET BY MOUTH EVERYDAY AT BEDTIME 90 tablet 1   pantoprazole (PROTONIX) 40 MG tablet TAKE 1 TABLET BY MOUTH EVERY DAY 90 tablet 3   potassium  chloride SA (KLOR-CON M) 20 MEQ tablet Take 1 tablet (20 mEq total) by mouth 2 (two) times daily. 10 tablet 0   traMADol (ULTRAM) 50 MG tablet TAKE 1 TABLET BY MOUTH DAILY AS NEEDED. 30 tablet 5   VITAMIN D PO Take by mouth.     zolpidem (AMBIEN) 10 MG tablet Take 10 mg by mouth at bedtime as needed for sleep.     fluticasone (FLONASE) 50 MCG/ACT nasal spray PLACE 2 SPRAYS INTO BOTH NOSTRILS DAILY AS NEEDED FOR ALLERGIES OR RHINITIS. AFTER NASAL SALINE 48 mL 0   fluticasone-salmeterol (ADVAIR) 100-50 MCG/ACT AEPB Inhale 1 puff into the lungs 2 (two) times daily. 1 each 1   guaiFENesin-codeine (ROBITUSSIN AC) 100-10 MG/5ML syrup Take 5  mLs by mouth at bedtime as needed for cough. (Patient not taking: Reported on 04/04/2022) 70 mL 0   predniSONE (DELTASONE) 10 MG tablet Take 4 tablets ( total 40 mg) by mouth for 2 days; take 3 tablets ( total 30 mg) by mouth for 2 days; take 2 tablets ( total 20 mg) by mouth for 1 day; take 1 tablet ( total 10 mg) by mouth for 1 day. (Patient not taking: Reported on 04/04/2022) 17 tablet 0   No facility-administered medications prior to visit.    Review of Systems;  Patient denies headache, fevers, malaise, unintentional weight loss, skin rash, eye pain, sinus congestion and sinus pain, sore throat, dysphagia,  hemoptysis , cough, dyspnea, wheezing, chest pain, palpitations, orthopnea, edema, abdominal pain, nausea, melena, diarrhea, constipation, flank pain, dysuria, hematuria, urinary  Frequency, nocturia, numbness, tingling, seizures,  Focal weakness, Loss of consciousness,  Tremor, insomnia, depression, anxiety, and suicidal ideation.      Objective:  BP 124/78   Pulse 83   Temp 98.1 F (36.7 C) (Oral)   Ht 5\' 5"  (1.651 m)   Wt 174 lb 6.4 oz (79.1 kg)   SpO2 99%   BMI 29.02 kg/m   BP Readings from Last 3 Encounters:  04/04/22 124/78  03/02/22 130/86  01/13/22 110/70    Wt Readings from Last 3 Encounters:  04/04/22 174 lb 6.4 oz (79.1 kg)  03/02/22 175 lb 3.2 oz (79.5 kg)  12/31/21 178 lb 12.8 oz (81.1 kg)    Physical Exam  Lab Results  Component Value Date   HGBA1C 5.9 (A) 12/31/2021   HGBA1C 6.4 09/01/2020   HGBA1C 5.9 05/28/2020    Lab Results  Component Value Date   CREATININE 0.72 07/30/2021   CREATININE 0.69 07/06/2021   CREATININE 0.65 01/29/2021    Lab Results  Component Value Date   WBC 6.4 07/06/2021   HGB 11.3 (L) 07/06/2021   HCT 35.0 (L) 07/06/2021   PLT 199.0 07/06/2021   GLUCOSE 120 (H) 07/30/2021   CHOL 174 09/01/2020   TRIG 55.0 09/01/2020   HDL 84.90 09/01/2020   LDLDIRECT 99.0 09/04/2015   LDLCALC 79 09/01/2020   ALT 25 07/06/2021    AST 20 07/06/2021   NA 142 07/30/2021   K 3.6 07/30/2021   CL 102 07/30/2021   CREATININE 0.72 07/30/2021   BUN 8 07/30/2021   CO2 23 07/30/2021   TSH 0.59 07/29/2019   HGBA1C 5.9 (A) 12/31/2021   MICROALBUR 0.6 09/04/2015    No results found.  Assessment & Plan:  .Essential hypertension Assessment & Plan: Managed with metoprolol dose 50 mg daily to manage tachycardia and reduce amlodipine  at 5 mg   Orders: -     Microalbumin / creatinine urine ratio;  Future  Diabetes mellitus without complication ([facility])  Hyperlipidemia, unspecified hyperlipidemia type -     Lipid panel -     LDL cholesterol, direct -     Lipid Panel w/reflex Direct LDL; Future -     TSH; Future  Sinusitis, unspecified chronicity, unspecified location -     Fluticasone Propionate; Place 2 sprays into both nostrils daily as needed for allergies or rhinitis. After nasal saline  Dispense: 48 mL; Refill: 5  Prediabetes -     Comprehensive metabolic panel; Future -     Hemoglobin A1c; Future  Low back pain associated with a spinal disorder other than radiculopathy or spinal stenosis Assessment & Plan: Tramadol refilled.  encouraged to consider PT or resume exercise to strengthen paraspinus muscles      I provided 30 minutes of face-to-face time during this encounter reviewing patient's last visit with me, patient's  most recent visit with cardiology,  nephrology,  and neurology,  recent surgical and non surgical procedures, previous  labs and imaging studies, counseling on currently addressed issues,  and post visit ordering to diagnostics and therapeutics .   Follow-up: Return in about 3 months (around 07/04/2022) for chronic pain management, hypertension.   Crecencio Mc, MD

## 2022-04-04 NOTE — Assessment & Plan Note (Signed)
Tramadol refilled.  encouraged to consider PT or resume exercise to strengthen paraspinus muscles

## 2022-04-04 NOTE — Patient Instructions (Addendum)
If you want to keep the diabetes from coming back You need to start walking for exercise and AVOID FAST FOOD AND JUNK SNACKS .    Here are a few healthy items you can buy instead of eating fast food:    Vegg[employer] Made Great ! Makes a low carb spinach & egg white frittata    [employer] "low carb power bowl"  entrees and  "Steamer" entrees are are great low carb entrees that microwave in 5 minutes      Premier Protein shake for breakfast or late night snack .   We will check labs in 3 months, prior to next visit

## 2022-04-17 ENCOUNTER — Other Ambulatory Visit: Payer: Self-pay | Admitting: Internal Medicine

## 2022-04-17 DIAGNOSIS — M47812 Spondylosis without myelopathy or radiculopathy, cervical region: Secondary | ICD-10-CM

## 2022-04-17 DIAGNOSIS — M62838 Other muscle spasm: Secondary | ICD-10-CM

## 2022-04-21 ENCOUNTER — Other Ambulatory Visit: Payer: Self-pay | Admitting: Internal Medicine

## 2022-04-28 ENCOUNTER — Ambulatory Visit (INDEPENDENT_AMBULATORY_CARE_PROVIDER_SITE_OTHER): Payer: Medicare Other

## 2022-04-28 VITALS — Ht 65.0 in | Wt 174.0 lb

## 2022-04-28 DIAGNOSIS — Z0000 Encounter for general adult medical examination without abnormal findings: Secondary | ICD-10-CM

## 2022-04-28 NOTE — Patient Instructions (Addendum)
[name] , Thank you for taking time to come for your Medicare Wellness Visit. I appreciate your ongoing commitment to your health goals. Please review the following plan we discussed and let me know if I can assist you in the future.   These are the goals we discussed:  Goals      Weight (lb) < 166 lb (75.3 kg)     I would like to lose about 10lb. Increase activity.        This is a list of the screening recommended for you and due dates:  Health Maintenance  Topic Date Due   Yearly kidney health urinalysis for diabetes  09/03/2016   COVID-19 Vaccine (5 - 2023-24 season) 05/14/2022*   Zoster (Shingles) Vaccine (1 of 2) 06/01/2022*   DEXA scan (bone density measurement)  08/13/2022*   Mammogram  04/29/2023*   Hemoglobin A1C  07/02/2022   DTaP/Tdap/Td vaccine (3 - Td or Tdap) 07/13/2022   Yearly kidney function blood test for diabetes  07/31/2022   Eye exam for diabetics  09/12/2022   Medicare Annual Wellness Visit  04/29/2023   Colon Cancer Screening  07/30/2029   Pneumonia Vaccine  Completed   Flu Shot  Completed   Hepatitis C Screening: USPSTF Recommendation to screen - Ages 18-79 yo.  Completed   HPV Vaccine  Aged Out  *Topic was postponed. The date shown is not the original due date.    Advanced directives: not yet completed.  Conditions/risks identified: none new.   Next appointment: Follow up in one year for your annual wellness visit    Preventive Care 65 Years and Older, Female Preventive care refers to lifestyle choices and visits with your health care provider that can promote health and wellness. What does preventive care include? A yearly physical exam. This is also called an annual well check. Dental exams once or twice a year. Routine eye exams. Ask your health care provider how often you should have your eyes checked. Personal lifestyle choices, including: Daily care of your teeth and gums. Regular physical activity. Eating a healthy diet. Avoiding  tobacco and drug use. Limiting alcohol use. Practicing safe sex. Taking low-dose aspirin every day. Taking vitamin and mineral supplements as recommended by your health care provider. What happens during an annual well check? The services and screenings done by your health care provider during your annual well check will depend on your age, overall health, lifestyle risk factors, and family history of disease. Counseling  Your health care provider may ask you questions about your: Alcohol use. Tobacco use. Drug use. Emotional well-being. Home and relationship well-being. Sexual activity. Eating habits. History of falls. Memory and ability to understand (cognition). Work and work Statistician. Reproductive health. Screening  You may have the following tests or measurements: Height, weight, and BMI. Blood pressure. Lipid and cholesterol levels. These may be checked every 5 years, or more frequently if you are over 45 years old. Skin check. Lung cancer screening. You may have this screening every year starting at age 67 if you have a 30-pack-year history of smoking and currently smoke or have quit within the past 15 years. Fecal occult blood test (FOBT) of the stool. You may have this test every year starting at age 50. Flexible sigmoidoscopy or colonoscopy. You may have a sigmoidoscopy every 5 years or a colonoscopy every 10 years starting at age 71. Hepatitis C blood test. Hepatitis B blood test. Sexually transmitted disease (STD) testing. Diabetes screening. This is done by checking  your blood sugar (glucose) after you have not eaten for a while (fasting). You may have this done every 1-3 years. Bone density scan. This is done to screen for osteoporosis. You may have this done starting at age 76. Mammogram. This may be done every 1-2 years. Talk to your health care provider about how often you should have regular mammograms. Talk with your health care provider about your test  results, treatment options, and if necessary, the need for more tests. Vaccines  Your health care provider may recommend certain vaccines, such as: Influenza vaccine. This is recommended every year. Tetanus, diphtheria, and acellular pertussis (Tdap, Td) vaccine. You may need a Td booster every 10 years. Zoster vaccine. You may need this after age 54. Pneumococcal 13-valent conjugate (PCV13) vaccine. One dose is recommended after age 33. Pneumococcal polysaccharide (PPSV23) vaccine. One dose is recommended after age 68. Talk to your health care provider about which screenings and vaccines you need and how often you need them. This information is not intended to replace advice given to you by your health care provider. Make sure you discuss any questions you have with your health care provider. Document Released: 03/27/2015 Document Revised: 11/18/2015 Document Reviewed: 12/30/2014 Elsevier Interactive Patient Education  2017 [employer] Prevention in the Home Falls can cause injuries. They can happen to people of all ages. There are many things you can do to make your home safe and to help prevent falls. What can I do on the outside of my home? Regularly fix the edges of walkways and driveways and fix any cracks. Remove anything that might make you trip as you walk through a door, such as a raised step or threshold. Trim any bushes or trees on the path to your home. Use bright outdoor lighting. Clear any walking paths of anything that might make someone trip, such as rocks or tools. Regularly check to see if handrails are loose or broken. Make sure that both sides of any steps have handrails. Any raised decks and porches should have guardrails on the edges. Have any leaves, snow, or ice cleared regularly. Use sand or salt on walking paths during winter. Clean up any spills in your garage right away. This includes oil or grease spills. What can I do in the bathroom? Use night  lights. Install grab bars by the toilet and in the tub and shower. Do not use towel bars as grab bars. Use non-skid mats or decals in the tub or shower. If you need to sit down in the shower, use a plastic, non-slip stool. Keep the floor dry. Clean up any water that spills on the floor as soon as it happens. Remove soap buildup in the tub or shower regularly. Attach bath mats securely with double-sided non-slip rug tape. Do not have throw rugs and other things on the floor that can make you trip. What can I do in the bedroom? Use night lights. Make sure that you have a light by your bed that is easy to reach. Do not use any sheets or blankets that are too big for your bed. They should not hang down onto the floor. Have a firm chair that has side arms. You can use this for support while you get dressed. Do not have throw rugs and other things on the floor that can make you trip. What can I do in the kitchen? Clean up any spills right away. Avoid walking on wet floors. Keep items that you use a lot  in easy-to-reach places. If you need to reach something above you, use a strong step stool that has a grab bar. Keep electrical cords out of the way. Do not use floor polish or wax that makes floors slippery. If you must use wax, use non-skid floor wax. Do not have throw rugs and other things on the floor that can make you trip. What can I do with my stairs? Do not leave any items on the stairs. Make sure that there are handrails on both sides of the stairs and use them. Fix handrails that are broken or loose. Make sure that handrails are as long as the stairways. Check any carpeting to make sure that it is firmly attached to the stairs. Fix any carpet that is loose or worn. Avoid having throw rugs at the top or bottom of the stairs. If you do have throw rugs, attach them to the floor with carpet tape. Make sure that you have a light switch at the top of the stairs and the bottom of the stairs. If  you do not have them, ask someone to add them for you. What else can I do to help prevent falls? Wear shoes that: Do not have high heels. Have rubber bottoms. Are comfortable and fit you well. Are closed at the toe. Do not wear sandals. If you use a stepladder: Make sure that it is fully opened. Do not climb a closed stepladder. Make sure that both sides of the stepladder are locked into place. Ask someone to hold it for you, if possible. Clearly mark and make sure that you can see: Any grab bars or handrails. First and last steps. Where the edge of each step is. Use tools that help you move around (mobility aids) if they are needed. These include: Canes. Walkers. Scooters. Crutches. Turn on the lights when you go into a dark area. Replace any light bulbs as soon as they burn out. Set up your furniture so you have a clear path. Avoid moving your furniture around. If any of your floors are uneven, fix them. If there are any pets around you, be aware of where they are. Review your medicines with your doctor. Some medicines can make you feel dizzy. This can increase your chance of falling. Ask your doctor what other things that you can do to help prevent falls. This information is not intended to replace advice given to you by your health care provider. Make sure you discuss any questions you have with your health care provider. Document Released: 12/25/2008 Document Revised: 08/06/2015 Document Reviewed: 04/04/2014 Elsevier Interactive Patient Education  2017 [employer].

## 2022-04-28 NOTE — Progress Notes (Signed)
Subjective:   Sherri Lee is a 67 y.o. female who presents for Medicare Annual (Subsequent) preventive examination.  Review of Systems    No ROS.  Medicare Wellness Virtual Visit.  Visual/audio telehealth visit, UTA vital signs.   See social history for additional risk factors.   Cardiac Risk Factors include: advanced age (>48mn, >>59women);hypertension     Objective:    Today's Vitals   04/28/22 1514  Weight: 174 lb (78.9 kg)  Height: 5' 5"$  (1.651 m)   Body mass index is 28.96 kg/m.     04/28/2022    3:25 PM 04/07/2021    2:07 PM 01/29/2021    3:34 PM 08/01/2020    3:20 AM 04/06/2020    2:15 PM 10/21/2018   11:18 AM 05/07/2017    3:47 AM  Advanced Directives  Does Patient Have a Medical Advance Directive? No No No No No No No  Would patient like information on creating a medical advance directive? No - Patient declined No - Patient declined No - Patient declined No - Patient declined No - Patient declined No - Patient declined No - Patient declined    Current Medications (verified) Outpatient Encounter Medications as of 04/28/2022  Medication Sig   acyclovir (ZOVIRAX) 400 MG tablet Take 1 tablet (400 mg total) by mouth 5 (five) times daily.   albuterol (VENTOLIN HFA) 108 (90 Base) MCG/ACT inhaler TAKE 2 PUFFS BY MOUTH EVERY 6 HOURS AS NEEDED FOR WHEEZE OR SHORTNESS OF BREATH   amLODipine (NORVASC) 5 MG tablet Take 1 tablet (5 mg total) by mouth daily.   atorvastatin (LIPITOR) 20 MG tablet TAKE 1 TABLET BY MOUTH EVERY DAY   calcium carbonate (OS-CAL) 600 MG TABS Take 600 mg by mouth 2 (two) times daily with a meal.   cyclobenzaprine (FLEXERIL) 5 MG tablet Take 1 tablet (5 mg total) by mouth at bedtime.   diclofenac Sodium (VOLTAREN) 1 % GEL Apply 2 g topically 4 (four) times daily.   escitalopram (LEXAPRO) 20 MG tablet Take 20 mg by mouth daily.    estradiol (ESTRACE) 0.1 MG/GM vaginal cream Place vaginally.   fluticasone (FLONASE) 50 MCG/ACT nasal spray  Place 2 sprays into both nostrils daily as needed for allergies or rhinitis. After nasal saline   furosemide (LASIX) 20 MG tablet TAKE 1 TABLET (20 MG TOTAL) BY MOUTH AS NEEDED FOR EDEMA.   gabapentin (NEURONTIN) 100 MG capsule TAKE 1 CAPSULE (100 MG TOTAL) BY MOUTH THREE TIMES DAILY.   latanoprost (XALATAN) 0.005 % ophthalmic solution 1 drop at bedtime.   LORazepam (ATIVAN) 1 MG tablet TAKE 1 TABLET BY MOUTH TWICE A DAY AS DIRECTED FOR ANXIETY/PANIC   metoprolol succinate (TOPROL-XL) 50 MG 24 hr tablet TAKE 0.5 TABLETS (25 MG TOTAL) BY MOUTH DAILY. TAKE WITH OR IMMEDIATELY FOLLOWING A MEAL.   Multiple Vitamin (MULTIVITAMIN) tablet Take 1 tablet by mouth daily. Bariatric Vitamin   oxybutynin (DITROPAN-XL) 10 MG 24 hr tablet TAKE 1 TABLET BY MOUTH EVERYDAY AT BEDTIME   pantoprazole (PROTONIX) 40 MG tablet TAKE 1 TABLET BY MOUTH EVERY DAY   potassium chloride SA (KLOR-CON M) 20 MEQ tablet Take 1 tablet (20 mEq total) by mouth 2 (two) times daily.   traMADol (ULTRAM) 50 MG tablet TAKE 1 TABLET BY MOUTH EVERY DAY AS NEEDED   VITAMIN D PO Take by mouth.   zolpidem (AMBIEN) 10 MG tablet Take 10 mg by mouth at bedtime as needed for sleep.   No facility-administered encounter medications on file  as of 04/28/2022.    Allergies (verified) Bacitracin-polymyxin b, Eggs or egg-derived products, and Betadine [povidone iodine]   History: Past Medical History:  Diagnosis Date   Anemia    Low iron and hemoglobin   Anginal pain ([facility])    states she thinks it is stress   Anxiety    Asthma    COVID-19    07/05/20, 03/2021   Depression    Dysrhythmia    states she has PVC's   Fibromyalgia    Hypertension    Recurrent vomiting 02/04/2018   Schatzki's ring    dialated 3 times by Tiffany Kocher    Past Surgical History:  Procedure Laterality Date   ABDOMINAL ADHESION SURGERY     BUNIONECTOMY     left foot    CERVICAL FUSION     CESAREAN SECTION     CHOLECYSTECTOMY  June 2013   GASTRIC BYPASS     Family  History  Problem Relation Age of Onset   Hypertension Mother    Glaucoma Mother    Heart disease Father    Stroke Father    Breast cancer Maternal Aunt    Cancer Maternal Aunt    Colon cancer Neg Hx    Ovarian cancer Neg Hx    Social History   Socioeconomic History   Marital status: Divorced    Spouse name: Not on file   Number of children: Not on file   Years of education: Not on file   Highest education level: Not on file  Occupational History   Not on file  Tobacco Use   Smoking status: Never   Smokeless tobacco: Never  Substance and Sexual Activity   Alcohol use: Yes    Alcohol/week: 3.0 standard drinks of alcohol    Types: 3 Glasses of wine per week   Drug use: No   Sexual activity: Yes  Other Topics Concern   Not on file  Social History Narrative   Lives with spouse, son, and stepson who has serious psychiatric issues and alcohol abuse   Social Determinants of Health   Financial Resource Strain: Low Risk  (04/28/2022)   Overall Financial Resource Strain (CARDIA)    Difficulty of Paying Living Expenses: Not hard at all  Food Insecurity: No Food Insecurity (04/28/2022)   Hunger Vital Sign    Worried About Running Out of Food in the Last Year: Never true    Ran Out of Food in the Last Year: Never true  Transportation Needs: No Transportation Needs (04/28/2022)   PRAPARE - Hydrologist (Medical): No    Lack of Transportation (Non-Medical): No  Physical Activity: Sufficiently Active (04/28/2022)   Exercise Vital Sign    Days of Exercise per Week: 5 days    Minutes of Exercise per Session: 30 min  Stress: No Stress Concern Present (04/28/2022)   [place]    Feeling of Stress : Not at all  Social Connections: Unknown (04/28/2022)   Social Connection and Isolation Panel [NHANES]    Frequency of Communication with Friends and Family: More than three times a week     Frequency of Social Gatherings with Friends and Family: More than three times a week    Attends Religious Services: Not on file    Active Member of Clubs or Organizations: Not on file    Attends Archivist Meetings: Not on file    Marital Status: Not on file  Tobacco Counseling Counseling given: Not Answered   Clinical Intake:  Pre-visit preparation completed: Yes        Diabetes: No  How often do you need to have someone help you when you read instructions, pamphlets, or other written materials from your doctor or pharmacy?: 1 - Never    Interpreter Needed?: No      Activities of Daily Living    04/28/2022    3:26 PM  In your present state of health, do you have any difficulty performing the following activities:  Hearing? 0  Vision? 0  Difficulty concentrating or making decisions? 0  Walking or climbing stairs? 0  Dressing or bathing? 0  Doing errands, shopping? 0  Preparing Food and eating ? N  Using the Toilet? N  In the past six months, have you accidently leaked urine? N  Do you have problems with loss of bowel control? N  Managing your Medications? N  Managing your Finances? N  Housekeeping or managing your Housekeeping? N    Patient Care Team: Crecencio Mc, MD as PCP - General (Internal Medicine)  Indicate any recent Medical Services you may have received from other than [facility] providers in the past year (date may be approximate).     Assessment:   This is a routine wellness examination for Sherri Lee.  I connected with  Sherri Lee on 04/28/22 by a audio enabled telemedicine application and verified that I am speaking with the correct person using two identifiers.  Patient Location: Home  Provider Location: Office/Clinic  I discussed the limitations of evaluation and management by telemedicine. The patient expressed understanding and agreed to proceed.   Hearing/Vision screen Hearing Screening - Comments:: Patient  is able to hear conversational tones without difficulty. No issues reported. Vision Screening - Comments:: Followed by [facility] Wears reader lenses  Glaucoma; drops in use Cataract extraction, bilateral They have seen their ophthalmologist in the last 12 months.    Dietary issues and exercise activities discussed: Current Exercise Habits: Home exercise routine, Type of exercise: walking, Time (Minutes): 30, Frequency (Times/Week): 5, Weekly Exercise (Minutes/Week): 150, Intensity: Mild   Goals Addressed             This Visit's Progress    Weight (lb) < 166 lb (75.3 kg)   174 lb (78.9 kg)    I would like to lose about 10lb. Increase activity.       Depression Screen    04/28/2022    3:23 PM 04/04/2022    4:16 PM 03/02/2022    8:56 AM 03/02/2022    8:55 AM 12/31/2021    4:37 PM 09/29/2021    3:06 PM 09/13/2021    5:22 PM  PHQ 2/9 Scores  PHQ - 2 Score 0 2 0 0 1 2 2  $ PHQ- 9 Score 0 5 10    8    $ Fall Risk    04/28/2022    3:26 PM 04/04/2022    4:16 PM 03/02/2022    8:55 AM 12/31/2021    4:37 PM 09/29/2021    3:06 PM  Fall Risk   Falls in the past year? 0 0 0 0 0  Number falls in past yr: 0 0 0 0   Injury with Fall? 0 0 0 0   Risk for fall due to :  No Fall Risks No Fall Risks No Fall Risks   Follow up Falls evaluation completed;Falls prevention discussed Falls evaluation completed Falls evaluation completed Falls evaluation completed  FALL RISK PREVENTION PERTAINING TO THE HOME: Home free of loose throw rugs in walkways, pet beds, electrical cords, etc? Yes  Adequate lighting in your home to reduce risk of falls? Yes   ASSISTIVE DEVICES UTILIZED TO PREVENT FALLS: Life alert? No  Use of a cane, walker or w/c? No  Grab bars in the bathroom? No  Shower chair or bench in shower? No  Elevated toilet seat or a handicapped toilet? No   TIMED UP AND GO: Was the test performed? No .   Cognitive Function:        04/28/2022    3:29 PM 04/07/2021     2:20 PM 04/06/2020    2:33 PM  6CIT Screen  What Year? 0 points 0 points 0 points  What month? 0 points 0 points 0 points  What time? 0 points 0 points 0 points  Count back from 20 0 points 0 points 0 points  Months in reverse 0 points 0 points 0 points  Repeat phrase 0 points 0 points   Total Score 0 points 0 points     Immunizations Immunization History  Administered Date(s) Administered   Fluad Quad(high Dose 65+) 02/13/2020, 12/27/2021   Hepatitis B 07/12/2012   Influenza,inj,Quad PF,6+ Mos 01/20/2014, 02/02/2018, 01/30/2019   Influenza-Unspecified 01/13/2012, 02/08/2021   MMR 07/12/2012   Moderna Covid-19 Vaccine Bivalent Booster 71yr & up 01/06/2021   PFIZER(Purple Top)SARS-COV-2 Vaccination 06/02/2019, 06/23/2019, 01/29/2020   PNEUMOCOCCAL CONJUGATE-20 10/07/2020   Tdap 01/13/2012, 07/12/2012   Screening Tests Health Maintenance  Topic Date Due   Diabetic kidney evaluation - Urine ACR  09/03/2016   COVID-19 Vaccine (5 - 2023-24 season) 05/14/2022 (Originally 11/12/2021)   Zoster Vaccines- Shingrix (1 of 2) 06/01/2022 (Originally [birth year]22/1974)   DEXA SCAN  08/13/2022 (Originally 12/23/2019)   MAMMOGRAM  04/29/2023 (Originally 02/25/2017)   HEMOGLOBIN A1C  07/02/2022   DTaP/Tdap/Td (3 - Td or Tdap) 07/13/2022   Diabetic kidney evaluation - eGFR measurement  07/31/2022   OPHTHALMOLOGY EXAM  09/12/2022   Medicare Annual Wellness (AWV)  04/29/2023   COLONOSCOPY (Pts 45-448yrInsurance coverage will need to be confirmed)  07/30/2029   Pneumonia Vaccine 6540Years old  Completed   INFLUENZA VACCINE  Completed   Hepatitis C Screening  Completed   HPV VACCINES  Aged Out   Health Maintenance Health Maintenance Due  Topic Date Due   Diabetic kidney evaluation - Urine ACR  09/03/2016   Mammogram- deferred per patient preference.   Bone density- deferred per patient preference.   Lung Cancer Screening: (Low Dose CT Chest recommended if Age 68-80ears, 30 pack-year currently  smoking OR have quit w/in 15years.) does not qualify.   Hepatitis C Screening: Completed 02/2021.  Vision Screening: Recommended annual ophthalmology exams for early detection of glaucoma and other disorders of the eye.  Dental Screening: Recommended annual dental exams for proper oral hygiene  Community Resource Referral / Chronic Care Management: CRR required this visit?  No   CCM required this visit?  No      Plan:     I have personally reviewed and noted the following in the patient's chart:   Medical and social history Use of alcohol, tobacco or illicit drugs  Current medications and supplements including opioid prescriptions. Patient is not currently taking opioid prescriptions. Functional ability and status Nutritional status Physical activity Advanced directives List of other physicians Hospitalizations, surgeries, and ER visits in previous 12 months Vitals Screenings to include cognitive, depression, and falls Referrals and appointments  In addition, I have reviewed and discussed with patient certain preventive protocols, quality metrics, and best practice recommendations. A written personalized care plan for preventive services as well as general preventive health recommendations were provided to patient.     Leta Jungling, LPN   [phone]

## 2022-05-13 ENCOUNTER — Encounter: Payer: Self-pay | Admitting: Internal Medicine

## 2022-05-13 ENCOUNTER — Ambulatory Visit (INDEPENDENT_AMBULATORY_CARE_PROVIDER_SITE_OTHER): Payer: Medicare Other | Admitting: Internal Medicine

## 2022-05-13 VITALS — BP 124/76 | HR 83 | Temp 98.4°F | Ht 65.0 in | Wt 174.6 lb

## 2022-05-13 DIAGNOSIS — M542 Cervicalgia: Secondary | ICD-10-CM | POA: Diagnosis not present

## 2022-05-13 DIAGNOSIS — M5412 Radiculopathy, cervical region: Secondary | ICD-10-CM

## 2022-05-13 DIAGNOSIS — Z9889 Other specified postprocedural states: Secondary | ICD-10-CM

## 2022-05-13 MED ORDER — ACYCLOVIR 400 MG PO TABS: 400.0000 mg | ORAL_TABLET | Freq: Every day | ORAL | 3 refills | Status: DC

## 2022-05-13 MED ORDER — TRAMADOL HCL 50 MG PO TABS: 50.0000 mg | ORAL_TABLET | Freq: Two times a day (BID) | ORAL | 0 refills | Status: DC | PRN

## 2022-05-13 MED ORDER — PREDNISONE 10 MG PO TABS: ORAL_TABLET | ORAL | 0 refills | Status: DC

## 2022-05-13 NOTE — Patient Instructions (Signed)
Do not use more than 2 tramadol daily  Prednisone prolonged taper start tomorrow  Physical therapy referral made   Avoid shoulder bags weighing more than 5 lbs!     Acyclovir refilled

## 2022-05-13 NOTE — Progress Notes (Signed)
Subjective:  Patient ID: Sherri Lee, female    DOB: 1956-02-10  Age: 67 y.o. MRN: [id number]  CC: The primary encounter diagnosis was H/O cervical spine surgery. Diagnoses of Cervical radiculopathy at C8 and Cervicalgia were also pertinent to this visit.   HPI [name] [name] presents for treatment of neck pai  Chief Complaint  Patient presents with   Neck Pain    Ms [name] has a H/o cervical disk disease and underwent a  3 disk fusion several years ago.  For the last several months she has been  having right occipital pain that radiates to her right shoulder and upper back ,  but not beyond the deltoid muscle .  She has been taking tylenol and voltaren cream .  Has increased use of tramadol to twice daily and has run out early because her prescription is for once daily use.  She denies any recent unusual activity.  She is able to sleep because she is taking gabapentin, flexeril, voltaren,  ambien and 1/2 lorazepam daily prescribed by provider Clarene Critchley in [place].     Outpatient Medications Prior to Visit  Medication Sig Dispense Refill   albuterol (VENTOLIN HFA) 108 (90 Base) MCG/ACT inhaler TAKE 2 PUFFS BY MOUTH EVERY 6 HOURS AS NEEDED FOR WHEEZE OR SHORTNESS OF BREATH 8.5 each 2   amLODipine (NORVASC) 5 MG tablet Take 1 tablet (5 mg total) by mouth daily. 90 tablet 1   atorvastatin (LIPITOR) 20 MG tablet TAKE 1 TABLET BY MOUTH EVERY DAY 90 tablet 3   calcium carbonate (OS-CAL) 600 MG TABS Take 600 mg by mouth 2 (two) times daily with a meal.     cyclobenzaprine (FLEXERIL) 5 MG tablet Take 1 tablet (5 mg total) by mouth at bedtime. 90 tablet 1   diclofenac Sodium (VOLTAREN) 1 % GEL Apply 2 g topically 4 (four) times daily. 100 g G   escitalopram (LEXAPRO) 20 MG tablet Take 20 mg by mouth daily.      estradiol (ESTRACE) 0.1 MG/GM vaginal cream Place vaginally.     fluticasone (FLONASE) 50 MCG/ACT nasal spray Place 2 sprays into both nostrils daily  as needed for allergies or rhinitis. After nasal saline 48 mL 5   furosemide (LASIX) 20 MG tablet TAKE 1 TABLET (20 MG TOTAL) BY MOUTH AS NEEDED FOR EDEMA. 20 tablet 1   gabapentin (NEURONTIN) 100 MG capsule TAKE 1 CAPSULE (100 MG TOTAL) BY MOUTH THREE TIMES DAILY. 270 capsule 3   latanoprost (XALATAN) 0.005 % ophthalmic solution 1 drop at bedtime.     LORazepam (ATIVAN) 1 MG tablet TAKE 1 TABLET BY MOUTH TWICE A DAY AS DIRECTED FOR ANXIETY/PANIC  2   metoprolol succinate (TOPROL-XL) 50 MG 24 hr tablet TAKE 0.5 TABLETS (25 MG TOTAL) BY MOUTH DAILY. TAKE WITH OR IMMEDIATELY FOLLOWING A MEAL. 45 tablet 3   Multiple Vitamin (MULTIVITAMIN) tablet Take 1 tablet by mouth daily. Bariatric Vitamin     oxybutynin (DITROPAN-XL) 10 MG 24 hr tablet TAKE 1 TABLET BY MOUTH EVERYDAY AT BEDTIME 90 tablet 1   pantoprazole (PROTONIX) 40 MG tablet TAKE 1 TABLET BY MOUTH EVERY DAY 90 tablet 3   potassium chloride SA (KLOR-CON M) 20 MEQ tablet Take 1 tablet (20 mEq total) by mouth 2 (two) times daily. 10 tablet 0   VITAMIN D PO Take by mouth.     zolpidem (AMBIEN) 10 MG tablet Take 10 mg by mouth at bedtime as needed for sleep.     acyclovir (  ZOVIRAX) 400 MG tablet Take 1 tablet (400 mg total) by mouth 5 (five) times daily. 35 tablet 3   traMADol (ULTRAM) 50 MG tablet TAKE 1 TABLET BY MOUTH EVERY DAY AS NEEDED 30 tablet 2   No facility-administered medications prior to visit.    Review of Systems;  Patient denies headache, fevers, malaise, unintentional weight loss, skin rash, eye pain, sinus congestion and sinus pain, sore throat, dysphagia,  hemoptysis , cough, dyspnea, wheezing, chest pain, palpitations, orthopnea, edema, abdominal pain, nausea, melena, diarrhea, constipation, flank pain, dysuria, hematuria, urinary  Frequency, nocturia, numbness, tingling, seizures,  Focal weakness, Loss of consciousness,  Tremor, insomnia, depression, anxiety, and suicidal ideation.      Objective:  BP 124/76   Pulse 83    Temp 98.4 F (36.9 C) (Oral)   Ht 5\' 5"  (1.651 m)   Wt 174 lb 9.6 oz (79.2 kg)   SpO2 98%   BMI 29.05 kg/m   BP Readings from Last 3 Encounters:  05/13/22 124/76  04/04/22 124/78  03/02/22 130/86    Wt Readings from Last 3 Encounters:  05/13/22 174 lb 9.6 oz (79.2 kg)  04/28/22 174 lb (78.9 kg)  04/04/22 174 lb 6.4 oz (79.1 kg)    Physical Exam Vitals reviewed.  Constitutional:      General: She is not in acute distress.    Appearance: Normal appearance. She is normal weight. She is not ill-appearing, toxic-appearing or diaphoretic.  HENT:     Head: Normocephalic.  Eyes:     General: No scleral icterus.       Right eye: No discharge.        Left eye: No discharge.     Conjunctiva/sclera: Conjunctivae normal.  Neck:     Vascular: No carotid bruit.  Cardiovascular:     Rate and Rhythm: Normal rate and regular rhythm.     Heart sounds: Normal heart sounds.  Pulmonary:     Effort: Pulmonary effort is normal. No respiratory distress.     Breath sounds: Normal breath sounds.  Musculoskeletal:        General: Normal range of motion.     Cervical back: Tenderness present. No rigidity.  Lymphadenopathy:     Cervical: No cervical adenopathy.  Skin:    General: Skin is warm and dry.  Neurological:     General: No focal deficit present.     Mental Status: She is alert and oriented to person, place, and time. Mental status is at baseline.     Motor: Weakness present.  Psychiatric:        Mood and Affect: Mood normal.        Behavior: Behavior normal.        Thought Content: Thought content normal.        Judgment: Judgment normal.     Lab Results  Component Value Date   HGBA1C 5.9 (A) 12/31/2021   HGBA1C 6.4 09/01/2020   HGBA1C 5.9 05/28/2020    Lab Results  Component Value Date   CREATININE 0.72 07/30/2021   CREATININE 0.69 07/06/2021   CREATININE 0.65 01/29/2021    Lab Results  Component Value Date   WBC 6.4 07/06/2021   HGB 11.3 (L) 07/06/2021   HCT  35.0 (L) 07/06/2021   PLT 199.0 07/06/2021   GLUCOSE 120 (H) 07/30/2021   CHOL 174 09/01/2020   TRIG 55.0 09/01/2020   HDL 84.90 09/01/2020   LDLDIRECT 99.0 09/04/2015   LDLCALC 79 09/01/2020   ALT 25 07/06/2021  AST 20 07/06/2021   NA 142 07/30/2021   K 3.6 07/30/2021   CL 102 07/30/2021   CREATININE 0.72 07/30/2021   BUN 8 07/30/2021   CO2 23 07/30/2021   TSH 0.59 07/29/2019   HGBA1C 5.9 (A) 12/31/2021   MICROALBUR 0.6 09/04/2015    No results found.  Assessment & Plan:  .H/O cervical spine surgery  Cervical radiculopathy at C8 -     Ambulatory referral to Physical Therapy  Cervicalgia Assessment & Plan: Acute on chronic aggravated by increased physical activities in caring for mother.  Tramadol refilled for twice daily use for  30 days.  Activity modification recommended.  .prednisone taper given,  refer for PT   .  continue MR, tylenol , topical NSAID and lidocaine OTC patches. Oral use of NSAIDs  are C/I due to history of gastric bypass.    Other orders -     predniSONE; 6 tablets daily for 3 days, then reduce by 1 tablet daily until gone  Dispense: 33 tablet; Refill: 0 -     traMADol HCl; Take 1 tablet (50 mg total) by mouth every 12 (twelve) hours as needed.  Dispense: 60 tablet; Refill: 0 -     Acyclovir; Take 1 tablet (400 mg total) by mouth 5 (five) times daily.  Dispense: 35 tablet; Refill: 3    Follow-up: Return in about 3 months (around 08/13/2022).   [name], MD

## 2022-05-15 NOTE — Assessment & Plan Note (Addendum)
Acute on chronic aggravated by increased physical activities in caring for mother.  Tramadol refilled for twice daily use for  30 days.  Activity modification recommended.  .prednisone taper given,  refer for PT   .  continue MR, tylenol , topical NSAID and lidocaine OTC patches. Oral use of NSAIDs  are C/I due to history of gastric bypass.

## 2022-05-17 ENCOUNTER — Other Ambulatory Visit: Payer: Self-pay | Admitting: Internal Medicine

## 2022-06-13 ENCOUNTER — Other Ambulatory Visit: Payer: Self-pay | Admitting: Internal Medicine

## 2022-06-13 DIAGNOSIS — Z1231 Encounter for screening mammogram for malignant neoplasm of breast: Secondary | ICD-10-CM

## 2022-06-16 ENCOUNTER — Other Ambulatory Visit: Payer: Self-pay | Admitting: Internal Medicine

## 2022-06-17 ENCOUNTER — Other Ambulatory Visit: Payer: Self-pay | Admitting: Internal Medicine

## 2022-06-17 MED ORDER — TRAMADOL HCL 50 MG PO TABS: 50.0000 mg | ORAL_TABLET | Freq: Two times a day (BID) | ORAL | 4 refills | Status: DC | PRN

## 2022-06-24 DIAGNOSIS — H26491 Other secondary cataract, right eye: Secondary | ICD-10-CM | POA: Diagnosis not present

## 2022-06-29 ENCOUNTER — Other Ambulatory Visit (INDEPENDENT_AMBULATORY_CARE_PROVIDER_SITE_OTHER): Payer: Medicare Other

## 2022-06-29 DIAGNOSIS — I10 Essential (primary) hypertension: Secondary | ICD-10-CM | POA: Diagnosis not present

## 2022-06-29 DIAGNOSIS — R7303 Prediabetes: Secondary | ICD-10-CM

## 2022-06-29 DIAGNOSIS — E785 Hyperlipidemia, unspecified: Secondary | ICD-10-CM | POA: Diagnosis not present

## 2022-06-29 LAB — COMPREHENSIVE METABOLIC PANEL
ALT: 34 U/L (ref 0–35)
AST: 22 U/L (ref 0–37)
Albumin: 3.6 g/dL (ref 3.5–5.2)
Alkaline Phosphatase: 93 U/L (ref 39–117)
BUN: 8 mg/dL (ref 6–23)
CO2: 29 mEq/L (ref 19–32)
Calcium: 8.9 mg/dL (ref 8.4–10.5)
Chloride: 105 mEq/L (ref 96–112)
Creatinine, Ser: 0.77 mg/dL (ref 0.40–1.20)
GFR: 79.77 mL/min (ref >60.00)
Glucose, Bld: 76 mg/dL (ref 70–99)
Potassium: 3.7 mEq/L (ref 3.5–5.1)
Sodium: 141 mEq/L (ref 135–145)
Total Bilirubin: 0.5 mg/dL (ref 0.2–1.2)
Total Protein: 6.2 g/dL (ref 6.0–8.3)

## 2022-06-29 LAB — TSH: TSH: 0.65 u[IU]/mL (ref 0.35–5.50)

## 2022-06-29 LAB — MICROALBUMIN / CREATININE URINE RATIO
Creatinine,U: 84.7 mg/dL
Microalb Creat Ratio: 0.8 mg/g (ref 0.0–30.0)
Microalb, Ur: <0.7 mg/dL (ref 0.0–1.9)

## 2022-06-29 LAB — HEMOGLOBIN A1C: Hgb A1c MFr Bld: 6.4 % (ref 4.6–6.5)

## 2022-06-30 ENCOUNTER — Other Ambulatory Visit: Payer: Self-pay | Admitting: Internal Medicine

## 2022-06-30 LAB — LIPID PANEL W/REFLEX DIRECT LDL
Cholesterol: 158 mg/dL (ref <200)
HDL: 86 mg/dL (ref >=50)
LDL Cholesterol (Calc): 59 mg/dL (calc)
Non-HDL Cholesterol (Calc): 72 mg/dL (calc) (ref <130)
Total CHOL/HDL Ratio: 1.8 (calc) (ref <5.0)
Triglycerides: 54 mg/dL (ref <150)

## 2022-07-01 MED ORDER — PREDNISONE 10 MG PO TABS: ORAL_TABLET | ORAL | 0 refills | Status: DC

## 2022-07-04 ENCOUNTER — Encounter: Payer: Self-pay | Admitting: Internal Medicine

## 2022-07-04 ENCOUNTER — Ambulatory Visit (INDEPENDENT_AMBULATORY_CARE_PROVIDER_SITE_OTHER): Payer: Medicare Other | Admitting: Internal Medicine

## 2022-07-04 VITALS — BP 124/78 | HR 70 | Temp 98.1°F | Ht 65.0 in | Wt 177.0 lb

## 2022-07-04 DIAGNOSIS — I700 Atherosclerosis of aorta: Secondary | ICD-10-CM

## 2022-07-04 DIAGNOSIS — F322 Major depressive disorder, single episode, severe without psychotic features: Secondary | ICD-10-CM

## 2022-07-04 DIAGNOSIS — Z9884 Bariatric surgery status: Secondary | ICD-10-CM

## 2022-07-04 DIAGNOSIS — M47812 Spondylosis without myelopathy or radiculopathy, cervical region: Secondary | ICD-10-CM

## 2022-07-04 DIAGNOSIS — D509 Iron deficiency anemia, unspecified: Secondary | ICD-10-CM | POA: Diagnosis not present

## 2022-07-04 DIAGNOSIS — M62838 Other muscle spasm: Secondary | ICD-10-CM | POA: Diagnosis not present

## 2022-07-04 MED ORDER — CYCLOBENZAPRINE HCL 5 MG PO TABS: 5.0000 mg | ORAL_TABLET | Freq: Every day | ORAL | 1 refills | Status: DC

## 2022-07-04 NOTE — Patient Instructions (Addendum)
Your weight gain is slight but your  A1c has risen to 6.4  ! (Your post op "personal best " weight  was 146; diabetes is diagnosed at an A1c of 6.5)   It's Time to reduce your carbohydrates and calories:  [employer] "low carb power bowl"  entrees and  "Steamer" entrees are are great low carb entrees that microwave in 5 minutes    Veggies Made Great ! Makes bite size  low carb spinach & egg white frittatas     DOn't forget that you can still drink a  Premier Protein shake in the morning.  It is less $$$ and very low sugar.    Optavia  diet is well designed and effective in lowering weight, A1c and cholesterol

## 2022-07-04 NOTE — Progress Notes (Unsigned)
Subjective:  Patient ID: Sherri Lee, female    DOB: 11/24/1955  Age: 67 y.o. MRN: [id number]  CC: There were no encounter diagnoses.   HPI Sherri Lee presents for  Chief Complaint  Patient presents with   Medical Management of Chronic Issues    3 month follow up    Polypharmacy:  receiving lorazepam 1 mg #75/month and zolpidem 10 mg #30/month from her psychiatrist.  Sherri Lee in [place] .  Receiving gabapentin 100 mg #90/month and tramadol 50 mg #60/month from me and flexeril 5 mg #90/month  She suffers from anxiety and requires multiple medications to induce sleep ."I've always been this way."  Stressors include  a 1) stormy relationship  with boyfriend of  5 years.  Wants to go to couples counselling , can't find anyone covered by [employer] , recommending Oscar [facility]  2) care of mother:  Had to move Mother to new facility ,  still dealing with her mother's insistence on living alone. Mother is 66.  Patient is her mothers's medical and financial POA, feels overwhelmed at times.  Father has remarried foR the 3rd time .    Chronic pain :  back pain . Using tramadol and flexeril for neck muscle and shoulder pain aggravated by moving boxes and furniture   Obesity and prediabetes:  recent increase in A1c to 6.4 last week.  Drinks soft drinks. [employer].  Has recently switched to sparkling ICE      Outpatient Medications Prior to Visit  Medication Sig Dispense Refill   acyclovir (ZOVIRAX) 400 MG tablet Take 1 tablet (400 mg total) by mouth 5 (five) times daily. 35 tablet 3   albuterol (VENTOLIN HFA) 108 (90 Base) MCG/ACT inhaler TAKE 2 PUFFS BY MOUTH EVERY 6 HOURS AS NEEDED FOR WHEEZE OR SHORTNESS OF BREATH 8.5 each 2   amLODipine (NORVASC) 5 MG tablet TAKE 1 TABLET (5 MG TOTAL) BY MOUTH DAILY. 90 tablet 1   atorvastatin (LIPITOR) 20 MG tablet TAKE 1 TABLET BY MOUTH EVERY DAY 90 tablet 3   calcium carbonate (OS-CAL) 600 MG TABS Take 600 mg  by mouth 2 (two) times daily with a meal.     cyclobenzaprine (FLEXERIL) 5 MG tablet Take 1 tablet (5 mg total) by mouth at bedtime. 90 tablet 1   diclofenac Sodium (VOLTAREN) 1 % GEL Apply 2 g topically 4 (four) times daily. 100 g G   escitalopram (LEXAPRO) 20 MG tablet Take 20 mg by mouth daily.      estradiol (ESTRACE) 0.1 MG/GM vaginal cream Place vaginally.     fluticasone (FLONASE) 50 MCG/ACT nasal spray Place 2 sprays into both nostrils daily as needed for allergies or rhinitis. After nasal saline 48 mL 5   furosemide (LASIX) 20 MG tablet TAKE 1 TABLET (20 MG TOTAL) BY MOUTH AS NEEDED FOR EDEMA. 20 tablet 1   gabapentin (NEURONTIN) 100 MG capsule TAKE 1 CAPSULE (100 MG TOTAL) BY MOUTH THREE TIMES DAILY. 270 capsule 3   latanoprost (XALATAN) 0.005 % ophthalmic solution 1 drop at bedtime.     LORazepam (ATIVAN) 1 MG tablet TAKE 1 TABLET BY MOUTH TWICE A DAY AS DIRECTED FOR ANXIETY/PANIC  2   metoprolol succinate (TOPROL-XL) 50 MG 24 hr tablet TAKE 0.5 TABLETS (25 MG TOTAL) BY MOUTH DAILY. TAKE WITH OR IMMEDIATELY FOLLOWING A MEAL. 45 tablet 3   Multiple Vitamin (MULTIVITAMIN) tablet Take 1 tablet by mouth daily. Bariatric Vitamin     oxybutynin (DITROPAN-XL) 10 MG 24  hr tablet TAKE 1 TABLET BY MOUTH EVERYDAY AT BEDTIME 90 tablet 1   pantoprazole (PROTONIX) 40 MG tablet TAKE 1 TABLET BY MOUTH EVERY DAY 90 tablet 3   potassium chloride SA (KLOR-CON M) 20 MEQ tablet Take 1 tablet (20 mEq total) by mouth 2 (two) times daily. 10 tablet 0   predniSONE (DELTASONE) 10 MG tablet 6 tablets daily for 3 days, then reduce by 1 tablet daily until gone 33 tablet 0   traMADol (ULTRAM) 50 MG tablet Take 1 tablet (50 mg total) by mouth every 12 (twelve) hours as needed. 60 tablet 4   VITAMIN D PO Take by mouth.     zolpidem (AMBIEN) 10 MG tablet Take 10 mg by mouth at bedtime as needed for sleep.     No facility-administered medications prior to visit.    Review of Systems;  Patient denies headache,  fevers, malaise, unintentional weight loss, skin rash, eye pain, sinus congestion and sinus pain, sore throat, dysphagia,  hemoptysis , cough, dyspnea, wheezing, chest pain, palpitations, orthopnea, edema, abdominal pain, nausea, melena, diarrhea, constipation, flank pain, dysuria, hematuria, urinary  Frequency, nocturia, numbness, tingling, seizures,  Focal weakness, Loss of consciousness,  Tremor, insomnia, depression, anxiety, and suicidal ideation.      Objective:  BP 124/78   Pulse 70   Temp 98.1 F (36.7 C) (Oral)   Ht  (1.651 m)   Wt 177 lb (80.3 kg)   SpO2 98%   BMI 29.45 kg/m   BP Readings from Last 3 Encounters:  07/04/22 124/78  05/13/22 124/76  04/04/22 124/78    Wt Readings from Last 3 Encounters:  07/04/22 177 lb (80.3 kg)  05/13/22 174 lb 9.6 oz (79.2 kg)  04/28/22 174 lb (78.9 kg)    Physical Exam  Lab Results  Component Value Date   HGBA1C 6.4 06/29/2022   HGBA1C 5.9 (A) 12/31/2021   HGBA1C 6.4 09/01/2020    Lab Results  Component Value Date   CREATININE 0.77 06/29/2022   CREATININE 0.72 07/30/2021   CREATININE 0.69 07/06/2021    Lab Results  Component Value Date   WBC 6.4 07/06/2021   HGB 11.3 (L) 07/06/2021   HCT 35.0 (L) 07/06/2021   PLT 199.0 07/06/2021   GLUCOSE 76 06/29/2022   CHOL 158 06/29/2022   TRIG 54 06/29/2022   HDL 86 06/29/2022   LDLDIRECT 99.0 09/04/2015   LDLCALC 59 06/29/2022   ALT 34 06/29/2022   AST 22 06/29/2022   NA 141 06/29/2022   K 3.7 06/29/2022   CL 105 06/29/2022   CREATININE 0.77 06/29/2022   BUN 8 06/29/2022   CO2 29 06/29/2022   TSH 0.65 06/29/2022   HGBA1C 6.4 06/29/2022   MICROALBUR <0.7 06/29/2022    No results found.  Assessment & Plan:  .There are no diagnoses linked to this encounter.   I provided 30 minutes of face-to-face time during this encounter reviewing patient's last visit with me, patient's  most recent visit with cardiology,  nephrology,  and neurology,  recent surgical and  non surgical procedures, previous  labs and imaging studies, counseling on currently addressed issues,  and post visit ordering to diagnostics and therapeutics .   Follow-up: No follow-ups on file.   [name], MD

## 2022-07-05 ENCOUNTER — Encounter: Payer: Self-pay | Admitting: Internal Medicine

## 2022-07-05 NOTE — Assessment & Plan Note (Signed)
Resolved by last check  secondary to  bariatric surgery and use of PPI

## 2022-07-05 NOTE — Assessment & Plan Note (Addendum)
Repeating prednisone taper for continued symptoms. Refilling flexeril as well.  Tramadol Refill history confirmed via [place] Controlled Substance databas, accessed by me today.Marland Kitchen

## 2022-07-05 NOTE — Assessment & Plan Note (Signed)
I have addressed  BMI and recommended wt loss of 10% of body weigh over the next 6 months using a low glycemic index diet and encouraged her to find a tolerabel exercise activity  a minimum of 5 days per week.  Her ersonal best was 149 lbs after bariatric surgery but she has been gaining weight steadily  for the  past 2 years

## 2022-09-13 ENCOUNTER — Telehealth: Payer: Self-pay

## 2022-09-13 ENCOUNTER — Telehealth: Payer: Self-pay | Admitting: Internal Medicine

## 2022-09-13 DIAGNOSIS — Z1211 Encounter for screening for malignant neoplasm of colon: Secondary | ICD-10-CM

## 2022-09-13 NOTE — Telephone Encounter (Signed)
[facility] is asking for an updated referral with the provider and their facility on it.

## 2022-09-13 NOTE — Telephone Encounter (Signed)
Patient requesting a referral to Dr. Servando Snare for colonoscopy. I do not see a referral.

## 2022-09-13 NOTE — Telephone Encounter (Signed)
Returned call no answers mail box full unable to leave message

## 2022-09-13 NOTE — Telephone Encounter (Signed)
Referral has been placed. 

## 2022-09-14 ENCOUNTER — Telehealth: Payer: Self-pay

## 2022-09-14 ENCOUNTER — Other Ambulatory Visit: Payer: Self-pay

## 2022-09-14 ENCOUNTER — Encounter: Payer: Self-pay | Admitting: Oncology

## 2022-09-14 DIAGNOSIS — Z1211 Encounter for screening for malignant neoplasm of colon: Secondary | ICD-10-CM

## 2022-09-14 MED ORDER — NA SULFATE-K SULFATE-MG SULF 17.5-3.13-1.6 GM/177ML PO SOLN: 1.0000 | Freq: Once | ORAL | 0 refills | Status: AC

## 2022-09-14 NOTE — Telephone Encounter (Signed)
Gastroenterology Pre-Procedure Review  Request Date: 11/08/22 Requesting Physician: Dr. Servando Snare  PATIENT REVIEW QUESTIONS: The patient responded to the following health history questions as indicated:    1. Are you having any GI issues? no 2. Do you have a personal history of Polyps? no 3. Do you have a family history of Colon Cancer or Polyps? no 4. Diabetes Mellitus? no 5. Joint replacements in the past 12 months?no 6. Major health problems in the past 3 months?no 7. Any artificial heart valves, MVP, or defibrillator?no    MEDICATIONS & ALLERGIES:    Patient reports the following regarding taking any anticoagulation/antiplatelet therapy:   Plavix, Coumadin, Eliquis, Xarelto, Lovenox, Pradaxa, Brilinta, or Effient? no Aspirin? no  Patient confirms/reports the following medications:  Current Outpatient Medications  Medication Sig Dispense Refill   acyclovir (ZOVIRAX) 400 MG tablet Take 1 tablet (400 mg total) by mouth 5 (five) times daily. 35 tablet 3   albuterol (VENTOLIN HFA) 108 (90 Base) MCG/ACT inhaler TAKE 2 PUFFS BY MOUTH EVERY 6 HOURS AS NEEDED FOR WHEEZE OR SHORTNESS OF BREATH 8.5 each 2   amLODipine (NORVASC) 5 MG tablet TAKE 1 TABLET (5 MG TOTAL) BY MOUTH DAILY. 90 tablet 1   atorvastatin (LIPITOR) 20 MG tablet TAKE 1 TABLET BY MOUTH EVERY DAY 90 tablet 3   calcium carbonate (OS-CAL) 600 MG TABS Take 600 mg by mouth 2 (two) times daily with a meal.     cyclobenzaprine (FLEXERIL) 5 MG tablet Take 1 tablet (5 mg total) by mouth at bedtime. 90 tablet 1   diclofenac Sodium (VOLTAREN) 1 % GEL Apply 2 g topically 4 (four) times daily. 100 g G   escitalopram (LEXAPRO) 20 MG tablet Take 20 mg by mouth daily.      estradiol (ESTRACE) 0.1 MG/GM vaginal cream Place vaginally.     fluticasone (FLONASE) 50 MCG/ACT nasal spray Place 2 sprays into both nostrils daily as needed for allergies or rhinitis. After nasal saline 48 mL 5   furosemide (LASIX) 20 MG tablet TAKE 1 TABLET (20 MG TOTAL)  BY MOUTH AS NEEDED FOR EDEMA. 20 tablet 1   gabapentin (NEURONTIN) 100 MG capsule TAKE 1 CAPSULE (100 MG TOTAL) BY MOUTH THREE TIMES DAILY. 270 capsule 3   latanoprost (XALATAN) 0.005 % ophthalmic solution 1 drop at bedtime.     LORazepam (ATIVAN) 1 MG tablet TAKE 1 TABLET BY MOUTH TWICE A DAY AS DIRECTED FOR ANXIETY/PANIC  2   metoprolol succinate (TOPROL-XL) 50 MG 24 hr tablet TAKE 0.5 TABLETS (25 MG TOTAL) BY MOUTH DAILY. TAKE WITH OR IMMEDIATELY FOLLOWING A MEAL. 45 tablet 3   Multiple Vitamin (MULTIVITAMIN) tablet Take 1 tablet by mouth daily. Bariatric Vitamin     oxybutynin (DITROPAN-XL) 10 MG 24 hr tablet TAKE 1 TABLET BY MOUTH EVERYDAY AT BEDTIME 90 tablet 1   pantoprazole (PROTONIX) 40 MG tablet TAKE 1 TABLET BY MOUTH EVERY DAY 90 tablet 3   potassium chloride SA (KLOR-CON M) 20 MEQ tablet Take 1 tablet (20 mEq total) by mouth 2 (two) times daily. 10 tablet 0   predniSONE (DELTASONE) 10 MG tablet 6 tablets daily for 3 days, then reduce by 1 tablet daily until gone 33 tablet 0   traMADol (ULTRAM) 50 MG tablet Take 1 tablet (50 mg total) by mouth every 12 (twelve) hours as needed. 60 tablet 4   VITAMIN D PO Take by mouth.     zolpidem (AMBIEN) 10 MG tablet Take 10 mg by mouth at bedtime as needed for  sleep.     No current facility-administered medications for this visit.    Patient confirms/reports the following allergies:  Allergies  Allergen Reactions   Bacitracin-Polymyxin B Rash   Egg-Derived Products    Betadine [Povidone Iodine] Rash    No orders of the defined types were placed in this encounter.   AUTHORIZATION INFORMATION Primary Insurance: 1D#: Group #:  Secondary Insurance: 1D#: Group #:  SCHEDULE INFORMATION: Date: 11/08/22 Time: Location: [facility]

## 2022-09-21 DIAGNOSIS — H16223 Keratoconjunctivitis sicca, not specified as Sjogren's, bilateral: Secondary | ICD-10-CM | POA: Diagnosis not present

## 2022-09-21 DIAGNOSIS — H43811 Vitreous degeneration, right eye: Secondary | ICD-10-CM | POA: Diagnosis not present

## 2022-09-21 DIAGNOSIS — H401131 Primary open-angle glaucoma, bilateral, mild stage: Secondary | ICD-10-CM | POA: Diagnosis not present

## 2022-09-21 DIAGNOSIS — H26491 Other secondary cataract, right eye: Secondary | ICD-10-CM | POA: Diagnosis not present

## 2022-10-03 ENCOUNTER — Encounter: Payer: Self-pay | Admitting: Internal Medicine

## 2022-10-03 ENCOUNTER — Telehealth: Payer: Medicare Other | Admitting: Internal Medicine

## 2022-10-03 VITALS — Ht 65.0 in | Wt 177.0 lb

## 2022-10-03 DIAGNOSIS — Z8719 Personal history of other diseases of the digestive system: Secondary | ICD-10-CM | POA: Diagnosis not present

## 2022-10-03 DIAGNOSIS — K222 Esophageal obstruction: Secondary | ICD-10-CM | POA: Diagnosis not present

## 2022-10-03 DIAGNOSIS — R1110 Vomiting, unspecified: Secondary | ICD-10-CM

## 2022-10-03 DIAGNOSIS — Z9884 Bariatric surgery status: Secondary | ICD-10-CM

## 2022-10-03 DIAGNOSIS — F458 Other somatoform disorders: Secondary | ICD-10-CM

## 2022-10-03 NOTE — Progress Notes (Unsigned)
Virtual Visit via Caregility   Note   This format is felt to be most appropriate for this patient at this time.  All issues noted in this document were discussed and addressed.  No physical exam was performed (except for noted visual exam findings with Video Visits).   I connected with [name] on 10/03/22 at  4:00 PM EDT by a video enabled telemedicine application  and verified that I am speaking with the correct person using two identifiers. Location patient: home Location provider: work or home office Persons participating in the virtual visit: patient, provider  I discussed the limitations, risks, security and privacy concerns of performing an evaluation and management service by telephone and the availability of in person appointments. I also discussed with the patient that there may be a patient responsible charge related to this service. The patient expressed understanding and agreed to proceed.  Reason for visit: post prandial regurgitation  HPI: 67 yr old female with history of lower esophageal stricture, last EGD/dilation 2019 by Lynnae Prude ,  presents with return of symptoms.  For the past several months she has had  lower esophageal dysphagia and  post prandial regurgitation.  She has a remote history of bariatric surgery, (Roux en Y 2014).  She is taking her PPI. She has not had unintentional weight loss and does not fear eating.  The symptoms are not elicited by liquids, and she states that she is eating very small amounts of food,  but has not availed herself to commercial protein shakes.  She reports increased emotional stress due to her responsibilities checking on both parents who a live independently but require increased supervision due to frailty and dementia. She  feels overwhelmed and unsupported by her two siblings who have not retired and do not share the responsibility of caring for their parents.   ROS: See pertinent positives and negatives per HPI.  Past Medical  History:  Diagnosis Date   Anemia    Low iron and hemoglobin   Anginal pain (HCC)    states she thinks it is stress   Anxiety    Asthma    COVID-19    07/05/20, 03/2021   Depression    Dysrhythmia    states she has PVC's   Fibromyalgia    Hypertension    Recurrent vomiting 02/04/2018   Schatzki's ring    dialated 3 times by Markham Jordan     Past Surgical History:  Procedure Laterality Date   ABDOMINAL ADHESION SURGERY     BUNIONECTOMY     left foot    CERVICAL FUSION     CESAREAN SECTION     CHOLECYSTECTOMY  June 2013   GASTRIC BYPASS      Family History  Problem Relation Age of Onset   Hypertension Mother    Glaucoma Mother    Heart disease Father    Stroke Father    Breast cancer Maternal Aunt    Cancer Maternal Aunt    Colon cancer Neg Hx    Ovarian cancer Neg Hx     SOCIAL HX:  reports that she has never smoked. She has never used smokeless tobacco. She reports current alcohol use of about 3.0 standard drinks of alcohol per week. She reports that she does not use drugs.   Current Outpatient Medications:    acyclovir (ZOVIRAX) 400 MG tablet, Take 1 tablet (400 mg total) by mouth 5 (five) times daily., Disp: 35 tablet, Rfl: 3   albuterol (VENTOLIN HFA) 108 (90 Base)  MCG/ACT inhaler, TAKE 2 PUFFS BY MOUTH EVERY 6 HOURS AS NEEDED FOR WHEEZE OR SHORTNESS OF BREATH, Disp: 8.5 each, Rfl: 2   amLODipine (NORVASC) 5 MG tablet, TAKE 1 TABLET (5 MG TOTAL) BY MOUTH DAILY., Disp: 90 tablet, Rfl: 1   atorvastatin (LIPITOR) 20 MG tablet, TAKE 1 TABLET BY MOUTH EVERY DAY, Disp: 90 tablet, Rfl: 3   calcium carbonate (OS-CAL) 600 MG TABS, Take 600 mg by mouth 2 (two) times daily with a meal., Disp: , Rfl:    cyclobenzaprine (FLEXERIL) 5 MG tablet, Take 1 tablet (5 mg total) by mouth at bedtime., Disp: 90 tablet, Rfl: 1   diclofenac Sodium (VOLTAREN) 1 % GEL, Apply 2 g topically 4 (four) times daily., Disp: 100 g, Rfl: G   escitalopram (LEXAPRO) 20 MG tablet, Take 20 mg by mouth daily.  , Disp: , Rfl:    estradiol (ESTRACE) 0.1 MG/GM vaginal cream, Place vaginally., Disp: , Rfl:    fluticasone (FLONASE) 50 MCG/ACT nasal spray, Place 2 sprays into both nostrils daily as needed for allergies or rhinitis. After nasal saline, Disp: 48 mL, Rfl: 5   furosemide (LASIX) 20 MG tablet, TAKE 1 TABLET (20 MG TOTAL) BY MOUTH AS NEEDED FOR EDEMA., Disp: 20 tablet, Rfl: 1   gabapentin (NEURONTIN) 100 MG capsule, TAKE 1 CAPSULE (100 MG TOTAL) BY MOUTH THREE TIMES DAILY., Disp: 270 capsule, Rfl: 3   latanoprost (XALATAN) 0.005 % ophthalmic solution, 1 drop at bedtime., Disp: , Rfl:    LORazepam (ATIVAN) 1 MG tablet, TAKE 1 TABLET BY MOUTH TWICE A DAY AS DIRECTED FOR ANXIETY/PANIC, Disp: , Rfl: 2   metoprolol succinate (TOPROL-XL) 50 MG 24 hr tablet, TAKE 0.5 TABLETS (25 MG TOTAL) BY MOUTH DAILY. TAKE WITH OR IMMEDIATELY FOLLOWING A MEAL., Disp: 45 tablet, Rfl: 3   Multiple Vitamin (MULTIVITAMIN) tablet, Take 1 tablet by mouth daily. Bariatric Vitamin, Disp: , Rfl:    oxybutynin (DITROPAN-XL) 10 MG 24 hr tablet, TAKE 1 TABLET BY MOUTH EVERYDAY AT BEDTIME, Disp: 90 tablet, Rfl: 1   pantoprazole (PROTONIX) 40 MG tablet, TAKE 1 TABLET BY MOUTH EVERY DAY, Disp: 90 tablet, Rfl: 3   potassium chloride SA (KLOR-CON M) 20 MEQ tablet, Take 1 tablet (20 mEq total) by mouth 2 (two) times daily., Disp: 10 tablet, Rfl: 0   traMADol (ULTRAM) 50 MG tablet, Take 1 tablet (50 mg total) by mouth every 12 (twelve) hours as needed., Disp: 60 tablet, Rfl: 4   zolpidem (AMBIEN) 10 MG tablet, Take 10 mg by mouth at bedtime as needed for sleep., Disp: , Rfl:    predniSONE (DELTASONE) 10 MG tablet, 6 tablets daily for 3 days, then reduce by 1 tablet daily until gone (Patient not taking: Reported on 10/03/2022), Disp: 33 tablet, Rfl: 0   VITAMIN D PO, Take by mouth. (Patient not taking: Reported on 10/03/2022), Disp: , Rfl:   EXAM:  VITALS per patient if applicable:  GENERAL: alert, oriented, appears well and in no acute  distress  HEENT: atraumatic, conjunttiva clear, no obvious abnormalities on inspection of external nose and ears  NECK: normal movements of the head and neck  LUNGS: on inspection no signs of respiratory distress, breathing rate appears normal, no obvious gross SOB, gasping or wheezing  CV: no obvious cyanosis  MS: moves all visible extremities without noticeable abnormality  PSYCH/NEURO: pleasant and cooperative, no obvious depression or anxiety, speech and thought processing grossly intact  ASSESSMENT AND PLAN: History of esophageal stricture -  Ambulatory referral to Gastroenterology -     DG ESOPHAGUS W SINGLE CM (SOL OR THIN BA); Future  Status post gastric bypass for obesity Assessment & Plan: Done in 2014,  readched nadir weight of 147 lbs in 2017.  With regain of 30 lbs since then    Esophageal stricture Assessment & Plan: Recurrent by history.  Her last EGD/dilation in 2019 by Lynnae Prude,   in the past her stricture recurred  due in part to previous nonadherence to  PPI therapy .  She reports compliance since Sept 2023.   Referral to Dr Servando Snare for EGD in progress.   She has a colonoscopy scheduled for late August    Regurgitation of stomach contents -     DG ESOPHAGUS W SINGLE CM (SOL OR THIN BA); Future  Functional dysphagia -     DG ESOPHAGUS W SINGLE CM (SOL OR THIN BA); Future      I discussed the assessment and treatment plan with the patient. The patient was provided an opportunity to ask questions and all were answered. The patient agreed with the plan and demonstrated an understanding of the instructions.   The patient was advised to call back or seek an in-person evaluation if the symptoms worsen or if the condition fails to improve as anticipated.   I spent 30 minutes dedicated to the care of this patient on the date of this encounter to include pre-visit review of his medical history,  Face-to-face time with the patient , and post visit ordering of  testing and therapeutics.    [name], MD

## 2022-10-03 NOTE — Assessment & Plan Note (Addendum)
Recurrent by history.  Her last EGD/dilation in 2019 by Lynnae Prude,   in the past her stricture recurred  due in part to previous nonadherence to  PPI therapy .  She reports compliance since Sept 2023.   Referral to Dr Servando Snare for EGD in progress.   She has a colonoscopy scheduled for late August

## 2022-10-03 NOTE — Assessment & Plan Note (Signed)
Done in 2014,  readched nadir weight of 147 lbs in 2017.  With regain of 30 lbs since then

## 2022-10-04 ENCOUNTER — Telehealth: Payer: Self-pay

## 2022-10-04 NOTE — Telephone Encounter (Signed)
Left message on voicemail.

## 2022-10-04 NOTE — Telephone Encounter (Signed)
-----   Message from Midge Minium sent at 10/04/2022  5:45 AM EDT ----- Regarding: FW: referral for EGD ----- Message ----- From: [name], MD Sent: 10/04/2022   5:42 AM EDT To: Midge Minium, MD Subject: referral for EGD                               Francetta Found is scheduled for a colonoscopy with  you in  late August.  However, She needs to have an EGD as well because of dysphagia for solids.  She has a  history of recurrent esophageal stricture.  Last dilation in 2019 by Lynnae Prude .  I have ordered DG esophagus.    Thanks  Rosey Bath

## 2022-10-06 NOTE — Telephone Encounter (Signed)
Pt is aware of the add on, however there was not enough time to add another procedure on original day... Pt r/s for 9/3 and she is aware  I called endo to have her moved and to add EGD  Also, PPW corrected and released to Mychart and mailed to pt

## 2022-10-26 ENCOUNTER — Ambulatory Visit: Payer: Self-pay

## 2022-10-26 NOTE — Patient Outreach (Signed)
  Care Coordination   Initial Visit Note   10/26/2022 Name: Sherri Lee MRN: [id number] DOB: 1956-01-29  Sherri Lee is a 66 y.o. year old female who sees Darrick Huntsman, Mar Daring, MD for primary care. I spoke with  Sherri Lee by phone today.  What matters to the patients health and wellness today?  CM educated patient on [facility] services.  Patient declines services and feels that she is able to manage her medical needs.  Patient agreed to contact her provider in the future if she needs [facility] services.    Goals Addressed   None     SDOH assessments and interventions completed:  No     Care Coordination Interventions:  Yes, provided   Interventions Today    Flowsheet Row Most Recent Value  General Interventions   General Interventions Discussed/Reviewed General Interventions Discussed, Doctor Visits  [She is managing food, transportation and medication well.]  Doctor Visits Discussed/Reviewed Doctor Visits Discussed  [Feels she could be doing better with medical care and encouraged to schedule with her doctor.  Last visit in April 2024]  Education Interventions   Education Provided Provided Education  [Educated on [facility]]  Provided Verbal Education On Other        Follow up plan: No further intervention required.   Encounter Outcome:  Pt. Visit Completed

## 2022-11-07 ENCOUNTER — Encounter: Payer: Self-pay | Admitting: Gastroenterology

## 2022-11-11 ENCOUNTER — Other Ambulatory Visit: Payer: Self-pay | Admitting: Internal Medicine

## 2022-11-11 ENCOUNTER — Encounter: Payer: Self-pay | Admitting: Gastroenterology

## 2022-11-11 DIAGNOSIS — M5134 Other intervertebral disc degeneration, thoracic region: Secondary | ICD-10-CM

## 2022-11-11 DIAGNOSIS — M5030 Other cervical disc degeneration, unspecified cervical region: Secondary | ICD-10-CM

## 2022-11-11 NOTE — Telephone Encounter (Signed)
LOV: 10/03/22  NOV: N/A

## 2022-11-11 NOTE — Telephone Encounter (Signed)
Prescription Request  11/11/2022  LOV: 07/04/2022  What is the name of the medication or equipment? traMADol (ULTRAM) 50 MG tablet    Have you contacted your pharmacy to request a refill? Yes   Which pharmacy would you like this sent to?  [id number] [place] [address], [place] - [address] [place] [address] Phone: [phone] Fax: [phone]    Patient notified that their request is being sent to the clinical staff for review and that they should receive a response within 2 business days.   Please advise at [facility] [phone]

## 2022-11-15 ENCOUNTER — Ambulatory Visit
Admission: RE | Admit: 2022-11-15 | Discharge: 2022-11-15 | Disposition: A | Discharge status: Home / Self Care | Payer: Medicare Other | Attending: Gastroenterology | Admitting: Gastroenterology

## 2022-11-15 ENCOUNTER — Encounter: Payer: Self-pay | Admitting: Gastroenterology

## 2022-11-15 ENCOUNTER — Ambulatory Visit: Payer: Medicare Other | Admitting: Certified Registered Nurse Anesthetist

## 2022-11-15 ENCOUNTER — Encounter
Admission: RE | Disposition: A | Discharge status: Home / Self Care | Payer: Self-pay | Source: Home / Self Care | Attending: Gastroenterology

## 2022-11-15 DIAGNOSIS — K648 Other hemorrhoids: Secondary | ICD-10-CM | POA: Diagnosis not present

## 2022-11-15 DIAGNOSIS — Z1211 Encounter for screening for malignant neoplasm of colon: Secondary | ICD-10-CM | POA: Diagnosis not present

## 2022-11-15 DIAGNOSIS — M1990 Unspecified osteoarthritis, unspecified site: Secondary | ICD-10-CM | POA: Diagnosis not present

## 2022-11-15 DIAGNOSIS — K222 Esophageal obstruction: Secondary | ICD-10-CM | POA: Diagnosis not present

## 2022-11-15 DIAGNOSIS — M797 Fibromyalgia: Secondary | ICD-10-CM | POA: Diagnosis not present

## 2022-11-15 DIAGNOSIS — F419 Anxiety disorder, unspecified: Secondary | ICD-10-CM | POA: Diagnosis not present

## 2022-11-15 DIAGNOSIS — R1310 Dysphagia, unspecified: Secondary | ICD-10-CM

## 2022-11-15 DIAGNOSIS — K3189 Other diseases of stomach and duodenum: Secondary | ICD-10-CM

## 2022-11-15 DIAGNOSIS — F32A Depression, unspecified: Secondary | ICD-10-CM | POA: Insufficient documentation

## 2022-11-15 DIAGNOSIS — I10 Essential (primary) hypertension: Secondary | ICD-10-CM | POA: Diagnosis not present

## 2022-11-15 DIAGNOSIS — K640 First degree hemorrhoids: Secondary | ICD-10-CM | POA: Insufficient documentation

## 2022-11-15 HISTORY — PX: BALLOON DILATION: SHX5330

## 2022-11-15 HISTORY — PX: COLONOSCOPY WITH PROPOFOL: SHX5780

## 2022-11-15 HISTORY — PX: ESOPHAGOGASTRODUODENOSCOPY (EGD) WITH PROPOFOL: SHX5813

## 2022-11-15 SURGERY — COLONOSCOPY WITH PROPOFOL
Anesthesia: General

## 2022-11-15 MED ORDER — PROPOFOL 500 MG/50ML IV EMUL
INTRAVENOUS | Status: DC | PRN
  Administered 2022-11-15: 170 ug/kg/min via INTRAVENOUS

## 2022-11-15 MED ORDER — LIDOCAINE HCL (CARDIAC) PF 100 MG/5ML IV SOSY
PREFILLED_SYRINGE | INTRAVENOUS | Status: DC | PRN
  Administered 2022-11-15: 100 mg via INTRAVENOUS

## 2022-11-15 MED ORDER — SODIUM CHLORIDE 0.9 % IV SOLN: INTRAVENOUS | Status: DC

## 2022-11-15 MED ORDER — PROPOFOL 10 MG/ML IV BOLUS
INTRAVENOUS | Status: DC | PRN
  Administered 2022-11-15: 50 mg via INTRAVENOUS

## 2022-11-15 NOTE — Op Note (Signed)
[facility] Gastroenterology Patient Name: Silje Kuhne Procedure Date: 11/15/2022 10:10 AM MRN: [id number] Account #: [id number] Date of Birth: 1956/03/03 Admit Type: Outpatient Age: 67 Room: [facility] ENDO ROOM 4 Gender: Female Note Status: Finalized Instrument Name: Upper Endoscope [id number] Procedure:             Upper GI endoscopy Indications:           Dysphagia Providers:             Midge Minium MD, MD Referring MD:          Duncan Dull, MD (Referring MD) Medicines:             Propofol per Anesthesia Complications:         No immediate complications. Procedure:             Pre-Anesthesia Assessment:                        - Prior to the procedure, a History and Physical was                         performed, and patient medications and allergies were                         reviewed. The patient's tolerance of previous                         anesthesia was also reviewed. The risks and benefits                         of the procedure and the sedation options and risks                         were discussed with the patient. All questions were                         answered, and informed consent was obtained. Prior                         Anticoagulants: The patient has taken no anticoagulant                         or antiplatelet agents. ASA Grade Assessment: II - A                         patient with mild systemic disease. After reviewing                         the risks and benefits, the patient was deemed in                         satisfactory condition to undergo the procedure.                        After obtaining informed consent, the endoscope was                         passed under direct vision. Throughout the procedure,  the patient's blood pressure, pulse, and oxygen                         saturations were monitored continuously. The Endoscope                         was introduced through the mouth, and advanced to  the                         second part of duodenum. The upper GI endoscopy was                         accomplished without difficulty. The patient tolerated                         the procedure well. Findings:      One benign-appearing, intrinsic mild stenosis was found. The stenosis       was traversed. A TTS dilator was passed through the scope. Dilation with       a 15-16.5-18 mm balloon dilator was performed to 18 mm. The dilation       site was examined following endoscope reinsertion and showed complete       resolution of luminal narrowing.      A scar was found in the gastric antrum.      The examined duodenum was normal. Impression:            - Benign-appearing esophageal stenosis. Dilated.                        - Scar in the gastric antrum.                        - Normal examined duodenum.                        - No specimens collected. Recommendation:        - Discharge patient to home.                        - Resume previous diet.                        - Continue present medications.                        - Perform a colonoscopy today. Procedure Code(s):     --- Professional ---                        [phone], Esophagogastroduodenoscopy, flexible,                         transoral; with transendoscopic balloon dilation of                         esophagus (less than 30 mm diameter) Diagnosis Code(s):     --- Professional ---                        R13.10, Dysphagia, unspecified  K31.89, Other diseases of stomach and duodenum CPT copyright 2022 American Medical Association. All rights reserved. The codes documented in this report are preliminary and upon coder review may  be revised to meet current compliance requirements. Midge Minium MD, MD 11/15/2022 10:55:32 AM This report has been signed electronically. Number of Addenda: 0 Note Initiated On: 11/15/2022 10:10 AM Estimated Blood Loss:  Estimated blood loss: none.      [facility]

## 2022-11-15 NOTE — Anesthesia Preprocedure Evaluation (Signed)
Anesthesia Evaluation  Patient identified by MRN, date of birth, ID band Patient awake    Reviewed: Allergy & Precautions, NPO status , Patient's Chart, lab work & pertinent test results  History of Anesthesia Complications Negative for: history of anesthetic complications  Airway Mallampati: II  TM Distance: >3 FB Neck ROM: full    Dental no notable dental hx.    Pulmonary asthma    Pulmonary exam normal        Cardiovascular hypertension, On Medications Normal cardiovascular exam+ dysrhythmias (PVCs)      Neuro/Psych  Headaches PSYCHIATRIC DISORDERS Anxiety Depression       GI/Hepatic Neg liver ROS,,, Schatzki's ring   Endo/Other  negative endocrine ROS    Renal/GU negative Renal ROS  negative genitourinary   Musculoskeletal  (+) Arthritis ,  Fibromyalgia -  Abdominal   Peds  Hematology  (+) Blood dyscrasia, anemia   Anesthesia Other Findings Past Medical History: No date: Anemia     Comment:  Low iron and hemoglobin No date: Anginal pain (HCC)     Comment:  states she thinks it is stress No date: Anxiety No date: Asthma No date: COVID-19     Comment:  07/05/20, 03/2021 No date: Depression No date: Dysrhythmia     Comment:  states she has PVC's No date: Fibromyalgia No date: Hypertension 02/04/2018: Recurrent vomiting No date: Schatzki's ring     Comment:  dialated 3 times by Markham Jordan   Past Surgical History: No date: ABDOMINAL ADHESION SURGERY No date: BUNIONECTOMY     Comment:  left foot  No date: CERVICAL FUSION No date: CESAREAN SECTION June 2013: CHOLECYSTECTOMY No date: GASTRIC BYPASS     Reproductive/Obstetrics negative OB ROS                             Anesthesia Physical Anesthesia Plan  ASA: 2  Anesthesia Plan: General   Post-op Pain Management: Minimal or no pain anticipated   Induction: Intravenous  PONV Risk Score and Plan: 2 and Propofol  infusion and TIVA  Airway Management Planned: Natural Airway and Nasal Cannula  Additional Equipment:   Intra-op Plan:   Post-operative Plan:   Informed Consent: I have reviewed the patients History and Physical, chart, labs and discussed the procedure including the risks, benefits and alternatives for the proposed anesthesia with the patient or authorized representative who has indicated his/her understanding and acceptance.     Dental Advisory Given  Plan Discussed with: Anesthesiologist, CRNA and Surgeon  Anesthesia Plan Comments: (Patient consented for risks of anesthesia including but not limited to:  - adverse reactions to medications - risk of airway placement if required - damage to eyes, teeth, lips or other oral mucosa - nerve damage due to positioning  - sore throat or hoarseness - Damage to heart, brain, nerves, lungs, other parts of body or loss of life  Patient voiced understanding.)       Anesthesia Quick Evaluation

## 2022-11-15 NOTE — Transfer of Care (Signed)
Immediate Anesthesia Transfer of Care Note  Patient: Sherri Lee  Procedure(s) Performed: COLONOSCOPY WITH PROPOFOL ESOPHAGOGASTRODUODENOSCOPY (EGD) WITH PROPOFOL BALLOON DILATION  Patient Location: PACU  Anesthesia Type:General  Level of Consciousness: drowsy  Airway & Oxygen Therapy: Patient Spontanous Breathing and Patient connected to nasal cannula oxygen  Post-op Assessment: Report given to RN and Post -op Vital signs reviewed and stable  Post vital signs: Reviewed and stable  Last Vitals:  Vitals Value Taken Time  BP    Temp    Pulse 96 11/15/22 1113  Resp 18 11/15/22 1113  SpO2 100 % 11/15/22 1113  Vitals shown include unfiled device data.  Last Pain:  Vitals:   11/15/22 1025  TempSrc: Temporal         Complications: No notable events documented.

## 2022-11-15 NOTE — Op Note (Signed)
[facility] Gastroenterology Patient Name: Sherri Lee Procedure Date: 11/15/2022 10:10 AM MRN: [id number] Account #: [id number] Date of Birth: [birth year] Admit Type: Outpatient Age: 67 Room: [facility] ENDO ROOM 4 Gender: Female Note Status: Finalized Instrument Name: Prentice Docker [id number] Procedure:             Colonoscopy Indications:           Screening for colorectal malignant neoplasm Providers:             Midge Minium MD, MD Referring MD:          Duncan Dull, MD (Referring MD) Medicines:             Propofol per Anesthesia Complications:         No immediate complications. Procedure:             Pre-Anesthesia Assessment:                        - Prior to the procedure, a History and Physical was                         performed, and patient medications and allergies were                         reviewed. The patient's tolerance of previous                         anesthesia was also reviewed. The risks and benefits                         of the procedure and the sedation options and risks                         were discussed with the patient. All questions were                         answered, and informed consent was obtained. Prior                         Anticoagulants: The patient has taken no anticoagulant                         or antiplatelet agents. ASA Grade Assessment: II - A                         patient with mild systemic disease. After reviewing                         the risks and benefits, the patient was deemed in                         satisfactory condition to undergo the procedure.                        After obtaining informed consent, the colonoscope was                         passed under direct vision. Throughout the procedure,  the patient's blood pressure, pulse, and oxygen                         saturations were monitored continuously. The                         Colonoscope was introduced through  the anus and                         advanced to the the cecum, identified by appendiceal                         orifice and ileocecal valve. The colonoscopy was                         performed without difficulty. The patient tolerated                         the procedure well. The quality of the bowel                         preparation was excellent. Findings:      The perianal and digital rectal examinations were normal.      Non-bleeding internal hemorrhoids were found during retroflexion. The       hemorrhoids were Grade I (internal hemorrhoids that do not prolapse). Impression:            - Non-bleeding internal hemorrhoids.                        - No specimens collected. Recommendation:        - Discharge patient to home.                        - Resume previous diet.                        - Continue present medications.                        - Repeat colonoscopy in 10 years for screening                         purposes. Procedure Code(s):     --- Professional ---                        [id number], Colonoscopy, flexible; diagnostic, including                         collection of specimen(s) by brushing or washing, when                         performed (separate procedure) Diagnosis Code(s):     --- Professional ---                        Z12.11, Encounter for screening for malignant neoplasm                         of colon CPT copyright 2022 American Medical Association. All rights reserved. The  codes documented in this report are preliminary and upon coder review may  be revised to meet current compliance requirements. Midge Minium MD, MD 11/15/2022 11:10:39 AM This report has been signed electronically. Number of Addenda: 0 Note Initiated On: 11/15/2022 10:10 AM Scope Withdrawal Time: 0 hours 7 minutes 40 seconds  Total Procedure Duration: 0 hours 11 minutes 45 seconds  Estimated Blood Loss:  Estimated blood loss: none.      [facility]

## 2022-11-15 NOTE — H&P (Signed)
Midge Minium, MD [facility] [address], [address] [place], [place] [address] Phone:[phone] Fax : [phone]  Primary Care Physician:  [name], MD Primary Gastroenterologist:  Dr. Servando Snare  Pre-Procedure History & Physical: HPI:  Sherri Lee is a 67 y.o. female is here for an endoscopy and colonoscopy.   Past Medical History:  Diagnosis Date   Anemia    Low iron and hemoglobin   Anginal pain (HCC)    states she thinks it is stress   Anxiety    Asthma    COVID-19    07/05/20, 03/2021   Depression    Dysrhythmia    states she has PVC's   Fibromyalgia    Hypertension    Recurrent vomiting 02/04/2018   Schatzki's ring    dialated 3 times by Markham Jordan     Past Surgical History:  Procedure Laterality Date   ABDOMINAL ADHESION SURGERY     BUNIONECTOMY     left foot    CERVICAL FUSION     CESAREAN SECTION     CHOLECYSTECTOMY  June 2013   GASTRIC BYPASS      Prior to Admission medications   Medication Sig Start Date End Date Taking? Authorizing Provider  albuterol (VENTOLIN HFA) 108 (90 Base) MCG/ACT inhaler TAKE 2 PUFFS BY MOUTH EVERY 6 HOURS AS NEEDED FOR WHEEZE OR SHORTNESS OF BREATH 09/29/20  Yes McLean-Scocuzza, Pasty Spillers, MD  acyclovir (ZOVIRAX) 400 MG tablet Take 1 tablet (400 mg total) by mouth 5 (five) times daily. 05/13/22   [name], MD  amLODipine (NORVASC) 5 MG tablet TAKE 1 TABLET (5 MG TOTAL) BY MOUTH DAILY. 05/18/22   [name], MD  atorvastatin (LIPITOR) 20 MG tablet TAKE 1 TABLET BY MOUTH EVERY DAY 05/18/22   [name], MD  calcium carbonate (OS-CAL) 600 MG TABS Take 600 mg by mouth 2 (two) times daily with a meal.    [provider]  cyclobenzaprine (FLEXERIL) 5 MG tablet Take 1 tablet (5 mg total) by mouth at bedtime. 07/04/22   [name], MD  diclofenac Sodium (VOLTAREN) 1 % GEL Apply 2 g topically 4 (four) times daily. 11/10/20   [name], MD  escitalopram (LEXAPRO) 20 MG tablet Take 20 mg by mouth daily.      [provider]  estradiol (ESTRACE) 0.1 MG/GM vaginal cream Place vaginally. 01/24/22   [provider]  fluticasone (FLONASE) 50 MCG/ACT nasal spray Place 2 sprays into Lee nostrils daily as needed for allergies or rhinitis. After nasal saline 04/04/22   [name], MD  furosemide (LASIX) 20 MG tablet TAKE 1 TABLET (20 MG TOTAL) BY MOUTH AS NEEDED FOR EDEMA. 02/18/22   [name], MD  gabapentin (NEURONTIN) 100 MG capsule TAKE 1 CAPSULE (100 MG TOTAL) BY MOUTH THREE TIMES DAILY. 08/18/21   [name], MD  latanoprost (XALATAN) 0.005 % ophthalmic solution 1 drop at bedtime. 10/16/20   [provider]  LORazepam (ATIVAN) 1 MG tablet TAKE 1 TABLET BY MOUTH TWICE A DAY AS DIRECTED FOR ANXIETY/PANIC 06/21/17   [provider]  metoprolol succinate (TOPROL-XL) 50 MG 24 hr tablet TAKE 0.5 TABLETS (25 MG TOTAL) BY MOUTH DAILY. TAKE WITH OR IMMEDIATELY FOLLOWING A MEAL. 03/23/22   [name], MD  Multiple Vitamin (MULTIVITAMIN) tablet Take 1 tablet by mouth daily. Bariatric Vitamin    [provider]  oxybutynin (DITROPAN-XL) 10 MG 24 hr tablet TAKE 1 TABLET BY MOUTH EVERYDAY AT  BEDTIME 02/18/22   [name], MD  pantoprazole (PROTONIX) 40 MG tablet TAKE 1 TABLET BY MOUTH EVERY DAY 12/06/21   [name], MD  potassium chloride SA (KLOR-CON M) 20 MEQ tablet Take 1 tablet (20 mEq total) by mouth 2 (two) times daily. 07/08/21   [name], MD  predniSONE (DELTASONE) 10 MG tablet 6 tablets daily for 3 days, then reduce by 1 tablet daily until gone Patient not taking: Reported on 10/03/2022 07/01/22   [name], MD  traMADol (ULTRAM) 50 MG tablet TAKE 1 TABLET BY MOUTH EVERY 12 HOURS AS NEEDED. 11/11/22   [name], MD  VITAMIN D PO Take by mouth. Patient not taking: Reported on 10/03/2022    [provider]  zolpidem (AMBIEN) 10 MG tablet Take 10 mg by mouth at bedtime as needed for sleep.    [provider]     Allergies as of 09/14/2022 - Review Complete 07/04/2022  Allergen Reaction Noted   Bacitracin-polymyxin b Rash 08/06/2012   Egg-derived products  06/26/2017   Povidone iodine Rash and Other (See Comments) 07/08/2014    Family History  Problem Relation Age of Onset   Hypertension Mother    Glaucoma Mother    Heart disease Father    Stroke Father    Breast cancer Maternal Aunt    Cancer Maternal Aunt    Colon cancer Neg Hx    Ovarian cancer Neg Hx     Social History   Socioeconomic History   Marital status: Divorced    Spouse name: Not on file   Number of children: Not on file   Years of education: Not on file   Highest education level: Not on file  Occupational History   Not on file  Tobacco Use   Smoking status: Never   Smokeless tobacco: Never  Substance and Sexual Activity   Alcohol use: Yes    Alcohol/week: 3.0 standard drinks of alcohol    Types: 3 Glasses of wine per week   Drug use: No   Sexual activity: Yes  Other Topics Concern   Not on file  Social History Narrative   Lives with spouse, son, and stepson who has serious psychiatric issues and alcohol abuse   Social Determinants of Health   Financial Resource Strain: Low Risk  (04/28/2022)   Overall Financial Resource Strain (CARDIA)    Difficulty of Paying Living Expenses: Not hard at all  Food Insecurity: No Food Insecurity (04/28/2022)   Hunger Vital Sign    Worried About Running Out of Food in the Last Year: Never true    Ran Out of Food in the Last Year: Never true  Transportation Needs: No Transportation Needs (04/28/2022)   PRAPARE - Administrator, Civil Service (Medical): No    Lack of Transportation (Non-Medical): No  Physical Activity: Sufficiently Active (04/28/2022)   Exercise Vital Sign    Days of Exercise per Week: 5 days    Minutes of Exercise per Session: 30 min  Stress: No Stress Concern Present (04/28/2022)   [employer] of Occupational Health - Occupational  Stress Questionnaire    Feeling of Stress : Not at all  Social Connections: Unknown (04/28/2022)   Social Connection and Isolation Panel [NHANES]    Frequency of Communication with Friends and Family: More than three times a week    Frequency of Social Gatherings with Friends and Family: More than three times a week    Attends Religious Services:  Not on file    Active Member of Clubs or Organizations: Not on file    Attends Club or Organization Meetings: Not on file    Marital Status: Not on file  Intimate Partner Violence: Not At Risk (04/28/2022)   Humiliation, Afraid, Rape, and Kick questionnaire    Fear of Current or Ex-Partner: No    Emotionally Abused: No    Physically Abused: No    Sexually Abused: No    Review of Systems: See HPI, otherwise negative ROS  Physical Exam: BP (!) 150/113   Pulse (!) 101   Temp (!) 97 F (36.1 C) (Temporal)   Resp 16   Wt 79.4 kg   SpO2 100%   BMI 29.12 kg/m  General:   Alert,  pleasant and cooperative in NAD Head:  Normocephalic and atraumatic. Neck:  Supple; no masses or thyromegaly. Lungs:  Clear throughout to auscultation.    Heart:  Regular rate and rhythm. Abdomen:  Soft, nontender and nondistended. Normal bowel sounds, without guarding, and without rebound.   Neurologic:  Alert and  oriented x4;  grossly normal neurologically.  Impression/Plan: Sherri Lee is here for an endoscopy and colonoscopy to be performed for screening and dysphagia  Risks, benefits, limitations, and alternatives regarding  endoscopy and colonoscopy have been reviewed with the patient.  Questions have been answered.  All parties agreeable.   Midge Minium, MD  11/15/2022, 10:26 AM

## 2022-11-15 NOTE — Anesthesia Procedure Notes (Signed)
Date/Time: 11/15/2022 10:45 AM  Performed by: Malva Cogan, CRNAPre-anesthesia Checklist: Patient identified, Emergency Drugs available, Suction available, Patient being monitored and Timeout performed Patient Re-evaluated:Patient Re-evaluated prior to induction Oxygen Delivery Method: Nasal cannula Induction Type: IV induction Placement Confirmation: CO2 detector and positive ETCO2

## 2022-11-15 NOTE — Anesthesia Postprocedure Evaluation (Signed)
Anesthesia Post Note  Patient: Sherri Lee  Procedure(s) Performed: COLONOSCOPY WITH PROPOFOL ESOPHAGOGASTRODUODENOSCOPY (EGD) WITH PROPOFOL BALLOON DILATION  Patient location during evaluation: Endoscopy Anesthesia Type: General Level of consciousness: awake and alert Pain management: pain level controlled Vital Signs Assessment: post-procedure vital signs reviewed and stable Respiratory status: spontaneous breathing, nonlabored ventilation, respiratory function stable and patient connected to nasal cannula oxygen Cardiovascular status: blood pressure returned to baseline and stable Postop Assessment: no apparent nausea or vomiting Anesthetic complications: no   No notable events documented.   Last Vitals:  Vitals:   11/15/22 1025 11/15/22 1113  BP: (!) 150/113 (!) 93/51  Pulse: (!) 101   Resp: 16   Temp: (!) 36.1 C (!) 36.2 C  SpO2: 100%     Last Pain:  Vitals:   11/15/22 1113  TempSrc: Temporal  PainSc: 0-No pain                 Louie Boston

## 2022-11-16 ENCOUNTER — Encounter: Payer: Self-pay | Admitting: Gastroenterology

## 2022-11-17 NOTE — Telephone Encounter (Signed)
[facility] states her [facility] states they are out of the traMADol (ULTRAM) 50 MG tablet and it will be weeks before they will have more in.  Patient states she would like for [place] to please send this prescription to [facility] on [address] in [place].

## 2022-11-18 ENCOUNTER — Ambulatory Visit (INDEPENDENT_AMBULATORY_CARE_PROVIDER_SITE_OTHER): Payer: Medicare Other | Admitting: Nurse Practitioner

## 2022-11-18 ENCOUNTER — Encounter: Payer: Self-pay | Admitting: Nurse Practitioner

## 2022-11-18 VITALS — BP 130/72 | HR 79 | Ht 65.0 in | Wt 175.0 lb

## 2022-11-18 DIAGNOSIS — R300 Dysuria: Secondary | ICD-10-CM | POA: Diagnosis not present

## 2022-11-18 LAB — POCT URINALYSIS DIPSTICK
Glucose, UA: NEGATIVE
Nitrite, UA: NEGATIVE
Protein, UA: NEGATIVE
Spec Grav, UA: 1.025 (ref 1.010–1.025)
Urobilinogen, UA: 1 U/dL
pH, UA: 6 (ref 5.0–8.0)

## 2022-11-18 MED ORDER — TRAMADOL HCL 50 MG PO TABS
50.0000 mg | ORAL_TABLET | Freq: Two times a day (BID) | ORAL | 4 refills | Status: DC | PRN
Start: 2022-11-18 — End: 2023-04-17

## 2022-11-18 MED ORDER — CEPHALEXIN 500 MG PO CAPS: 500.0000 mg | ORAL_CAPSULE | Freq: Four times a day (QID) | ORAL | 0 refills | Status: DC

## 2022-11-18 NOTE — Telephone Encounter (Signed)
[facility] not [facility] and stated that they will not have it in for a while. Pt is wanting to know if this can be sent to [facility] on [address].

## 2022-11-18 NOTE — Telephone Encounter (Signed)
[address] [employer]

## 2022-11-18 NOTE — Telephone Encounter (Signed)
Patient states at check-out today that [facility] on [address] has not received her prescription yet for traMADol (ULTRAM) 50 MG tablet.  Patient states she is out of this medication.

## 2022-11-18 NOTE — Progress Notes (Signed)
Established Patient Office Visit  Subjective:  Patient ID: Sherri Lee, female    DOB: 02/13/1956  Age: 67 y.o. MRN: [id number]  CC:  Chief Complaint  Patient presents with   Dysuria    HPI  Sherri Lee presents for:  Dysuria  This is a new problem. The current episode started 1 to 4 weeks ago. The problem has been unchanged. Associated symptoms include frequency, hesitancy and urgency. Pertinent negatives include no discharge or hematuria. Treatments tried: Azo.     Past Medical History:  Diagnosis Date   Anemia    Low iron and hemoglobin   Anginal pain (HCC)    states she thinks it is stress   Anxiety    Asthma    COVID-19    07/05/20, 03/2021   Depression    Dysrhythmia    states she has PVC's   Fibromyalgia    Hypertension    Recurrent vomiting 02/04/2018   Schatzki's ring    dialated 3 times by Markham Jordan     Past Surgical History:  Procedure Laterality Date   ABDOMINAL ADHESION SURGERY     BALLOON DILATION  11/15/2022   Procedure: BALLOON DILATION;  Surgeon: Midge Minium, MD;  Location: [facility] ENDOSCOPY;  Service: Endoscopy;;   BUNIONECTOMY     left foot    CERVICAL FUSION     CESAREAN SECTION     CHOLECYSTECTOMY  June 2013   COLONOSCOPY WITH PROPOFOL N/A 11/15/2022   Procedure: COLONOSCOPY WITH PROPOFOL;  Surgeon: Midge Minium, MD;  Location: [facility] ENDOSCOPY;  Service: Endoscopy;  Laterality: N/A;   ESOPHAGOGASTRODUODENOSCOPY (EGD) WITH PROPOFOL N/A 11/15/2022   Procedure: ESOPHAGOGASTRODUODENOSCOPY (EGD) WITH PROPOFOL;  Surgeon: Midge Minium, MD;  Location: [facility] ENDOSCOPY;  Service: Endoscopy;  Laterality: N/A;   GASTRIC BYPASS      Family History  Problem Relation Age of Onset   Hypertension Mother    Glaucoma Mother    Heart disease Father    Stroke Father    Breast cancer Maternal Aunt    Cancer Maternal Aunt    Colon cancer Neg Hx    Ovarian cancer Neg Hx     Social History   Socioeconomic History   Marital status:  Divorced    Spouse name: Not on file   Number of children: Not on file   Years of education: Not on file   Highest education level: Not on file  Occupational History   Not on file  Tobacco Use   Smoking status: Never   Smokeless tobacco: Never  Vaping Use   Vaping status: Never Used  Substance and Sexual Activity   Alcohol use: Yes    Alcohol/week: 3.0 standard drinks of alcohol    Types: 3 Glasses of wine per week   Drug use: No   Sexual activity: Yes  Other Topics Concern   Not on file  Social History Narrative   Lives with spouse, son, and stepson who has serious psychiatric issues and alcohol abuse   Social Determinants of Health   Financial Resource Strain: Low Risk  (04/28/2022)   Overall Financial Resource Strain (CARDIA)    Difficulty of Paying Living Expenses: Not hard at all  Food Insecurity: No Food Insecurity (04/28/2022)   Hunger Vital Sign    Worried About Running Out of Food in the Last Year: Never true    Ran Out of Food in the Last Year: Never true  Transportation Needs: No Transportation Needs (04/28/2022)   PRAPARE - Transportation  Lack of Transportation (Medical): No    Lack of Transportation (Non-Medical): No  Physical Activity: Sufficiently Active (04/28/2022)   Exercise Vital Sign    Days of Exercise per Week: 5 days    Minutes of Exercise per Session: 30 min  Stress: No Stress Concern Present (04/28/2022)   [employer] of Occupational Health - Occupational Stress Questionnaire    Feeling of Stress : Not at all  Social Connections: Unknown (04/28/2022)   Social Connection and Isolation Panel [NHANES]    Frequency of Communication with Friends and Family: More than three times a week    Frequency of Social Gatherings with Friends and Family: More than three times a week    Attends Religious Services: Not on file    Active Member of Clubs or Organizations: Not on file    Attends Banker Meetings: Not on file    Marital Status: Not  on file  Intimate Partner Violence: Not At Risk (04/28/2022)   Humiliation, Afraid, Rape, and Kick questionnaire    Fear of Current or Ex-Partner: No    Emotionally Abused: No    Physically Abused: No    Sexually Abused: No     Outpatient Medications Prior to Visit  Medication Sig Dispense Refill   acyclovir (ZOVIRAX) 400 MG tablet Take 1 tablet (400 mg total) by mouth 5 (five) times daily. 35 tablet 3   albuterol (VENTOLIN HFA) 108 (90 Base) MCG/ACT inhaler TAKE 2 PUFFS BY MOUTH EVERY 6 HOURS AS NEEDED FOR WHEEZE OR SHORTNESS OF BREATH 8.5 each 2   amLODipine (NORVASC) 5 MG tablet TAKE 1 TABLET (5 MG TOTAL) BY MOUTH DAILY. 90 tablet 1   atorvastatin (LIPITOR) 20 MG tablet TAKE 1 TABLET BY MOUTH EVERY DAY 90 tablet 3   calcium carbonate (OS-CAL) 600 MG TABS Take 600 mg by mouth 2 (two) times daily with a meal.     cyclobenzaprine (FLEXERIL) 5 MG tablet Take 1 tablet (5 mg total) by mouth at bedtime. 90 tablet 1   diclofenac Sodium (VOLTAREN) 1 % GEL Apply 2 g topically 4 (four) times daily. 100 g G   escitalopram (LEXAPRO) 20 MG tablet Take 20 mg by mouth daily.      estradiol (ESTRACE) 0.1 MG/GM vaginal cream Place vaginally.     fluticasone (FLONASE) 50 MCG/ACT nasal spray Place 2 sprays into both nostrils daily as needed for allergies or rhinitis. After nasal saline 48 mL 5   furosemide (LASIX) 20 MG tablet TAKE 1 TABLET (20 MG TOTAL) BY MOUTH AS NEEDED FOR EDEMA. 20 tablet 1   gabapentin (NEURONTIN) 100 MG capsule TAKE 1 CAPSULE (100 MG TOTAL) BY MOUTH THREE TIMES DAILY. 270 capsule 3   latanoprost (XALATAN) 0.005 % ophthalmic solution 1 drop at bedtime.     LORazepam (ATIVAN) 1 MG tablet TAKE 1 TABLET BY MOUTH TWICE A DAY AS DIRECTED FOR ANXIETY/PANIC  2   metoprolol succinate (TOPROL-XL) 50 MG 24 hr tablet TAKE 0.5 TABLETS (25 MG TOTAL) BY MOUTH DAILY. TAKE WITH OR IMMEDIATELY FOLLOWING A MEAL. 45 tablet 3   Multiple Vitamin (MULTIVITAMIN) tablet Take 1 tablet by mouth daily.  Bariatric Vitamin     oxybutynin (DITROPAN-XL) 10 MG 24 hr tablet TAKE 1 TABLET BY MOUTH EVERYDAY AT BEDTIME 90 tablet 1   pantoprazole (PROTONIX) 40 MG tablet TAKE 1 TABLET BY MOUTH EVERY DAY 90 tablet 3   potassium chloride SA (KLOR-CON M) 20 MEQ tablet Take 1 tablet (20 mEq total) by mouth 2 (two)  times daily. 10 tablet 0   VITAMIN D PO Take by mouth.     zolpidem (AMBIEN) 10 MG tablet Take 10 mg by mouth at bedtime as needed for sleep.     traMADol (ULTRAM) 50 MG tablet TAKE 1 TABLET BY MOUTH EVERY 12 HOURS AS NEEDED. 60 tablet 4   predniSONE (DELTASONE) 10 MG tablet 6 tablets daily for 3 days, then reduce by 1 tablet daily until gone (Patient not taking: Reported on 11/18/2022) 33 tablet 0   No facility-administered medications prior to visit.    Allergies  Allergen Reactions   Bacitracin-Polymyxin B Rash   Egg-Derived Products    Povidone Iodine Rash and Other (See Comments)    ROS Review of Systems  Genitourinary:  Positive for dysuria, frequency, hesitancy and urgency. Negative for hematuria.   Negative unless indicated in HPI.    Objective:    Physical Exam Constitutional:      Appearance: Normal appearance.  Cardiovascular:     Rate and Rhythm: Normal rate and regular rhythm.     Pulses: Normal pulses.     Heart sounds: Normal heart sounds.  Abdominal:     General: Bowel sounds are normal.     Palpations: Abdomen is soft.     Tenderness: There is no abdominal tenderness. There is no right CVA tenderness or left CVA tenderness.  Musculoskeletal:     Cervical back: Normal range of motion.  Neurological:     General: No focal deficit present.     Mental Status: She is alert. Mental status is at baseline.  Psychiatric:        Mood and Affect: Mood normal.        Behavior: Behavior normal.        Thought Content: Thought content normal.        Judgment: Judgment normal.     BP 130/72   Pulse 79   Ht 5\' 5"  (1.651 m)   Wt 175 lb (79.4 kg)   SpO2 98%   BMI  29.12 kg/m  Wt Readings from Last 3 Encounters:  11/18/22 175 lb (79.4 kg)  11/15/22 175 lb (79.4 kg)  10/03/22 177 lb (80.3 kg)     Health Maintenance  Topic Date Due   Zoster Vaccines- Shingrix (1 of 2) Never done   DEXA SCAN  Never done   DTaP/Tdap/Td (3 - Td or Tdap) 07/13/2022   OPHTHALMOLOGY EXAM  09/12/2022   COVID-19 Vaccine (5 - 2023-24 season) 11/13/2022   MAMMOGRAM  04/29/2023 (Originally 02/25/2017)   INFLUENZA VACCINE  06/12/2023 (Originally 10/13/2022)   HEMOGLOBIN A1C  12/29/2022   Medicare Annual Wellness (AWV)  04/29/2023   Diabetic kidney evaluation - eGFR measurement  06/29/2023   Diabetic kidney evaluation - Urine ACR  06/29/2023   Colonoscopy  11/14/2032   Pneumonia Vaccine 23+ Years old  Completed   Hepatitis C Screening  Completed   HPV VACCINES  Aged Out    There are no preventive care reminders to display for this patient.  Lab Results  Component Value Date   TSH 0.65 06/29/2022   Lab Results  Component Value Date   WBC 6.4 07/06/2021   HGB 11.3 (L) 07/06/2021   HCT 35.0 (L) 07/06/2021   MCV 86.3 07/06/2021   PLT 199.0 07/06/2021   Lab Results  Component Value Date   NA 141 06/29/2022   K 3.7 06/29/2022   CO2 29 06/29/2022   GLUCOSE 76 06/29/2022   BUN 8 06/29/2022   CREATININE  0.77 06/29/2022   BILITOT 0.5 06/29/2022   ALKPHOS 93 06/29/2022   AST 22 06/29/2022   ALT 34 06/29/2022   PROT 6.2 06/29/2022   ALBUMIN 3.6 06/29/2022   CALCIUM 8.9 06/29/2022   ANIONGAP 8 01/29/2021   EGFR 92 07/30/2021   GFR 79.77 06/29/2022   Lab Results  Component Value Date   CHOL 158 06/29/2022   Lab Results  Component Value Date   HDL 86 06/29/2022   Lab Results  Component Value Date   LDLCALC 59 06/29/2022   Lab Results  Component Value Date   TRIG 54 06/29/2022   Lab Results  Component Value Date   CHOLHDL 1.8 06/29/2022   Lab Results  Component Value Date   HGBA1C 6.4 06/29/2022      Assessment & Plan:  Dysuria Assessment  & Plan: POCT urinalysis positive for trace blood, leukocyte and negative for nitrite. Urine culture pending. Will treat with cephalexin advised patient to use probiotics or eat while on antibiotic. Continue AZO for pain.  Orders: -     POCT urinalysis dipstick -     Urine Culture -     Urine Microscopic  Other orders -     Cephalexin; Take 1 capsule (500 mg total) by mouth 4 (four) times daily.  Dispense: 14 capsule; Refill: 0    Follow-up: Return if symptoms worsen or fail to improve.   Kara Dies, NP

## 2022-11-18 NOTE — Addendum Note (Signed)
Addended by: Bethanie Dicker on: 11/18/2022 04:42 PM   Modules accepted: Orders

## 2022-11-19 LAB — URINALYSIS, MICROSCOPIC ONLY
Bacteria, UA: NONE SEEN /HPF
Hyaline Cast: NONE SEEN /LPF
RBC / HPF: NONE SEEN /HPF (ref 0–2)
Squamous Epithelial / HPF: NONE SEEN /HPF (ref <=5)
WBC, UA: NONE SEEN /HPF (ref 0–5)

## 2022-11-19 LAB — URINE CULTURE
MICRO NUMBER:: 15432103
SPECIMEN QUALITY:: ADEQUATE

## 2022-11-28 NOTE — Assessment & Plan Note (Signed)
POCT urinalysis positive for trace blood, leukocyte and negative for nitrite. Urine culture pending. Will treat with cephalexin advised patient to use probiotics or eat while on antibiotic. Continue AZO for pain.

## 2022-12-08 ENCOUNTER — Other Ambulatory Visit: Payer: Self-pay | Admitting: Internal Medicine

## 2022-12-08 DIAGNOSIS — M47812 Spondylosis without myelopathy or radiculopathy, cervical region: Secondary | ICD-10-CM

## 2022-12-08 DIAGNOSIS — M62838 Other muscle spasm: Secondary | ICD-10-CM

## 2022-12-10 ENCOUNTER — Other Ambulatory Visit: Payer: Self-pay | Admitting: Internal Medicine

## 2022-12-13 ENCOUNTER — Other Ambulatory Visit: Payer: Self-pay | Admitting: Internal Medicine

## 2023-01-25 ENCOUNTER — Telehealth: Payer: Medicare Other | Admitting: Internal Medicine

## 2023-01-25 ENCOUNTER — Encounter: Payer: Self-pay | Admitting: Internal Medicine

## 2023-01-25 ENCOUNTER — Encounter: Payer: Self-pay | Admitting: Oncology

## 2023-01-25 VITALS — Ht 65.0 in | Wt 175.0 lb

## 2023-01-25 DIAGNOSIS — J0100 Acute maxillary sinusitis, unspecified: Secondary | ICD-10-CM | POA: Diagnosis not present

## 2023-01-25 MED ORDER — GUAIFENESIN-CODEINE 100-10 MG/5ML PO SYRP: 5.0000 mL | ORAL_SOLUTION | Freq: Three times a day (TID) | ORAL | 0 refills | Status: DC | PRN

## 2023-01-25 MED ORDER — AZITHROMYCIN 500 MG PO TABS: 500.0000 mg | ORAL_TABLET | Freq: Every day | ORAL | 0 refills | Status: DC

## 2023-01-25 MED ORDER — PREDNISONE 10 MG PO TABS: ORAL_TABLET | ORAL | 0 refills | Status: DC

## 2023-01-25 MED ORDER — BENZONATATE 200 MG PO CAPS: 200.0000 mg | ORAL_CAPSULE | Freq: Three times a day (TID) | ORAL | 1 refills | Status: DC | PRN

## 2023-01-25 NOTE — Patient Instructions (Signed)
I am treating you for sinusitis/otitis which is a complication from your viral infection due to  persistent sinus congestion.   I am prescribing an antibiotic ( and a prednisone taper  To manage the infection and the inflammation in your ear/sinuses.   I also advise use of the following OTC meds to help with your other symptoms.   Use benzonatate capsules   FOR THE COUGH.  Gargle with salt water as needed for sore throat.

## 2023-01-25 NOTE — Progress Notes (Deleted)
Subjective:  Patient ID: Sherri Lee, female    DOB: [birth year]  Age: 67 y.o. MRN: [id number]  CC: {There were no encounter diagnoses. (Refresh or delete this SmartLink)}   HPI Sherri Lee presents for  Chief Complaint  Patient presents with   Cough    Head and chest congestion, fatigue, body aches, cough, sore throat since Saturday. Taking Dayquil and Nyquil, Mucinex D for two days.    67 yr old female presents with COUGH, WHEEZING,  BODY ACHES AND SINUS CONGESTION THAT STARTED SATURDAY EVENING AFTER ATTENDING  HER BOYFRIENDS FUNERAL AND HER SON'S ENGAGEMENT PARTY    Outpatient Medications Prior to Visit  Medication Sig Dispense Refill   acyclovir (ZOVIRAX) 400 MG tablet Take 1 tablet (400 mg total) by mouth 5 (five) times daily. 35 tablet 3   amLODipine (NORVASC) 5 MG tablet TAKE 1 TABLET (5 MG TOTAL) BY MOUTH DAILY. 90 tablet 1   atorvastatin (LIPITOR) 20 MG tablet TAKE 1 TABLET BY MOUTH EVERY DAY 90 tablet 3   calcium carbonate (OS-CAL) 600 MG TABS Take 600 mg by mouth 2 (two) times daily with a meal.     cyclobenzaprine (FLEXERIL) 5 MG tablet TAKE 1 TABLET BY MOUTH EVERYDAY AT BEDTIME 90 tablet 1   diclofenac Sodium (VOLTAREN) 1 % GEL Apply 2 g topically 4 (four) times daily. 100 g G   escitalopram (LEXAPRO) 20 MG tablet Take 20 mg by mouth daily.      estradiol (ESTRACE) 0.1 MG/GM vaginal cream Place vaginally.     furosemide (LASIX) 20 MG tablet TAKE 1 TABLET (20 MG TOTAL) BY MOUTH AS NEEDED FOR EDEMA. 20 tablet 1   gabapentin (NEURONTIN) 100 MG capsule TAKE 1 CAPSULE (100 MG TOTAL) BY MOUTH 3 TIMES A DAY 270 capsule 3   latanoprost (XALATAN) 0.005 % ophthalmic solution 1 drop at bedtime.     LORazepam (ATIVAN) 1 MG tablet TAKE 1 TABLET BY MOUTH TWICE A DAY AS DIRECTED FOR ANXIETY/PANIC  2   metoprolol succinate (TOPROL-XL) 50 MG 24 hr tablet TAKE 0.5 TABLETS (25 MG TOTAL) BY MOUTH DAILY. TAKE WITH OR IMMEDIATELY FOLLOWING A MEAL. 45 tablet 3    Multiple Vitamin (MULTIVITAMIN) tablet Take 1 tablet by mouth daily. Bariatric Vitamin     oxybutynin (DITROPAN-XL) 10 MG 24 hr tablet TAKE 1 TABLET BY MOUTH EVERYDAY AT BEDTIME 90 tablet 1   pantoprazole (PROTONIX) 40 MG tablet TAKE 1 TABLET BY MOUTH EVERY DAY 90 tablet 3   potassium chloride SA (KLOR-CON M) 20 MEQ tablet Take 1 tablet (20 mEq total) by mouth 2 (two) times daily. 10 tablet 0   traMADol (ULTRAM) 50 MG tablet Take 1 tablet (50 mg total) by mouth every 12 (twelve) hours as needed. 60 tablet 4   VITAMIN D PO Take by mouth.     zolpidem (AMBIEN) 10 MG tablet Take 10 mg by mouth at bedtime as needed for sleep.     albuterol (VENTOLIN HFA) 108 (90 Base) MCG/ACT inhaler TAKE 2 PUFFS BY MOUTH EVERY 6 HOURS AS NEEDED FOR WHEEZE OR SHORTNESS OF BREATH (Patient not taking: Reported on 01/25/2023) 8.5 each 2   fluticasone (FLONASE) 50 MCG/ACT nasal spray Place 2 sprays into both nostrils daily as needed for allergies or rhinitis. After nasal saline (Patient not taking: Reported on 01/25/2023) 48 mL 5   cephALEXin (KEFLEX) 500 MG capsule Take 1 capsule (500 mg total) by mouth 4 (four) times daily. (Patient not taking: Reported on 01/25/2023) 14 capsule  0   No facility-administered medications prior to visit.    Review of Systems;  Patient denies headache, fevers, malaise, unintentional weight loss, skin rash, eye pain, sinus congestion and sinus pain, sore throat, dysphagia,  hemoptysis , cough, dyspnea, wheezing, chest pain, palpitations, orthopnea, edema, abdominal pain, nausea, melena, diarrhea, constipation, flank pain, dysuria, hematuria, urinary  Frequency, nocturia, numbness, tingling, seizures,  Focal weakness, Loss of consciousness,  Tremor, insomnia, depression, anxiety, and suicidal ideation.      Objective:  Ht 5\' 5"  (1.651 m)   Wt 175 lb (79.4 kg)   BMI 29.12 kg/m   BP Readings from Last 3 Encounters:  11/18/22 130/72  11/15/22 129/70  07/04/22 124/78    Wt Readings  from Last 3 Encounters:  01/25/23 175 lb (79.4 kg)  11/18/22 175 lb (79.4 kg)  11/15/22 175 lb (79.4 kg)    Physical Exam  Lab Results  Component Value Date   HGBA1C 6.4 06/29/2022   HGBA1C 5.9 (A) 12/31/2021   HGBA1C 6.4 09/01/2020    Lab Results  Component Value Date   CREATININE 0.77 06/29/2022   CREATININE 0.72 07/30/2021   CREATININE 0.69 07/06/2021    Lab Results  Component Value Date   WBC 6.4 07/06/2021   HGB 11.3 (L) 07/06/2021   HCT 35.0 (L) 07/06/2021   PLT 199.0 07/06/2021   GLUCOSE 76 06/29/2022   CHOL 158 06/29/2022   TRIG 54 06/29/2022   HDL 86 06/29/2022   LDLDIRECT 99.0 09/04/2015   LDLCALC 59 06/29/2022   ALT 34 06/29/2022   AST 22 06/29/2022   NA 141 06/29/2022   K 3.7 06/29/2022   CL 105 06/29/2022   CREATININE 0.77 06/29/2022   BUN 8 06/29/2022   CO2 29 06/29/2022   TSH 0.65 06/29/2022   HGBA1C 6.4 06/29/2022   MICROALBUR <0.7 06/29/2022    No results found.  Assessment & Plan:  .There are no diagnoses linked to this encounter.   I provided 30 minutes of face-to-face time during this encounter reviewing patient's last visit with me, patient's  most recent visit with cardiology,  nephrology,  and neurology,  recent surgical and non surgical procedures, previous  labs and imaging studies, counseling on currently addressed issues,  and post visit ordering to diagnostics and therapeutics .   Follow-up: No follow-ups on file.   [name], MD

## 2023-01-26 NOTE — Assessment & Plan Note (Signed)
Given chronicity of symptoms, development of facial pain and exam consistent with bacterial URI,  Will treat with empiric antibiotics, decongestants, prednisone taper, cough suppressant and saline lavage.   Probiotic advised   

## 2023-01-30 NOTE — Progress Notes (Signed)
Virtual Visit via Caregility   Note   This format is felt to be most appropriate for this patient at this time.  All issues noted in this document were discussed and addressed.  No physical exam was performed (except for noted visual exam findings with Video Visits).   I connected with [name] on 01/17/23 at  5:15 PM EST by a video enabled telemedicine application e and verified that I am speaking with the correct person using two identifiers. Location patient: home Location provider: work or home office Persons participating in the virtual visit: patient, provider  I discussed the limitations, risks, security and privacy concerns of performing an evaluation and management service by telephone and the availability of in person appointments. I also discussed with the patient that there may be a patient responsible charge related to this service. The patient expressed understanding and agreed to proceed. Reason for visit: respiratory infection   HPI:  67 yr old female presents with COUGH, WHEEZING,  BODY ACHES AND SINUS CONGESTION THAT STARTED 6 days ago AFTER ATTENDING  HER BOYFRIENDS FUNERAL AND HER SON'S ENGAGEMENT PARTY .  She denies fevers, shortness of breath and pleurisy  but feels miserable   ROS: See pertinent positives and negatives per HPI.  Past Medical History:  Diagnosis Date   Anemia    Low iron and hemoglobin   Anginal pain (HCC)    states she thinks it is stress   Anxiety    Asthma    COVID-19    07/05/20, 03/2021   Depression    Dysrhythmia    states she has PVC's   Fibromyalgia    Hypertension    Recurrent vomiting 02/04/2018   Schatzki's ring    dialated 3 times by Markham Jordan     Past Surgical History:  Procedure Laterality Date   ABDOMINAL ADHESION SURGERY     BALLOON DILATION  11/15/2022   Procedure: BALLOON DILATION;  Surgeon: Midge Minium, MD;  Location: [facility] ENDOSCOPY;  Service: Endoscopy;;   BUNIONECTOMY     left foot    CERVICAL FUSION     CESAREAN  SECTION     CHOLECYSTECTOMY  June 2013   COLONOSCOPY WITH PROPOFOL N/A 11/15/2022   Procedure: COLONOSCOPY WITH PROPOFOL;  Surgeon: Midge Minium, MD;  Location: [facility] ENDOSCOPY;  Service: Endoscopy;  Laterality: N/A;   ESOPHAGOGASTRODUODENOSCOPY (EGD) WITH PROPOFOL N/A 11/15/2022   Procedure: ESOPHAGOGASTRODUODENOSCOPY (EGD) WITH PROPOFOL;  Surgeon: Midge Minium, MD;  Location: [facility] ENDOSCOPY;  Service: Endoscopy;  Laterality: N/A;   GASTRIC BYPASS      Family History  Problem Relation Age of Onset   Hypertension Mother    Glaucoma Mother    Heart disease Father    Stroke Father    Breast cancer Maternal Aunt    Cancer Maternal Aunt    Colon cancer Neg Hx    Ovarian cancer Neg Hx     SOCIAL HX:  reports that she has never smoked. She has never used smokeless tobacco. She reports current alcohol use of about 3.0 standard drinks of alcohol per week. She reports that she does not use drugs.    Current Outpatient Medications:    acyclovir (ZOVIRAX) 400 MG tablet, Take 1 tablet (400 mg total) by mouth 5 (five) times daily., Disp: 35 tablet, Rfl: 3   amLODipine (NORVASC) 5 MG tablet, TAKE 1 TABLET (5 MG TOTAL) BY MOUTH DAILY., Disp: 90 tablet, Rfl: 1   atorvastatin (LIPITOR) 20 MG tablet, TAKE 1 TABLET BY MOUTH EVERY DAY, Disp:  90 tablet, Rfl: 3   azithromycin (ZITHROMAX) 500 MG tablet, Take 1 tablet (500 mg total) by mouth daily., Disp: 7 tablet, Rfl: 0   benzonatate (TESSALON) 200 MG capsule, Take 1 capsule (200 mg total) by mouth 3 (three) times daily as needed for cough., Disp: 60 capsule, Rfl: 1   calcium carbonate (OS-CAL) 600 MG TABS, Take 600 mg by mouth 2 (two) times daily with a meal., Disp: , Rfl:    cyclobenzaprine (FLEXERIL) 5 MG tablet, TAKE 1 TABLET BY MOUTH EVERYDAY AT BEDTIME, Disp: 90 tablet, Rfl: 1   diclofenac Sodium (VOLTAREN) 1 % GEL, Apply 2 g topically 4 (four) times daily., Disp: 100 g, Rfl: G   escitalopram (LEXAPRO) 20 MG tablet, Take 20 mg by mouth daily. , Disp: ,  Rfl:    estradiol (ESTRACE) 0.1 MG/GM vaginal cream, Place vaginally., Disp: , Rfl:    furosemide (LASIX) 20 MG tablet, TAKE 1 TABLET (20 MG TOTAL) BY MOUTH AS NEEDED FOR EDEMA., Disp: 20 tablet, Rfl: 1   gabapentin (NEURONTIN) 100 MG capsule, TAKE 1 CAPSULE (100 MG TOTAL) BY MOUTH 3 TIMES A DAY, Disp: 270 capsule, Rfl: 3   guaiFENesin-codeine (ROBITUSSIN AC) 100-10 MG/5ML syrup, Take 5 mLs by mouth 3 (three) times daily as needed for cough., Disp: 120 mL, Rfl: 0   latanoprost (XALATAN) 0.005 % ophthalmic solution, 1 drop at bedtime., Disp: , Rfl:    LORazepam (ATIVAN) 1 MG tablet, TAKE 1 TABLET BY MOUTH TWICE A DAY AS DIRECTED FOR ANXIETY/PANIC, Disp: , Rfl: 2   metoprolol succinate (TOPROL-XL) 50 MG 24 hr tablet, TAKE 0.5 TABLETS (25 MG TOTAL) BY MOUTH DAILY. TAKE WITH OR IMMEDIATELY FOLLOWING A MEAL., Disp: 45 tablet, Rfl: 3   Multiple Vitamin (MULTIVITAMIN) tablet, Take 1 tablet by mouth daily. Bariatric Vitamin, Disp: , Rfl:    oxybutynin (DITROPAN-XL) 10 MG 24 hr tablet, TAKE 1 TABLET BY MOUTH EVERYDAY AT BEDTIME, Disp: 90 tablet, Rfl: 1   pantoprazole (PROTONIX) 40 MG tablet, TAKE 1 TABLET BY MOUTH EVERY DAY, Disp: 90 tablet, Rfl: 3   potassium chloride SA (KLOR-CON M) 20 MEQ tablet, Take 1 tablet (20 mEq total) by mouth 2 (two) times daily., Disp: 10 tablet, Rfl: 0   predniSONE (DELTASONE) 10 MG tablet, 6 tablets on Day 1 , then reduce by 1 tablet daily until gone, Disp: 21 tablet, Rfl: 0   traMADol (ULTRAM) 50 MG tablet, Take 1 tablet (50 mg total) by mouth every 12 (twelve) hours as needed., Disp: 60 tablet, Rfl: 4   VITAMIN D PO, Take by mouth., Disp: , Rfl:    zolpidem (AMBIEN) 10 MG tablet, Take 10 mg by mouth at bedtime as needed for sleep., Disp: , Rfl:    albuterol (VENTOLIN HFA) 108 (90 Base) MCG/ACT inhaler, TAKE 2 PUFFS BY MOUTH EVERY 6 HOURS AS NEEDED FOR WHEEZE OR SHORTNESS OF BREATH (Patient not taking: Reported on 01/25/2023), Disp: 8.5 each, Rfl: 2   fluticasone (FLONASE) 50  MCG/ACT nasal spray, Place 2 sprays into both nostrils daily as needed for allergies or rhinitis. After nasal saline (Patient not taking: Reported on 01/25/2023), Disp: 48 mL, Rfl: 5  EXAM:  VITALS per patient if applicable:  GENERAL: alert, oriented, appears  well and in no acute distress  HEENT: atraumatic, conjunttiva clear, no obvious abnormalities on inspection of external nose and ears  NECK: normal movements of the head and neck  LUNGS: on inspection no signs of respiratory distress, breathing rate appears normal, no obvious gross  SOB, gasping or wheezing  CV: no obvious cyanosis  MS: moves all visible extremities without noticeable abnormality  PSYCH/NEURO: pleasant and cooperative, no obvious depression or anxiety, speech and thought processing grossly intact  ASSESSMENT AND PLAN:  Acute non-recurrent maxillary sinusitis Assessment & Plan: Given chronicity of symptoms, development of facial pain and exam consistent with bacterial URI,  Will treat with empiric antibiotics, decongestants, prednisone taper, cough suppressant and saline lavage.   Probiotic advised     Other orders -     predniSONE; 6 tablets on Day 1 , then reduce by 1 tablet daily until gone  Dispense: 21 tablet; Refill: 0 -     Azithromycin; Take 1 tablet (500 mg total) by mouth daily.  Dispense: 7 tablet; Refill: 0 -     guaiFENesin-Codeine; Take 5 mLs by mouth 3 (three) times daily as needed for cough.  Dispense: 120 mL; Refill: 0 -     Benzonatate; Take 1 capsule (200 mg total) by mouth 3 (three) times daily as needed for cough.  Dispense: 60 capsule; Refill: 1      I discussed the assessment and treatment plan with the patient. The patient was provided an opportunity to ask questions and all were answered. The patient agreed with the plan and demonstrated an understanding of the instructions.   The patient was advised to call back or seek an in-person evaluation if the symptoms worsen or if the  condition fails to improve as anticipated.   I spent 20 minutes dedicated to the care of this patient on the date of this encounter to include pre-visit review of her medical history,  Face-to-face time with the patient , and post visit ordering of testing and therapeutics.    [name], MD

## 2023-02-01 ENCOUNTER — Telehealth: Payer: Self-pay | Admitting: Internal Medicine

## 2023-02-01 ENCOUNTER — Encounter: Payer: Self-pay | Admitting: Internal Medicine

## 2023-02-01 ENCOUNTER — Telehealth (INDEPENDENT_AMBULATORY_CARE_PROVIDER_SITE_OTHER): Payer: Medicare Other | Admitting: Internal Medicine

## 2023-02-01 VITALS — Ht 65.0 in | Wt 175.0 lb

## 2023-02-01 DIAGNOSIS — J189 Pneumonia, unspecified organism: Secondary | ICD-10-CM | POA: Diagnosis not present

## 2023-02-01 DIAGNOSIS — J0100 Acute maxillary sinusitis, unspecified: Secondary | ICD-10-CM | POA: Diagnosis not present

## 2023-02-01 MED ORDER — DOXYCYCLINE HYCLATE 100 MG PO TABS: 100.0000 mg | ORAL_TABLET | Freq: Two times a day (BID) | ORAL | 0 refills | Status: DC

## 2023-02-01 MED ORDER — GUAIFENESIN-CODEINE 100-10 MG/5ML PO SOLN: 5.0000 mL | Freq: Three times a day (TID) | ORAL | 0 refills | Status: DC | PRN

## 2023-02-01 MED ORDER — AMOXICILLIN-POT CLAVULANATE 875-125 MG PO TABS
1.0000 | ORAL_TABLET | Freq: Two times a day (BID) | ORAL | 0 refills | Status: DC
Start: 2023-02-01 — End: 2023-04-11

## 2023-02-01 NOTE — Telephone Encounter (Signed)
Spoke with pt and scheduled her for a virtual visit this afternoon at 5 pm.

## 2023-02-01 NOTE — Progress Notes (Unsigned)
Virtual Visit via Caregility   Note   This format is felt to be most appropriate for this patient at this time.  All issues noted in this document were discussed and addressed.  No physical exam was performed (except for noted visual exam findings with Video Visits).   I connected with Sherri Lee on 02/01/23 at  5:00 PM EST by a video enabled telemedicine application or telephone and verified that I am speaking with the correct person using two identifiers. Location patient: home Location provider: work or home office Persons participating in the virtual visit: patient, provider  I discussed the limitations, risks, security and privacy concerns of performing an evaluation and management service by telephone and the availability of in person appointments. I also discussed with the patient that there may be a patient responsible charge related to this service. The patient expressed understanding and agreed to proceed.  Reason for visit: persistent cough and congestion   HPI:   Treated on Nov 13 for sinusitis with azithromycin and prednsione taper.  Now repoerts heaache behind both eyes    chest congestion with productive sputu,  and sore throat .  no sinus drainage. No fevers. Exhausted,  sleeping most of the day  too fatigue to come in person today    ROS: See pertinent positives and negatives per HPI.  Past Medical History:  Diagnosis Date   Anemia    Low iron and hemoglobin   Anginal pain (HCC)    states she thinks it is stress   Anxiety    Asthma    COVID-19    07/05/20, 03/2021   Depression    Dysrhythmia    states she has PVC's   Fibromyalgia    Hypertension    Recurrent vomiting 02/04/2018   Schatzki's ring    dialated 3 times by Markham Jordan     Past Surgical History:  Procedure Laterality Date   ABDOMINAL ADHESION SURGERY     BALLOON DILATION  11/15/2022   Procedure: BALLOON DILATION;  Surgeon: Midge Minium, MD;  Location: [facility] ENDOSCOPY;  Service: Endoscopy;;   BUNIONECTOMY      left foot    CERVICAL FUSION     CESAREAN SECTION     CHOLECYSTECTOMY  June 2013   COLONOSCOPY WITH PROPOFOL N/A 11/15/2022   Procedure: COLONOSCOPY WITH PROPOFOL;  Surgeon: Midge Minium, MD;  Location: [facility] ENDOSCOPY;  Service: Endoscopy;  Laterality: N/A;   ESOPHAGOGASTRODUODENOSCOPY (EGD) WITH PROPOFOL N/A 11/15/2022   Procedure: ESOPHAGOGASTRODUODENOSCOPY (EGD) WITH PROPOFOL;  Surgeon: Midge Minium, MD;  Location: [facility] ENDOSCOPY;  Service: Endoscopy;  Laterality: N/A;   GASTRIC BYPASS      Family History  Problem Relation Age of Onset   Hypertension Mother    Glaucoma Mother    Heart disease Father    Stroke Father    Breast cancer Maternal Aunt    Cancer Maternal Aunt    Colon cancer Neg Hx    Ovarian cancer Neg Hx     SOCIAL HX: ***   Current Outpatient Medications:    acyclovir (ZOVIRAX) 400 MG tablet, Take 1 tablet (400 mg total) by mouth 5 (five) times daily., Disp: 35 tablet, Rfl: 3   albuterol (VENTOLIN HFA) 108 (90 Base) MCG/ACT inhaler, TAKE 2 PUFFS BY MOUTH EVERY 6 HOURS AS NEEDED FOR WHEEZE OR SHORTNESS OF BREATH, Disp: 8.5 each, Rfl: 2   amLODipine (NORVASC) 5 MG tablet, TAKE 1 TABLET (5 MG TOTAL) BY MOUTH DAILY., Disp: 90 tablet, Rfl: 1   atorvastatin (LIPITOR) 20  MG tablet, TAKE 1 TABLET BY MOUTH EVERY DAY, Disp: 90 tablet, Rfl: 3   benzonatate (TESSALON) 200 MG capsule, Take 1 capsule (200 mg total) by mouth 3 (three) times daily as needed for cough., Disp: 60 capsule, Rfl: 1   calcium carbonate (OS-CAL) 600 MG TABS, Take 600 mg by mouth 2 (two) times daily with a meal., Disp: , Rfl:    cyclobenzaprine (FLEXERIL) 5 MG tablet, TAKE 1 TABLET BY MOUTH EVERYDAY AT BEDTIME, Disp: 90 tablet, Rfl: 1   diclofenac Sodium (VOLTAREN) 1 % GEL, Apply 2 g topically 4 (four) times daily., Disp: 100 g, Rfl: G   escitalopram (LEXAPRO) 20 MG tablet, Take 20 mg by mouth daily. , Disp: , Rfl:    estradiol (ESTRACE) 0.1 MG/GM vaginal cream, Place vaginally., Disp: , Rfl:     fluticasone (FLONASE) 50 MCG/ACT nasal spray, Place 2 sprays into both nostrils daily as needed for allergies or rhinitis. After nasal saline, Disp: 48 mL, Rfl: 5   furosemide (LASIX) 20 MG tablet, TAKE 1 TABLET (20 MG TOTAL) BY MOUTH AS NEEDED FOR EDEMA., Disp: 20 tablet, Rfl: 1   gabapentin (NEURONTIN) 100 MG capsule, TAKE 1 CAPSULE (100 MG TOTAL) BY MOUTH 3 TIMES A DAY, Disp: 270 capsule, Rfl: 3   latanoprost (XALATAN) 0.005 % ophthalmic solution, 1 drop at bedtime., Disp: , Rfl:    LORazepam (ATIVAN) 1 MG tablet, TAKE 1 TABLET BY MOUTH TWICE A DAY AS DIRECTED FOR ANXIETY/PANIC, Disp: , Rfl: 2   metoprolol succinate (TOPROL-XL) 50 MG 24 hr tablet, TAKE 0.5 TABLETS (25 MG TOTAL) BY MOUTH DAILY. TAKE WITH OR IMMEDIATELY FOLLOWING A MEAL., Disp: 45 tablet, Rfl: 3   Multiple Vitamin (MULTIVITAMIN) tablet, Take 1 tablet by mouth daily. Bariatric Vitamin, Disp: , Rfl:    oxybutynin (DITROPAN-XL) 10 MG 24 hr tablet, TAKE 1 TABLET BY MOUTH EVERYDAY AT BEDTIME, Disp: 90 tablet, Rfl: 1   pantoprazole (PROTONIX) 40 MG tablet, TAKE 1 TABLET BY MOUTH EVERY DAY, Disp: 90 tablet, Rfl: 3   potassium chloride SA (KLOR-CON M) 20 MEQ tablet, Take 1 tablet (20 mEq total) by mouth 2 (two) times daily., Disp: 10 tablet, Rfl: 0   traMADol (ULTRAM) 50 MG tablet, Take 1 tablet (50 mg total) by mouth every 12 (twelve) hours as needed., Disp: 60 tablet, Rfl: 4   VITAMIN D PO, Take by mouth., Disp: , Rfl:    zolpidem (AMBIEN) 10 MG tablet, Take 10 mg by mouth at bedtime as needed for sleep., Disp: , Rfl:   EXAM:  VITALS per patient if applicable:  GENERAL: alert, oriented, appears well and in no acute distress  HEENT: atraumatic, conjunttiva clear, no obvious abnormalities on inspection of external nose and ears  NECK: normal movements of the head and neck  LUNGS: on inspection no signs of respiratory distress, breathing rate appears normal, no obvious gross SOB, gasping or wheezing  CV: no obvious cyanosis  MS:  moves all visible extremities without noticeable abnormality  PSYCH/NEURO: pleasant and cooperative, no obvious depression or anxiety, speech and thought processing grossly intact  ASSESSMENT AND PLAN: There are no diagnoses linked to this encounter.    I discussed the assessment and treatment plan with the patient. The patient was provided an opportunity to ask questions and all were answered. The patient agreed with the plan and demonstrated an understanding of the instructions.   The patient was advised to call back or seek an in-person evaluation if the symptoms worsen or if the condition  fails to improve as anticipated.   I spent 30 minutes dedicated to the care of this patient on the date of this encounter to include pre-visit review of his medical history,  Face-to-face time with the patient , and post visit ordering of testing and therapeutics.    [name], MD

## 2023-02-01 NOTE — Telephone Encounter (Signed)
Patient just called and said she just did a MyChart visit a week ago. She is still not feeling better. She said she is having head and chest congestion. She is still coughing and hard for her to breathe. She feel terrible. She said it will be two weeks on Saturday. I transferred her to speak with access nurse. Her number is [phone].

## 2023-02-02 DIAGNOSIS — J189 Pneumonia, unspecified organism: Secondary | ICD-10-CM | POA: Insufficient documentation

## 2023-02-02 HISTORY — DX: Pneumonia, unspecified organism: J18.9

## 2023-02-02 NOTE — Assessment & Plan Note (Signed)
She reports a change in symptoms to chest fullness,  productive cough and sore throat despite one week of azithromycin.  Given her comobordities will treat for CAP with 2 drug therapy (amox/clav and doxy).  MUST BE SEEN If symptoms do not resolve with second round of abx

## 2023-02-02 NOTE — Assessment & Plan Note (Signed)
Empiric tx  WITH AMOX/CLAV AND DOXY GIVEN AGE AND PERSISTENT SYMPTOMS.  Must be seen Is symptoms do not resolve prior to any more treatment

## 2023-03-03 ENCOUNTER — Ambulatory Visit: Payer: Medicare Other | Admitting: Nurse Practitioner

## 2023-03-03 VITALS — BP 121/78 | HR 87 | Temp 98.7°F | Ht 65.0 in | Wt 182.6 lb

## 2023-03-03 DIAGNOSIS — R197 Diarrhea, unspecified: Secondary | ICD-10-CM

## 2023-03-03 LAB — POC URINALSYSI DIPSTICK (AUTOMATED)
Bilirubin, UA: NEGATIVE
Blood, UA: NEGATIVE
Glucose, UA: NEGATIVE
Leukocytes, UA: NEGATIVE
Nitrite, UA: NEGATIVE
Protein, UA: NEGATIVE
Spec Grav, UA: 1.025 (ref 1.010–1.025)
Urobilinogen, UA: 0.2 U/dL
pH, UA: 6.5 (ref 5.0–8.0)

## 2023-03-03 NOTE — Progress Notes (Signed)
Established Patient Office Visit  Subjective:  Patient ID: Sherri Lee, female    DOB: 11-01-1955  Age: 67 y.o. MRN: [id number]  CC:  Chief Complaint  Patient presents with   GI Problem    Watery stool    HPI  Sherri Lee presents for diarrhea from 2 weeks.  Patient reports that her stools are greenish and watery in consistency. Patient reports that she had been on multiple rounds of antibiotics due to sinusitis and possible CAP.  Patient reports that she was eating yogurt with antibiotic for few days.  Patient reported intermittent abdominal pain.  Denies fever , She also does report has been going through lot of stress, lost her partner to cancer in October. Care giver of her elderly parents.   HPI   Past Medical History:  Diagnosis Date   Anemia    Low iron and hemoglobin   Anginal pain (HCC)    states she thinks it is stress   Anxiety    Asthma    COVID-19    07/05/20, 03/2021   Depression    Dysrhythmia    states she has PVC's   Fibromyalgia    Hypertension    Recurrent vomiting 02/04/2018   Schatzki's ring    dialated 3 times by Markham Jordan     Past Surgical History:  Procedure Laterality Date   ABDOMINAL ADHESION SURGERY     BALLOON DILATION  11/15/2022   Procedure: BALLOON DILATION;  Surgeon: Midge Minium, MD;  Location: [facility] ENDOSCOPY;  Service: Endoscopy;;   BUNIONECTOMY     left foot    CERVICAL FUSION     CESAREAN SECTION     CHOLECYSTECTOMY  June 2013   COLONOSCOPY WITH PROPOFOL N/A 11/15/2022   Procedure: COLONOSCOPY WITH PROPOFOL;  Surgeon: Midge Minium, MD;  Location: [facility] ENDOSCOPY;  Service: Endoscopy;  Laterality: N/A;   ESOPHAGOGASTRODUODENOSCOPY (EGD) WITH PROPOFOL N/A 11/15/2022   Procedure: ESOPHAGOGASTRODUODENOSCOPY (EGD) WITH PROPOFOL;  Surgeon: Midge Minium, MD;  Location: [facility] ENDOSCOPY;  Service: Endoscopy;  Laterality: N/A;   GASTRIC BYPASS      Family History  Problem Relation Age of Onset   Hypertension  Mother    Glaucoma Mother    Heart disease Father    Stroke Father    Breast cancer Maternal Aunt    Cancer Maternal Aunt    Colon cancer Neg Hx    Ovarian cancer Neg Hx     Social History   Socioeconomic History   Marital status: Divorced    Spouse name: Not on file   Number of children: Not on file   Years of education: Not on file   Highest education level: Not on file  Occupational History   Not on file  Tobacco Use   Smoking status: Never   Smokeless tobacco: Never  Vaping Use   Vaping status: Never Used  Substance and Sexual Activity   Alcohol use: Yes    Alcohol/week: 3.0 standard drinks of alcohol    Types: 3 Glasses of wine per week   Drug use: No   Sexual activity: Yes  Other Topics Concern   Not on file  Social History Narrative   Lives with spouse, son, and stepson who has serious psychiatric issues and alcohol abuse   Social Drivers of Corporate investment banker Strain: Low Risk  (04/28/2022)   Overall Financial Resource Strain (CARDIA)    Difficulty of Paying Living Expenses: Not hard at all  Food Insecurity: No Food Insecurity (  04/28/2022)   Hunger Vital Sign    Worried About Running Out of Food in the Last Year: Never true    Ran Out of Food in the Last Year: Never true  Transportation Needs: No Transportation Needs (04/28/2022)   PRAPARE - Administrator, Civil Service (Medical): No    Lack of Transportation (Non-Medical): No  Physical Activity: Sufficiently Active (04/28/2022)   Exercise Vital Sign    Days of Exercise per Week: 5 days    Minutes of Exercise per Session: 30 min  Stress: No Stress Concern Present (04/28/2022)   [employer] of Occupational Health - Occupational Stress Questionnaire    Feeling of Stress : Not at all  Social Connections: Unknown (04/28/2022)   Social Connection and Isolation Panel [NHANES]    Frequency of Communication with Friends and Family: More than three times a week    Frequency of Social  Gatherings with Friends and Family: More than three times a week    Attends Religious Services: Not on file    Active Member of Clubs or Organizations: Not on file    Attends Banker Meetings: Not on file    Marital Status: Not on file  Intimate Partner Violence: Not At Risk (04/28/2022)   Humiliation, Afraid, Rape, and Kick questionnaire    Fear of Current or Ex-Partner: No    Emotionally Abused: No    Physically Abused: No    Sexually Abused: No     Outpatient Medications Prior to Visit  Medication Sig Dispense Refill   acyclovir (ZOVIRAX) 400 MG tablet Take 1 tablet (400 mg total) by mouth 5 (five) times daily. 35 tablet 3   albuterol (VENTOLIN HFA) 108 (90 Base) MCG/ACT inhaler TAKE 2 PUFFS BY MOUTH EVERY 6 HOURS AS NEEDED FOR WHEEZE OR SHORTNESS OF BREATH 8.5 each 2   amLODipine (NORVASC) 5 MG tablet TAKE 1 TABLET (5 MG TOTAL) BY MOUTH DAILY. 90 tablet 1   atorvastatin (LIPITOR) 20 MG tablet TAKE 1 TABLET BY MOUTH EVERY DAY 90 tablet 3   benzonatate (TESSALON) 200 MG capsule Take 1 capsule (200 mg total) by mouth 3 (three) times daily as needed for cough. 60 capsule 1   calcium carbonate (OS-CAL) 600 MG TABS Take 600 mg by mouth 2 (two) times daily with a meal.     cyclobenzaprine (FLEXERIL) 5 MG tablet TAKE 1 TABLET BY MOUTH EVERYDAY AT BEDTIME 90 tablet 1   diclofenac Sodium (VOLTAREN) 1 % GEL Apply 2 g topically 4 (four) times daily. 100 g G   escitalopram (LEXAPRO) 20 MG tablet Take 20 mg by mouth daily.      estradiol (ESTRACE) 0.1 MG/GM vaginal cream Place vaginally.     fluticasone (FLONASE) 50 MCG/ACT nasal spray Place 2 sprays into both nostrils daily as needed for allergies or rhinitis. After nasal saline 48 mL 5   furosemide (LASIX) 20 MG tablet TAKE 1 TABLET (20 MG TOTAL) BY MOUTH AS NEEDED FOR EDEMA. 20 tablet 1   gabapentin (NEURONTIN) 100 MG capsule TAKE 1 CAPSULE (100 MG TOTAL) BY MOUTH 3 TIMES A DAY 270 capsule 3   latanoprost (XALATAN) 0.005 %  ophthalmic solution 1 drop at bedtime.     LORazepam (ATIVAN) 1 MG tablet TAKE 1 TABLET BY MOUTH TWICE A DAY AS DIRECTED FOR ANXIETY/PANIC  2   metoprolol succinate (TOPROL-XL) 50 MG 24 hr tablet TAKE 0.5 TABLETS (25 MG TOTAL) BY MOUTH DAILY. TAKE WITH OR IMMEDIATELY FOLLOWING A MEAL. 45 tablet  3   Multiple Vitamin (MULTIVITAMIN) tablet Take 1 tablet by mouth daily. Bariatric Vitamin     oxybutynin (DITROPAN-XL) 10 MG 24 hr tablet TAKE 1 TABLET BY MOUTH EVERYDAY AT BEDTIME 90 tablet 1   pantoprazole (PROTONIX) 40 MG tablet TAKE 1 TABLET BY MOUTH EVERY DAY 90 tablet 3   potassium chloride SA (KLOR-CON M) 20 MEQ tablet Take 1 tablet (20 mEq total) by mouth 2 (two) times daily. 10 tablet 0   traMADol (ULTRAM) 50 MG tablet Take 1 tablet (50 mg total) by mouth every 12 (twelve) hours as needed. 60 tablet 4   VITAMIN D PO Take by mouth.     zolpidem (AMBIEN) 10 MG tablet Take 10 mg by mouth at bedtime as needed for sleep.     amoxicillin-clavulanate (AUGMENTIN) 875-125 MG tablet Take 1 tablet by mouth 2 (two) times daily. 14 tablet 0   doxycycline (VIBRA-TABS) 100 MG tablet Take 1 tablet (100 mg total) by mouth 2 (two) times daily. (Patient not taking: Reported on 03/03/2023) 20 tablet 0   guaiFENesin-codeine (VIRTUSSIN A/C) 100-10 MG/5ML syrup Take 5 mLs by mouth 3 (three) times daily as needed for cough. (Patient not taking: Reported on 03/03/2023) 120 mL 0   No facility-administered medications prior to visit.    Allergies  Allergen Reactions   Bacitracin-Polymyxin B Rash   Egg-Derived Products    Povidone Iodine Rash and Other (See Comments)    ROS Review of Systems Negative unless indicated in HPI.    Objective:    Physical Exam Constitutional:      Appearance: Normal appearance.  HENT:     Mouth/Throat:     Mouth: Mucous membranes are moist.  Cardiovascular:     Rate and Rhythm: Normal rate and regular rhythm.     Pulses: Normal pulses.     Heart sounds: Normal heart sounds.   Abdominal:     General: Bowel sounds are normal.     Palpations: Abdomen is soft.     Tenderness: There is no abdominal tenderness. There is no right CVA tenderness or left CVA tenderness.  Musculoskeletal:     Cervical back: Normal range of motion.  Neurological:     General: No focal deficit present.     Mental Status: She is alert. Mental status is at baseline.  Psychiatric:        Mood and Affect: Mood normal.        Behavior: Behavior normal.        Thought Content: Thought content normal.        Judgment: Judgment normal.     BP 121/78   Pulse 87   Temp 98.7 F (37.1 C) (Oral)   Ht 5\' 5"  (1.651 m)   Wt 182 lb 9.6 oz (82.8 kg)   BMI 30.39 kg/m  Wt Readings from Last 3 Encounters:  03/03/23 182 lb 9.6 oz (82.8 kg)  02/01/23 175 lb (79.4 kg)  01/25/23 175 lb (79.4 kg)     Health Maintenance  Topic Date Due   Zoster Vaccines- Shingrix (1 of 2) Never done   DEXA SCAN  Never done   DTaP/Tdap/Td (3 - Td or Tdap) 07/13/2022   OPHTHALMOLOGY EXAM  09/12/2022   COVID-19 Vaccine (5 - 2024-25 season) 11/13/2022   HEMOGLOBIN A1C  12/29/2022   Medicare Annual Wellness (AWV)  04/29/2023   MAMMOGRAM  04/29/2023 (Originally 02/25/2017)   INFLUENZA VACCINE  06/12/2023 (Originally 10/13/2022)   Diabetic kidney evaluation - eGFR measurement  06/29/2023  Diabetic kidney evaluation - Urine ACR  06/29/2023   Colonoscopy  11/14/2032   Pneumonia Vaccine 70+ Years old  Completed   Hepatitis C Screening  Completed   HPV VACCINES  Aged Out    There are no preventive care reminders to display for this patient.  Lab Results  Component Value Date   TSH 0.65 06/29/2022   Lab Results  Component Value Date   WBC 5.8 03/03/2023   HGB 10.9 (L) 03/03/2023   HCT 34.4 03/03/2023   MCV 89 03/03/2023   PLT 230 03/03/2023   Lab Results  Component Value Date   NA 141 06/29/2022   K 3.7 06/29/2022   CO2 29 06/29/2022   GLUCOSE 76 06/29/2022   BUN 8 06/29/2022   CREATININE 0.77  06/29/2022   BILITOT 0.5 06/29/2022   ALKPHOS 93 06/29/2022   AST 22 06/29/2022   ALT 34 06/29/2022   PROT 6.2 06/29/2022   ALBUMIN 3.6 06/29/2022   CALCIUM 8.9 06/29/2022   ANIONGAP 8 01/29/2021   EGFR 92 07/30/2021   GFR 79.77 06/29/2022   Lab Results  Component Value Date   CHOL 158 06/29/2022   Lab Results  Component Value Date   HDL 86 06/29/2022   Lab Results  Component Value Date   LDLCALC 59 06/29/2022   Lab Results  Component Value Date   TRIG 54 06/29/2022   Lab Results  Component Value Date   CHOLHDL 1.8 06/29/2022   Lab Results  Component Value Date   HGBA1C 6.4 06/29/2022      Assessment & Plan:  Diarrhea, unspecified type Assessment & Plan: Advised patient for proper hydration and consuming Pedialyte or Gatorade. Will check CBCs, CMP, inflammation markers and stool culture. Patient will let us know if symptoms not improving.  Orders: -     CBC -     C-reactive protein -     POCT Urinalysis Dipstick (Automated) -     Stool culture; Future    Follow-up: Return if symptoms worsen or fail to improve.   Kara Dies, NP

## 2023-03-04 LAB — C-REACTIVE PROTEIN: CRP: <1 mg/L (ref 0–10)

## 2023-03-04 LAB — CBC
Hematocrit: 34.4 % (ref 34.0–46.6)
Hemoglobin: 10.9 g/dL — ABNORMAL LOW (ref 11.1–15.9)
MCH: 28.2 pg (ref 26.6–33.0)
MCHC: 31.7 g/dL (ref 31.5–35.7)
MCV: 89 fL (ref 79–97)
Platelets: 230 10*3/uL (ref 150–450)
RBC: 3.87 x10E6/uL (ref 3.77–5.28)
RDW: 12.6 % (ref 11.7–15.4)
WBC: 5.8 10*3/uL (ref 3.4–10.8)

## 2023-03-09 ENCOUNTER — Telehealth: Payer: Self-pay | Admitting: Internal Medicine

## 2023-03-09 NOTE — Telephone Encounter (Signed)
noted 

## 2023-03-09 NOTE — Telephone Encounter (Unsigned)
Copied from CRM [id number]. Topic: General - Other >> Mar 09, 2023  4:31 PM Leavy Cella D wrote: Reason for CRM: Patient called to inform office that she has dropped off her specimen in the office dropbox.

## 2023-03-10 DIAGNOSIS — R197 Diarrhea, unspecified: Secondary | ICD-10-CM | POA: Diagnosis not present

## 2023-03-13 DIAGNOSIS — R197 Diarrhea, unspecified: Secondary | ICD-10-CM | POA: Insufficient documentation

## 2023-03-13 NOTE — Assessment & Plan Note (Signed)
Advised patient for proper hydration and consuming Pedialyte or Gatorade. Will check CBCs, CMP, inflammation markers and stool culture. Patient will let us know if symptoms not improving.

## 2023-03-14 LAB — STOOL CULTURE
E coli, Shiga toxin Assay: NEGATIVE
Stool Culture result 1 (CMPCXR): NEGATIVE

## 2023-03-21 ENCOUNTER — Encounter: Payer: Self-pay | Admitting: Nurse Practitioner

## 2023-03-31 DIAGNOSIS — Z961 Presence of intraocular lens: Secondary | ICD-10-CM | POA: Diagnosis not present

## 2023-03-31 DIAGNOSIS — H16223 Keratoconjunctivitis sicca, not specified as Sjogren's, bilateral: Secondary | ICD-10-CM | POA: Diagnosis not present

## 2023-03-31 DIAGNOSIS — H26491 Other secondary cataract, right eye: Secondary | ICD-10-CM | POA: Diagnosis not present

## 2023-03-31 DIAGNOSIS — H401131 Primary open-angle glaucoma, bilateral, mild stage: Secondary | ICD-10-CM | POA: Diagnosis not present

## 2023-04-05 ENCOUNTER — Telehealth: Payer: Self-pay | Admitting: Internal Medicine

## 2023-04-05 ENCOUNTER — Ambulatory Visit: Payer: Medicare Other | Admitting: Internal Medicine

## 2023-04-05 NOTE — Telephone Encounter (Signed)
Office will be closing early, called and unable to leave VM as box was full. A mychart was also sent regarding the appointment being Canceled.

## 2023-04-06 ENCOUNTER — Encounter: Payer: Self-pay | Admitting: Oncology

## 2023-04-07 ENCOUNTER — Ambulatory Visit: Payer: Medicare Other | Admitting: Nurse Practitioner

## 2023-04-11 ENCOUNTER — Telehealth: Payer: Self-pay

## 2023-04-11 ENCOUNTER — Other Ambulatory Visit: Payer: Self-pay

## 2023-04-11 ENCOUNTER — Encounter: Payer: Self-pay | Admitting: Nurse Practitioner

## 2023-04-11 ENCOUNTER — Ambulatory Visit (INDEPENDENT_AMBULATORY_CARE_PROVIDER_SITE_OTHER): Payer: Medicare Other | Admitting: Nurse Practitioner

## 2023-04-11 VITALS — BP 134/84 | HR 87 | Temp 97.9°F | Ht 65.0 in | Wt 178.6 lb

## 2023-04-11 DIAGNOSIS — J4521 Mild intermittent asthma with (acute) exacerbation: Secondary | ICD-10-CM

## 2023-04-11 DIAGNOSIS — J069 Acute upper respiratory infection, unspecified: Secondary | ICD-10-CM | POA: Diagnosis not present

## 2023-04-11 DIAGNOSIS — K5909 Other constipation: Secondary | ICD-10-CM

## 2023-04-11 MED ORDER — PREDNISONE 20 MG PO TABS: 40.0000 mg | ORAL_TABLET | Freq: Every day | ORAL | 0 refills | Status: AC

## 2023-04-11 MED ORDER — ACYCLOVIR 400 MG PO TABS: 400.0000 mg | ORAL_TABLET | Freq: Every day | ORAL | 3 refills | Status: AC

## 2023-04-11 MED ORDER — AMOXICILLIN-POT CLAVULANATE 875-125 MG PO TABS: 1.0000 | ORAL_TABLET | Freq: Two times a day (BID) | ORAL | 0 refills | Status: AC

## 2023-04-11 MED ORDER — FUROSEMIDE 20 MG PO TABS: 20.0000 mg | ORAL_TABLET | ORAL | 1 refills | Status: DC | PRN

## 2023-04-11 NOTE — Progress Notes (Signed)
Established Patient Office Visit  Subjective:  Patient ID: Sherri Lee, female    DOB: 12/05/1955  Age: 68 y.o. MRN: [id number]  CC:  Chief Complaint  Patient presents with   Acute Visit    Discuss labs regarding anemia  Head congestion, cough & no energy    HPI  Sherri Lee presents with congestion and changes in bowel movements.  She has been experiencing congestion since Saturday night, primarily located behind her eyes and ears, with occasional coughing. She has been using over-the-counter cough syrup, Tylenol, and ibuprofen for symptom relief.  Since November, she has noticed significant changes in her bowel movements following an illness. Previously regular, she now experiences constipation, going several days without a bowel movement. When she does have a bowel movement, the stool is described as 'little bitty strands' and 'green and black,' with a consistency more like pudding. No blood in her stool or urine. She had a colonoscopy in September, prior to these changes. She takes probiotics, yogurt, and iron supplements, which she started after seeing her blood test results. Iron supplementation may contribute to her constipation and stool color changes.  She has been under significant stress due to recent family deaths, caregiving for her elderly mother, and the loss of her boyfriend to cancer. She describes feeling 'totally burnt out' and acknowledges that her emotional state may be affecting her physical health. Her diet is poor, lacking in fruits and vegetables, and she does not drink much water, preferring coffee, tea, and soft drinks.  HPI   Past Medical History:  Diagnosis Date   Anemia    Low iron and hemoglobin   Anginal pain (HCC)    states she thinks it is stress   Anxiety    Asthma    COVID-19    07/05/20, 03/2021   Depression    Dysrhythmia    states she has PVC's   Fibromyalgia    Hypertension    Recurrent vomiting 02/04/2018    Schatzki's ring    dialated 3 times by Markham Jordan     Past Surgical History:  Procedure Laterality Date   ABDOMINAL ADHESION SURGERY     BALLOON DILATION  11/15/2022   Procedure: BALLOON DILATION;  Surgeon: Midge Minium, MD;  Location: [facility] ENDOSCOPY;  Service: Endoscopy;;   BUNIONECTOMY     left foot    CERVICAL FUSION     CESAREAN SECTION     CHOLECYSTECTOMY  June 2013   COLONOSCOPY WITH PROPOFOL N/A 11/15/2022   Procedure: COLONOSCOPY WITH PROPOFOL;  Surgeon: Midge Minium, MD;  Location: [facility] ENDOSCOPY;  Service: Endoscopy;  Laterality: N/A;   ESOPHAGOGASTRODUODENOSCOPY (EGD) WITH PROPOFOL N/A 11/15/2022   Procedure: ESOPHAGOGASTRODUODENOSCOPY (EGD) WITH PROPOFOL;  Surgeon: Midge Minium, MD;  Location: [facility] ENDOSCOPY;  Service: Endoscopy;  Laterality: N/A;   GASTRIC BYPASS      Family History  Problem Relation Age of Onset   Hypertension Mother    Glaucoma Mother    Heart disease Father    Stroke Father    Breast cancer Maternal Aunt    Cancer Maternal Aunt    Colon cancer Neg Hx    Ovarian cancer Neg Hx     Social History   Socioeconomic History   Marital status: Divorced    Spouse name: Not on file   Number of children: Not on file   Years of education: Not on file   Highest education level: Not on file  Occupational History   Not on file  Tobacco  Use   Smoking status: Never   Smokeless tobacco: Never  Vaping Use   Vaping status: Never Used  Substance and Sexual Activity   Alcohol use: Yes    Alcohol/week: 3.0 standard drinks of alcohol    Types: 3 Glasses of wine per week   Drug use: No   Sexual activity: Yes  Other Topics Concern   Not on file  Social History Narrative   Lives with spouse, son, and stepson who has serious psychiatric issues and alcohol abuse   Social Drivers of Corporate investment banker Strain: Low Risk  (04/28/2022)   Overall Financial Resource Strain (CARDIA)    Difficulty of Paying Living Expenses: Not hard at all  Food Insecurity:  No Food Insecurity (04/28/2022)   Hunger Vital Sign    Worried About Running Out of Food in the Last Year: Never true    Ran Out of Food in the Last Year: Never true  Transportation Needs: No Transportation Needs (04/28/2022)   PRAPARE - Administrator, Civil Service (Medical): No    Lack of Transportation (Non-Medical): No  Physical Activity: Sufficiently Active (04/28/2022)   Exercise Vital Sign    Days of Exercise per Week: 5 days    Minutes of Exercise per Session: 30 min  Stress: No Stress Concern Present (04/28/2022)   [employer] of Occupational Health - Occupational Stress Questionnaire    Feeling of Stress : Not at all  Social Connections: Unknown (04/28/2022)   Social Connection and Isolation Panel [NHANES]    Frequency of Communication with Friends and Family: More than three times a week    Frequency of Social Gatherings with Friends and Family: More than three times a week    Attends Religious Services: Not on file    Active Member of Clubs or Organizations: Not on file    Attends Banker Meetings: Not on file    Marital Status: Not on file  Intimate Partner Violence: Not At Risk (04/28/2022)   Humiliation, Afraid, Rape, and Kick questionnaire    Fear of Current or Ex-Partner: No    Emotionally Abused: No    Physically Abused: No    Sexually Abused: No     Outpatient Medications Prior to Visit  Medication Sig Dispense Refill   albuterol (VENTOLIN HFA) 108 (90 Base) MCG/ACT inhaler TAKE 2 PUFFS BY MOUTH EVERY 6 HOURS AS NEEDED FOR WHEEZE OR SHORTNESS OF BREATH 8.5 each 2   amLODipine (NORVASC) 5 MG tablet TAKE 1 TABLET (5 MG TOTAL) BY MOUTH DAILY. 90 tablet 1   atorvastatin (LIPITOR) 20 MG tablet TAKE 1 TABLET BY MOUTH EVERY DAY 90 tablet 3   benzonatate (TESSALON) 200 MG capsule Take 1 capsule (200 mg total) by mouth 3 (three) times daily as needed for cough. 60 capsule 1   cyclobenzaprine (FLEXERIL) 5 MG tablet TAKE 1 TABLET BY MOUTH  EVERYDAY AT BEDTIME 90 tablet 1   diclofenac Sodium (VOLTAREN) 1 % GEL Apply 2 g topically 4 (four) times daily. 100 g G   escitalopram (LEXAPRO) 20 MG tablet Take 20 mg by mouth daily.      fluticasone (FLONASE) 50 MCG/ACT nasal spray Place 2 sprays into both nostrils daily as needed for allergies or rhinitis. After nasal saline 48 mL 5   gabapentin (NEURONTIN) 100 MG capsule TAKE 1 CAPSULE (100 MG TOTAL) BY MOUTH 3 TIMES A DAY 270 capsule 3   latanoprost (XALATAN) 0.005 % ophthalmic solution 1 drop at bedtime.  LORazepam (ATIVAN) 1 MG tablet TAKE 1 TABLET BY MOUTH TWICE A DAY AS DIRECTED FOR ANXIETY/PANIC  2   metoprolol succinate (TOPROL-XL) 50 MG 24 hr tablet TAKE 0.5 TABLETS (25 MG TOTAL) BY MOUTH DAILY. TAKE WITH OR IMMEDIATELY FOLLOWING A MEAL. 45 tablet 3   Multiple Vitamin (MULTIVITAMIN) tablet Take 1 tablet by mouth daily. Bariatric Vitamin     oxybutynin (DITROPAN-XL) 10 MG 24 hr tablet TAKE 1 TABLET BY MOUTH EVERYDAY AT BEDTIME 90 tablet 1   pantoprazole (PROTONIX) 40 MG tablet TAKE 1 TABLET BY MOUTH EVERY DAY 90 tablet 3   potassium chloride SA (KLOR-CON M) 20 MEQ tablet Take 1 tablet (20 mEq total) by mouth 2 (two) times daily. 10 tablet 0   traMADol (ULTRAM) 50 MG tablet Take 1 tablet (50 mg total) by mouth every 12 (twelve) hours as needed. 60 tablet 4   VITAMIN D PO Take by mouth.     zolpidem (AMBIEN) 10 MG tablet Take 10 mg by mouth at bedtime as needed for sleep.     acyclovir (ZOVIRAX) 400 MG tablet Take 1 tablet (400 mg total) by mouth 5 (five) times daily. 35 tablet 3   estradiol (ESTRACE) 0.1 MG/GM vaginal cream Place vaginally.     furosemide (LASIX) 20 MG tablet TAKE 1 TABLET (20 MG TOTAL) BY MOUTH AS NEEDED FOR EDEMA. 20 tablet 1   calcium carbonate (OS-CAL) 600 MG TABS Take 600 mg by mouth 2 (two) times daily with a meal. (Patient not taking: Reported on 04/11/2023)     guaiFENesin-codeine (VIRTUSSIN A/C) 100-10 MG/5ML syrup Take 5 mLs by mouth 3 (three) times daily  as needed for cough. (Patient not taking: Reported on 04/11/2023) 120 mL 0   amoxicillin-clavulanate (AUGMENTIN) 875-125 MG tablet Take 1 tablet by mouth 2 (two) times daily. (Patient not taking: Reported on 04/11/2023) 14 tablet 0   doxycycline (VIBRA-TABS) 100 MG tablet Take 1 tablet (100 mg total) by mouth 2 (two) times daily. (Patient not taking: Reported on 04/11/2023) 20 tablet 0   No facility-administered medications prior to visit.    Allergies  Allergen Reactions   Bacitracin-Polymyxin B Rash   Egg-Derived Products    Povidone Iodine Rash and Other (See Comments)    ROS Review of Systems Negative unless indicated in HPI.    Objective:    Physical Exam Constitutional:      Appearance: Normal appearance.  HENT:     Right Ear: Tympanic membrane normal. Tympanic membrane is not erythematous.     Left Ear: Tympanic membrane normal. Tympanic membrane is not erythematous.     Nose:     Right Turbinates: Not enlarged.     Left Turbinates: Not enlarged.     Right Sinus: Maxillary sinus tenderness and frontal sinus tenderness present.     Left Sinus: Maxillary sinus tenderness and frontal sinus tenderness present.     Mouth/Throat:     Mouth: Mucous membranes are moist.     Pharynx: No pharyngeal swelling, oropharyngeal exudate or posterior oropharyngeal erythema.     Tonsils: No tonsillar exudate.  Cardiovascular:     Rate and Rhythm: Normal rate and regular rhythm.     Pulses: Normal pulses.     Heart sounds: Normal heart sounds.  Pulmonary:     Effort: Pulmonary effort is normal.     Breath sounds: Normal breath sounds. No stridor. No wheezing.  Musculoskeletal:     Cervical back: Normal range of motion.  Neurological:     General:  No focal deficit present.     Mental Status: She is alert and oriented to person, place, and time. Mental status is at baseline.  Psychiatric:        Mood and Affect: Mood normal.        Behavior: Behavior normal.        Thought Content:  Thought content normal.        Judgment: Judgment normal.     BP 134/84   Pulse 87   Temp 97.9 F (36.6 C)   Ht 5\' 5"  (1.651 m)   Wt 178 lb 9.6 oz (81 kg)   SpO2 96%   BMI 29.72 kg/m  Wt Readings from Last 3 Encounters:  04/11/23 178 lb 9.6 oz (81 kg)  03/03/23 182 lb 9.6 oz (82.8 kg)  02/01/23 175 lb (79.4 kg)     Health Maintenance  Topic Date Due   Zoster Vaccines- Shingrix (1 of 2) Never done   DEXA SCAN  Never done   DTaP/Tdap/Td (3 - Td or Tdap) 07/13/2022   OPHTHALMOLOGY EXAM  09/12/2022   HEMOGLOBIN A1C  12/29/2022   Medicare Annual Wellness (AWV)  04/29/2023   COVID-19 Vaccine (5 - 2024-25 season) 04/23/2023 (Originally 11/13/2022)   MAMMOGRAM  04/29/2023 (Originally 02/25/2017)   INFLUENZA VACCINE  06/12/2023 (Originally 10/13/2022)   Diabetic kidney evaluation - Urine ACR  06/29/2023   Diabetic kidney evaluation - eGFR measurement  04/10/2024   Colonoscopy  11/14/2032   Pneumonia Vaccine 40+ Years old  Completed   Hepatitis C Screening  Completed   HPV VACCINES  Aged Out    There are no preventive care reminders to display for this patient.  Lab Results  Component Value Date   TSH 0.65 06/29/2022   Lab Results  Component Value Date   WBC 5.8 03/03/2023   HGB 10.9 (L) 03/03/2023   HCT 34.4 03/03/2023   MCV 89 03/03/2023   PLT 230 03/03/2023   Lab Results  Component Value Date   NA 142 04/11/2023   K 3.5 04/11/2023   CO2 29 04/11/2023   GLUCOSE 78 04/11/2023   BUN 7 04/11/2023   CREATININE 0.71 04/11/2023   BILITOT 0.4 04/11/2023   ALKPHOS 75 04/11/2023   AST 34 04/11/2023   ALT 28 04/11/2023   PROT 6.6 04/11/2023   ALBUMIN 3.7 04/11/2023   CALCIUM 9.1 04/11/2023   ANIONGAP 8 01/29/2021   EGFR 92 07/30/2021   GFR 87.44 04/11/2023   Lab Results  Component Value Date   CHOL 158 06/29/2022   Lab Results  Component Value Date   HDL 86 06/29/2022   Lab Results  Component Value Date   LDLCALC 59 06/29/2022   Lab Results  Component  Value Date   TRIG 54 06/29/2022   Lab Results  Component Value Date   CHOLHDL 1.8 06/29/2022   Lab Results  Component Value Date   HGBA1C 6.4 06/29/2022      Assessment & Plan:  Intermittent constipation Assessment & Plan: Reports change in bowel movements since illness in November, with intermittent constipation and stools that are not fully formed, green and black in color. Patient has been taking iron supplements which may be contributing to the change in stool color and consistency. -Order celiac panel and metabolic panel to investigate cause. -Advise patient to increase water and fiber intake, and to take iron supplements with orange juice to increase absorption.  Orders: -     Gliadin antibodies, serum -     Tissue  transglutaminase, IgA -     Comprehensive metabolic panel -     Celiac Disease Panel  URI with cough and congestion Assessment & Plan: Presents with congestion, cough, and discomfort behind the eyes and at the temples. -Start antibiotic and prednisone to manage symptoms.   Other orders -     Furosemide; Take 1 tablet (20 mg total) by mouth as needed for edema.  Dispense: 20 tablet; Refill: 1 -     Acyclovir; Take 1 tablet (400 mg total) by mouth 5 (five) times daily.  Dispense: 35 tablet; Refill: 3 -     Amoxicillin-Pot Clavulanate; Take 1 tablet by mouth 2 (two) times daily for 7 days.  Dispense: 14 tablet; Refill: 0 -     predniSONE; Take 2 tablets (40 mg total) by mouth daily with breakfast for 5 days.  Dispense: 10 tablet; Refill: 0    Follow-up: No follow-ups on file.   Kara Dies, NP

## 2023-04-11 NOTE — Patient Instructions (Signed)
VISIT SUMMARY:  You came in today with concerns about congestion and changes in your bowel movements. We discussed your symptoms, including the congestion behind your eyes and ears, and the changes in your bowel habits since November. We also talked about your current stress levels and how they might be affecting your health.  YOUR PLAN:  -UPPER RESPIRATORY INFECTION: An upper respiratory infection is a condition that affects the nose, throat, and airways, causing symptoms like congestion and cough. We will start you on an antibiotic and prednisone to help manage your symptoms.  -ALTERED BOWEL MOVEMENTS: Changes in bowel movements can be due to various factors, including diet, stress, and medications. We will order a celiac panel and metabolic panel to investigate the cause. In the meantime, try to increase your water and fiber intake, and take your iron supplements with orange juice to help with absorption.  -ANEMIA: Anemia is a condition where you don't have enough healthy red blood cells to carry adequate oxygen to your body's tissues. We will provide you with a list of iron-rich foods to help improve your iron levels.  -GENERAL HEALTH MAINTENANCE: For your general health, you need to receive shingles and tetanus vaccines. You can get these vaccines at the pharmacy.  -MENTAL HEALTH: High levels of stress and anxiety can significantly impact your physical health. You are currently seeing a therapist twice a year, but we recommend considering increasing the frequency of your therapy sessions.  INSTRUCTIONS:  Please follow up with the recommended celiac panel and metabolic panel tests. Additionally, make sure to get your shingles and tetanus vaccines at the pharmacy. Consider scheduling more frequent therapy sessions to help manage your stress and anxiety.

## 2023-04-12 LAB — COMPREHENSIVE METABOLIC PANEL
ALT: 28 U/L (ref 0–35)
AST: 34 U/L (ref 0–37)
Albumin: 3.7 g/dL (ref 3.5–5.2)
Alkaline Phosphatase: 75 U/L (ref 39–117)
BUN: 7 mg/dL (ref 6–23)
CO2: 29 meq/L (ref 19–32)
Calcium: 9.1 mg/dL (ref 8.4–10.5)
Chloride: 106 meq/L (ref 96–112)
Creatinine, Ser: 0.71 mg/dL (ref 0.40–1.20)
GFR: 87.44 mL/min (ref >60.00)
Glucose, Bld: 78 mg/dL (ref 70–99)
Potassium: 3.5 meq/L (ref 3.5–5.1)
Sodium: 142 meq/L (ref 135–145)
Total Bilirubin: 0.4 mg/dL (ref 0.2–1.2)
Total Protein: 6.6 g/dL (ref 6.0–8.3)

## 2023-04-12 LAB — CELIAC DISEASE PANEL
(tTG) Ab, IgA: <1 U/mL
(tTG) Ab, IgG: <1 U/mL
Gliadin IgA: 7.3 U/mL
Gliadin IgG: <1 U/mL
Immunoglobulin A: 400 mg/dL — ABNORMAL HIGH (ref 70–320)

## 2023-04-12 LAB — TISSUE TRANSGLUTAMINASE, IGA: (tTG) Ab, IgA: <1 U/mL

## 2023-04-12 LAB — GLIADIN ANTIBODIES, SERUM
Gliadin IgA: 7.1 U/mL
Gliadin IgG: <1 U/mL

## 2023-04-12 MED ORDER — ESTRADIOL 0.1 MG/GM VA CREA: 1.0000 | TOPICAL_CREAM | VAGINAL | 11 refills | Status: AC

## 2023-04-12 NOTE — Telephone Encounter (Signed)
error

## 2023-04-13 ENCOUNTER — Encounter: Payer: Self-pay | Admitting: Nurse Practitioner

## 2023-04-14 DIAGNOSIS — J069 Acute upper respiratory infection, unspecified: Secondary | ICD-10-CM | POA: Insufficient documentation

## 2023-04-14 DIAGNOSIS — K5909 Other constipation: Secondary | ICD-10-CM | POA: Insufficient documentation

## 2023-04-14 NOTE — Assessment & Plan Note (Addendum)
Presents with congestion, cough, and discomfort behind the eyes and at the temples. -Start antibiotic and prednisone to manage symptoms.

## 2023-04-14 NOTE — Assessment & Plan Note (Signed)
Reports change in bowel movements since illness in November, with intermittent constipation and stools that are not fully formed, green and black in color. Patient has been taking iron supplements which may be contributing to the change in stool color and consistency. -Order celiac panel and metabolic panel to investigate cause. -Advise patient to increase water and fiber intake, and to take iron supplements with orange juice to increase absorption.

## 2023-04-17 ENCOUNTER — Other Ambulatory Visit: Payer: Self-pay | Admitting: Internal Medicine

## 2023-04-17 DIAGNOSIS — M5030 Other cervical disc degeneration, unspecified cervical region: Secondary | ICD-10-CM

## 2023-04-17 DIAGNOSIS — M5134 Other intervertebral disc degeneration, thoracic region: Secondary | ICD-10-CM

## 2023-04-17 MED ORDER — TRAMADOL HCL 50 MG PO TABS: 50.0000 mg | ORAL_TABLET | Freq: Two times a day (BID) | ORAL | 4 refills | Status: DC | PRN

## 2023-04-17 NOTE — Telephone Encounter (Signed)
Copied from CRM [id number]. Topic: Clinical - Medication Refill >> Apr 17, 2023  2:49 PM Corin V wrote: Most Recent Primary Care Visit:  Provider: Kara Dies  Department: [facility]  Visit Type: ACUTE  Date: 04/11/2023  Medication: traMADol (ULTRAM) 50 MG tablet  Has the patient contacted their pharmacy? Yes- no refills left (Agent: If no, request that the patient contact the pharmacy for the refill. If patient does not wish to contact the pharmacy document the reason why and proceed with request.) (Agent: If yes, when and what did the pharmacy advise?)  Is this the correct pharmacy for this prescription? Yes If no, delete pharmacy and type the correct one.  This is the patient's preferred pharmacy:  [id number] [place], [place] - [address] [place] [place] [address] Phone: [phone] Fax: [phone]    Has the prescription been filled recently? No  Is the patient out of the medication? Yes  Has the patient been seen for an appointment in the last year OR does the patient have an upcoming appointment? Yes- seen 04/11/23  Can we respond through MyChart? Yes  Agent: Please be advised that Rx refills may take up to 3 business days. We ask that you follow-up with your pharmacy.

## 2023-04-18 ENCOUNTER — Other Ambulatory Visit: Payer: Self-pay | Admitting: Nurse Practitioner

## 2023-04-19 ENCOUNTER — Encounter: Payer: Self-pay | Admitting: Internal Medicine

## 2023-04-19 ENCOUNTER — Ambulatory Visit: Payer: Medicare Other | Admitting: Internal Medicine

## 2023-04-19 ENCOUNTER — Ambulatory Visit: Payer: Self-pay | Admitting: Internal Medicine

## 2023-04-19 VITALS — BP 122/82 | HR 87 | Temp 97.9°F | Ht 65.0 in | Wt 178.8 lb

## 2023-04-19 DIAGNOSIS — R197 Diarrhea, unspecified: Secondary | ICD-10-CM

## 2023-04-19 DIAGNOSIS — J069 Acute upper respiratory infection, unspecified: Secondary | ICD-10-CM | POA: Diagnosis not present

## 2023-04-19 MED ORDER — HYDROCOD POLI-CHLORPHE POLI ER 10-8 MG/5ML PO SUER: 5.0000 mL | Freq: Every evening | ORAL | 0 refills | Status: DC | PRN

## 2023-04-19 NOTE — Progress Notes (Signed)
 Acute Office Visit  Subjective:     Patient ID: Sherri Lee, female    DOB: 1955/04/10, 68 y.o.   MRN: [id number]  Chief Complaint  Patient presents with   Cough    Cough & congestion x 2 weeks    Cough Associated symptoms include ear pain and shortness of breath. Pertinent negatives include no chest pain, chills, fever, hemoptysis or sore throat.   Patient is in today for worsening cough and mild shortness of breath.  Patient states that approximately 2 weeks ago she developed cough and nasal/sinus congestion.  She followed up in clinic here and was prescribed prednisone  and Augmentin  by NP Vincente without any improvement in her symptoms.  Patient states that she still has some persistent congestion in her sinuses and nose but now has a worsening cough.  Cough is productive of yellowish phlegm.  No hemoptysis noted.  She completed her course of Augmentin  last night.  No fevers or chills.  She does state that she has a history of asthma and has noted some mild intermittent wheezing.  She also complains of some congestion behind the ears.  Of note, patient states that she has had issues with her bowel movements since November but since beginning the antibiotic she has had multiple soft consistency stools a day.  No melena or hematochezia noted.  Review of Systems  Constitutional:  Positive for malaise/fatigue. Negative for chills and fever.  HENT:  Positive for congestion, ear pain and sinus pain. Negative for ear discharge, nosebleeds and sore throat.   Respiratory:  Positive for cough, sputum production and shortness of breath. Negative for hemoptysis.   Cardiovascular:  Negative for chest pain.  Gastrointestinal:  Positive for diarrhea.        Objective:    BP 122/82   Pulse 87   Temp 97.9 F (36.6 C)   Ht 5' 5 (1.651 m)   Wt 178 lb 12.8 oz (81.1 kg)   SpO2 98%   BMI 29.75 kg/m    Physical Exam Constitutional:      Appearance: Normal appearance.  HENT:      Head: Normocephalic and atraumatic.     Right Ear: Tympanic membrane, ear canal and external ear normal.     Left Ear: Tympanic membrane, ear canal and external ear normal.     Nose: Congestion and rhinorrhea present.     Right Turbinates: Enlarged and swollen.     Left Turbinates: Enlarged and swollen.     Right Sinus: Frontal sinus tenderness present. No maxillary sinus tenderness.     Left Sinus: Frontal sinus tenderness present. No maxillary sinus tenderness.     Mouth/Throat:     Mouth: Mucous membranes are moist.     Pharynx: Oropharynx is clear. No oropharyngeal exudate or posterior oropharyngeal erythema.  Cardiovascular:     Rate and Rhythm: Normal rate and regular rhythm.     Heart sounds: Normal heart sounds.  Pulmonary:     Effort: Pulmonary effort is normal.     Breath sounds: Normal breath sounds. No wheezing, rhonchi or rales.  Abdominal:     General: Bowel sounds are normal. There is no distension.     Palpations: Abdomen is soft.     Tenderness: There is no abdominal tenderness.  Musculoskeletal:     Cervical back: Neck supple.  Lymphadenopathy:     Cervical: No cervical adenopathy.  Neurological:     Mental Status: She is alert.  Psychiatric:  Mood and Affect: Mood normal.        Behavior: Behavior normal.     No results found for any visits on 04/19/23.      Assessment & Plan:   Problem List Items Addressed This Visit       Respiratory   URI with cough and congestion - Primary   -Patient presents today with worsening cough, mild shortness of breath and persistent nasal and sinus congestion as well as rhinorrhea. -No fevers chills. -On exam today, lungs are clear to auscultation.  No cervical LAD noted.  Mild frontal sinus tenderness but no maxillary tenderness noted.  No pharyngeal erythema or exudate noted -Patient completed her course of Augmentin  yesterday.  I do not suspect that this is a bacterial infection.  She likely has a viral URI  with an associated bronchitis. -We will obtain a chest x-ray to rule out a developing pneumonia but I suspect this is less likely -Will prescribe Tussionex for her persistent cough to use at bedtime as needed -Conservative measures including Nettie pot, saline nasal rinses, steam inhalation were discussed -Explained to the patient that she likely has a viral infection and thus the reason of the antibiotics were not working.  We will try conservative measures for now.  Patient is in agreement with plan. -No further workup at this time.      Relevant Medications   chlorpheniramine-HYDROcodone  (TUSSIONEX) 10-8 MG/5ML   Other Relevant Orders   DG Chest 2 View     Other   Diarrhea   -Patient states that over the last 4 to 5 days she has noted multiple frequent bowel movements a day which are soft in consistency but not watery.  No melena or hematochezia noted. -She states that this is an acute change from her chronic bowel issues. -I suspect this likely antibiotic associated diarrhea secondary to Augmentin .  This should improve since she finished her course of antibiotics yesterday.  If this persists patient may need stool testing for C. difficile and other GI pathogens. -Patient did have celiac panel testing done which was negative except for an elevated IgA (nonspecific and likely elevated in the setting of an acute respiratory tract infection). -No further workup at this time. -Will continue to monitor closely. -No further workup at this time.       Meds ordered this encounter  Medications   chlorpheniramine-HYDROcodone  (TUSSIONEX) 10-8 MG/5ML    Sig: Take 5 mLs by mouth at bedtime as needed for cough.    Dispense:  115 mL    Refill:  0    No follow-ups on file.  Adal Sereno, MD

## 2023-04-19 NOTE — Patient Instructions (Signed)
-  It was a pleasure meeting you today -I suspect that your symptoms are secondary to a persistent viral upper respiratory tract infection -We will obtain a chest x-ray to rule out a developing pneumonia -You have completed a course of antibiotics already without any improvement in symptoms.  I do not suspect an underlying bacterial infection at this time and will hold off on further antibiotics for now. -We will prescribe a medication to help with your cough (Tussionex).  Please take this at night and do not operate heavy machinery including driving after taking this medication -Please obtain an over-the-counter Nettie pot and try to use this to help with congestion -You can use Mucinex  D during the day for the cough -You can also try saline rinses as well as steam inhalation with warm showers -I do believe that your diarrhea currently is secondary to your antibiotic use.  Since you have completed your course of antibiotics today I suspect that your diarrhea should improve over the next couple of days.  If this persist please call us  for follow-up as this may require further testing. -Please call us  if you have any questions or concerns or if you have persistent symptoms

## 2023-04-19 NOTE — Assessment & Plan Note (Signed)
-  Patient states that over the last 4 to 5 days she has noted multiple frequent bowel movements a day which are soft in consistency but not watery.  No melena or hematochezia noted. -She states that this is an acute change from her chronic bowel issues. -I suspect this likely antibiotic associated diarrhea secondary to Augmentin .  This should improve since she finished her course of antibiotics yesterday.  If this persists patient may need stool testing for C. difficile and other GI pathogens. -Patient did have celiac panel testing done which was negative except for an elevated IgA (nonspecific and likely elevated in the setting of an acute respiratory tract infection). -No further workup at this time. -Will continue to monitor closely. -No further workup at this time.

## 2023-04-19 NOTE — Assessment & Plan Note (Signed)
-  Patient presents today with worsening cough, mild shortness of breath and persistent nasal and sinus congestion as well as rhinorrhea. -No fevers chills. -On exam today, lungs are clear to auscultation.  No cervical LAD noted.  Mild frontal sinus tenderness but no maxillary tenderness noted.  No pharyngeal erythema or exudate noted -Patient completed her course of Augmentin  yesterday.  I do not suspect that this is a bacterial infection.  She likely has a viral URI with an associated bronchitis. -We will obtain a chest x-ray to rule out a developing pneumonia but I suspect this is less likely -Will prescribe Tussionex for her persistent cough to use at bedtime as needed -Conservative measures including Nettie pot, saline nasal rinses, steam inhalation were discussed -Explained to the patient that she likely has a viral infection and thus the reason of the antibiotics were not working.  We will try conservative measures for now.  Patient is in agreement with plan. -No further workup at this time.

## 2023-04-19 NOTE — Telephone Encounter (Signed)
 Chief Complaint: cough, difficulty breathing Symptoms: productive cough, difficulty breathing (mild SOB at rest) Frequency: 10 days of symptoms, seen for symptoms on 1/28 Pertinent Negatives: Patient denies CP, dizziness Disposition: [] ED /[] Urgent Care (no appt availability in office) / [x] Appointment(In office/virtual)/ []  [place] [facility]/ [] Home Care/ [] Refused Recommended Disposition /[] [place] [facility]/ []  Follow-up with PCP Additional Notes: Pt reports 10 days of cough and congestion. Pt states it moved from my head into my chest. Pt was seen on 1/28 and prescribed prednisone  and amoxicillin  and finished both and is not feeling better. Pt states she used to have asthma but asthma improved after weight loss. Pt endorses some wheezing and a little bit of SOB at rest, states she is using an albuterol  inhaler. NO CP. No N/V/D. Eating and drinking normally. No dizziness, weakness, or passing out. Pt vitals while on the phone with RN using a pulse ox - O2 96 and HR 90. Speaking in full sentences. Pt did not want to come into the office or go anywhere today, asked for antibiotics to be sent in. RN educated pt that she needs to be assessed again in person by a provider. RN called the CAL. CAL said the same. CAL scheduled the pt for 1300 with provider Nischal Narendra. Office staff says this provider is newer to the practice and sees acute patients - this doctor's name is not showing up for this RN in EPIC or KMS, but office staff scheduled pt for 1300. RN told pt she has an appt for 1pm at [facility] with Dr. Onesimo, pt agreeable to that plan. RN advised pt should call back if she worsens, pt verbalized understanding.   Copied from CRM [id number]. Topic: Clinical - Red Word Triage >> Apr 19, 2023 10:17 AM Leotis ORN wrote: [facility] that prompted transfer to Nurse Triage: patients congestion has moved to her chest and is having difficulty breathing along with a severe cough- patient  stated if her cough continues to progress she will have an asthma attack- patient finished her round of prednisone  and amoxicillin , she is wanting to know if something else can be sent in Reason for Disposition  [1] MILD difficulty breathing (e.g., minimal/no SOB at rest, SOB with walking, pulse <100) AND [2] NEW-onset or WORSE than normal  Answer Assessment - Initial Assessment Questions 1. RESPIRATORY STATUS: Describe your breathing? (e.g., wheezing, shortness of breath, unable to speak, severe coughing)      Some wheezing, a little bit of SOB at rest, severe cough (light yellow phlegm) 2. ONSET: When did this breathing problem begin?      At least 10 days 3. PATTERN Does the difficult breathing come and go, or has it been constant since it started?      Comes and goes 4. SEVERITY: How bad is your breathing? (e.g., mild, moderate, severe)    - MILD: No SOB at rest, mild SOB with walking, speaks normally in sentences, can lie down, no retractions, pulse < 100.    - MODERATE: SOB at rest, SOB with minimal exertion and prefers to sit, cannot lie down flat, speaks in phrases, mild retractions, audible wheezing, pulse 100-120.    - SEVERE: Very SOB at rest, speaks in single words, struggling to breathe, sitting hunched forward, retractions, pulse > 120      Using albuterol  inhaler more often, can breathe lying flat, but uses 3 pillows is more comfortable for breathing, pt speaking in full sentences 5. RECURRENT SYMPTOM: Have you had difficulty  breathing before? If Yes, ask: When was the last time? and What happened that time?      I used to have asthma, used to see a pulmonary doctor, then I lost weight and my breathing got better 6. CARDIAC HISTORY: Do you have any history of heart disease? (e.g., heart attack, angina, bypass surgery, angioplasty)      HTN, atherosclerosis 7. LUNG HISTORY: Do you have any history of lung disease?  (e.g., pulmonary embolus, asthma,  emphysema)     Used to have asthma - used to have a breathing machine and wear a CPAP, does not use anymore 8. CAUSE: What do you think is causing the breathing problem?      Not sure 9. OTHER SYMPTOMS: Do you have any other symptoms? (e.g., dizziness, runny nose, cough, chest pain, fever)     No CP. Maybe a low grade fever - I thought I was getting better and then it moved into my chest from my head. No N/V/D. Eating and drinking normally. No dizziness, weakness, or passing out. Pt does not feel better after prednisone  and amoxicillin  prescribed on 1/28. 10. O2 SATURATION MONITOR:  Do you use an oxygen saturation monitor (pulse oximeter) at home? If Yes, ask: What is your reading (oxygen level) today? What is your usual oxygen saturation reading? (e.g., 95%)       I have a thing to check it, but I don't use it. Pt said she will find the pulse ox and check her O2 for RN. O2 of 95-96. HR of 90.  Protocols used: Breathing Difficulty-A-AH

## 2023-04-19 NOTE — Telephone Encounter (Addendum)
 FYI-pt scheduled with Dr Sharia Daunt today at 1pm.   Rerouted message to Dr Sharia Daunt

## 2023-04-21 ENCOUNTER — Encounter: Payer: Self-pay | Admitting: *Deleted

## 2023-04-21 ENCOUNTER — Ambulatory Visit (INDEPENDENT_AMBULATORY_CARE_PROVIDER_SITE_OTHER): Payer: Medicare Other | Admitting: Nurse Practitioner

## 2023-04-21 ENCOUNTER — Encounter: Payer: Self-pay | Admitting: Nurse Practitioner

## 2023-04-21 ENCOUNTER — Ambulatory Visit
Admission: RE | Admit: 2023-04-21 | Discharge: 2023-04-21 | Disposition: A | Discharge status: Home / Self Care | Payer: Medicare Other | Source: Ambulatory Visit | Attending: Internal Medicine | Admitting: Internal Medicine

## 2023-04-21 ENCOUNTER — Ambulatory Visit
Admission: RE | Admit: 2023-04-21 | Discharge: 2023-04-21 | Disposition: A | Discharge status: Home / Self Care | Payer: Medicare Other | Attending: Internal Medicine | Admitting: Internal Medicine

## 2023-04-21 ENCOUNTER — Telehealth: Payer: Self-pay

## 2023-04-21 ENCOUNTER — Ambulatory Visit: Payer: Self-pay | Admitting: Internal Medicine

## 2023-04-21 ENCOUNTER — Telehealth: Payer: Self-pay | Admitting: Nurse Practitioner

## 2023-04-21 VITALS — BP 120/80 | HR 129 | Temp 98.6°F | Resp 17 | Ht 65.0 in | Wt 182.0 lb

## 2023-04-21 DIAGNOSIS — J069 Acute upper respiratory infection, unspecified: Secondary | ICD-10-CM | POA: Diagnosis not present

## 2023-04-21 DIAGNOSIS — B349 Viral infection, unspecified: Secondary | ICD-10-CM | POA: Diagnosis not present

## 2023-04-21 DIAGNOSIS — Z1152 Encounter for screening for COVID-19: Secondary | ICD-10-CM | POA: Diagnosis not present

## 2023-04-21 DIAGNOSIS — R059 Cough, unspecified: Secondary | ICD-10-CM | POA: Diagnosis not present

## 2023-04-21 DIAGNOSIS — R0602 Shortness of breath: Secondary | ICD-10-CM | POA: Diagnosis not present

## 2023-04-21 DIAGNOSIS — R509 Fever, unspecified: Secondary | ICD-10-CM | POA: Diagnosis not present

## 2023-04-21 NOTE — Telephone Encounter (Signed)
 Spoke with reading room and changed to STAT

## 2023-04-21 NOTE — Telephone Encounter (Signed)
 FYI

## 2023-04-21 NOTE — Telephone Encounter (Signed)
 Per report:  CLINICAL DATA:  Cough, shortness of breath.   EXAM: CHEST - 2 VIEW   COMPARISON:  March 02, 2022.   FINDINGS: The heart size and mediastinal contours are within normal limits. Both lungs are clear. The visualized skeletal structures are unremarkable.   IMPRESSION: No active cardiopulmonary disease.     Electronically Signed   By: Lynwood Landy Raddle M.D.   On: 04/21/2023 16:29

## 2023-04-21 NOTE — Progress Notes (Signed)
 Leron Glance, NP-C Phone: [phone]  Sherri Lee is a 68 y.o. female who presents today for fever and cough.   Discussed the use of AI scribe software for clinical note transcription with the patient, who gave verbal consent to proceed.  History of Present Illness   Sherri Lee is a 68 year old female with asthma who presents with persistent respiratory symptoms and fever.  In November, she began experiencing respiratory symptoms that persisted for three to four weeks with partial improvement but no full recovery. She was treated for a sinus infection 2 weeks ago with Augmentin  and a five-day course of prednisone , which was less than her usual tapering dose due to her asthma history. Despite these treatments, her symptoms recurred, initially affecting her sinuses and then progressing to her chest, causing congestion in both areas. She was evaluated 2 days ago in the office and diagnosed with a viral URI. She was prescribed cough medication and a chest x-ray was ordered. She has not completed the x-ray. She has a cough with some expectoration and mild shortness of breath.  Two nights ago, she developed fever and chills, describing them as 'shaking and freezing, hot and cold at the same time.' Her fever was measured at 100.46F, and she has been taking ibuprofen to manage it. She also experiences fatigue, body aches, headache, and nausea without vomiting.  She has a significant history of asthma, which has improved with weight loss over the years, but she still experiences wheezing. There is no current wheezing or crackles noted on lung examination, indicating stable asthma at this time.  She has been exposed to RSV recently, as her cousin, who was driving her around, tested positive for RSV. Additionally, she mentions that her father recently passed away, and she has been under considerable stress, which she feels may be affecting her recovery.      Social History    Tobacco Use  Smoking Status Never  Smokeless Tobacco Never    Current Outpatient Medications on File Prior to Visit  Medication Sig Dispense Refill   amLODipine  (NORVASC ) 5 MG tablet TAKE 1 TABLET (5 MG TOTAL) BY MOUTH DAILY. 90 tablet 1   atorvastatin  (LIPITOR) 20 MG tablet TAKE 1 TABLET BY MOUTH EVERY DAY 90 tablet 3   calcium  carbonate (OS-CAL) 600 MG TABS Take 600 mg by mouth 2 (two) times daily with a meal.     chlorpheniramine-HYDROcodone  (TUSSIONEX) 10-8 MG/5ML Take 5 mLs by mouth at bedtime as needed for cough. 115 mL 0   cyclobenzaprine  (FLEXERIL ) 5 MG tablet TAKE 1 TABLET BY MOUTH EVERYDAY AT BEDTIME 90 tablet 1   diclofenac  Sodium (VOLTAREN ) 1 % GEL Apply 2 g topically 4 (four) times daily. 100 g G   escitalopram (LEXAPRO) 20 MG tablet Take 20 mg by mouth daily.      estradiol  (ESTRACE ) 0.1 MG/GM vaginal cream Place 1 Applicatorful vaginally 3 (three) times a week. 42.5 g 11   fluticasone  (FLONASE ) 50 MCG/ACT nasal spray Place 2 sprays into both nostrils daily as needed for allergies or rhinitis. After nasal saline 48 mL 5   furosemide  (LASIX ) 20 MG tablet Take 1 tablet (20 mg total) by mouth as needed for edema. 20 tablet 1   gabapentin  (NEURONTIN ) 100 MG capsule TAKE 1 CAPSULE (100 MG TOTAL) BY MOUTH 3 TIMES A DAY 270 capsule 3   latanoprost (XALATAN) 0.005 % ophthalmic solution 1 drop at bedtime.     LORazepam  (ATIVAN ) 1 MG tablet TAKE 1 TABLET  BY MOUTH TWICE A DAY AS DIRECTED FOR ANXIETY/PANIC  2   metoprolol  succinate (TOPROL -XL) 50 MG 24 hr tablet TAKE 0.5 TABLETS (25 MG TOTAL) BY MOUTH DAILY. TAKE WITH OR IMMEDIATELY FOLLOWING A MEAL. 45 tablet 3   Multiple Vitamin (MULTIVITAMIN) tablet Take 1 tablet by mouth daily. Bariatric Vitamin     oxybutynin  (DITROPAN -XL) 10 MG 24 hr tablet TAKE 1 TABLET BY MOUTH EVERYDAY AT BEDTIME 90 tablet 1   pantoprazole  (PROTONIX ) 40 MG tablet TAKE 1 TABLET BY MOUTH EVERY DAY 90 tablet 3   potassium chloride  SA (KLOR-CON  M) 20 MEQ tablet  Take 1 tablet (20 mEq total) by mouth 2 (two) times daily. 10 tablet 0   traMADol  (ULTRAM ) 50 MG tablet Take 1 tablet (50 mg total) by mouth every 12 (twelve) hours as needed. 60 tablet 4   VITAMIN D  PO Take by mouth.     zolpidem (AMBIEN) 10 MG tablet Take 10 mg by mouth at bedtime as needed for sleep.     acyclovir  (ZOVIRAX ) 400 MG tablet Take 1 tablet (400 mg total) by mouth 5 (five) times daily. (Patient not taking: Reported on 04/21/2023) 35 tablet 3   No current facility-administered medications on file prior to visit.     ROS see history of present illness  Objective  Physical Exam Vitals:   04/21/23 1351  BP: 120/80  Pulse: (!) 129  Resp: 17  Temp: 98.6 F (37 C)  SpO2: 99%    BP Readings from Last 3 Encounters:  04/21/23 120/80  04/19/23 122/82  04/11/23 134/84   Wt Readings from Last 3 Encounters:  04/21/23 182 lb (82.6 kg)  04/19/23 178 lb 12.8 oz (81.1 kg)  04/11/23 178 lb 9.6 oz (81 kg)    Physical Exam Vitals reviewed.  Constitutional:      General: She is not in acute distress.    Appearance: Normal appearance.  HENT:     Head: Normocephalic.     Right Ear: Tympanic membrane normal.     Left Ear: Tympanic membrane normal.     Nose: Nose normal.     Mouth/Throat:     Mouth: Mucous membranes are moist.     Pharynx: Oropharynx is clear.  Eyes:     Conjunctiva/sclera: Conjunctivae normal.     Pupils: Pupils are equal, round, and reactive to light.  Cardiovascular:     Rate and Rhythm: Regular rhythm. Tachycardia present.     Heart sounds: Normal heart sounds.  Pulmonary:     Effort: Pulmonary effort is normal.     Breath sounds: Normal breath sounds.  Abdominal:     General: Abdomen is flat. Bowel sounds are normal.     Palpations: Abdomen is soft. There is no mass.     Tenderness: There is no abdominal tenderness.  Lymphadenopathy:     Cervical: No cervical adenopathy.  Skin:    General: Skin is warm and dry.  Neurological:     General: No  focal deficit present.     Mental Status: She is alert.  Psychiatric:        Mood and Affect: Mood normal.        Behavior: Behavior normal.    Assessment/Plan: Please see individual problem list.  Viral illness Assessment & Plan: Persistent symptoms since November with recent fever and chills. She completed antibiotics and prednisone  earlier this week. There is a history of asthma and recent exposure to RSV, though lungs are clear on auscultation. Complete previously ordered chest  x-ray and send a swab for COVID, flu, and RSV. Continue over-the-counter medications, Tylenol  and Ibuprofen as needed, increase fluid intake, and rest. Further work up pending results. Return precautions given to patient.    Encounter for screening for COVID-19 -     COVID-19, Flu A+B and RSV   No follow-ups on file.   Leron Glance, NP-C [facility]

## 2023-04-21 NOTE — Telephone Encounter (Signed)
 Chief Complaint: Fever Symptoms: Chills, congestion, cough, headache, a little sore throat Frequency: Intermittent Pertinent Negatives: Patient denies abdomen pain, diarrhea, earache, urination pain Disposition: [] ED /[] Urgent Care (no appt availability in office) / [x] Appointment(In office/virtual)/ []  [place] [facility]/ [] Home Care/ [] Refused Recommended Disposition /[] [place] [facility]/ []  Follow-up with PCP Additional Notes: Pt calls about a fever that started the night before last. Pt states she got sick in Nov and never got completely well. Pt has been trying to take med and ibuprofen but chills come back. Pt made an appt for today. CAL contacted to let them know appt was soon. Pt verbalized understanding and agrees to plan.     Copied from CRM [id number]. Topic: Clinical - Red Word Triage >> Apr 21, 2023  1:20 PM Sherri Lee wrote: Red Word that prompted transfer to Nurse Triage: Running fever 100.5 / chills Reason for Disposition  Severe chills (i.e., feeling extremely cold WITH shaking chills)  Answer Assessment - Initial Assessment Questions 1. TEMPERATURE: What is the most recent temperature?  How was it measured?      100.55F 2. ONSET: When did the fever start?      The night before last 3. CHILLS: Do you have chills? If yes: How bad are they?  (e.g., none, mild, moderate, severe)   - NONE: no chills   - MILD: feeling cold   - MODERATE: feeling very cold, some shivering (feels better under a thick blanket)   - SEVERE: feeling extremely cold with shaking chills (general body shaking, rigors; even under a thick blanket)      Moderate 4. OTHER SYMPTOMS: Do you have any other symptoms besides the fever?  (e.g., abdomen pain, cough, diarrhea, earache, headache, sore throat, urination pain)     Cough  Protocols used: Fever-A-AH

## 2023-04-21 NOTE — Telephone Encounter (Signed)
 Copied from CRM [id number]. Topic: Clinical - Request for Lab/Test Order >> Apr 21, 2023  3:24 PM China J wrote: Reason for CRM: Patient came in and saw Gretel App for imaging that needed to be done. Provider advised patient that the order would be marked as urgent in order to give the patient results same day, but the order was never marked urgent and instead, listed as routine. Patient needs this urgently updated.

## 2023-04-21 NOTE — Telephone Encounter (Signed)
 Copied from CRM [id number]. Topic: Clinical - Request for Lab/Test Order >> Apr 21, 2023  1:20 PM Deaijah H wrote: Reason for CRM: Needs an order for chest xray / please call [phone]

## 2023-04-21 NOTE — Assessment & Plan Note (Addendum)
 Persistent symptoms since November with recent fever and chills. She completed antibiotics and prednisone  earlier this week. There is a history of asthma and recent exposure to RSV, though lungs are clear on auscultation. Complete previously ordered chest x-ray and send a swab for COVID, flu, and RSV. Continue over-the-counter medications, Tylenol  and Ibuprofen as needed, increase fluid intake, and rest. Further work up pending results. Return precautions given to patient.

## 2023-04-21 NOTE — Telephone Encounter (Signed)
 Chest xray done today awaiting results.

## 2023-04-23 LAB — COVID-19, FLU A+B AND RSV
Influenza A, NAA: DETECTED — AB
Influenza B, NAA: NOT DETECTED
RSV, NAA: NOT DETECTED
SARS-CoV-2, NAA: NOT DETECTED

## 2023-04-26 ENCOUNTER — Other Ambulatory Visit: Payer: Self-pay | Admitting: Nurse Practitioner

## 2023-04-26 DIAGNOSIS — J101 Influenza due to other identified influenza virus with other respiratory manifestations: Secondary | ICD-10-CM

## 2023-04-26 MED ORDER — ALBUTEROL SULFATE HFA 108 (90 BASE) MCG/ACT IN AERS: 2.0000 | INHALATION_SPRAY | Freq: Four times a day (QID) | RESPIRATORY_TRACT | 2 refills | Status: DC | PRN

## 2023-04-26 MED ORDER — BENZONATATE 200 MG PO CAPS: 200.0000 mg | ORAL_CAPSULE | Freq: Three times a day (TID) | ORAL | 0 refills | Status: DC | PRN

## 2023-05-01 ENCOUNTER — Ambulatory Visit (INDEPENDENT_AMBULATORY_CARE_PROVIDER_SITE_OTHER): Payer: Medicare Other | Admitting: *Deleted

## 2023-05-01 VITALS — Ht 65.0 in | Wt 175.0 lb

## 2023-05-01 DIAGNOSIS — Z0000 Encounter for general adult medical examination without abnormal findings: Secondary | ICD-10-CM | POA: Diagnosis not present

## 2023-05-01 DIAGNOSIS — Z1231 Encounter for screening mammogram for malignant neoplasm of breast: Secondary | ICD-10-CM | POA: Diagnosis not present

## 2023-05-01 DIAGNOSIS — Z780 Asymptomatic menopausal state: Secondary | ICD-10-CM | POA: Diagnosis not present

## 2023-05-01 NOTE — Progress Notes (Signed)
 Subjective:   Sherri Lee is a 68 y.o. female who presents for Medicare Annual (Subsequent) preventive examination.  Visit Complete: Virtual I connected with  Sherri Lee on 05/01/23 by a audio enabled telemedicine application and verified that I am speaking with the correct person using two identifiers. This patient declined Interactive audio and Acupuncturist. Therefore the visit was completed with audio only.   Patient Location: Home  Provider Location: Office/Clinic  I discussed the limitations of evaluation and management by telemedicine. The patient expressed understanding and agreed to proceed.  Vital Signs: Because this visit was a virtual/telehealth visit, some criteria may be missing or patient reported. Any vitals not documented were not able to be obtained and vitals that have been documented are patient reported.    Cardiac Risk Factors include: advanced age (>61men, >66 women);hypertension;diabetes mellitus     Objective:    Today's Vitals   05/01/23 0934  Weight: 175 lb (79.4 kg)  Height: 5\' 5"  (1.651 m)   Body mass index is 29.12 kg/m.     05/01/2023    9:51 AM 04/28/2022    3:25 PM 04/07/2021    2:07 PM 01/29/2021    3:34 PM 08/01/2020    3:20 AM 04/06/2020    2:15 PM 10/21/2018   11:18 AM  Advanced Directives  Does Patient Have a Medical Advance Directive? No No No No No No No  Would patient like information on creating a medical advance directive? No - Patient declined No - Patient declined No - Patient declined No - Patient declined No - Patient declined No - Patient declined No - Patient declined    Current Medications (verified) Outpatient Encounter Medications as of 05/01/2023  Medication Sig   acyclovir (ZOVIRAX) 400 MG tablet Take 1 tablet (400 mg total) by mouth 5 (five) times daily.   albuterol (VENTOLIN HFA) 108 (90 Base) MCG/ACT inhaler Inhale 2 puffs into the lungs every 6 (six) hours as needed for  wheezing or shortness of breath.   amLODipine (NORVASC) 5 MG tablet TAKE 1 TABLET (5 MG TOTAL) BY MOUTH DAILY.   atorvastatin (LIPITOR) 20 MG tablet TAKE 1 TABLET BY MOUTH EVERY DAY   benzonatate (TESSALON) 200 MG capsule Take 1 capsule (200 mg total) by mouth 3 (three) times daily as needed for cough.   calcium carbonate (OS-CAL) 600 MG TABS Take 600 mg by mouth 2 (two) times daily with a meal.   cyclobenzaprine (FLEXERIL) 5 MG tablet TAKE 1 TABLET BY MOUTH EVERYDAY AT BEDTIME   diclofenac Sodium (VOLTAREN) 1 % GEL Apply 2 g topically 4 (four) times daily.   escitalopram (LEXAPRO) 20 MG tablet Take 20 mg by mouth daily.    estradiol (ESTRACE) 0.1 MG/GM vaginal cream Place 1 Applicatorful vaginally 3 (three) times a week.   fluticasone (FLONASE) 50 MCG/ACT nasal spray Place 2 sprays into both nostrils daily as needed for allergies or rhinitis. After nasal saline   furosemide (LASIX) 20 MG tablet Take 1 tablet (20 mg total) by mouth as needed for edema.   gabapentin (NEURONTIN) 100 MG capsule TAKE 1 CAPSULE (100 MG TOTAL) BY MOUTH 3 TIMES A DAY   latanoprost (XALATAN) 0.005 % ophthalmic solution 1 drop at bedtime.   LORazepam (ATIVAN) 1 MG tablet TAKE 1 TABLET BY MOUTH TWICE A DAY AS DIRECTED FOR ANXIETY/PANIC   metoprolol succinate (TOPROL-XL) 50 MG 24 hr tablet TAKE 0.5 TABLETS (25 MG TOTAL) BY MOUTH DAILY. TAKE WITH OR IMMEDIATELY FOLLOWING A MEAL.  Multiple Vitamin (MULTIVITAMIN) tablet Take 1 tablet by mouth daily. Bariatric Vitamin   oxybutynin (DITROPAN-XL) 10 MG 24 hr tablet TAKE 1 TABLET BY MOUTH EVERYDAY AT BEDTIME   pantoprazole (PROTONIX) 40 MG tablet TAKE 1 TABLET BY MOUTH EVERY DAY   potassium chloride SA (KLOR-CON M) 20 MEQ tablet Take 1 tablet (20 mEq total) by mouth 2 (two) times daily.   traMADol (ULTRAM) 50 MG tablet Take 1 tablet (50 mg total) by mouth every 12 (twelve) hours as needed.   VITAMIN D PO Take by mouth.   zolpidem (AMBIEN) 10 MG tablet Take 10 mg by mouth at  bedtime as needed for sleep.   No facility-administered encounter medications on file as of 05/01/2023.    Allergies (verified) Bacitracin-polymyxin b, Egg-derived products, and Povidone iodine   History: Past Medical History:  Diagnosis Date   Anemia    Low iron and hemoglobin   Anginal pain (HCC)    states she thinks it is stress   Anxiety    Asthma    COVID-19    07/05/20, 03/2021   Depression    Dysrhythmia    states she has PVC's   Fibromyalgia    Hypertension    Recurrent vomiting 02/04/2018   Schatzki's ring    dialated 3 times by Markham Jordan    Past Surgical History:  Procedure Laterality Date   ABDOMINAL ADHESION SURGERY     BALLOON DILATION  11/15/2022   Procedure: BALLOON DILATION;  Surgeon: Midge Minium, MD;  Location: [facility] ENDOSCOPY;  Service: Endoscopy;;   BUNIONECTOMY     left foot    CERVICAL FUSION     CESAREAN SECTION     CHOLECYSTECTOMY  June 2013   COLONOSCOPY WITH PROPOFOL N/A 11/15/2022   Procedure: COLONOSCOPY WITH PROPOFOL;  Surgeon: Midge Minium, MD;  Location: [facility] ENDOSCOPY;  Service: Endoscopy;  Laterality: N/A;   ESOPHAGOGASTRODUODENOSCOPY (EGD) WITH PROPOFOL N/A 11/15/2022   Procedure: ESOPHAGOGASTRODUODENOSCOPY (EGD) WITH PROPOFOL;  Surgeon: Midge Minium, MD;  Location: [facility] ENDOSCOPY;  Service: Endoscopy;  Laterality: N/A;   GASTRIC BYPASS     Family History  Problem Relation Age of Onset   Hypertension Mother    Glaucoma Mother    Heart disease Father    Stroke Father    Breast cancer Maternal Aunt    Cancer Maternal Aunt    Colon cancer Neg Hx    Ovarian cancer Neg Hx    Social History   Socioeconomic History   Marital status: Divorced    Spouse name: Not on file   Number of children: Not on file   Years of education: Not on file   Highest education level: Not on file  Occupational History   Not on file  Tobacco Use   Smoking status: Never   Smokeless tobacco: Never  Vaping Use   Vaping status: Never Used  Substance and Sexual  Activity   Alcohol use: Yes    Alcohol/week: 3.0 standard drinks of alcohol    Types: 3 Glasses of wine per week   Drug use: No   Sexual activity: Yes  Other Topics Concern   Not on file  Social History Narrative   Lives with spouse, son, and stepson who has serious psychiatric issues and alcohol abuse   Social Drivers of Corporate investment banker Strain: Low Risk  (05/01/2023)   Overall Financial Resource Strain (CARDIA)    Difficulty of Paying Living Expenses: Not hard at all  Food Insecurity: No Food Insecurity (05/01/2023)  Hunger Vital Sign    Worried About Running Out of Food in the Last Year: Never true    Ran Out of Food in the Last Year: Never true  Transportation Needs: No Transportation Needs (05/01/2023)   PRAPARE - Administrator, Civil Service (Medical): No    Lack of Transportation (Non-Medical): No  Physical Activity: Inactive (05/01/2023)   Exercise Vital Sign    Days of Exercise per Week: 0 days    Minutes of Exercise per Session: 0 min  Stress: Stress Concern Present (05/01/2023)   [employer] of Occupational Health - Occupational Stress Questionnaire    Feeling of Stress : To some extent  Social Connections: Moderately Isolated (05/01/2023)   Social Connection and Isolation Panel [NHANES]    Frequency of Communication with Friends and Family: More than three times a week    Frequency of Social Gatherings with Friends and Family: More than three times a week    Attends Religious Services: More than 4 times per year    Active Member of [employer] or Organizations: No    Attends Engineer, structural: Never    Marital Status: Divorced    Tobacco Counseling Counseling given: Not Answered   Clinical Intake:  Pre-visit preparation completed: Yes  Pain : No/denies pain     BMI - recorded: 29.12 Nutritional Status: BMI 25 -29 Overweight Nutritional Risks: None Diabetes: Yes CBG done?: No Did pt. bring in CBG monitor from home?:  No  How often do you need to have someone help you when you read instructions, pamphlets, or other written materials from your doctor or pharmacy?: 1 - Never  Interpreter Needed?: No  Information entered by :: R. Anginette Espejo LPN   Activities of Daily Living    05/01/2023    9:36 AM  In your present state of health, do you have any difficulty performing the following activities:  Hearing? 0  Vision? 0  Difficulty concentrating or making decisions? 0  Walking or climbing stairs? 0  Dressing or bathing? 0  Doing errands, shopping? 0  Preparing Food and eating ? N  Using the Toilet? N  In the past six months, have you accidently leaked urine? Y  Do you have problems with loss of bowel control? N  Managing your Medications? N  Managing your Finances? N  Housekeeping or managing your Housekeeping? N    Patient Care Team: [name], MD as PCP - General (Internal Medicine)  Indicate any recent Medical Services you may have received from other than [facility] providers in the past year (date may be approximate).     Assessment:   This is a routine wellness examination for [name].  Hearing/Vision screen Hearing Screening - Comments:: No issues Vision Screening - Comments:: No glasses   Goals Addressed             This Visit's Progress    Patient Stated       Wants to exercise more       Depression Screen    05/01/2023    9:44 AM 04/19/2023    1:13 PM 04/11/2023    1:55 PM 02/01/2023    5:14 PM 10/03/2022    4:11 PM 07/04/2022    4:14 PM 05/13/2022    4:17 PM  PHQ 2/9 Scores  PHQ - 2 Score 1 6 4 2  0 2 2  PHQ- 9 Score 8 24 14 7 4 6 8     Fall Risk    05/01/2023  9:38 AM 04/19/2023    1:13 PM 04/11/2023    1:55 PM 02/01/2023    5:14 PM 10/03/2022    4:11 PM  Fall Risk   Falls in the past year? 0 0 0 0 0  Number falls in past yr: 0 0 0 0 0  Injury with Fall? 0 0 0 0 0  Risk for fall due to : No Fall Risks No Fall Risks No Fall Risks No Fall Risks No Fall Risks   Follow up Falls prevention discussed;Falls evaluation completed Falls evaluation completed Falls evaluation completed Falls evaluation completed Falls evaluation completed    MEDICARE RISK AT HOME: Medicare Risk at Home Any stairs in or around the home?: Yes If so, are there any without handrails?: Yes Home free of loose throw rugs in walkways, pet beds, electrical cords, etc?: Yes Adequate lighting in your home to reduce risk of falls?: Yes Life alert?: No Use of a cane, walker or w/c?: No Grab bars in the bathroom?: No Shower chair or bench in shower?: Yes Elevated toilet seat or a handicapped toilet?: No   Cognitive Function:        05/01/2023    9:52 AM 04/28/2022    3:29 PM 04/07/2021    2:20 PM 04/06/2020    2:33 PM  6CIT Screen  What Year? 0 points 0 points 0 points 0 points  What month? 0 points 0 points 0 points 0 points  What time? 0 points 0 points 0 points 0 points  Count back from 20 0 points 0 points 0 points 0 points  Months in reverse 0 points 0 points 0 points 0 points  Repeat phrase 0 points 0 points 0 points   Total Score 0 points 0 points 0 points     Immunizations Immunization History  Administered Date(s) Administered   Fluad Quad(high Dose 65+) 02/13/2020, 12/27/2021   Hepatitis B 07/12/2012   Influenza,inj,Quad PF,6+ Mos 01/20/2014, 02/02/2018, 01/30/2019   Influenza-Unspecified 01/13/2012, 02/08/2021   MMR 07/12/2012   Moderna Covid-19 Vaccine Bivalent Booster 50yrs & up 01/06/2021   PFIZER(Purple Top)SARS-COV-2 Vaccination 06/02/2019, 06/23/2019, 01/29/2020   PNEUMOCOCCAL CONJUGATE-20 10/07/2020   Tdap 01/13/2012, 07/12/2012    TDAP status: Due, Education has been provided regarding the importance of this vaccine. Advised may receive this vaccine at local pharmacy or Health Dept. Aware to provide a copy of the vaccination record if obtained from local pharmacy or Health Dept. Verbalized acceptance and understanding.  Flu Vaccine status: Due,  Education has been provided regarding the importance of this vaccine. Advised may receive this vaccine at local pharmacy or Health Dept. Aware to provide a copy of the vaccination record if obtained from local pharmacy or Health Dept. Verbalized acceptance and understanding.  Pneumococcal vaccine status: Up to date  Covid-19 vaccine status: Information provided on how to obtain vaccines.   Qualifies for Shingles Vaccine? Yes   Zostavax completed No   Shingrix Completed?: No.    Education has been provided regarding the importance of this vaccine. Patient has been advised to call insurance company to determine out of pocket expense if they have not yet received this vaccine. Advised may also receive vaccine at local pharmacy or Health Dept. Verbalized acceptance and understanding.  Screening Tests Health Maintenance  Topic Date Due   Zoster Vaccines- Shingrix (1 of 2) Never done   MAMMOGRAM  02/25/2017   DEXA SCAN  Never done   DTaP/Tdap/Td (3 - Td or Tdap) 07/13/2022   OPHTHALMOLOGY EXAM  09/12/2022  COVID-19 Vaccine (5 - 2024-25 season) 11/13/2022   HEMOGLOBIN A1C  12/29/2022   Medicare Annual Wellness (AWV)  04/29/2023   INFLUENZA VACCINE  06/12/2023 (Originally 10/13/2022)   Diabetic kidney evaluation - Urine ACR  06/29/2023   Diabetic kidney evaluation - eGFR measurement  04/10/2024   Colonoscopy  11/14/2032   Pneumonia Vaccine 82+ Years old  Completed   Hepatitis C Screening  Completed   HPV VACCINES  Aged Out    Health Maintenance  Health Maintenance Due  Topic Date Due   Zoster Vaccines- Shingrix (1 of 2) Never done   MAMMOGRAM  02/25/2017   DEXA SCAN  Never done   DTaP/Tdap/Td (3 - Td or Tdap) 07/13/2022   OPHTHALMOLOGY EXAM  09/12/2022   COVID-19 Vaccine (5 - 2024-25 season) 11/13/2022   HEMOGLOBIN A1C  12/29/2022   Medicare Annual Wellness (AWV)  04/29/2023    Colorectal cancer screening: Type of screening: Colonoscopy. Completed 11/2022. Repeat every 10  years  Mammogram status: Ordered 06/13/22. Pt provided with contact info and advised to call to schedule appt. Patient will call and schedule an appointment but wants changed to [facility]. New ordered placed.  Bone Density status: Ordered 05/01/23. Pt provided with contact info and advised to call to schedule appt.  Lung Cancer Screening: (Low Dose CT Chest recommended if Age 54-80 years, 20 pack-year currently smoking OR have quit w/in 15years.) does not qualify.     Additional Screening:  Hepatitis C Screening: does qualify; Completed 02/2021  Vision Screening: Recommended annual ophthalmology exams for early detection of glaucoma and other disorders of the eye. Is the patient up to date with their annual eye exam?  Yes  Who is the provider or what is the name of the office in which the patient attends annual eye exams? [facility] /Dr. Georgianne Fick Requested last diabetic eye exam If pt is not established with a provider, would they like to be referred to a provider to establish care? No .   Dental Screening: Recommended annual dental exams for proper oral hygiene  Diabetic Foot Exam: Diabetic Foot Exam: Overdue, Pt has been advised about the importance in completing this exam. Pt is scheduled for diabetic foot exam on No record of foot exam..  Community Resource Referral / Chronic Care Management: CRR required this visit?  No   CCM required this visit?  No     Plan:     I have personally reviewed and noted the following in the patient's chart:   Medical and social history Use of alcohol, tobacco or illicit drugs  Current medications and supplements including opioid prescriptions. Patient is currently taking opioid prescriptions. Information provided to patient regarding non-opioid alternatives. Patient advised to discuss non-opioid treatment plan with their provider. Functional ability and status Nutritional status Physical activity Advanced directives List of other  physicians Hospitalizations, surgeries, and ER visits in previous 12 months Vitals Screenings to include cognitive, depression, and falls Referrals and appointments  In addition, I have reviewed and discussed with patient certain preventive protocols, quality metrics, and best practice recommendations. A written personalized care plan for preventive services as well as general preventive health recommendations were provided to patient.     Sydell Axon, LPN   11/10/5619   After Visit Summary: (MyChart) Due to this being a telephonic visit, the after visit summary with patients personalized plan was offered to patient via MyChart   Nurse Notes: None

## 2023-05-01 NOTE — Patient Instructions (Addendum)
 Ms. Riesen , Thank you for taking time to come for your Medicare Wellness Visit. I appreciate your ongoing commitment to your health goals. Please review the following plan we discussed and let me know if I can assist you in the future.   Referrals/Orders/Follow-Ups/Clinician Recommendations: Mammogram and Bone Density ordered. Consider updating your vaccines. You have an order for:  []   2D Mammogram  [x]   3D Mammogram  [x]   Bone Density     Please call for appointment:  [facility]  [address] [place] [address] [phone]     Make sure to wear two-piece clothing.  No lotions, powders, or deodorants the day of the appointment. Make sure to bring picture ID and insurance card.  Bring list of medications you are currently taking including any supplements.   Schedule your [place] screening mammogram through MyChart!   Log into your MyChart account.  Go to 'Visit' (or 'Appointments' if on mobile App) --> Schedule an Appointment  Under 'Select a Reason for Visit' choose the Mammogram Screening option.  Complete the pre-visit questions and select the time and place that best fits your schedule.    This is a list of the screening recommended for you and due dates:  Health Maintenance  Topic Date Due   Zoster (Shingles) Vaccine (1 of 2) Never done   Mammogram  02/25/2017   DEXA scan (bone density measurement)  Never done   Eye exam for diabetics  03/14/2022   DTaP/Tdap/Td vaccine (3 - Td or Tdap) 07/13/2022   COVID-19 Vaccine (5 - 2024-25 season) 11/13/2022   Hemoglobin A1C  12/29/2022   Flu Shot  06/12/2023*   Yearly kidney health urinalysis for diabetes  06/29/2023   Yearly kidney function blood test for diabetes  04/10/2024   Medicare Annual Wellness Visit  04/30/2024   Colon Cancer Screening  11/14/2032   Pneumonia Vaccine  Completed   Hepatitis C Screening  Completed   HPV Vaccine  Aged Out  *Topic was  postponed. The date shown is not the original due date.    Advanced directives: (Declined) Advance directive discussed with you today. Even though you declined this today, please call our office should you change your mind, and we can give you the proper paperwork for you to fill out.  Next Medicare Annual Wellness Visit scheduled for next year: Yes 05/01/24 @ 2:20   Managing Pain Without Opioids Opioids are strong medicines used to treat moderate to severe pain. For some people, especially those who have long-term (chronic) pain, opioids may not be the best choice for pain management due to: Side effects like nausea, constipation, and sleepiness. The risk of addiction (opioid use disorder). The longer you take opioids, the greater your risk of addiction. Pain that lasts for more than 3 months is called chronic pain. Managing chronic pain usually requires more than one approach and is often provided by a team of health care providers working together (multidisciplinary approach). Pain management may be done at a pain management center or pain clinic. How to manage pain without the use of opioids Use non-opioid medicines Non-opioid medicines for pain may include: Over-the-counter or prescription non-steroidal anti-inflammatory drugs (NSAIDs). These may be the first medicines used for pain. They work well for muscle and bone pain, and they reduce swelling. Acetaminophen. This over-the-counter medicine may work well for milder pain but not swelling. Antidepressants. These may be used to treat chronic pain. A certain type of antidepressant (  tricyclics) is often used. These medicines are given in lower doses for pain than when used for depression. Anticonvulsants. These are usually used to treat seizures but may also reduce nerve (neuropathic) pain. Muscle relaxants. These relieve pain caused by sudden muscle tightening (spasms). You may also use a pain medicine that is applied to the skin as a patch,  cream, or gel (topical analgesic), such as a numbing medicine. These may cause fewer side effects than medicines taken by mouth. Do certain therapies as directed Some therapies can help with pain management. They include: Physical therapy. You will do exercises to gain strength and flexibility. A physical therapist may teach you exercises to move and stretch parts of your body that are weak, stiff, or painful. You can learn these exercises at physical therapy visits and practice them at home. Physical therapy may also involve: Massage. Heat wraps or applying heat or cold to affected areas. Electrical signals that interrupt pain signals (transcutaneous electrical nerve stimulation, TENS). Weak lasers that reduce pain and swelling (low-level laser therapy). Signals from your body that help you learn to regulate pain (biofeedback). Occupational therapy. This helps you to learn ways to function at home and work with less pain. Recreational therapy. This involves trying new activities or hobbies, such as a physical activity or drawing. Mental health therapy, including: Cognitive behavioral therapy (CBT). This helps you learn coping skills for dealing with pain. Acceptance and commitment therapy (ACT) to change the way you think and react to pain. Relaxation therapies, including muscle relaxation exercises and mindfulness-based stress reduction. Pain management counseling. This may be individual, family, or group counseling.  Receive medical treatments Medical treatments for pain management include: Nerve block injections. These may include a pain blocker and anti-inflammatory medicines. You may have injections: Near the spine to relieve chronic back or neck pain. Into joints to relieve back or joint pain. Into nerve areas that supply a painful area to relieve body pain. Into muscles (trigger point injections) to relieve some painful muscle conditions. A medical device placed near your spine to  help block pain signals and relieve nerve pain or chronic back pain (spinal cord stimulation device). Acupuncture. Follow these instructions at home Medicines Take over-the-counter and prescription medicines only as told by your health care provider. If you are taking pain medicine, ask your health care providers about possible side effects to watch out for. Do not drive or use heavy machinery while taking prescription opioid pain medicine. Lifestyle  Do not use drugs or alcohol to reduce pain. If you drink alcohol, limit how much you have to: 0-1 drink a day for women who are not pregnant. 0-2 drinks a day for men. Know how much alcohol is in a drink. In the [place] one drink equals one 12 oz bottle of beer (355 mL), one 5 oz glass of wine (148 mL), or one 1 oz glass of hard liquor (44 mL). Do not use any products that contain nicotine or tobacco. These products include cigarettes, chewing tobacco, and vaping devices, such as e-cigarettes. If you need help quitting, ask your health care provider. Eat a healthy diet and maintain a healthy weight. Poor diet and excess weight may make pain worse. Eat foods that are high in fiber. These include fresh fruits and vegetables, whole grains, and beans. Limit foods that are high in fat and processed sugars, such as fried and sweet foods. Exercise regularly. Exercise lowers stress and may help relieve pain. Ask your health care provider what activities and  exercises are safe for you. If your health care provider approves, join an exercise class that combines movement and stress reduction. Examples include yoga and tai chi. Get enough sleep. Lack of sleep may make pain worse. Lower stress as much as possible. Practice stress reduction techniques as told by your therapist. General instructions Work with all your pain management providers to find the treatments that work best for you. You are an important member of your pain management team. There are many  things you can do to reduce pain on your own. Consider joining an online or in-person support group for people who have chronic pain. Keep all follow-up visits. This is important. Where to find more information You can find more information about managing pain without opioids from: American Academy of Pain Medicine: painmed.org Institute for Chronic Pain: instituteforchronicpain.org American Chronic Pain Association: theacpa.org Contact a health care provider if: You have side effects from pain medicine. Your pain gets worse or does not get better with treatments or home therapy. You are struggling with anxiety or depression. Summary Many types of pain can be managed without opioids. Chronic pain may respond better to pain management without opioids. Pain is best managed when you and a team of health care providers work together. Pain management without opioids may include non-opioid medicines, medical treatments, physical therapy, mental health therapy, and lifestyle changes. Tell your health care providers if your pain gets worse or is not being managed well enough. This information is not intended to replace advice given to you by your health care provider. Make sure you discuss any questions you have with your health care provider. Document Revised: 06/10/2020 Document Reviewed: 06/10/2020 Elsevier Patient Education  2024 [employer].

## 2023-05-05 ENCOUNTER — Other Ambulatory Visit: Payer: Self-pay | Admitting: Internal Medicine

## 2023-05-05 DIAGNOSIS — M47812 Spondylosis without myelopathy or radiculopathy, cervical region: Secondary | ICD-10-CM

## 2023-05-05 DIAGNOSIS — M62838 Other muscle spasm: Secondary | ICD-10-CM

## 2023-05-09 ENCOUNTER — Encounter: Payer: Self-pay | Admitting: Internal Medicine

## 2023-05-09 ENCOUNTER — Ambulatory Visit (INDEPENDENT_AMBULATORY_CARE_PROVIDER_SITE_OTHER): Payer: Medicare Other | Admitting: Internal Medicine

## 2023-05-09 VITALS — BP 108/74 | HR 85 | Ht 65.0 in | Wt 178.6 lb

## 2023-05-09 DIAGNOSIS — R5383 Other fatigue: Secondary | ICD-10-CM

## 2023-05-09 DIAGNOSIS — R042 Hemoptysis: Secondary | ICD-10-CM | POA: Insufficient documentation

## 2023-05-09 DIAGNOSIS — J101 Influenza due to other identified influenza virus with other respiratory manifestations: Secondary | ICD-10-CM

## 2023-05-09 DIAGNOSIS — E876 Hypokalemia: Secondary | ICD-10-CM | POA: Diagnosis not present

## 2023-05-09 DIAGNOSIS — K521 Toxic gastroenteritis and colitis: Secondary | ICD-10-CM | POA: Diagnosis not present

## 2023-05-09 DIAGNOSIS — E2839 Other primary ovarian failure: Secondary | ICD-10-CM

## 2023-05-09 DIAGNOSIS — T502X5A Adverse effect of carbonic-anhydrase inhibitors, benzothiadiazides and other diuretics, initial encounter: Secondary | ICD-10-CM

## 2023-05-09 DIAGNOSIS — Z9884 Bariatric surgery status: Secondary | ICD-10-CM

## 2023-05-09 DIAGNOSIS — T3695XA Adverse effect of unspecified systemic antibiotic, initial encounter: Secondary | ICD-10-CM

## 2023-05-09 DIAGNOSIS — D500 Iron deficiency anemia secondary to blood loss (chronic): Secondary | ICD-10-CM | POA: Diagnosis not present

## 2023-05-09 DIAGNOSIS — Z1231 Encounter for screening mammogram for malignant neoplasm of breast: Secondary | ICD-10-CM

## 2023-05-09 MED ORDER — BENZONATATE 200 MG PO CAPS: 200.0000 mg | ORAL_CAPSULE | Freq: Three times a day (TID) | ORAL | 0 refills | Status: DC | PRN

## 2023-05-09 MED ORDER — ATORVASTATIN CALCIUM 20 MG PO TABS: 20.0000 mg | ORAL_TABLET | Freq: Every day | ORAL | 3 refills | Status: AC

## 2023-05-09 NOTE — Assessment & Plan Note (Signed)
 Current esisodes of post prandial vomiting are not occurring daily and likely related to food choices and overeating ; however she may require gastroparesis study and   EGD  i f symptoms do not resolve  with more prudent diet .  Continue PPI ,  RTC one month

## 2023-05-09 NOTE — Patient Instructions (Addendum)
 YOU WERE NOT TESTED FOR C DIFICILE SO I AM ORDERING IT TODAY  I HAVE ORDERED A CHEST CT TO INVESTIGATE THE BLOODY SPUTUM YOU COUGH UP  THE VOMITING MAY BE DUE TO YOUR CHOICE OF FOODS. . TRY A BLAND DIET, CONTINUE TAKING PROTONIX AND FOLLOW UP ONE MONTH

## 2023-05-09 NOTE — Assessment & Plan Note (Signed)
 New onset  after 6 to 8 weeks of cough, with negative chest x ray.   CT-scan of the chest ordered

## 2023-05-09 NOTE — Progress Notes (Signed)
 Subjective:  Patient ID: Sherri Lee, female    DOB: 11-Mar-1956  Age: 68 y.o. MRN: [id number]  CC: The primary encounter diagnosis was Antibiotic-associated diarrhea. Diagnoses of Iron deficiency anemia due to chronic blood loss, Other fatigue, Cough with hemoptysis, Breast cancer screening by mammogram, Estrogen deficiency, Influenza A, and Status post gastric bypass for obesity were also pertinent to this visit.   HPI [name] presents for  Chief Complaint  Patient presents with   Medical Management of Chronic Issues    3)  COPD: TREATED 6  TIMES  SINCE NOVEMBER  FOR  URI,   TWICE FOR ACUTE SINUSITIS,  THEN TESTED POSITIVE FOR  INFLUENZA A ON FEB 12  WITH H/O RIGORS TW DAYS EARLY NOT TREATED WITH  TAMIFLU  .  STILL  PRODUCING SPUTUM, USING TESSALON AND OTC ROBITUSSIN.  BUT RAN OUT YESTERDAY.  YESTERDAY SHE  COUGHED UP BLOOD YESTERDAY , SMALL AMOUNT OF BLOOD IN SPUTUM  .  HAS HAD RHINORRHEA WITHOUT BLOOD IN  DRAINAGE    4)    HAVING EPISODES OF POST PRANDIAL VOMITING . ALMOST IMMEDIATELY AFTER EATING.  NOT DAILY ,  LAST NIGHT WAS THE LAST OCCURRENCE  AFTER EATING A CHEESEBURGER     Outpatient Medications Prior to Visit  Medication Sig Dispense Refill   acyclovir (ZOVIRAX) 400 MG tablet Take 1 tablet (400 mg total) by mouth 5 (five) times daily. 35 tablet 3   albuterol (VENTOLIN HFA) 108 (90 Base) MCG/ACT inhaler Inhale 2 puffs into the lungs every 6 (six) hours as needed for wheezing or shortness of breath. 8 g 2   amLODipine (NORVASC) 5 MG tablet TAKE 1 TABLET (5 MG TOTAL) BY MOUTH DAILY. 90 tablet 1   calcium carbonate (OS-CAL) 600 MG TABS Take 600 mg by mouth 2 (two) times daily with a meal.     cyclobenzaprine (FLEXERIL) 5 MG tablet TAKE 1 TABLET BY MOUTH EVERYDAY AT BEDTIME 90 tablet 1   diclofenac Sodium (VOLTAREN) 1 % GEL Apply 2 g topically 4 (four) times daily. 100 g G   escitalopram (LEXAPRO) 20 MG tablet Take 20 mg by mouth daily.       estradiol (ESTRACE) 0.1 MG/GM vaginal cream Place 1 Applicatorful vaginally 3 (three) times a week. 42.5 g 11   fluticasone (FLONASE) 50 MCG/ACT nasal spray Place 2 sprays into both nostrils daily as needed for allergies or rhinitis. After nasal saline 48 mL 5   furosemide (LASIX) 20 MG tablet Take 1 tablet (20 mg total) by mouth as needed for edema. 20 tablet 1   gabapentin (NEURONTIN) 100 MG capsule TAKE 1 CAPSULE (100 MG TOTAL) BY MOUTH 3 TIMES A DAY 270 capsule 3   latanoprost (XALATAN) 0.005 % ophthalmic solution 1 drop at bedtime.     LORazepam (ATIVAN) 1 MG tablet TAKE 1 TABLET BY MOUTH TWICE A DAY AS DIRECTED FOR ANXIETY/PANIC  2   metoprolol succinate (TOPROL-XL) 50 MG 24 hr tablet TAKE 0.5 TABLETS (25 MG TOTAL) BY MOUTH DAILY. TAKE WITH OR IMMEDIATELY FOLLOWING A MEAL. 45 tablet 3   Multiple Vitamin (MULTIVITAMIN) tablet Take 1 tablet by mouth daily. Bariatric Vitamin     oxybutynin (DITROPAN-XL) 10 MG 24 hr tablet TAKE 1 TABLET BY MOUTH EVERYDAY AT BEDTIME 90 tablet 1   pantoprazole (PROTONIX) 40 MG tablet TAKE 1 TABLET BY MOUTH EVERY DAY 90 tablet 3   potassium chloride SA (KLOR-CON M) 20 MEQ tablet Take 1 tablet (20 mEq total) by mouth 2 (  two) times daily. 10 tablet 0   traMADol (ULTRAM) 50 MG tablet Take 1 tablet (50 mg total) by mouth every 12 (twelve) hours as needed. 60 tablet 4   VITAMIN D PO Take by mouth.     zolpidem (AMBIEN) 10 MG tablet Take 10 mg by mouth at bedtime as needed for sleep.     atorvastatin (LIPITOR) 20 MG tablet TAKE 1 TABLET BY MOUTH EVERY DAY 90 tablet 3   benzonatate (TESSALON) 200 MG capsule Take 1 capsule (200 mg total) by mouth 3 (three) times daily as needed for cough. (Patient not taking: Reported on 05/09/2023) 30 capsule 0   No facility-administered medications prior to visit.    Review of Systems;  Patient denies headache, fevers, malaise, unintentional weight loss, skin rash, eye pain, sinus congestion and sinus pain, sore throat, dysphagia,   hemoptysis , cough, dyspnea, wheezing, chest pain, palpitations, orthopnea, edema, abdominal pain, nausea, melena, diarrhea, constipation, flank pain, dysuria, hematuria, urinary  Frequency, nocturia, numbness, tingling, seizures,  Focal weakness, Loss of consciousness,  Tremor, insomnia, depression, anxiety, and suicidal ideation.      Objective:  BP 108/74   Pulse 85   Ht 5\' 5"  (1.651 m)   Wt 178 lb 9.6 oz (81 kg)   SpO2 98%   BMI 29.72 kg/m   BP Readings from Last 3 Encounters:  05/09/23 108/74  04/21/23 120/80  04/19/23 122/82    Wt Readings from Last 3 Encounters:  05/09/23 178 lb 9.6 oz (81 kg)  05/01/23 175 lb (79.4 kg)  04/21/23 182 lb (82.6 kg)    Physical Exam Vitals reviewed.  Constitutional:      General: She is not in acute distress.    Appearance: Normal appearance. She is normal weight. She is not ill-appearing, toxic-appearing or diaphoretic.  HENT:     Head: Normocephalic.  Eyes:     General: No scleral icterus.       Right eye: No discharge.        Left eye: No discharge.     Conjunctiva/sclera: Conjunctivae normal.  Cardiovascular:     Rate and Rhythm: Normal rate and regular rhythm.     Heart sounds: Normal heart sounds.  Pulmonary:     Effort: Pulmonary effort is normal. No respiratory distress.     Breath sounds: Normal breath sounds.  Musculoskeletal:        General: Normal range of motion.  Skin:    General: Skin is warm and dry.  Neurological:     General: No focal deficit present.     Mental Status: She is alert and oriented to person, place, and time. Mental status is at baseline.  Psychiatric:        Mood and Affect: Mood normal.        Behavior: Behavior normal.        Thought Content: Thought content normal.        Judgment: Judgment normal.    Lab Results  Component Value Date   HGBA1C 6.4 06/29/2022   HGBA1C 5.9 (A) 12/31/2021   HGBA1C 6.4 09/01/2020    Lab Results  Component Value Date   CREATININE 0.71 04/11/2023    CREATININE 0.77 06/29/2022   CREATININE 0.72 07/30/2021    Lab Results  Component Value Date   WBC 5.8 03/03/2023   HGB 10.9 (L) 03/03/2023   HCT 34.4 03/03/2023   PLT 230 03/03/2023   GLUCOSE 78 04/11/2023   CHOL 158 06/29/2022   TRIG 54 06/29/2022  HDL 86 06/29/2022   LDLDIRECT 99.0 09/04/2015   LDLCALC 59 06/29/2022   ALT 28 04/11/2023   AST 34 04/11/2023   NA 142 04/11/2023   K 3.5 04/11/2023   CL 106 04/11/2023   CREATININE 0.71 04/11/2023   BUN 7 04/11/2023   CO2 29 04/11/2023   TSH 0.65 06/29/2022   HGBA1C 6.4 06/29/2022   MICROALBUR <0.7 06/29/2022    DG Chest 2 View Result Date: 04/21/2023 CLINICAL DATA:  Cough, shortness of breath. EXAM: CHEST - 2 VIEW COMPARISON:  March 02, 2022. FINDINGS: The heart size and mediastinal contours are within normal limits. Both lungs are clear. The visualized skeletal structures are unremarkable. IMPRESSION: No active cardiopulmonary disease. Electronically Signed   By: Lupita Raider M.D.   On: 04/21/2023 16:29    Assessment & Plan:  .Antibiotic-associated diarrhea Assessment & Plan: C dificile colitis was not ruled out  during prior evaluation despite multiple rounds of antibiotics. Stools ave not returned to normal . C dif ordered   Orders: -     C Difficile Quick Screen w PCR reflex; Future  Iron deficiency anemia due to chronic blood loss -     CBC with Differential/Platelet -     IBC + Ferritin  Other fatigue -     Comprehensive metabolic panel -     TSH  Cough with hemoptysis Assessment & Plan: New onset  after 6 to 8 weeks of cough, with negative chest x ray.   CT-scan of the chest ordered   Orders: -     CT CHEST W CONTRAST; Future  Breast cancer screening by mammogram  Estrogen deficiency  Influenza A -     Benzonatate; Take 1 capsule (200 mg total) by mouth 3 (three) times daily as needed for cough.  Dispense: 30 capsule; Refill: 0  Status post gastric bypass for obesity Assessment &  Plan: Current esisodes of post prandial vomiting are not occurring daily and likely related to food choices and overeating ; however she may require gastroparesis study and   EGD  i f symptoms do not resolve  with more prudent diet .  Continue PPI ,  RTC one month    Other orders -     Atorvastatin Calcium; Take 1 tablet (20 mg total) by mouth daily.  Dispense: 90 tablet; Refill: 3     I spent 34 minutes on the day of this face to face encounter reviewing patient's  most recent visit with cardiology,  nephrology,  and neurology,  prior relevant surgical and non surgical procedures, recent  labs and imaging studies, counseling on weight management,  reviewing the assessment and plan with patient, and post visit ordering and reviewing of  diagnostics and therapeutics with patient  .   Follow-up: Return in about 4 weeks (around 06/06/2023).   [name], MD

## 2023-05-09 NOTE — Assessment & Plan Note (Signed)
 C dificile colitis was not ruled out  during prior evaluation despite multiple rounds of antibiotics. Stools ave not returned to normal . C dif ordered

## 2023-05-10 ENCOUNTER — Encounter: Payer: Self-pay | Admitting: Internal Medicine

## 2023-05-10 LAB — COMPREHENSIVE METABOLIC PANEL
ALT: 27 U/L (ref 0–35)
AST: 32 U/L (ref 0–37)
Albumin: 3.5 g/dL (ref 3.5–5.2)
Alkaline Phosphatase: 84 U/L (ref 39–117)
BUN: 9 mg/dL (ref 6–23)
CO2: 25 meq/L (ref 19–32)
Calcium: 8.7 mg/dL (ref 8.4–10.5)
Chloride: 106 meq/L (ref 96–112)
Creatinine, Ser: 0.63 mg/dL (ref 0.40–1.20)
GFR: 91.18 mL/min (ref >60.00)
Glucose, Bld: 98 mg/dL (ref 70–99)
Potassium: 3.2 meq/L — ABNORMAL LOW (ref 3.5–5.1)
Sodium: 140 meq/L (ref 135–145)
Total Bilirubin: 0.5 mg/dL (ref 0.2–1.2)
Total Protein: 6.5 g/dL (ref 6.0–8.3)

## 2023-05-10 LAB — TSH: TSH: 0.86 u[IU]/mL (ref 0.35–5.50)

## 2023-05-10 LAB — IBC + FERRITIN
Ferritin: 23.5 ng/mL (ref 10.0–291.0)
Iron: 86 ug/dL (ref 42–145)
Saturation Ratios: 25.8 % (ref 20.0–50.0)
TIBC: 333.2 ug/dL (ref 250.0–450.0)
Transferrin: 238 mg/dL (ref 212.0–360.0)

## 2023-05-10 LAB — CBC WITH DIFFERENTIAL/PLATELET
Basophils Absolute: 0 10*3/uL (ref 0.0–0.1)
Basophils Relative: 0.8 % (ref 0.0–3.0)
Eosinophils Absolute: 0.5 10*3/uL (ref 0.0–0.7)
Eosinophils Relative: 8.6 % — ABNORMAL HIGH (ref 0.0–5.0)
HCT: 36 % (ref 36.0–46.0)
Hemoglobin: 11.9 g/dL — ABNORMAL LOW (ref 12.0–15.0)
Lymphocytes Relative: 27.4 % (ref 12.0–46.0)
Lymphs Abs: 1.5 10*3/uL (ref 0.7–4.0)
MCHC: 33.1 g/dL (ref 30.0–36.0)
MCV: 87.6 fl (ref 78.0–100.0)
Monocytes Absolute: 0.5 10*3/uL (ref 0.1–1.0)
Monocytes Relative: 8.7 % (ref 3.0–12.0)
Neutro Abs: 3 10*3/uL (ref 1.4–7.7)
Neutrophils Relative %: 54.5 % (ref 43.0–77.0)
Platelets: 234 10*3/uL (ref 150.0–400.0)
RBC: 4.11 Mil/uL (ref 3.87–5.11)
RDW: 13.2 % (ref 11.5–15.5)
WBC: 5.6 10*3/uL (ref 4.0–10.5)

## 2023-05-10 MED ORDER — POTASSIUM CHLORIDE CRYS ER 20 MEQ PO TBCR
20.0000 meq | EXTENDED_RELEASE_TABLET | Freq: Two times a day (BID) | ORAL | 0 refills | Status: DC
Start: 2023-05-10 — End: 2023-06-06

## 2023-05-10 NOTE — Addendum Note (Signed)
 Addended by: [name] on: 05/10/2023 01:54 PM   Modules accepted: Orders

## 2023-05-10 NOTE — Assessment & Plan Note (Signed)
 Likey due ot use of furosemide,  aggravated by vomiting and diarrhea.  Pprescribing 20 meq potassium bid x 3,  then once daily thereafter

## 2023-05-11 ENCOUNTER — Telehealth: Payer: Self-pay | Admitting: Internal Medicine

## 2023-05-11 NOTE — Telephone Encounter (Signed)
 Lft pt vm to call ofc to sch CT. thanks

## 2023-05-12 ENCOUNTER — Other Ambulatory Visit
Admission: RE | Admit: 2023-05-12 | Discharge: 2023-05-12 | Disposition: A | Discharge status: Home / Self Care | Payer: Medicare Other | Source: Ambulatory Visit | Attending: Internal Medicine | Admitting: Internal Medicine

## 2023-05-12 ENCOUNTER — Telehealth: Payer: Self-pay

## 2023-05-12 ENCOUNTER — Encounter: Payer: Self-pay | Admitting: Internal Medicine

## 2023-05-12 DIAGNOSIS — T3695XA Adverse effect of unspecified systemic antibiotic, initial encounter: Secondary | ICD-10-CM | POA: Diagnosis not present

## 2023-05-12 DIAGNOSIS — K521 Toxic gastroenteritis and colitis: Secondary | ICD-10-CM | POA: Diagnosis not present

## 2023-05-12 LAB — C DIFFICILE QUICK SCREEN W PCR REFLEX
C Diff antigen: NEGATIVE
C Diff interpretation: NOT DETECTED
C Diff toxin: NEGATIVE

## 2023-05-12 NOTE — Telephone Encounter (Signed)
 Copied from CRM [id number]. Topic: Clinical - Lab/Test Results >> May 12, 2023  3:56 PM Prudencio Pair wrote: Reason for CRM: Patient calling to see if her results from C-diff has came back yet. Advised pt that as of now, there are no results. Please contact pt once the results are back.

## 2023-05-15 NOTE — Telephone Encounter (Signed)
 Pt has seen and read the result note message that was sent to her.

## 2023-05-18 ENCOUNTER — Ambulatory Visit: Payer: Medicare Other

## 2023-05-26 ENCOUNTER — Encounter: Payer: Self-pay | Admitting: Family Medicine

## 2023-05-26 ENCOUNTER — Telehealth: Admitting: Family Medicine

## 2023-05-26 VITALS — Ht 65.0 in | Wt 178.6 lb

## 2023-05-26 DIAGNOSIS — R42 Dizziness and giddiness: Secondary | ICD-10-CM | POA: Diagnosis not present

## 2023-05-26 DIAGNOSIS — S20461A Insect bite (nonvenomous) of right back wall of thorax, initial encounter: Secondary | ICD-10-CM

## 2023-05-26 DIAGNOSIS — W57XXXA Bitten or stung by nonvenomous insect and other nonvenomous arthropods, initial encounter: Secondary | ICD-10-CM | POA: Diagnosis not present

## 2023-05-26 MED ORDER — DOXYCYCLINE HYCLATE 100 MG PO TABS: 100.0000 mg | ORAL_TABLET | Freq: Two times a day (BID) | ORAL | 0 refills | Status: AC

## 2023-05-26 MED ORDER — SACCHAROMYCES BOULARDII 250 MG PO CAPS: 250.0000 mg | ORAL_CAPSULE | Freq: Every day | ORAL | 0 refills | Status: AC

## 2023-05-26 NOTE — Progress Notes (Signed)
 Virtual Visit via Video note  I connected with Sherri Lee on 05/31/23 at 259 by video and verified that I am speaking with the correct person using two identifiers. Sherri Lee is currently located at home and  is currently alone during visit. The provider, Dana Allan, MD is located in their office at time of visit.  I discussed the limitations, risks, security and privacy concerns of performing an evaluation and management service by video and the availability of in person appointments. I also discussed with the patient that there may be a patient responsible charge related to this service. The patient expressed understanding and agreed to proceed.  Subjective: PCP: Sherri Shams, MD  Chief Complaint  Patient presents with   Tick Removal    On back the end of last week    Dizziness    Since she pulled the tick off.     HPI Discussed the use of AI scribe software for clinical note transcription with the patient, who gave verbal consent to proceed.  History of Present Illness Sherri Lee is a 68 year old female who presents with dizziness and vertigo following a tick bite.  She has been experiencing dizziness and vertigo since Saturday night, making it difficult for her to stand or walk without support. She needs to hold onto objects or spread her legs to maintain balance, fearing she might fall. She has been mostly bedridden or sitting in a recliner since Sunday to minimize movement, which seems to help. She has never experienced vertigo like this before. No numbness, tingling, slurred speech, or facial weakness. No fever, but she feels exhausted and lacks energy.  A few days ago, she discovered a tick on her right upper back near the shoulder, which she believes was there for about a week. Initially mistaking it for a mole, she removed it with tweezers after several attempts. The area is now sore, red, and raised, possibly due to her  attempts to remove the tick. She is unsure of the type of tick and is concerned about the possibility of Lyme disease. She has not noticed any rash at the site of the tick bite.  She had the flu at the end of October, which left her with lingering congestion in her lungs and intermittent issues with her left ear feeling closed up. She has a history of sinus headaches and allergies, for which she used to receive allergy shots. She reports no new or worsening headaches, although she currently has pain behind her eyes, which is not unusual for her.  She lives alone and has been helping her 18 year old mother move into independent living, which involved working in a garage where she believes she may have acquired the tick. She is not typically outdoorsy and has not had many ticks on her before.   ROS: Per HPI  Current Outpatient Medications:    amLODipine (NORVASC) 5 MG tablet, TAKE 1 TABLET (5 MG TOTAL) BY MOUTH DAILY., Disp: 90 tablet, Rfl: 1   atorvastatin (LIPITOR) 20 MG tablet, Take 1 tablet (20 mg total) by mouth daily., Disp: 90 tablet, Rfl: 3   cyclobenzaprine (FLEXERIL) 5 MG tablet, TAKE 1 TABLET BY MOUTH EVERYDAY AT BEDTIME, Disp: 90 tablet, Rfl: 1   diclofenac Sodium (VOLTAREN) 1 % GEL, Apply 2 g topically 4 (four) times daily., Disp: 100 g, Rfl: G   doxycycline (VIBRA-TABS) 100 MG tablet, Take 1 tablet (100 mg total) by mouth 2 (two) times daily for 10 days.,  Disp: 20 tablet, Rfl: 0   escitalopram (LEXAPRO) 20 MG tablet, Take 20 mg by mouth daily. , Disp: , Rfl:    estradiol (ESTRACE) 0.1 MG/GM vaginal cream, Place 1 Applicatorful vaginally 3 (three) times a week., Disp: 42.5 g, Rfl: 11   fluticasone (FLONASE) 50 MCG/ACT nasal spray, Place 2 sprays into both nostrils daily as needed for allergies or rhinitis. After nasal saline, Disp: 48 mL, Rfl: 5   furosemide (LASIX) 20 MG tablet, Take 1 tablet (20 mg total) by mouth as needed for edema., Disp: 20 tablet, Rfl: 1   gabapentin (NEURONTIN)  100 MG capsule, TAKE 1 CAPSULE (100 MG TOTAL) BY MOUTH 3 TIMES A DAY, Disp: 270 capsule, Rfl: 3   latanoprost (XALATAN) 0.005 % ophthalmic solution, 1 drop at bedtime., Disp: , Rfl:    LORazepam (ATIVAN) 1 MG tablet, TAKE 1 TABLET BY MOUTH TWICE A DAY AS DIRECTED FOR ANXIETY/PANIC, Disp: , Rfl: 2   metoprolol succinate (TOPROL-XL) 50 MG 24 hr tablet, TAKE 0.5 TABLETS (25 MG TOTAL) BY MOUTH DAILY. TAKE WITH OR IMMEDIATELY FOLLOWING A MEAL., Disp: 45 tablet, Rfl: 3   Multiple Vitamin (MULTIVITAMIN) tablet, Take 1 tablet by mouth daily. Bariatric Vitamin, Disp: , Rfl:    oxybutynin (DITROPAN-XL) 10 MG 24 hr tablet, TAKE 1 TABLET BY MOUTH EVERYDAY AT BEDTIME, Disp: 90 tablet, Rfl: 1   pantoprazole (PROTONIX) 40 MG tablet, TAKE 1 TABLET BY MOUTH EVERY DAY, Disp: 90 tablet, Rfl: 3   potassium chloride SA (KLOR-CON M) 20 MEQ tablet, Take 1 tablet (20 mEq total) by mouth 2 (two) times daily. For 3 days,  then once daily thereafter, Disp: 33 tablet, Rfl: 0   saccharomyces boulardii (FLORASTOR) 250 MG capsule, Take 1 capsule (250 mg total) by mouth daily., Disp: 90 capsule, Rfl: 0   traMADol (ULTRAM) 50 MG tablet, Take 1 tablet (50 mg total) by mouth every 12 (twelve) hours as needed., Disp: 60 tablet, Rfl: 4   VITAMIN D PO, Take by mouth., Disp: , Rfl:    zolpidem (AMBIEN) 10 MG tablet, Take 10 mg by mouth at bedtime as needed for sleep., Disp: , Rfl:    acyclovir (ZOVIRAX) 400 MG tablet, Take 1 tablet (400 mg total) by mouth 5 (five) times daily. (Patient not taking: Reported on 05/26/2023), Disp: 35 tablet, Rfl: 3   albuterol (VENTOLIN HFA) 108 (90 Base) MCG/ACT inhaler, Inhale 2 puffs into the lungs every 6 (six) hours as needed for wheezing or shortness of breath. (Patient not taking: Reported on 05/26/2023), Disp: 8 g, Rfl: 2   calcium carbonate (OS-CAL) 600 MG TABS, Take 600 mg by mouth 2 (two) times daily with a meal. (Patient not taking: Reported on 05/26/2023), Disp: , Rfl:    Observations/Objective: Physical Exam Pulmonary:     Effort: Pulmonary effort is normal.  Neurological:     Mental Status: She is alert and oriented to person, place, and time. Mental status is at baseline.  Psychiatric:        Mood and Affect: Mood normal.        Behavior: Behavior normal.        Thought Content: Thought content normal.        Judgment: Judgment normal.    Assessment and Plan: Tick bite of right back wall of thorax, initial encounter Assessment & Plan: Tick attached for approximately a week, removed a few days ago. No rash, but soreness and redness present. Unknown tick type, prophylactic treatment for Lyme disease initiated due to  attachment duration. - Prescribe doxycycline for 10 days for possible Lyme disease. - Advise taking probiotics daily during antibiotic course and for 14 days after. - Provide information on Lyme disease symptoms to monitor. - Advise follow-up with primary care provider next week.  Orders: -     Doxycycline Hyclate; Take 1 tablet (100 mg total) by mouth 2 (two) times daily for 10 days.  Dispense: 20 tablet; Refill: 0 -     Saccharomyces boulardii; Take 1 capsule (250 mg total) by mouth daily.  Dispense: 90 capsule; Refill: 0  Vertigo Assessment & Plan: Acute vertigo with dizziness, exacerbated by movement. Differential includes inner ear issues and tick-borne illness. Initiated treatment for possible Lyme disease due to tick exposure. - Instruct to monitor for worsening symptoms such as slurred speech, double vision, or increased dizziness and to go to the emergency department if these occur. - Recommend follow-up with primary care provider next week.     Follow Up Instructions: Return in about 1 week (around 06/02/2023) for PCP.   I discussed the assessment and treatment plan with the patient. The patient was provided an opportunity to ask questions and all were answered. The patient agreed with the plan and demonstrated an  understanding of the instructions.   The patient was advised to call back or seek an in-person evaluation if the symptoms worsen or if the condition fails to improve as anticipated.  The above assessment and management plan was discussed with the patient. The patient verbalized understanding of and has agreed to the management plan. Patient is aware to call the clinic if symptoms persist or worsen. Patient is aware when to return to the clinic for a follow-up visit. Patient educated on when it is appropriate to go to the emergency department.     Dana Allan, MD

## 2023-05-26 NOTE — Patient Instructions (Addendum)
 It was a pleasure meeting you today. Thank you for allowing me to take part in your health care.  Our goals for today as we discussed include:  Start Doxycycline 1 tablet two times a day Start Probiotics daily and continue for 14 days after treatment  Instruct to monitor for worsening symptoms such as slurred speech, double vision, or increased dizziness and to go to the emergency department if these occur.  This is a list of the screening recommended for you and due dates:  Health Maintenance  Topic Date Due   Zoster (Shingles) Vaccine (1 of 2) Never done   Mammogram  02/25/2017   DEXA scan (bone density measurement)  Never done   Eye exam for diabetics  03/14/2022   DTaP/Tdap/Td vaccine (3 - Td or Tdap) 07/13/2022   COVID-19 Vaccine (5 - 2024-25 season) 11/13/2022   Hemoglobin A1C  12/29/2022   Yearly kidney health urinalysis for diabetes  06/29/2023   Flu Shot  06/12/2023*   Medicare Annual Wellness Visit  04/30/2024   Yearly kidney function blood test for diabetes  05/08/2024   Colon Cancer Screening  11/14/2032   Pneumonia Vaccine  Completed   Hepatitis C Screening  Completed   HPV Vaccine  Aged Out  *Topic was postponed. The date shown is not the original due date.     If you have any questions or concerns, please do not hesitate to call the office at [phone].  I look forward to our next visit and until then take care and stay safe.  Regards,   Dana Allan, MD   [facility]

## 2023-05-31 ENCOUNTER — Encounter: Payer: Self-pay | Admitting: Family Medicine

## 2023-05-31 DIAGNOSIS — R42 Dizziness and giddiness: Secondary | ICD-10-CM | POA: Insufficient documentation

## 2023-05-31 DIAGNOSIS — W57XXXA Bitten or stung by nonvenomous insect and other nonvenomous arthropods, initial encounter: Secondary | ICD-10-CM | POA: Insufficient documentation

## 2023-05-31 NOTE — Assessment & Plan Note (Signed)
 Acute vertigo with dizziness, exacerbated by movement. Differential includes inner ear issues and tick-borne illness. Initiated treatment for possible Lyme disease due to tick exposure. - Instruct to monitor for worsening symptoms such as slurred speech, double vision, or increased dizziness and to go to the emergency department if these occur. - Recommend follow-up with primary care provider next week.

## 2023-05-31 NOTE — Assessment & Plan Note (Addendum)
 Tick attached for approximately a week, removed a few days ago. No rash, but soreness and redness present. Unknown tick type, prophylactic treatment for Lyme disease initiated due to attachment duration. - Prescribe doxycycline for 10 days for possible Lyme disease. - Advise taking probiotics daily during antibiotic course and for 14 days after. - Provide information on Lyme disease symptoms to monitor. - Advise follow-up with primary care provider next week.

## 2023-06-02 ENCOUNTER — Encounter: Payer: Self-pay | Admitting: Internal Medicine

## 2023-06-02 ENCOUNTER — Ambulatory Visit (INDEPENDENT_AMBULATORY_CARE_PROVIDER_SITE_OTHER): Admitting: Internal Medicine

## 2023-06-02 VITALS — BP 120/76 | HR 88 | Temp 98.6°F | Wt 179.0 lb

## 2023-06-02 DIAGNOSIS — H8109 Meniere's disease, unspecified ear: Secondary | ICD-10-CM

## 2023-06-02 DIAGNOSIS — I700 Atherosclerosis of aorta: Secondary | ICD-10-CM

## 2023-06-02 DIAGNOSIS — I10 Essential (primary) hypertension: Secondary | ICD-10-CM

## 2023-06-02 DIAGNOSIS — J329 Chronic sinusitis, unspecified: Secondary | ICD-10-CM

## 2023-06-02 DIAGNOSIS — E785 Hyperlipidemia, unspecified: Secondary | ICD-10-CM | POA: Diagnosis not present

## 2023-06-02 DIAGNOSIS — R42 Dizziness and giddiness: Secondary | ICD-10-CM

## 2023-06-02 DIAGNOSIS — T502X5A Adverse effect of carbonic-anhydrase inhibitors, benzothiadiazides and other diuretics, initial encounter: Secondary | ICD-10-CM

## 2023-06-02 DIAGNOSIS — R7303 Prediabetes: Secondary | ICD-10-CM

## 2023-06-02 DIAGNOSIS — F322 Major depressive disorder, single episode, severe without psychotic features: Secondary | ICD-10-CM

## 2023-06-02 DIAGNOSIS — E876 Hypokalemia: Secondary | ICD-10-CM | POA: Diagnosis not present

## 2023-06-02 DIAGNOSIS — W57XXXD Bitten or stung by nonvenomous insect and other nonvenomous arthropods, subsequent encounter: Secondary | ICD-10-CM | POA: Diagnosis not present

## 2023-06-02 DIAGNOSIS — E119 Type 2 diabetes mellitus without complications: Secondary | ICD-10-CM

## 2023-06-02 DIAGNOSIS — S20461D Insect bite (nonvenomous) of right back wall of thorax, subsequent encounter: Secondary | ICD-10-CM

## 2023-06-02 DIAGNOSIS — Z1231 Encounter for screening mammogram for malignant neoplasm of breast: Secondary | ICD-10-CM

## 2023-06-02 MED ORDER — FLUTICASONE PROPIONATE 50 MCG/ACT NA SUSP: 2.0000 | Freq: Every day | NASAL | 5 refills | Status: AC | PRN

## 2023-06-02 MED ORDER — PREDNISONE 10 MG PO TABS: ORAL_TABLET | ORAL | 0 refills | Status: DC

## 2023-06-02 MED ORDER — AZELASTINE HCL 0.1 % NA SOLN: 1.0000 | Freq: Two times a day (BID) | NASAL | 12 refills | Status: AC

## 2023-06-02 NOTE — Assessment & Plan Note (Signed)
 Prednisone taper,  flonase and azelastine recommended

## 2023-06-02 NOTE — Patient Instructions (Addendum)
 The vaccines you are due for   TdaP  Shingrix RSV   I have reviewed your prior screening test for nephropathy, and your corrected UaCE was normal , so no medication changes are needed   YOU DO NOT NEED IRON.  YOU WERE LOOKING AT THE WRONG COLUMNS.  YOUR IRON IS NORMAL   YOUR VERTIGO SHOULD RESOLVE WITH PREDNISONE TAPER AND FLONASE/ASTELIN FOR ALLERGIES

## 2023-06-02 NOTE — Progress Notes (Signed)
 Subjective:  Patient ID: Sherri Lee, female    DOB: 04-26-1955  Age: 68 y.o. MRN: [id number]  CC: The primary encounter diagnosis was Essential hypertension. Diagnoses of Diabetes mellitus without complication (HCC), Hyperlipidemia, unspecified hyperlipidemia type, Sinusitis, unspecified chronicity, unspecified location, Vertigo, Breast cancer screening by mammogram, Hypokalemia, Tick bite of right back wall of thorax, subsequent encounter, Vertigo, labyrinthine, unspecified laterality, Prediabetes, Diuretic-induced hypokalemia, Aortic atherosclerosis (HCC), and Severe depression (HCC) were also pertinent to this visit.   HPI [name] Shakirah Kirkey presents for  Chief Complaint  Patient presents with   follow up on tick bite and vertigo   1) Recent tick bite  on left upper thorax.posterior should.  She was  treated by [facility] after removing tick from shoulder .  She has had no symptoms of RMSF  or Lymes arthritis  2) She has been having vertigo and left ear   has been "popping"  since then.  She attributes it to allergies since  she has been spending time outside helping her mother move.   3) Reviewed findings of prior CT scan today..  Patient is tolerating high potency statin therapy    4)  Hypertension: patient checks blood pressure twice weekly at home.  Readings have been for the most part <130/80 at rest . Patient is following a reduced salt diet most days and is taking medications as prescribed   5) depression:  mood has been stable    Outpatient Medications Prior to Visit  Medication Sig Dispense Refill   acyclovir (ZOVIRAX) 400 MG tablet Take 1 tablet (400 mg total) by mouth 5 (five) times daily. 35 tablet 3   albuterol (VENTOLIN HFA) 108 (90 Base) MCG/ACT inhaler Inhale 2 puffs into the lungs every 6 (six) hours as needed for wheezing or shortness of breath. 8 g 2   amLODipine (NORVASC) 5 MG tablet TAKE 1 TABLET (5 MG TOTAL) BY MOUTH DAILY. 90 tablet 1    atorvastatin (LIPITOR) 20 MG tablet Take 1 tablet (20 mg total) by mouth daily. 90 tablet 3   calcium carbonate (OS-CAL) 600 MG TABS Take 600 mg by mouth 2 (two) times daily with a meal.     cyclobenzaprine (FLEXERIL) 5 MG tablet TAKE 1 TABLET BY MOUTH EVERYDAY AT BEDTIME 90 tablet 1   diclofenac Sodium (VOLTAREN) 1 % GEL Apply 2 g topically 4 (four) times daily. 100 g G   doxycycline (VIBRA-TABS) 100 MG tablet Take 1 tablet (100 mg total) by mouth 2 (two) times daily for 10 days. 20 tablet 0   escitalopram (LEXAPRO) 20 MG tablet Take 20 mg by mouth daily.      estradiol (ESTRACE) 0.1 MG/GM vaginal cream Place 1 Applicatorful vaginally 3 (three) times a week. 42.5 g 11   furosemide (LASIX) 20 MG tablet Take 1 tablet (20 mg total) by mouth as needed for edema. 20 tablet 1   gabapentin (NEURONTIN) 100 MG capsule TAKE 1 CAPSULE (100 MG TOTAL) BY MOUTH 3 TIMES A DAY 270 capsule 3   latanoprost (XALATAN) 0.005 % ophthalmic solution 1 drop at bedtime.     LORazepam (ATIVAN) 1 MG tablet TAKE 1 TABLET BY MOUTH TWICE A DAY AS DIRECTED FOR ANXIETY/PANIC  2   metoprolol succinate (TOPROL-XL) 50 MG 24 hr tablet TAKE 0.5 TABLETS (25 MG TOTAL) BY MOUTH DAILY. TAKE WITH OR IMMEDIATELY FOLLOWING A MEAL. 45 tablet 3   Multiple Vitamin (MULTIVITAMIN) tablet Take 1 tablet by mouth daily. Bariatric Vitamin     oxybutynin (DITROPAN-XL)  10 MG 24 hr tablet TAKE 1 TABLET BY MOUTH EVERYDAY AT BEDTIME 90 tablet 1   pantoprazole (PROTONIX) 40 MG tablet TAKE 1 TABLET BY MOUTH EVERY DAY 90 tablet 3   potassium chloride SA (KLOR-CON M) 20 MEQ tablet Take 1 tablet (20 mEq total) by mouth 2 (two) times daily. For 3 days,  then once daily thereafter 33 tablet 0   saccharomyces boulardii (FLORASTOR) 250 MG capsule Take 1 capsule (250 mg total) by mouth daily. 90 capsule 0   traMADol (ULTRAM) 50 MG tablet Take 1 tablet (50 mg total) by mouth every 12 (twelve) hours as needed. 60 tablet 4   VITAMIN D PO Take by mouth.     zolpidem  (AMBIEN) 10 MG tablet Take 10 mg by mouth at bedtime as needed for sleep.     fluticasone (FLONASE) 50 MCG/ACT nasal spray Place 2 sprays into both nostrils daily as needed for allergies or rhinitis. After nasal saline 48 mL 5   No facility-administered medications prior to visit.    Review of Systems;  Patient denies headache, fevers, malaise, unintentional weight loss, skin rash, eye pain, sinus congestion and sinus pain, sore throat, dysphagia,  hemoptysis , cough, dyspnea, wheezing, chest pain, palpitations, orthopnea, edema, abdominal pain, nausea, melena, diarrhea, constipation, flank pain, dysuria, hematuria, urinary  Frequency, nocturia, numbness, tingling, seizures,  Focal weakness, Loss of consciousness,  Tremor, insomnia, depression, anxiety, and suicidal ideation.      Objective:  BP 120/76   Pulse 88   Temp 98.6 F (37 C)   Wt 179 lb (81.2 kg)   SpO2 99%   BMI 29.79 kg/m   BP Readings from Last 3 Encounters:  06/02/23 120/76  05/09/23 108/74  04/21/23 120/80    Wt Readings from Last 3 Encounters:  06/02/23 179 lb (81.2 kg)  05/26/23 178 lb 9 oz (81 kg)  05/09/23 178 lb 9.6 oz (81 kg)    Physical Exam Vitals reviewed.  Constitutional:      General: She is not in acute distress.    Appearance: Normal appearance. She is normal weight. She is not ill-appearing, toxic-appearing or diaphoretic.  HENT:     Head: Normocephalic.  Eyes:     General: No scleral icterus.       Right eye: No discharge.        Left eye: No discharge.     Conjunctiva/sclera: Conjunctivae normal.  Cardiovascular:     Rate and Rhythm: Normal rate and regular rhythm.     Heart sounds: Normal heart sounds.  Pulmonary:     Effort: Pulmonary effort is normal. No respiratory distress.     Breath sounds: Normal breath sounds.  Musculoskeletal:        General: Normal range of motion.  Skin:    General: Skin is warm and dry.  Neurological:     General: No focal deficit present.      Mental Status: She is alert and oriented to person, place, and time. Mental status is at baseline.  Psychiatric:        Mood and Affect: Mood normal.        Behavior: Behavior normal.        Thought Content: Thought content normal.        Judgment: Judgment normal.    Lab Results  Component Value Date   HGBA1C 5.9 (H) 06/02/2023   HGBA1C 6.4 06/29/2022   HGBA1C 5.9 (A) 12/31/2021    Lab Results  Component Value Date  CREATININE 0.85 06/02/2023   CREATININE 0.63 05/09/2023   CREATININE 0.71 04/11/2023    Lab Results  Component Value Date   WBC 5.6 05/09/2023   HGB 11.9 (L) 05/09/2023   HCT 36.0 05/09/2023   PLT 234.0 05/09/2023   GLUCOSE 115 (H) 06/02/2023   CHOL 133 06/02/2023   TRIG 66 06/02/2023   HDL 64 06/02/2023   LDLDIRECT 66 06/02/2023   LDLCALC 54 06/02/2023   ALT 27 05/09/2023   AST 32 05/09/2023   NA 140 06/02/2023   K 3.6 06/02/2023   CL 105 06/02/2023   CREATININE 0.85 06/02/2023   BUN 8 06/02/2023   CO2 25 06/02/2023   TSH 0.86 05/09/2023   HGBA1C 5.9 (H) 06/02/2023   MICROALBUR <0.7 06/29/2022    No results found.  Assessment & Plan:  .Essential hypertension Assessment & Plan: Managed with metoprolol dose 50 mg daily to manage tachycardia and amlodipine  at 5 mg    Diabetes mellitus without complication (HCC) -     Hemoglobin A1c  Hyperlipidemia, unspecified hyperlipidemia type -     Lipid panel -     LDL cholesterol, direct  Sinusitis, unspecified chronicity, unspecified location -     Fluticasone Propionate; Place 2 sprays into both nostrils daily as needed for allergies or rhinitis. After nasal saline  Dispense: 48 mL; Refill: 5  Vertigo Assessment & Plan: Prednisone taper,  flonase and azelastine recommended    Breast cancer screening by mammogram -     Digital Screening Mammogram, Left and Right; Future  Hypokalemia -     Basic metabolic panel -     Magnesium  Tick bite of right back wall of thorax, subsequent  encounter Assessment & Plan: Tshe hah a tick attached for approximately a week which resulted soreness and redness with petechial rash .Marland Kitchen She received prophylactic treatment for Lyme disease  due to attachment duration  and the redness has resolved based on today's exam    Vertigo, labyrinthine, unspecified laterality Assessment & Plan: Presumed secondary to Eustachian tube inflammation from allergies.  Prednisone taper , flonase and azelastine prescribed    Prediabetes Assessment & Plan: A1c was nearly diagnostic of type 2 DM but has improved  an maintained at > 6.0.   ShI have addressed  BMI and recommended a low glycemic index diet utilizing smaller more frequent meals to increase metabolism and prevent enlargement of stomach .  I have also recommended that patient start exercising with a goal of 30 minutes of walking a minimum of 5 days per week.   Lab Results  Component Value Date   HGBA1C 5.9 (H) 06/02/2023      Diuretic-induced hypokalemia Assessment & Plan: Normalized with  20 meq potassium  once daily  Lab Results  Component Value Date   NA 140 06/02/2023   K 3.6 06/02/2023   CL 105 06/02/2023   CO2 25 06/02/2023      Aortic atherosclerosis (HCC) Assessment & Plan: Reviewed findings of prior CT scan today..  Patient is tolerating high potency statin therapy    Severe depression (HCC) Assessment & Plan: She has  increased responsibilities caring for parents who live independent of each other.  Her affect is not depressed ; however, and she is not suicidal.  Continue follow up with Dr Janeece Riggers   Other orders -     Azelastine HCl; Place 1 spray into both nostrils 2 (two) times daily. Use in each nostril as directed  Dispense: 30 mL; Refill: 12 -  predniSONE; 6 tablets on Day 1 , then reduce by 1 tablet daily until gone  Dispense: 21 tablet; Refill: 0    Follow-up: Return in about 6 months (around 12/03/2023) for chronic pain management.   [name], MD

## 2023-06-03 LAB — BASIC METABOLIC PANEL
BUN: 8 mg/dL (ref 7–25)
CO2: 25 mmol/L (ref 20–32)
Calcium: 9 mg/dL (ref 8.6–10.4)
Chloride: 105 mmol/L (ref 98–110)
Creat: 0.85 mg/dL (ref 0.50–1.05)
Glucose, Bld: 115 mg/dL — ABNORMAL HIGH (ref 65–99)
Potassium: 3.6 mmol/L (ref 3.5–5.3)
Sodium: 140 mmol/L (ref 135–146)
eGFR: 75 mL/min/{1.73_m2} (ref >=60)

## 2023-06-03 LAB — HEMOGLOBIN A1C
Hgb A1c MFr Bld: 5.9 %{Hb} — ABNORMAL HIGH (ref <5.7)
Mean Plasma Glucose: 123 mg/dL
eAG (mmol/L): 6.8 mmol/L

## 2023-06-03 LAB — LIPID PANEL
Cholesterol: 133 mg/dL (ref <200)
HDL: 64 mg/dL (ref >=50)
LDL Cholesterol (Calc): 54 mg/dL
Non-HDL Cholesterol (Calc): 69 mg/dL (ref <130)
Total CHOL/HDL Ratio: 2.1 (calc) (ref <5.0)
Triglycerides: 66 mg/dL (ref <150)

## 2023-06-03 LAB — LDL CHOLESTEROL, DIRECT: Direct LDL: 66 mg/dL (ref <100)

## 2023-06-03 LAB — MAGNESIUM: Magnesium: 1.6 mg/dL (ref 1.5–2.5)

## 2023-06-04 DIAGNOSIS — H8109 Meniere's disease, unspecified ear: Secondary | ICD-10-CM | POA: Insufficient documentation

## 2023-06-04 NOTE — Assessment & Plan Note (Signed)
 Reviewed findings of prior CT scan today..  Patient is tolerating high potency statin therapy

## 2023-06-04 NOTE — Assessment & Plan Note (Signed)
 She has  increased responsibilities caring for parents who live independent of each other.  Her affect is not depressed ; however, and she is not suicidal.  Continue follow up with Dr Janeece Riggers

## 2023-06-04 NOTE — Assessment & Plan Note (Signed)
 Managed with metoprolol dose 50 mg daily to manage tachycardia and amlodipine  at 5 mg

## 2023-06-04 NOTE — Assessment & Plan Note (Signed)
 Presumed secondary to Eustachian tube inflammation from allergies.  Prednisone taper , flonase and azelastine prescribed

## 2023-06-04 NOTE — Assessment & Plan Note (Signed)
 A1c was nearly diagnostic of type 2 DM but has improved  an maintained at > 6.0.   ShI have addressed  BMI and recommended a low glycemic index diet utilizing smaller more frequent meals to increase metabolism and prevent enlargement of stomach .  I have also recommended that patient start exercising with a goal of 30 minutes of walking a minimum of 5 days per week.   Lab Results  Component Value Date   HGBA1C 5.9 (H) 06/02/2023

## 2023-06-04 NOTE — Assessment & Plan Note (Signed)
 Tshe hah a tick attached for approximately a week which resulted soreness and redness with petechial rash .Marland Kitchen She received prophylactic treatment for Lyme disease  due to attachment duration  and the redness has resolved based on today's exam

## 2023-06-04 NOTE — Assessment & Plan Note (Signed)
 Normalized with  20 meq potassium  once daily  Lab Results  Component Value Date   NA 140 06/02/2023   K 3.6 06/02/2023   CL 105 06/02/2023   CO2 25 06/02/2023

## 2023-06-05 ENCOUNTER — Encounter: Payer: Self-pay | Admitting: Internal Medicine

## 2023-06-06 ENCOUNTER — Other Ambulatory Visit: Payer: Self-pay | Admitting: Internal Medicine

## 2023-06-07 ENCOUNTER — Ambulatory Visit: Payer: Medicare Other | Admitting: Internal Medicine

## 2023-08-01 ENCOUNTER — Other Ambulatory Visit: Payer: Self-pay | Admitting: Internal Medicine

## 2023-09-01 ENCOUNTER — Telehealth: Payer: Self-pay | Admitting: Internal Medicine

## 2023-09-01 NOTE — Telephone Encounter (Signed)
 Pt could not verify prescription dosage. Original script is by Doc outside of [facility]

## 2023-09-01 NOTE — Telephone Encounter (Signed)
 Pt has stopped in asking for a fill of even just a few of this script. Prescribing Dr is outside of [facility]. Dr is Dr Sheria Dills Su. Script has no refills and runs out this weekend. As discussed with [employer], I'm putting the note back, I did tell pt this is not a guarantee that it will be filled. -KH  Lorazepam  1MG  tablets, 2.5 per day. Information via note pt brought in from preferred pharmacy, [facility] [address]   Please contact pt at [phone] with decision.

## 2023-09-01 NOTE — Telephone Encounter (Deleted)
 Prescription Request  09/01/2023  LOV: 06/02/2023  What is the name of the medication or equipment? Lorazepam    Have you contacted your pharmacy to request a refill? {yes/no:[phone]}   Which pharmacy would you like this sent to?  [id number] [place], [place] - [address] [place] [address] Phone: [phone] Fax: [phone]  [id number] [place] [facility] - [address] [place] [address] Phone: [phone] Fax: [phone]    Patient notified that their request is being sent to the clinical staff for review and that they should receive a response within 2 business days.   Please advise at {Home/[phone]}

## 2023-09-01 NOTE — Telephone Encounter (Signed)
 Pt is aware.

## 2023-09-04 ENCOUNTER — Other Ambulatory Visit: Payer: Self-pay | Admitting: Internal Medicine

## 2023-09-04 NOTE — Telephone Encounter (Signed)
 Medication was discontinued on 04/19/2023. Please refuse request.

## 2023-09-13 ENCOUNTER — Other Ambulatory Visit: Payer: Self-pay

## 2023-09-13 DIAGNOSIS — E119 Type 2 diabetes mellitus without complications: Secondary | ICD-10-CM

## 2023-09-19 ENCOUNTER — Other Ambulatory Visit: Payer: Self-pay | Admitting: Internal Medicine

## 2023-09-19 ENCOUNTER — Telehealth: Payer: Self-pay | Admitting: Internal Medicine

## 2023-09-19 DIAGNOSIS — M5030 Other cervical disc degeneration, unspecified cervical region: Secondary | ICD-10-CM

## 2023-09-19 DIAGNOSIS — M5134 Other intervertebral disc degeneration, thoracic region: Secondary | ICD-10-CM

## 2023-09-19 NOTE — Telephone Encounter (Unsigned)
 Copied from CRM [id number]. Topic: Clinical - Medication Refill >> Sep 19, 2023 11:15 AM Chasity T wrote: Medication: traMADol  (ULTRAM ) 50 MG tablet    Has the patient contacted their pharmacy? Yes  This is the patient's preferred pharmacy:   [id number] - [address] [place] [address] Phone: [phone] Fax: [phone]  Is this the correct pharmacy for this prescription? Yes If no, delete pharmacy and type the correct one.   Has the prescription been filled recently? Yes  Is the patient out of the medication? Yes  Has the patient been seen for an appointment in the last year OR does the patient have an upcoming appointment? Yes  Can we respond through MyChart? Yes  Agent: Please be advised that Rx refills may take up to 3 business days. We ask that you follow-up with your pharmacy.

## 2023-09-19 NOTE — Telephone Encounter (Signed)
 Tramadol  Refilled: 04/17/2023 Last OV: 06/02/2023 Next OV: 12/04/2023

## 2023-09-20 NOTE — Telephone Encounter (Signed)
Pt is aware that medication has been refilled.  °

## 2023-09-27 ENCOUNTER — Ambulatory Visit (INDEPENDENT_AMBULATORY_CARE_PROVIDER_SITE_OTHER): Admitting: Internal Medicine

## 2023-09-27 VITALS — BP 122/78 | HR 79 | Temp 98.2°F | Ht 65.0 in | Wt 178.4 lb

## 2023-09-27 DIAGNOSIS — M25552 Pain in left hip: Secondary | ICD-10-CM | POA: Diagnosis not present

## 2023-09-27 DIAGNOSIS — H8112 Benign paroxysmal vertigo, left ear: Secondary | ICD-10-CM | POA: Diagnosis not present

## 2023-09-27 DIAGNOSIS — H8110 Benign paroxysmal vertigo, unspecified ear: Secondary | ICD-10-CM | POA: Insufficient documentation

## 2023-09-27 DIAGNOSIS — H6122 Impacted cerumen, left ear: Secondary | ICD-10-CM | POA: Insufficient documentation

## 2023-09-27 DIAGNOSIS — L84 Corns and callosities: Secondary | ICD-10-CM | POA: Insufficient documentation

## 2023-09-27 MED ORDER — DEBROX 6.5 % OT SOLN
5.0000 [drp] | Freq: Two times a day (BID) | OTIC | 0 refills | Status: AC
Start: 2023-09-27 — End: ?

## 2023-09-27 NOTE — Progress Notes (Signed)
 Acute Office Visit  Subjective:     Patient ID: Sherri Lee, female    DOB: [birth year], 68 y.o.   MRN: [id number]  Chief Complaint  Patient presents with   Acute Visit    Left groin pain x 2 months-pain 8/10 Right foot bunion  Left ear has been stopped up since 12/24    HPI Patient is in today for left hip pain.  Patient states that she has had left hip pain for the last 2 months and it is progressively worsening.  Pain is in her left groin and worsens with ambulating long distances.  Denies any trauma.  No fevers or chills.  No tingling or numbness.  No weakness noted.  No pain on the right.  Patient also complains of intermittent vertigo when she changes head positions quickly and more when looking towards the left.  Vertigo lasts a few seconds and then resolves.  No nausea or vomiting.  No hearing loss.  Patient also complains of popping sensation in her left ear intermittently since her viral URI in January.  Patient's complaints of a callus on her right big toe.  States this has been there for a while and wants a referral to podiatry for this.  Review of Systems  Constitutional: Negative.   HENT:  Negative for ear pain, hearing loss and tinnitus.        Complains of popping sensation in her left ear intermittently  Respiratory: Negative.    Cardiovascular: Negative.   Gastrointestinal: Negative.   Musculoskeletal:  Positive for joint pain.       Patient complains of pain in her left hip  Patient also notes a callus in her right big toe and wants a referral to podiatry for this  Neurological:  Negative for focal weakness, weakness and headaches.       Intermittent vertigo lasting a few seconds and then resolving with sudden change in head movement  Psychiatric/Behavioral: Negative.          Objective:    BP 122/78   Pulse 79   Temp 98.2 F (36.8 C)   Ht 5' 5 (1.651 m)   Wt 178 lb 6.4 oz (80.9 kg)   SpO2 94%   BMI 29.69 kg/m    Physical  Exam Constitutional:      Appearance: Normal appearance.  HENT:     Head: Normocephalic and atraumatic.     Right Ear: Tympanic membrane, ear canal and external ear normal. There is no impacted cerumen.     Left Ear: There is impacted cerumen.     Ears:     Comments: Unable to visualize left TM secondary to impacted cerumen Cardiovascular:     Rate and Rhythm: Normal rate and regular rhythm.     Heart sounds: Normal heart sounds.  Pulmonary:     Effort: No respiratory distress.     Breath sounds: Normal breath sounds. No wheezing.  Musculoskeletal:     Comments: Patient with a negative straight leg test bilaterally but positive logrolling test on the left  Callus noted on the right big toe.  Patient also with hallux valgus deformity on the right  Neurological:     Mental Status: She is alert.     Comments: Fatigable nystagmus on looking to the left and vertigo was precipitated by going from lying down to sitting position which resolved in a few seconds.  Psychiatric:        Mood and Affect: Mood normal.  Behavior: Behavior normal.     No results found for any visits on 09/27/23.      Assessment & Plan:   Problem List Items Addressed This Visit       Nervous and Auditory   BPPV (benign paroxysmal positional vertigo)   - Patient complains of intermittent vertigo for the last few months with sudden change in head position.  Lasts a few seconds and then resolves -Patient was noted to have fatigable nystagmus on looking to the left.  Vertigo is precipitated by patient going from a lying to sitting down position which resolves in a few seconds -Patient with likely BPPV -Referral to vestibular PT was placed -No further workup at this time      Relevant Orders   PT vestibular rehab   Impacted cerumen, left ear   - Patient noted to have impacted cerumen in her left ear and we were unable to visualize left tympanic membrane -I have prescribed Debrox for her to see if  this will help clear the wax in her left ear -No further workup at this time      Relevant Medications   carbamide peroxide (DEBROX) 6.5 % OTIC solution     Musculoskeletal and Integument   Callus of toe   - Patient noted to have a callus over her right big toe as well as a hallux valgus deformity -Referral to podiatry was placed for her today -No further workup at this time      Relevant Orders   Ambulatory referral to Podiatry     Other   Left hip pain - Primary   - Patient complained of left hip pain for the last couple of months which is progressively been worsening -Pain is worse with ambulating long distances -Pain extends into the left groin -No trauma history -On exam, patient with a positive logrolling test on the left indicative of the left hip pathology -Will check left hip x-rays for further evaluation -I suspect this is likely left hip OA.  Depending on x-rays may refer to orthopedic for further evaluation -No further workup at this time      Relevant Orders   DG Hip Unilat W OR W/O Pelvis Min 4 Views Left    Meds ordered this encounter  Medications   carbamide peroxide (DEBROX) 6.5 % OTIC solution    Sig: Place 5 drops into the left ear 2 (two) times daily.    Dispense:  15 mL    Refill:  0    No follow-ups on file.  Chantale Leugers, MD

## 2023-09-27 NOTE — Assessment & Plan Note (Signed)
-   Patient complained of left hip pain for the last couple of months which is progressively been worsening -Pain is worse with ambulating long distances -Pain extends into the left groin -No trauma history -On exam, patient with a positive logrolling test on the left indicative of the left hip pathology -Will check left hip x-rays for further evaluation -I suspect this is likely left hip OA.  Depending on x-rays may refer to orthopedic for further evaluation -No further workup at this time

## 2023-09-27 NOTE — Patient Instructions (Signed)
-   It was a pleasure seeing you again -I put in referral to podiatry for the callus on your right big toe -We will check some x-rays of your left hip to see if you have arthritis causing the pain in your hip -I suspect that the vertigo that you are having is something called benign paroxysmal positional vertigo (BPPV).  The treatment for this is vestibular physical therapy.  I have put in referral to vestibular physical therapy for you -You also have impacted wax in the left ear.  I have prescribed Debrox to help with this issue -Please contact us  if you have any questions or concerns or if you need any refills

## 2023-09-27 NOTE — Assessment & Plan Note (Signed)
-   Patient complains of intermittent vertigo for the last few months with sudden change in head position.  Lasts a few seconds and then resolves -Patient was noted to have fatigable nystagmus on looking to the left.  Vertigo is precipitated by patient going from a lying to sitting down position which resolves in a few seconds -Patient with likely BPPV -Referral to vestibular PT was placed -No further workup at this time

## 2023-09-27 NOTE — Assessment & Plan Note (Signed)
-   Patient noted to have impacted cerumen in her left ear and we were unable to visualize left tympanic membrane -I have prescribed Debrox for her to see if this will help clear the wax in her left ear -No further workup at this time

## 2023-09-27 NOTE — Assessment & Plan Note (Signed)
-   Patient noted to have a callus over her right big toe as well as a hallux valgus deformity -Referral to podiatry was placed for her today -No further workup at this time

## 2023-10-06 ENCOUNTER — Ambulatory Visit: Payer: Self-pay

## 2023-10-06 ENCOUNTER — Telehealth: Payer: Self-pay | Admitting: Internal Medicine

## 2023-10-06 NOTE — Telephone Encounter (Signed)
 Called pt and advised that it was ordered here at the office, and she stated that she will get the x-ray Monday when she sees Dr. Marylynn.

## 2023-10-06 NOTE — Telephone Encounter (Signed)
 Copied from CRM [id number]. Topic: Clinical - Request for Lab/Test Order >> Oct 05, 2023  4:54 PM Viola F wrote: Reason for CRM: Patient would like a call back regarding where she should have x-ray done and where should she go for PT Vesibular rehab that is underneath active orders. Please call her at  [phone]

## 2023-10-06 NOTE — Telephone Encounter (Signed)
 FYI Only or Action Required?: FYI only for provider.  Patient was last seen in primary care on 09/27/2023 by Onesimo Claude, MD.  Called Nurse Triage reporting Hip Pain.  Symptoms began several weeks ago.  Interventions attempted: Prescription medications: tramadol  with minimal relief and Rest, hydration, or home remedies.  Symptoms are: gradually worsening.  Triage Disposition: See PCP When Office is Open (Within 3 Days)  Patient/caregiver understands and will follow disposition?: Yes    Copied from CRM #[id number]. Topic: Clinical - Red Word Triage >> Oct 06, 2023  4:36 PM Rosina BIRCH wrote: Red Word that prompted transfer to Nurse Triage: Patient stated she is having hip pain and it is getting worse as time goes on. The more she walks the more it hurts Reason for Disposition  [1] MODERATE pain (e.g., interferes with normal activities, limping) AND [2] present > 3 days  Answer Assessment - Initial Assessment Questions 1. LOCATION and RADIATION: Where is the pain located? Does the pain spread (shoot) anywhere else?     L hip shoots up from L groin 2. QUALITY: What does the pain feel like?  (e.g., sharp, dull, aching, burning)     I don't know how to explain it extremely painful if I try to walk for long 3. SEVERITY: How bad is the pain? What does it keep you from doing?   (Scale 1-10; or mild, moderate, severe)     8-9/10 with walking Improves without movement Endorses having Tramadol  with minimal relief 4. ONSET: When did the pain start? Does it come and go, or is it there all the time?     Weeks Endorses saw PCP last week, but sx are worsening 5. WORK OR EXERCISE: Has there been any recent work or exercise that involved this part of the body?      denies 6. CAUSE: What do you think is causing the hip pain?      unknown 7. AGGRAVATING FACTORS: What makes the hip pain worse? (e.g., walking, climbing stairs, running)     Worse with walking - endorses  taking small steps 8. OTHER SYMPTOMS: Do you have any other symptoms? (e.g., back pain, pain shooting down leg,  fever, rash)     denies  Protocols used: Hip Pain-A-AH

## 2023-10-06 NOTE — Telephone Encounter (Unsigned)
 Copied from CRM [id number]. Topic: Clinical - Request for Lab/Test Order >> Oct 06, 2023  4:36 PM Sherri Lee wrote: Patient called stating she is calling about a previous message regarding her xrays and where she needs to go. Patient stated her hip is is getting worse as time goes on. The more she walks the more it hurts CB [phone]

## 2023-10-09 ENCOUNTER — Ambulatory Visit: Admitting: Internal Medicine

## 2023-10-09 NOTE — Telephone Encounter (Signed)
 Pt has not done the hip xray yet

## 2023-10-11 ENCOUNTER — Ambulatory Visit (INDEPENDENT_AMBULATORY_CARE_PROVIDER_SITE_OTHER)

## 2023-10-11 ENCOUNTER — Encounter: Payer: Self-pay | Admitting: Oncology

## 2023-10-11 ENCOUNTER — Ambulatory Visit: Payer: Self-pay | Admitting: Podiatry

## 2023-10-11 VITALS — Ht 65.0 in | Wt 178.4 lb

## 2023-10-11 DIAGNOSIS — M21619 Bunion of unspecified foot: Secondary | ICD-10-CM

## 2023-10-11 DIAGNOSIS — M2011 Hallux valgus (acquired), right foot: Secondary | ICD-10-CM

## 2023-10-11 DIAGNOSIS — M21611 Bunion of right foot: Secondary | ICD-10-CM

## 2023-10-11 DIAGNOSIS — M21621 Bunionette of right foot: Secondary | ICD-10-CM | POA: Diagnosis not present

## 2023-10-11 NOTE — Patient Instructions (Addendum)
  VISIT SUMMARY: Today, we discussed the pain and discomfort you are experiencing from a bunion on your left foot, as well as the presence of a tailor's bunion on your fifth toe. We reviewed your symptoms, history of bunion surgery on your right foot, and family history of bunions. We explored both non-surgical and surgical treatment options to help manage your condition.  YOUR PLAN: -RIGHT HALLUX VALGUS (BUNION) WITH ASSOCIATED PAIN AND CALLUSES: A bunion is a bony bump that forms on the joint at the base of your big toe, causing pain and calluses. Non-surgical options include using toe spacers, bunion pads, and orthotics to help redistribute pressure and alleviate symptoms. If these measures are not sufficient and the bunion significantly affects your lifestyle, surgery may be considered. Surgery involves realigning the bone and carries risks such as infection and nerve issues. You may consider bunion pads with toe spacers available on [employer], and if you opt for orthotics, schedule an appointment with [facility] for casting and molding.  -RIGHT TAILOR'S BUNION (BUNIONETTE): A tailor's bunion is a bony bump that forms on the joint at the base of your little toe, causing discomfort. Non-surgical options such as toe spacers and bunion pads may provide relief. Surgical intervention is not discussed as a primary option at this time. Consider using toe spacers and bunion pads to alleviate discomfort.  INSTRUCTIONS: If you decide to opt for orthotics, please schedule an appointment with [facility] for casting and molding. If non-surgical measures are insufficient, we can discuss surgical options in more detail.                      Contains text generated by [employer]bridge.                                 Contains text generated by [employer]bridge.

## 2023-10-12 NOTE — Progress Notes (Signed)
 Subjective:  Patient ID: Sherri Lee, female    DOB: 1955-08-21,  MRN: [id number]  Chief Complaint  Patient presents with   Bunions    Rm 1 Patient is here for right foot bunion. Patient would like to explore non-surgical treatment first, possible orthotics.    Discussed the use of AI scribe software for clinical note transcription with the patient, who gave verbal consent to proceed.  History of Present Illness Sherri Lee is a 68 year old female who presents with pain and discomfort from a bunion on her left foot.  She experiences pain and discomfort from a bunion on her left foot, with calluses forming around the area and between her toes. The pain is primarily from the sides and bottom of her foot, exacerbated by the rubbing of her toes together, especially in closed shoes.  She has a history of bunion surgery on her right foot, which she found unpleasant, making her hesitant to undergo surgery again. She has tried using toe spacers to alleviate discomfort but finds them uncomfortable after prolonged use. No other treatments have been attempted for the current bunion.  During the review of symptoms, she reports a little pain in the second toe due to rubbing. She also notes the presence of a tailor's bunion on the fifth toe, which she was previously unaware of.  Bunions tend to run in her family, particularly on the female side, as her sister has also had bunion surgery.      Objective:    Physical Exam VASCULAR: DP and PT pulse palpable. Foot is warm and well-perfused. Capillary fill time is brisk. DERMATOLOGIC: Normal skin turgor, texture, and temperature. No open lesions, rashes, or ulcerations. NEUROLOGIC: Normal sensation to light touch and pressure. No paresthesias on examination. ORTHOPEDIC: Moderate to severe hallux valgus deformity with hypermobility of the first ray.  Tailor's bunion on the fifth toe. Smooth pain-free range of motion of all  examined joints. No ecchymosis or bruising. No gross deformity. No pain to palpation.   No images are attached to the encounter.    Results Right foot radiograph: Moderate to severe hallux valgus deformity with a large intermetatarsal angle (10/11/2023).   Assessment:   1. Bunion   2. Hallux valgus with bunions, right   3. Tailor's bunionette, right      Plan:  Patient was evaluated and treated and all questions answered.  Assessment and Plan Assessment & Plan Right hallux valgus (bunion) with associated pain and calluses Moderate to severe hallux valgus deformity with hypermobility of the first ray, causing pain and calluses. Symptoms are exacerbated by closed shoes and relieved by open shoes. Surgical intervention is the definitive treatment, specifically a Lapidus procedure, which involves realigning the bone. Risks of surgery include infection, nerve issues, and bone healing problems. Non-surgical options include toe spacers, bunion pads, and orthotics, which may help redistribute pressure and alleviate symptoms. Orthotics are not covered by insurance and cost $550 for a pair. Surgery is recommended if the bunion affects lifestyle significantly. Surgery is typically performed up to the age of 91 due to bone density concerns. - Consider bunion pads with toe spacers available on [employer]. - Consider orthotics to redistribute pressure and alleviate symptoms. - If opting for orthotics, schedule an appointment with [facility] for casting and molding. - Discuss surgical options if non-surgical measures are insufficient.  Right tailor's bunion (bunionette) Presence of a tailor's bunion on the fifth toe, contributing to discomfort. Non-surgical options such as toe spacers and bunion pads  may provide relief. Surgical intervention is not discussed as a primary option at this time. - Consider using toe spacers and bunion pads to alleviate discomfort.      Return if symptoms worsen or fail  to improve.

## 2023-10-13 ENCOUNTER — Telehealth: Payer: Self-pay

## 2023-10-13 DIAGNOSIS — H26491 Other secondary cataract, right eye: Secondary | ICD-10-CM | POA: Diagnosis not present

## 2023-10-13 DIAGNOSIS — H16223 Keratoconjunctivitis sicca, not specified as Sjogren's, bilateral: Secondary | ICD-10-CM | POA: Diagnosis not present

## 2023-10-13 DIAGNOSIS — H401131 Primary open-angle glaucoma, bilateral, mild stage: Secondary | ICD-10-CM | POA: Diagnosis not present

## 2023-10-13 DIAGNOSIS — Z961 Presence of intraocular lens: Secondary | ICD-10-CM | POA: Diagnosis not present

## 2023-10-13 NOTE — Telephone Encounter (Signed)
 Copied from CRM [id number]. Topic: Appointments - Scheduling Inquiry for Clinic >> Oct 12, 2023  4:53 PM Chasity T wrote: Reason for CRM: Patient is calling to get imaging scheduled. Orders are in. Please contact her regarding appointment.

## 2023-10-13 NOTE — Telephone Encounter (Signed)
 Routing to the provider who ordered the xray    Called and got pt scheduled on 10-18-23 for xray

## 2023-10-14 ENCOUNTER — Other Ambulatory Visit: Payer: Self-pay | Admitting: Internal Medicine

## 2023-10-14 DIAGNOSIS — M62838 Other muscle spasm: Secondary | ICD-10-CM

## 2023-10-14 DIAGNOSIS — M47812 Spondylosis without myelopathy or radiculopathy, cervical region: Secondary | ICD-10-CM

## 2023-10-17 ENCOUNTER — Ambulatory Visit: Payer: Self-pay | Admitting: *Deleted

## 2023-10-17 NOTE — Telephone Encounter (Signed)
 Copied from CRM #[id number]. Topic: Clinical - Red Word Triage >> Oct 17, 2023  3:11 PM Shereese L wrote: [facility] that prompted transfer to Nurse Triage: Patient fell and having in hip and lower back pain Reason for Disposition  [1] MODERATE back pain (e.g., interferes with normal activities) AND [2] present > 3 days    Pt fell.   Injured lower back  Answer Assessment - Initial Assessment Questions 1. ONSET: When did the pain begin? (e.g., minutes, hours, days)     I fell and I'm having lower back pain now.     I saw Dr. Narendra a week or two ago.  I  told him I'm having trouble with my hip.   The pain comes and goes in my left hip.  It's a pain on left side that has been going on for a while.  Sunday night I went to bathroom and tripped over something in the floor and fell.   My lower back has been hurting since then.    My arms are bruised but ok.    But my lower back is hurting.    I'm for a hip x ray but do I need to get an x ray of my lower back too?     It comes and goes and more is more frequent.    It's getting worse.   I broke a bone in my back 5 yrs ago.   I'm worried about that though this pain is below where the broken bone was.    The hip x ray is scheduled at the office.    I'm supposed to be there at 4:00 tomorrow (8/6) for the x ray.   Is it possible to have my lower back x rayed too while I'm getting the hip  x ray done?   2. LOCATION: Where does it hurt? (upper, mid or lower back)     The lower back pain is all in the lower back.      When I walk I have a shorp stabbing pain in my left hip.   It happens intermittently.    This has been going on for a while now.  That's why I'm having an x ray done. 3. SEVERITY: How bad is the pain?  (e.g., Scale 1-10; mild, moderate, or severe)     It's a sharp, stabbing pain in my left hip when I walk that has been going on for a while.    4. PATTERN: Is the pain constant? (e.g., yes, no; constant, intermittent)      Intermittent in my hip  but becoming more frequent.   My lower back has been hurting since I fell.   5. RADIATION: Does the pain shoot into your legs or somewhere else?     No 6. CAUSE:  What do you think is causing the back pain?      I fell and I think I may have injured my lower back.   I'd like to have it x rayed at the same time as my hip x ray on 10/18/2023. 7. BACK OVERUSE:  Any recent lifting of heavy objects, strenuous work or exercise?     I fell    See above 8. MEDICINES: What have you taken so far for the pain? (e.g., nothing, acetaminophen , NSAIDS)     Not asked 9. NEUROLOGIC SYMPTOMS: Do you have any weakness, numbness, or problems with bowel/bladder control?     No 10. OTHER SYMPTOMS: Do you have  any other symptoms? (e.g., fever, abdomen pain, burning with urination, blood in urine)       No 11. PREGNANCY: Is there any chance you are pregnant? When was your last menstrual period?       N/A due to age  Protocols used: Back Pain-A-AH Dr. Onesimo has ordered an x ray of my left hip to be done on 10/18/2023 there in the office.   I fell Sunday night after I tripped over something on the floor going to the bathroom.    My lower back has been hurting since then.   Would he mind adding an order for an x ray of my lower back so I can get it done at the same time as the left hip x ray?    I broke a bone in my lower back 5 yrs ago and I'm concerned about that since I'm hurting now.   My appt for the hip x ray is for 8/6 at 4:00 there in the office.        FYI Only or Action Required?: Action required by provider: clinical question for provider.  Patient was last seen in primary care on 09/27/2023 by Onesimo Claude, MD.  Called Nurse Triage reporting Back Pain.  Symptoms began several days ago. Sunday night tripped over something on the floor going to the bathroom in the dark and fell.   Her lower back has been hurting since.     Interventions attempted: Nothing.  Symptoms are: unchanged   Having pain in lower back since she fell Sun. Night..  Triage Disposition: Call PCP Now  Patient/caregiver understands and will follow disposition?: Yes

## 2023-10-17 NOTE — Telephone Encounter (Signed)
 Pt had a fall on Sunday and has hurt her back. Pt is scheduled for a hip xray tomorrow ordered by Dr. Onesimo so she would like to know if a lower back xray could be added to make sure she has not broke anything. Pt stated that she did brake a bone in her back about 5 years ago. If okay I will call her to let her know and schedule her for a follow up appt.

## 2023-10-18 ENCOUNTER — Ambulatory Visit (INDEPENDENT_AMBULATORY_CARE_PROVIDER_SITE_OTHER)

## 2023-10-18 ENCOUNTER — Other Ambulatory Visit

## 2023-10-18 DIAGNOSIS — M25552 Pain in left hip: Secondary | ICD-10-CM

## 2023-10-18 DIAGNOSIS — M47816 Spondylosis without myelopathy or radiculopathy, lumbar region: Secondary | ICD-10-CM | POA: Diagnosis not present

## 2023-10-18 DIAGNOSIS — M1612 Unilateral primary osteoarthritis, left hip: Secondary | ICD-10-CM | POA: Diagnosis not present

## 2023-10-18 NOTE — Telephone Encounter (Signed)
 Spoke to Patient regarding Dr. Yates recommmendations for her low back pain. Patient is scheduled to see Chelsea Aurora on 10/19/23.   Patient asked about vestibular PT but I do not see an order for it.

## 2023-10-18 NOTE — Telephone Encounter (Signed)
 Left message for the Patient to call the office back regarding Dr. Yates message.

## 2023-10-19 ENCOUNTER — Ambulatory Visit (INDEPENDENT_AMBULATORY_CARE_PROVIDER_SITE_OTHER): Admitting: Nurse Practitioner

## 2023-10-19 ENCOUNTER — Encounter: Payer: Self-pay | Admitting: Nurse Practitioner

## 2023-10-19 ENCOUNTER — Other Ambulatory Visit: Payer: Self-pay | Admitting: Internal Medicine

## 2023-10-19 VITALS — BP 126/82 | HR 93 | Temp 98.3°F | Ht 65.0 in | Wt 174.0 lb

## 2023-10-19 DIAGNOSIS — W19XXXA Unspecified fall, initial encounter: Secondary | ICD-10-CM | POA: Diagnosis not present

## 2023-10-19 DIAGNOSIS — H8112 Benign paroxysmal vertigo, left ear: Secondary | ICD-10-CM

## 2023-10-19 DIAGNOSIS — M545 Low back pain, unspecified: Secondary | ICD-10-CM | POA: Diagnosis not present

## 2023-10-19 DIAGNOSIS — M25552 Pain in left hip: Secondary | ICD-10-CM | POA: Diagnosis not present

## 2023-10-19 NOTE — Progress Notes (Addendum)
 Established Patient Office Visit  Subjective:  Patient ID: Sherri Lee, female    DOB: January 06, 1956  Age: 68 y.o. MRN: [id number]  CC:  Chief Complaint  Patient presents with   Acute Visit    Clemens over the weekend and is now having back & bum pain  Pain 5/10 Bruised all across both sides of her bum   Discussed the AI scribe software for clinical note transcription with the patient, who gave verbal consent to proceed.  HPI  Sherri Lee is a 68 year old female who presents with pain and bruising following a fall.  She fell over the weekend after tripping over items outside her closet in the dark while heading to the bathroom. She has multiple bruises on her arms and lower back, with significant pain in the lower back, especially on the left side where there are two large bruises.   She was seen in the office on 09/27/23 for left hip pain and dizziness. Her left hip pain is worsening since the fall. It is a stabbing sensation that occurs with larger steps or leg stretches, causing her to take smaller steps. She had a hip x-ray performed the day before the visit due to these ongoing issues. She recalls a past injury to her left leg from cheerleading and dancing.  She has experienced vertigo for the past few months, beginning after an illness. Have improved some. She was referred for vestibular rehab therapy and have to schedule an appointment.    HPI   Past Medical History:  Diagnosis Date   Anemia    Low iron and hemoglobin   Anginal pain (HCC)    states she thinks it is stress   Anxiety    Asthma    COVID-19    07/05/20, 03/2021   Depression    Dysrhythmia    states she has PVC's   Fibromyalgia    Hypertension    Recurrent vomiting 02/04/2018   Schatzki's ring    dialated 3 times by Geryl     Past Surgical History:  Procedure Laterality Date   ABDOMINAL ADHESION SURGERY     BALLOON DILATION  11/15/2022   Procedure: BALLOON DILATION;   Surgeon: Jinny Carmine, MD;  Location: [facility] ENDOSCOPY;  Service: Endoscopy;;   BUNIONECTOMY     left foot    CERVICAL FUSION     CESAREAN SECTION     CHOLECYSTECTOMY  June 2013   COLONOSCOPY WITH PROPOFOL  N/A 11/15/2022   Procedure: COLONOSCOPY WITH PROPOFOL ;  Surgeon: Jinny Carmine, MD;  Location: [facility] ENDOSCOPY;  Service: Endoscopy;  Laterality: N/A;   ESOPHAGOGASTRODUODENOSCOPY (EGD) WITH PROPOFOL  N/A 11/15/2022   Procedure: ESOPHAGOGASTRODUODENOSCOPY (EGD) WITH PROPOFOL ;  Surgeon: Jinny Carmine, MD;  Location: [facility] ENDOSCOPY;  Service: Endoscopy;  Laterality: N/A;   GASTRIC BYPASS      Family History  Problem Relation Age of Onset   Hypertension Mother    Glaucoma Mother    Heart disease Father    Stroke Father    Breast cancer Maternal Aunt    Cancer Maternal Aunt    Colon cancer Neg Hx    Ovarian cancer Neg Hx     Social History   Socioeconomic History   Marital status: Divorced    Spouse name: Not on file   Number of children: Not on file   Years of education: Not on file   Highest education level: Some college, no degree  Occupational History   Not on file  Tobacco Use  Smoking status: Never   Smokeless tobacco: Never  Vaping Use   Vaping status: Never Used  Substance and Sexual Activity   Alcohol use: Yes    Alcohol/week: 3.0 standard drinks of alcohol    Types: 3 Glasses of wine per week   Drug use: No   Sexual activity: Yes  Other Topics Concern   Not on file  Social History Narrative   Lives with spouse, son, and stepson who has serious psychiatric issues and alcohol abuse   Social Drivers of Corporate investment banker Strain: Low Risk  (09/27/2023)   Overall Financial Resource Strain (CARDIA)    Difficulty of Paying Living Expenses: Not very hard  Food Insecurity: No Food Insecurity (09/27/2023)   Hunger Vital Sign    Worried About Running Out of Food in the Last Year: Never true    Ran Out of Food in the Last Year: Never true  Transportation Needs:  No Transportation Needs (09/27/2023)   PRAPARE - Administrator, Civil Service (Medical): No    Lack of Transportation (Non-Medical): No  Physical Activity: Insufficiently Active (09/27/2023)   Exercise Vital Sign    Days of Exercise per Week: 2 days    Minutes of Exercise per Session: 20 min  Stress: Stress Concern Present (09/27/2023)   [employer] of Occupational Health - Occupational Stress Questionnaire    Feeling of Stress: Rather much  Social Connections: Moderately Isolated (09/27/2023)   Social Connection and Isolation Panel    Frequency of Communication with Friends and Family: More than three times a week    Frequency of Social Gatherings with Friends and Family: More than three times a week    Attends Religious Services: 1 to 4 times per year    Active Member of [employer] or Organizations: No    Attends Banker Meetings: Not on file    Marital Status: Divorced  Intimate Partner Violence: Not At Risk (05/01/2023)   Humiliation, Afraid, Rape, and Kick questionnaire    Fear of Current or Ex-Partner: No    Emotionally Abused: No    Physically Abused: No    Sexually Abused: No     Outpatient Medications Prior to Visit  Medication Sig Dispense Refill   acyclovir  (ZOVIRAX ) 400 MG tablet Take 1 tablet (400 mg total) by mouth 5 (five) times daily. 35 tablet 3   albuterol  (VENTOLIN  HFA) 108 (90 Base) MCG/ACT inhaler Inhale 2 puffs into the lungs every 6 (six) hours as needed for wheezing or shortness of breath. 8 g 2   amLODipine  (NORVASC ) 5 MG tablet TAKE 1 TABLET (5 MG TOTAL) BY MOUTH DAILY. 90 tablet 1   atorvastatin  (LIPITOR) 20 MG tablet Take 1 tablet (20 mg total) by mouth daily. 90 tablet 3   azelastine  (ASTELIN ) 0.1 % nasal spray Place 1 spray into both nostrils 2 (two) times daily. Use in each nostril as directed 30 mL 12   calcium  carbonate (OS-CAL) 600 MG TABS Take 600 mg by mouth 2 (two) times daily with a meal.     carbamide peroxide (DEBROX)  6.5 % OTIC solution Place 5 drops into the left ear 2 (two) times daily. 15 mL 0   cyclobenzaprine  (FLEXERIL ) 5 MG tablet TAKE 1 TABLET BY MOUTH EVERYDAY AT BEDTIME 90 tablet 1   diclofenac  Sodium (VOLTAREN ) 1 % GEL Apply 2 g topically 4 (four) times daily. 100 g G   escitalopram (LEXAPRO) 20 MG tablet Take 20 mg by mouth daily.  estradiol  (ESTRACE ) 0.1 MG/GM vaginal cream Place 1 Applicatorful vaginally 3 (three) times a week. 42.5 g 11   fluticasone  (FLONASE ) 50 MCG/ACT nasal spray Place 2 sprays into both nostrils daily as needed for allergies or rhinitis. After nasal saline 48 mL 5   furosemide  (LASIX ) 20 MG tablet TAKE 1 TABLET (20 MG TOTAL) BY MOUTH AS NEEDED FOR EDEMA. 20 tablet 1   gabapentin  (NEURONTIN ) 100 MG capsule TAKE 1 CAPSULE (100 MG TOTAL) BY MOUTH 3 TIMES A DAY 270 capsule 3   latanoprost (XALATAN) 0.005 % ophthalmic solution 1 drop at bedtime.     LORazepam  (ATIVAN ) 1 MG tablet TAKE 1 TABLET BY MOUTH TWICE A DAY AS DIRECTED FOR ANXIETY/PANIC  2   metoprolol  succinate (TOPROL -XL) 50 MG 24 hr tablet TAKE 0.5 TABLETS (25 MG TOTAL) BY MOUTH DAILY. TAKE WITH OR IMMEDIATELY FOLLOWING A MEAL. 45 tablet 3   Multiple Vitamin (MULTIVITAMIN) tablet Take 1 tablet by mouth daily. Bariatric Vitamin     oxybutynin  (DITROPAN -XL) 10 MG 24 hr tablet TAKE 1 TABLET BY MOUTH EVERYDAY AT BEDTIME 90 tablet 1   pantoprazole  (PROTONIX ) 40 MG tablet TAKE 1 TABLET BY MOUTH EVERY DAY 90 tablet 1   potassium chloride  SA (KLOR-CON  M) 20 MEQ tablet Take 1 tablet (20 mEq total) by mouth daily. 90 tablet 1   saccharomyces boulardii (FLORASTOR) 250 MG capsule Take 1 capsule (250 mg total) by mouth daily. 90 capsule 0   traMADol  (ULTRAM ) 50 MG tablet TAKE 1 TABLET BY MOUTH EVERY 12 HOURS AS NEEDED. 60 tablet 2   VITAMIN D  PO Take by mouth.     zolpidem (AMBIEN) 10 MG tablet Take 10 mg by mouth at bedtime as needed for sleep.     predniSONE  (DELTASONE ) 10 MG tablet 6 tablets on Day 1 , then reduce by 1 tablet  daily until gone 21 tablet 0   No facility-administered medications prior to visit.    Allergies  Allergen Reactions   Bacitracin-Polymyxin B Rash   Egg-Derived Products    Povidone Iodine Rash and Other (See Comments)    ROS Review of Systems Negative unless indicated in HPI.    Objective:    Physical Exam Constitutional:      Appearance: Normal appearance.  Eyes:     Extraocular Movements: Extraocular movements intact.     Pupils: Pupils are equal, round, and reactive to light.  Cardiovascular:     Rate and Rhythm: Normal rate and regular rhythm.     Pulses: Normal pulses.     Heart sounds: Normal heart sounds.  Musculoskeletal:        General: Tenderness (left hip) present.  Skin:    Findings: Bruising (left and right upper hip , left arm) present.  Neurological:     General: No focal deficit present.     Mental Status: She is alert and oriented to person, place, and time.  Psychiatric:        Mood and Affect: Mood normal.        Behavior: Behavior normal.      BP 126/82   Pulse 93   Temp 98.3 F (36.8 C)   Ht 5' 5 (1.651 m)   Wt 174 lb (78.9 kg)   SpO2 99%   BMI 28.96 kg/m  Wt Readings from Last 3 Encounters:  10/31/23 176 lb 3.2 oz (79.9 kg)  10/19/23 174 lb (78.9 kg)  10/11/23 178 lb 6.4 oz (80.9 kg)     Health Maintenance  Topic  Date Due   Diabetic kidney evaluation - Urine ACR  Never done   MAMMOGRAM  02/25/2017   DEXA SCAN  Never done   OPHTHALMOLOGY EXAM  03/14/2022   DTaP/Tdap/Td (3 - Td or Tdap) 07/13/2022   COVID-19 Vaccine (5 - 2024-25 season) 11/13/2022   INFLUENZA VACCINE  06/11/2024 (Originally 10/13/2023)   HEMOGLOBIN A1C  12/03/2023   Medicare Annual Wellness (AWV)  04/30/2024   Diabetic kidney evaluation - eGFR measurement  06/01/2024   Colonoscopy  11/14/2032   Pneumococcal Vaccine: 50+ Years  Completed   Hepatitis C Screening  Completed   Zoster Vaccines- Shingrix  Completed   HPV VACCINES  Aged Out   Meningococcal B  Vaccine  Aged Out   Hepatitis B Vaccines 19-59 Average Risk  Discontinued    There are no preventive care reminders to display for this patient.   Lab Results  Component Value Date   TSH 0.86 05/09/2023   Lab Results  Component Value Date   WBC 5.6 05/09/2023   HGB 11.9 (L) 05/09/2023   HCT 36.0 05/09/2023   MCV 87.6 05/09/2023   PLT 234.0 05/09/2023   Lab Results  Component Value Date   NA 140 06/02/2023   K 3.6 06/02/2023   CO2 25 06/02/2023   GLUCOSE 115 (H) 06/02/2023   BUN 8 06/02/2023   CREATININE 0.85 06/02/2023   BILITOT 0.5 05/09/2023   ALKPHOS 84 05/09/2023   AST 32 05/09/2023   ALT 27 05/09/2023   PROT 6.5 05/09/2023   ALBUMIN 3.5 05/09/2023   CALCIUM  9.0 06/02/2023   ANIONGAP 8 01/29/2021   EGFR 75 06/02/2023   GFR 91.18 05/09/2023   Lab Results  Component Value Date   CHOL 133 06/02/2023   Lab Results  Component Value Date   HDL 64 06/02/2023   Lab Results  Component Value Date   LDLCALC 54 06/02/2023   Lab Results  Component Value Date   TRIG 66 06/02/2023   Lab Results  Component Value Date   CHOLHDL 2.1 06/02/2023   Lab Results  Component Value Date   HGBA1C 5.9 (H) 06/02/2023      Assessment & Plan:  Left hip pain Assessment & Plan: She complaint of increased left hip pain since fall a week ago. She had hip pain for couple of months and was seen in the office on 09/27/23. He had Xray yesterday pending x ray results. Pain extends into the left groin. -Awaiting x ray results. -If pain does not improves will refer to ortho for further evaluation.     Acute midline low back pain without sciatica Assessment & Plan:  CT of lumbar spine 2020 shows degenerative disc disease. Tenderness to lower back on palpation. - She is taking tramadol  for pain control. - Advised patient that she can take Tylenol  arthritis to control pain and alternate warm and cold compresses. - Will perform x ray of the back if pain does not improves in 2-3  weeks.    Fall, initial encounter Assessment & Plan: Patient reports fall a week ago after tripping over items in her bedroom while using the bathroom due to broken closet. -She has multiple bruises-on both arms and the hip.   -Patient does not recall the details of the fall. Denise any head injury or headache. -Advised patient to keep the area clear to prevent further falls     Follow-up: Return if symptoms worsen or fail to improve.   Amana Bouska, NP

## 2023-10-19 NOTE — Telephone Encounter (Signed)
 Noted

## 2023-10-23 ENCOUNTER — Ambulatory Visit: Payer: Self-pay | Admitting: Internal Medicine

## 2023-10-23 NOTE — Progress Notes (Signed)
 Called reading room and requested xray results.

## 2023-10-25 ENCOUNTER — Telehealth: Payer: Self-pay | Admitting: Internal Medicine

## 2023-10-25 ENCOUNTER — Ambulatory Visit: Payer: Self-pay

## 2023-10-25 NOTE — Telephone Encounter (Signed)
 Please contact patient if provider would like her to try any other medications and to advise which appointment should be cancelled.   Reason for Disposition  [1] Follow-up call to recent contact AND [2] information only call, no triage required  Answer Assessment - Initial Assessment Questions 1. REASON FOR CALL: What is the main reason for your call? or How can I best help you?     Patient called to reschedule appt that was previously scheduled by triage. PAS had scheduled pt for the 11/10/23. This RN scheduled her for 10/27/23 due to severity of discomfort.   ED precautions reviewed, pt verbalized understanding. Provided pt with closest UC location and hours in case she decides she needs to be seen sooner or after hours.  Protocols used: Information Only Call - No Triage-A-AH Copied from CRM [id number]. Topic: Clinical - Red Word Triage >> Oct 25, 2023  4:44 PM Gennette ORN wrote: Red Word that prompted transfer to Nurse Triage: Patient is still having severe back pain and hip pain when she walks. Patient had appointment for tomorrow at 10/26/23 but will not be able to make it due to her mother appointment as well.

## 2023-10-25 NOTE — Telephone Encounter (Unsigned)
 Copied from CRM [id number]. Topic: Referral - Question >> Oct 25, 2023  4:29 PM Gennette ORN wrote: Reason for CRM: Patient is calling because she wants to know can she be referred to an orthopedic doctor as well.

## 2023-10-26 ENCOUNTER — Ambulatory Visit: Admitting: Nurse Practitioner

## 2023-10-26 NOTE — Telephone Encounter (Signed)
 FYI for you.

## 2023-10-26 NOTE — Telephone Encounter (Signed)
 Scheduled to see Leron Glance, NP tomorrow 11 am.

## 2023-10-27 ENCOUNTER — Ambulatory Visit: Admitting: Nurse Practitioner

## 2023-10-27 NOTE — Telephone Encounter (Signed)
 Left a message and sent MyChart message that patient's appointment has been changed to 11:20 today with Vincente.

## 2023-10-31 ENCOUNTER — Encounter: Payer: Self-pay | Admitting: Nurse Practitioner

## 2023-10-31 ENCOUNTER — Ambulatory Visit: Admitting: Nurse Practitioner

## 2023-10-31 VITALS — BP 128/84 | HR 97 | Temp 98.0°F | Ht 65.0 in | Wt 176.2 lb

## 2023-10-31 DIAGNOSIS — R42 Dizziness and giddiness: Secondary | ICD-10-CM

## 2023-10-31 DIAGNOSIS — W19XXXD Unspecified fall, subsequent encounter: Secondary | ICD-10-CM

## 2023-10-31 DIAGNOSIS — M25552 Pain in left hip: Secondary | ICD-10-CM | POA: Diagnosis not present

## 2023-10-31 DIAGNOSIS — W19XXXA Unspecified fall, initial encounter: Secondary | ICD-10-CM

## 2023-10-31 NOTE — Progress Notes (Signed)
 Established Patient Office Visit  Subjective:  Patient ID: Sherri Lee, female    DOB: 1955/10/30  Age: 68 y.o. MRN: [id number]  CC:  Chief Complaint  Patient presents with   Back Pain    Low back pain achy pain 6/10 Left hip pain 7-8/10 Patient stated she has fell several times over the past couple of weeks   Discussed the use of a AI scribe software for clinical note transcription with the patient, who gave verbal consent to proceed.  HPI  Sherri Lee is a 68 year old female with mild arthritis of the left hip who presents with hip pain and recent falls.  She experiences shooting pain in the left groin area that radiates upwards, particularly when walking. The pain is intermittent and can be severe enough to impede her ability to walk.   X ray left hip 10/18/23  IMPRESSION: 1. Mild osteoarthritis of the left hip. 2. Mild osteoarthritis of the right sacroiliac joint. 3. Lower lumbar degenerative changes, not primarily evaluated.   She has experienced multiple falls, including two recent incidents where she tripped over items in her bedroom. During one fall, she hit her head, resulting in a bruise on the left forehead, and sustained scratches on both elbows and knees. She attributes the falls due to her belongings around her room because her closet collapsed and possibly to intermittent dizziness.  She describes episodes of vertigo that have improved over time but still occur occasionally. The vertigo began after an illness last winter and is described as a sensation of the room spinning, leading to disorientation. She takes cyclobenzaprine  and Ambien at night, which she believes may contribute to her grogginess and falls.  She has pain in her lower back and takes ibuprofen, Tylenol , and tramadol  twice daily for pain management.  Denise change in vision or headache. HPI   Past Medical History:  Diagnosis Date   Anemia    Low iron and hemoglobin    Anginal pain (HCC)    states she thinks it is stress   Anxiety    Asthma    COVID-19    07/05/20, 03/2021   Depression    Dysrhythmia    states she has PVC's   Fibromyalgia    Hypertension    Recurrent vomiting 02/04/2018   Schatzki's ring    dialated 3 times by Geryl     Past Surgical History:  Procedure Laterality Date   ABDOMINAL ADHESION SURGERY     BALLOON DILATION  11/15/2022   Procedure: BALLOON DILATION;  Surgeon: Jinny Carmine, MD;  Location: [facility] ENDOSCOPY;  Service: Endoscopy;;   BUNIONECTOMY     left foot    CERVICAL FUSION     CESAREAN SECTION     CHOLECYSTECTOMY  June 2013   COLONOSCOPY WITH PROPOFOL  N/A 11/15/2022   Procedure: COLONOSCOPY WITH PROPOFOL ;  Surgeon: Jinny Carmine, MD;  Location: [facility] ENDOSCOPY;  Service: Endoscopy;  Laterality: N/A;   ESOPHAGOGASTRODUODENOSCOPY (EGD) WITH PROPOFOL  N/A 11/15/2022   Procedure: ESOPHAGOGASTRODUODENOSCOPY (EGD) WITH PROPOFOL ;  Surgeon: Jinny Carmine, MD;  Location: [facility] ENDOSCOPY;  Service: Endoscopy;  Laterality: N/A;   GASTRIC BYPASS      Family History  Problem Relation Age of Onset   Hypertension Mother    Glaucoma Mother    Heart disease Father    Stroke Father    Breast cancer Maternal Aunt    Cancer Maternal Aunt    Colon cancer Neg Hx    Ovarian cancer Neg Hx  Social History   Socioeconomic History   Marital status: Divorced    Spouse name: Not on file   Number of children: Not on file   Years of education: Not on file   Highest education level: Some college, no degree  Occupational History   Not on file  Tobacco Use   Smoking status: Never   Smokeless tobacco: Never  Vaping Use   Vaping status: Never Used  Substance and Sexual Activity   Alcohol use: Yes    Alcohol/week: 3.0 standard drinks of alcohol    Types: 3 Glasses of wine per week   Drug use: No   Sexual activity: Yes  Other Topics Concern   Not on file  Social History Narrative   Lives with spouse, son, and stepson who has serious  psychiatric issues and alcohol abuse   Social Drivers of Corporate investment banker Strain: Low Risk  (09/27/2023)   Overall Financial Resource Strain (CARDIA)    Difficulty of Paying Living Expenses: Not very hard  Food Insecurity: No Food Insecurity (09/27/2023)   Hunger Vital Sign    Worried About Running Out of Food in the Last Year: Never true    Ran Out of Food in the Last Year: Never true  Transportation Needs: No Transportation Needs (09/27/2023)   PRAPARE - Administrator, Civil Service (Medical): No    Lack of Transportation (Non-Medical): No  Physical Activity: Insufficiently Active (09/27/2023)   Exercise Vital Sign    Days of Exercise per Week: 2 days    Minutes of Exercise per Session: 20 min  Stress: Stress Concern Present (09/27/2023)   [employer] of Occupational Health - Occupational Stress Questionnaire    Feeling of Stress: Rather much  Social Connections: Moderately Isolated (09/27/2023)   Social Connection and Isolation Panel    Frequency of Communication with Friends and Family: More than three times a week    Frequency of Social Gatherings with Friends and Family: More than three times a week    Attends Religious Services: 1 to 4 times per year    Active Member of [employer] or Organizations: No    Attends Banker Meetings: Not on file    Marital Status: Divorced  Intimate Partner Violence: Not At Risk (05/01/2023)   Humiliation, Afraid, Rape, and Kick questionnaire    Fear of Current or Ex-Partner: No    Emotionally Abused: No    Physically Abused: No    Sexually Abused: No     Outpatient Medications Prior to Visit  Medication Sig Dispense Refill   acyclovir  (ZOVIRAX ) 400 MG tablet Take 1 tablet (400 mg total) by mouth 5 (five) times daily. 35 tablet 3   albuterol  (VENTOLIN  HFA) 108 (90 Base) MCG/ACT inhaler Inhale 2 puffs into the lungs every 6 (six) hours as needed for wheezing or shortness of breath. 8 g 2   amLODipine   (NORVASC ) 5 MG tablet TAKE 1 TABLET (5 MG TOTAL) BY MOUTH DAILY. 90 tablet 1   atorvastatin  (LIPITOR) 20 MG tablet Take 1 tablet (20 mg total) by mouth daily. 90 tablet 3   azelastine  (ASTELIN ) 0.1 % nasal spray Place 1 spray into both nostrils 2 (two) times daily. Use in each nostril as directed 30 mL 12   calcium  carbonate (OS-CAL) 600 MG TABS Take 600 mg by mouth 2 (two) times daily with a meal.     carbamide peroxide (DEBROX) 6.5 % OTIC solution Place 5 drops into the left ear 2 (  two) times daily. 15 mL 0   cyclobenzaprine  (FLEXERIL ) 5 MG tablet TAKE 1 TABLET BY MOUTH EVERYDAY AT BEDTIME 90 tablet 1   diclofenac  Sodium (VOLTAREN ) 1 % GEL Apply 2 g topically 4 (four) times daily. 100 g G   escitalopram (LEXAPRO) 20 MG tablet Take 20 mg by mouth daily.      estradiol  (ESTRACE ) 0.1 MG/GM vaginal cream Place 1 Applicatorful vaginally 3 (three) times a week. 42.5 g 11   fluticasone  (FLONASE ) 50 MCG/ACT nasal spray Place 2 sprays into both nostrils daily as needed for allergies or rhinitis. After nasal saline 48 mL 5   furosemide  (LASIX ) 20 MG tablet TAKE 1 TABLET (20 MG TOTAL) BY MOUTH AS NEEDED FOR EDEMA. 20 tablet 1   gabapentin  (NEURONTIN ) 100 MG capsule TAKE 1 CAPSULE (100 MG TOTAL) BY MOUTH 3 TIMES A DAY 270 capsule 3   latanoprost (XALATAN) 0.005 % ophthalmic solution 1 drop at bedtime.     LORazepam  (ATIVAN ) 1 MG tablet TAKE 1 TABLET BY MOUTH TWICE A DAY AS DIRECTED FOR ANXIETY/PANIC  2   metoprolol  succinate (TOPROL -XL) 50 MG 24 hr tablet TAKE 0.5 TABLETS (25 MG TOTAL) BY MOUTH DAILY. TAKE WITH OR IMMEDIATELY FOLLOWING A MEAL. 45 tablet 3   Multiple Vitamin (MULTIVITAMIN) tablet Take 1 tablet by mouth daily. Bariatric Vitamin     oxybutynin  (DITROPAN -XL) 10 MG 24 hr tablet TAKE 1 TABLET BY MOUTH EVERYDAY AT BEDTIME 90 tablet 1   pantoprazole  (PROTONIX ) 40 MG tablet TAKE 1 TABLET BY MOUTH EVERY DAY 90 tablet 1   potassium chloride  SA (KLOR-CON  M) 20 MEQ tablet Take 1 tablet (20 mEq total) by  mouth daily. 90 tablet 1   saccharomyces boulardii (FLORASTOR) 250 MG capsule Take 1 capsule (250 mg total) by mouth daily. 90 capsule 0   traMADol  (ULTRAM ) 50 MG tablet TAKE 1 TABLET BY MOUTH EVERY 12 HOURS AS NEEDED. 60 tablet 2   VITAMIN D  PO Take by mouth.     zolpidem (AMBIEN) 10 MG tablet Take 10 mg by mouth at bedtime as needed for sleep.     No facility-administered medications prior to visit.    Allergies  Allergen Reactions   Bacitracin-Polymyxin B Rash   Egg-Derived Products    Povidone Iodine Rash and Other (See Comments)    ROS Review of Systems Negative unless indicated in HPI.    Objective:    Physical Exam Constitutional:      Appearance: Normal appearance.  Cardiovascular:     Rate and Rhythm: Normal rate and regular rhythm.     Pulses: Normal pulses.     Heart sounds: Normal heart sounds.  Musculoskeletal:        General: Tenderness (left hip pain) and signs of injury (bilaterall elbow) present.  Skin:    Findings: Bruising (left forehead) present.  Neurological:     General: No focal deficit present.     Mental Status: She is alert and oriented to person, place, and time.  Psychiatric:        Mood and Affect: Mood normal.        Behavior: Behavior normal.     BP 128/84   Pulse 97   Temp 98 F (36.7 C)   Ht 5' 5 (1.651 m)   Wt 176 lb 3.2 oz (79.9 kg)   SpO2 97%   BMI 29.32 kg/m  Wt Readings from Last 3 Encounters:  10/31/23 176 lb 3.2 oz (79.9 kg)  10/19/23 174 lb (78.9 kg)  10/11/23 178 lb 6.4 oz (80.9 kg)     Health Maintenance  Topic Date Due   Diabetic kidney evaluation - Urine ACR  Never done   MAMMOGRAM  02/25/2017   DEXA SCAN  Never done   OPHTHALMOLOGY EXAM  03/14/2022   DTaP/Tdap/Td (3 - Td or Tdap) 07/13/2022   COVID-19 Vaccine (5 - 2024-25 season) 11/13/2022   INFLUENZA VACCINE  06/11/2024 (Originally 10/13/2023)   HEMOGLOBIN A1C  12/03/2023   Medicare Annual Wellness (AWV)  04/30/2024   Diabetic kidney evaluation -  eGFR measurement  06/01/2024   Colonoscopy  11/14/2032   Pneumococcal Vaccine: 50+ Years  Completed   Hepatitis C Screening  Completed   Zoster Vaccines- Shingrix  Completed   HPV VACCINES  Aged Out   Meningococcal B Vaccine  Aged Out   Hepatitis B Vaccines 19-59 Average Risk  Discontinued    There are no preventive care reminders to display for this patient.  Lab Results  Component Value Date   TSH 0.86 05/09/2023   Lab Results  Component Value Date   WBC 5.6 05/09/2023   HGB 11.9 (L) 05/09/2023   HCT 36.0 05/09/2023   MCV 87.6 05/09/2023   PLT 234.0 05/09/2023   Lab Results  Component Value Date   NA 140 06/02/2023   K 3.6 06/02/2023   CO2 25 06/02/2023   GLUCOSE 115 (H) 06/02/2023   BUN 8 06/02/2023   CREATININE 0.85 06/02/2023   BILITOT 0.5 05/09/2023   ALKPHOS 84 05/09/2023   AST 32 05/09/2023   ALT 27 05/09/2023   PROT 6.5 05/09/2023   ALBUMIN 3.5 05/09/2023   CALCIUM  9.0 06/02/2023   ANIONGAP 8 01/29/2021   EGFR 75 06/02/2023   GFR 91.18 05/09/2023   Lab Results  Component Value Date   CHOL 133 06/02/2023   Lab Results  Component Value Date   HDL 64 06/02/2023   Lab Results  Component Value Date   LDLCALC 54 06/02/2023   Lab Results  Component Value Date   TRIG 66 06/02/2023   Lab Results  Component Value Date   CHOLHDL 2.1 06/02/2023   Lab Results  Component Value Date   HGBA1C 5.9 (H) 06/02/2023      Assessment & Plan:  Fall, subsequent encounter Assessment & Plan: Patient had recurrent falls in the past few weeks.  She had recent fall with left forhead head contusion and abrasion on both elbows. Patient is taking both Ambien and Ativan  for anxiety and insomnia by her psychiatrist.  Cautioned patient regarding adjectives sedatives effect, risk of fall and impaired coordination. - Order CT scan of the head. - Recommend warm compress for pain relief. - Ensure home environment is free of tripping hazards.  Orders: -     CT HEAD WO  CONTRAST ( ); Future  Left hip pain Assessment & Plan: Pt has left hip pain that radiates to the grain area and impaired ambulation. -The x ray shows mild left hip arthritis . - Refer to orthopedics for further evaluation and management. - Continue tramadol  and tylenol  arthritis for pain management.   Orders: -     Ambulatory referral to Orthopedics  Vertigo Assessment & Plan: Intermittent vertigo with room spinning sensation, improved but persistent.  -She is schedule to see PT on 11/08/23.       Follow-up: No follow-ups on file.   Chevonne Bostrom, NP

## 2023-10-31 NOTE — Patient Instructions (Addendum)
 Please call to schedule appointment for therapy

## 2023-11-05 DIAGNOSIS — M545 Low back pain, unspecified: Secondary | ICD-10-CM | POA: Insufficient documentation

## 2023-11-05 DIAGNOSIS — W19XXXA Unspecified fall, initial encounter: Secondary | ICD-10-CM | POA: Insufficient documentation

## 2023-11-05 NOTE — Assessment & Plan Note (Signed)
 Patient reports fall a week ago after tripping over items in her bedroom while using the bathroom due to broken closet. -She has multiple bruises-on both arms and the hip.   -Patient does not recall the details of the fall. Denise any head injury or headache. -Advised patient to keep the area clear to prevent further falls

## 2023-11-05 NOTE — Assessment & Plan Note (Signed)
 She complaint of increased left hip pain since fall a week ago. She had hip pain for couple of months and was seen in the office on 09/27/23. He had Xray yesterday pending x ray results. Pain extends into the left groin. -Awaiting x ray results. -If pain does not improves will refer to ortho for further evaluation.

## 2023-11-05 NOTE — Assessment & Plan Note (Addendum)
 CT of lumbar spine 2020 shows degenerative disc disease. Tenderness to lower back on palpation. - She is taking tramadol  for pain control. - Advised patient that she can take Tylenol  arthritis to control pain and alternate warm and cold compresses. - Will perform x ray of the back if pain does not improves in 2-3 weeks.

## 2023-11-05 NOTE — Assessment & Plan Note (Addendum)
 Pt has left hip pain that radiates to the grain area and impaired ambulation. -The x ray shows mild left hip arthritis . - Refer to orthopedics for further evaluation and management. - Continue tramadol  and tylenol  arthritis for pain management.

## 2023-11-05 NOTE — Assessment & Plan Note (Signed)
 Patient had recurrent falls in the past few weeks.  She had recent fall with left forhead head contusion and abrasion on both elbows. Patient is taking both Ambien and Ativan  for anxiety and insomnia by her psychiatrist.  Cautioned patient regarding adjectives sedatives effect, risk of fall and impaired coordination. - Order CT scan of the head. - Recommend warm compress for pain relief. - Ensure home environment is free of tripping hazards.

## 2023-11-05 NOTE — Assessment & Plan Note (Signed)
 Intermittent vertigo with room spinning sensation, improved but persistent.  -She is schedule to see PT on 11/08/23.

## 2023-11-08 ENCOUNTER — Ambulatory Visit: Attending: Internal Medicine

## 2023-11-08 DIAGNOSIS — R42 Dizziness and giddiness: Secondary | ICD-10-CM | POA: Diagnosis not present

## 2023-11-08 DIAGNOSIS — H8112 Benign paroxysmal vertigo, left ear: Secondary | ICD-10-CM | POA: Diagnosis not present

## 2023-11-08 DIAGNOSIS — R296 Repeated falls: Secondary | ICD-10-CM | POA: Diagnosis not present

## 2023-11-08 DIAGNOSIS — R262 Difficulty in walking, not elsewhere classified: Secondary | ICD-10-CM | POA: Diagnosis not present

## 2023-11-09 ENCOUNTER — Ambulatory Visit
Admission: RE | Admit: 2023-11-09 | Discharge: 2023-11-09 | Disposition: A | Discharge status: Home / Self Care | Source: Ambulatory Visit | Attending: Nurse Practitioner | Admitting: Nurse Practitioner

## 2023-11-09 DIAGNOSIS — R296 Repeated falls: Secondary | ICD-10-CM | POA: Insufficient documentation

## 2023-11-09 DIAGNOSIS — G319 Degenerative disease of nervous system, unspecified: Secondary | ICD-10-CM | POA: Insufficient documentation

## 2023-11-09 DIAGNOSIS — W19XXXD Unspecified fall, subsequent encounter: Secondary | ICD-10-CM | POA: Insufficient documentation

## 2023-11-09 DIAGNOSIS — R42 Dizziness and giddiness: Secondary | ICD-10-CM | POA: Diagnosis not present

## 2023-11-09 NOTE — Therapy (Signed)
 OUTPATIENT PHYSICAL THERAPY EVALUATION  Patient Name: Sherri Lee MRN: [id number] DOB:February 13, 1956, 68 y.o., female Today's Date: 11/09/2023   PCP: Marylynn Verneita CROME, MD REFERRING PROVIDER: Onesimo Claude, MD  END OF SESSION:  PT End of Session - 11/09/23 0850     Visit Number 1    Number of Visits 16    Date for PT Re-Evaluation 01/03/24    Authorization Type [employer] Medicare    Authorization Time Period 11/08/23-01/03/24    Progress Note Due on Visit 10    PT Start Time 1530    PT Stop Time 1610    PT Time Calculation (min) 40 min    Equipment Utilized During Treatment Gait belt    Activity Tolerance Patient tolerated treatment well;No increased pain    Behavior During Therapy [facility] for tasks assessed/performed          Past Medical History:  Diagnosis Date   Anemia    Low iron and hemoglobin   Anginal pain (HCC)    states she thinks it is stress   Anxiety    Asthma    COVID-19    07/05/20, 03/2021   Depression    Dysrhythmia    states she has PVC's   Fibromyalgia    Hypertension    Recurrent vomiting 02/04/2018   Schatzki's ring    dialated 3 times by Geryl    Past Surgical History:  Procedure Laterality Date   ABDOMINAL ADHESION SURGERY     BALLOON DILATION  11/15/2022   Procedure: BALLOON DILATION;  Surgeon: Jinny Carmine, MD;  Location: [facility] ENDOSCOPY;  Service: Endoscopy;;   BUNIONECTOMY     left foot    CERVICAL FUSION     CESAREAN SECTION     CHOLECYSTECTOMY  June 2013   COLONOSCOPY WITH PROPOFOL  N/A 11/15/2022   Procedure: COLONOSCOPY WITH PROPOFOL ;  Surgeon: Jinny Carmine, MD;  Location: [facility] ENDOSCOPY;  Service: Endoscopy;  Laterality: N/A;   ESOPHAGOGASTRODUODENOSCOPY (EGD) WITH PROPOFOL  N/A 11/15/2022   Procedure: ESOPHAGOGASTRODUODENOSCOPY (EGD) WITH PROPOFOL ;  Surgeon: Jinny Carmine, MD;  Location: [facility] ENDOSCOPY;  Service: Endoscopy;  Laterality: N/A;   GASTRIC BYPASS     Patient Active Problem List   Diagnosis Date Noted   Acute  midline low back pain without sciatica 11/05/2023   Fall 11/05/2023   Left hip pain 09/27/2023   BPPV (benign paroxysmal positional vertigo) 09/27/2023   Impacted cerumen, left ear 09/27/2023   Callus of toe 09/27/2023   Vertigo, labyrinthine, unspecified laterality 06/04/2023   Tick bite of right back wall of thorax 05/31/2023   Vertigo 05/31/2023   Antibiotic-associated diarrhea 05/09/2023   Cough with hemoptysis 05/09/2023   Intermittent constipation 04/14/2023   Diarrhea 03/13/2023   Community acquired pneumonia 02/02/2023   Problems with swallowing and mastication 11/15/2022   Stricture of esophagus 11/15/2022   Bronchitis 03/02/2022   Severe depression (HCC) 12/31/2021   Diabetes mellitus without complication (HCC) 12/31/2021   Cervical arthritis 07/30/2021   H/O cervical spine surgery 07/30/2021   Recurrent headache 02/13/2021   Aortic atherosclerosis (HCC) 11/12/2020   Diuretic-induced hypokalemia 09/03/2020   History of COVID-19 08/06/2020   Encounter for screening colonoscopy 07/30/2019   Low back pain associated with a spinal disorder other than radiculopathy or spinal stenosis 10/28/2018   Urge incontinence of urine 08/15/2018   Injury of branch of radial nerve 08/15/2018   Skin lesion of right lower extremity 11/04/2017   Adjustment disorder with mixed disturbance of emotions and conduct 03/31/2017   History of benzodiazepine  use 03/31/2017   Esophageal stricture 06/19/2016   IBS (irritable bowel syndrome) 09/06/2015   IDA (iron deficiency anemia) 09/06/2015   DDD (degenerative disc disease), cervical 09/24/2014   DDD (degenerative disc disease), thoracic 09/24/2014   Tremor of both hands 09/10/2014   Insomnia due to anxiety and fear 02/26/2013   Alopecia areata 07/11/2012   Cervicalgia 07/11/2012   Degenerative joint disease of thoracic spine 10/04/2011   Acute sinusitis 10/04/2011   Essential hypertension 05/10/2011   Prediabetes 05/10/2011   Status post  gastric bypass for obesity 05/10/2011   ONSET DATE: February 2025 REFERRING DIAG: Vertigo  THERAPY DIAG:  Dizziness and giddiness  Difficulty in walking, not elsewhere classified  Repeated falls  Rationale for Evaluation and Treatment: Rehabilitation  SUBJECTIVE:                                                                                                                                                                                             SUBJECTIVE STATEMENT: Pt needs help with vertigo episodes as she has now fallen twice recently.   PERTINENT HISTORY:   68yoF referred to OPPT for vestibular evaluation after persistent episodic vertigo since acute flu infection in February. Pt reports episodes were initially daily occurrence, but less frequent and less severe- always triggered by movement, often by coming to standing too quickly, duration 4 hours or less. Pt also reports chronic trouble with her left ear, recently having ear wax buildup removed. Pt has had 2 falls related to vertigo episodes  in the weeks preceding evaluation here, both occurred while getting up in the middle of night for toiletting. Pt reports hitting her head, no significant injury sustained, also aggravating left hip pain, all imaging thus far reassuring. Pt does report spinning sensation on occasion with symptoms, as well as other dizzy type symptoms, denies any graying/blacking of vision or diplopia. PMH: frequent falls >5 years, s/p L1-L3 transverse fractures, ACDF, COPDm iron deficiency anemia, gastric bypass, diuretic-induced hypokalemia, estrogen deficiency.   PAIN:  Are you having pain? No   PRECAUTIONS: None  WEIGHT BEARING RESTRICTIONS: No   FALLS: Has patient fallen in last 6 months? Yes, x2  LIVING ENVIRONMENT: Lives with: Alone Lives in: Condo  Stairs: None at garage entrance  Has following equipment at home: None   PLOF: fully independent, denies any baseline imbalance.   PATIENT GOALS:  stop vertigo episodes, avoid additional falls   OBJECTIVE:  Note: Objective measures were completed at evaluation unless otherwise noted.  DIAGNOSTIC FINDINGS:  10/23/23: hip unilat/pelvis: unremarkable. *Head CT (scheduled for 11/09/23 15:30)  VISION SCREENING:  -WNL: history of glaucoma; saccades, smooth  pursuits, vergence, all WNL; denies field loss, denies diplopia   ORTHOSTATIC VITAL SIGNS:   122/69 86BPM supine 121/90 86bpm seated  107/54mmHg 96bpm standing (symptomatic with LOB while AMB away from plinth)  123/85mmHg 98bpm standing x1 minute  VESTIBULAR TESTS:   Horizontal roll test: negative bilat   Dix Hallpike: negative right, positive Left                                                                                                                              TREATMENT DATE 11/09/23:  -explanation of orthostatic hypotension, symptoms, safe self management, exacerbating factors -Epley Maneuver for Left sided posterior canal BPPV -what's BPPV and what should I expect following treatment? *tests and treatments were modified to accommodate cervical ROM loss 2/2 ACDF history    PATIENT EDUCATION: Education details: *see above treatment section  Person educated: [name]  Education method: discussion Education comprehension: Good  HOME EXERCISE PROGRAM: None issued this date.  GOALS: Goals reviewed with patient? No   SHORT TERM GOALS: Target date: 11/24/23  Pt to report no incidence of vertigo symptoms in most recent 7 days.  Baseline: Goal status: INITIAL  2.  Pt to reports improved ability to safely manage dizziness symptoms associated with coming to standing too quickly.  Baseline:  Goal status: INITIAL  LONG TERM GOALS: Target date: 01/03/24  Pt to report confident safe self-management of mobility in the setting on chronic orthostatic hypotension.  Baseline:  Goal status: INITIAL  2.  Pt to report no falls or close calls at home in the past 4 weeks.   Baseline: 2 falls in past 2 sweeks.  Goal status: INITIAL  3.  Pt to report no vertigo episodes in past 4 weeks.  Baseline: daily.  Goal status: INITIAL  ASSESSMENT:  CLINICAL IMPRESSION: Pt evaluated for ongoing vertigo episodes x 6 months. Exam revealing of BPPV Left side as well as orthostatic hypotension which I suspect to be chronic given subjective report and comorbidities, specifically gastic bypass, anemia, and dehydration issues. Vitals do not indicate dysautonomic type. Provided treatment today for canalith relocation. Pt educated exensively on BPPV and orthostatic hypotension and how to improve safety in the mean time. Patient will benefit from skilled physical therapy intervention to reduce deficits and impairments identified in evaluation, in order to reduce pain, improve quality of life, and maximize activity tolerance for ADL, IADL, and leisure/fitness. Physical therapy will help pt achieve long and short term goals of care.   OBJECTIVE IMPAIRMENTS: Decreased knowledge of condition, decreased use of DME, decreased mobility, difficulty walking, decreased strength, decreased ROM. ACTIVITY LIMITATIONS: Lifting, standing, walking, squatting, transfers, locomotion level PARTICIPATION LIMITATIONS: Cleaning, laundry, interpersonal relationships, driving, yardwork, community activity.  PERSONAL FACTORS: Age, behavior pattern, education, past/current experiences, transportation, profession  are also affecting patient's functional outcome.  REHAB POTENTIAL: Good CLINICAL DECISION MAKING: Medium  EVALUATION COMPLEXITY: Moderate   PLAN:  PT FREQUENCY: 1-2x/week  PT DURATION: 8 weeks  PLANNED INTERVENTIONS:  97110-Therapeutic exercises, 97530- Therapeutic activity, 97112- Neuromuscular re-education, [phone]- Self Care, 02859- Manual therapy, [phone]- Canalith repositioning, Patient/Family education, Balance training, Stair training, Vestibular training, and Visual/preceptual  remediation/compensation  PLAN FOR NEXT SESSION: Retest Left Trenda Craze; repeat orthostatic vital signs, repeat Epley move as needed, accomodation training, balance HEP  9:27 AM, 11/09/23 Peggye JAYSON Linear, PT, DPT Physical Therapist - [facility]  Outpatient Physical Therapy- Main Campus [phone]      Ilion C, PT 11/09/2023, 8:58 AM

## 2023-11-10 ENCOUNTER — Telehealth: Payer: Self-pay

## 2023-11-10 ENCOUNTER — Ambulatory Visit (INDEPENDENT_AMBULATORY_CARE_PROVIDER_SITE_OTHER): Admitting: Nurse Practitioner

## 2023-11-10 ENCOUNTER — Encounter: Payer: Self-pay | Admitting: Nurse Practitioner

## 2023-11-10 VITALS — BP 118/74 | HR 83 | Temp 98.5°F | Resp 20 | Ht 65.0 in | Wt 176.5 lb

## 2023-11-10 DIAGNOSIS — R42 Dizziness and giddiness: Secondary | ICD-10-CM

## 2023-11-10 DIAGNOSIS — M25552 Pain in left hip: Secondary | ICD-10-CM

## 2023-11-10 NOTE — Progress Notes (Signed)
 Established Patient Office Visit  Subjective:  Patient ID: Sherri Lee, female    DOB: Jul 30, 1955  Age: 68 y.o. MRN: [id number]  CC:  Chief Complaint  Patient presents with   Dizziness    Vertigo and lower back pain.   Discussed the use of a AI scribe software for clinical note transcription with the patient, who gave verbal consent to proceed.  HPI  Sherri Lee presents for follow up.  She experiences increased dizziness following a physical therapy session for vertigo, where a maneuver caused a significant drop in blood pressure, nearly resulting in a fall. Her dizziness worsens with neck movement or position changes, described as a constant feeling of being 'sort of dizzy'. She is cautious about moving due to fear of falling.  She has a titanium plate in her neck from a three-disc fusion. She uses over-the-counter meclizine for dizziness, which is somewhat helpful but causes drowsiness, so she takes it at night. She is cautious with movements, especially when standing or moving her head, to prevent dizziness and falls.  She reports hip pain, which has slightly improved with careful movements after two falls. She has an upcoming appointment with an orthopedic specialist.   HPI   Past Medical History:  Diagnosis Date   Anemia    Low iron and hemoglobin   Anginal pain (HCC)    states she thinks it is stress   Anxiety    Asthma    COVID-19    07/05/20, 03/2021   Depression    Dysrhythmia    states she has PVC's   Fibromyalgia    Hypertension    Recurrent vomiting 02/04/2018   Schatzki's ring    dialated 3 times by Geryl     Past Surgical History:  Procedure Laterality Date   ABDOMINAL ADHESION SURGERY     BALLOON DILATION  11/15/2022   Procedure: BALLOON DILATION;  Surgeon: Jinny Carmine, MD;  Location: [facility] ENDOSCOPY;  Service: Endoscopy;;   BUNIONECTOMY     left foot    CERVICAL FUSION     CESAREAN SECTION     CHOLECYSTECTOMY  June  2013   COLONOSCOPY WITH PROPOFOL  N/A 11/15/2022   Procedure: COLONOSCOPY WITH PROPOFOL ;  Surgeon: Jinny Carmine, MD;  Location: [facility] ENDOSCOPY;  Service: Endoscopy;  Laterality: N/A;   ESOPHAGOGASTRODUODENOSCOPY (EGD) WITH PROPOFOL  N/A 11/15/2022   Procedure: ESOPHAGOGASTRODUODENOSCOPY (EGD) WITH PROPOFOL ;  Surgeon: Jinny Carmine, MD;  Location: [facility] ENDOSCOPY;  Service: Endoscopy;  Laterality: N/A;   GASTRIC BYPASS      Family History  Problem Relation Age of Onset   Hypertension Mother    Glaucoma Mother    Heart disease Father    Stroke Father    Breast cancer Maternal Aunt    Cancer Maternal Aunt    Colon cancer Neg Hx    Ovarian cancer Neg Hx     Social History   Socioeconomic History   Marital status: Divorced    Spouse name: Not on file   Number of children: Not on file   Years of education: Not on file   Highest education level: Some college, no degree  Occupational History   Not on file  Tobacco Use   Smoking status: Never   Smokeless tobacco: Never  Vaping Use   Vaping status: Never Used  Substance and Sexual Activity   Alcohol use: Yes    Alcohol/week: 3.0 standard drinks of alcohol    Types: 3 Glasses of wine per week   Drug use:  No   Sexual activity: Yes  Other Topics Concern   Not on file  Social History Narrative   Lives with spouse, son, and stepson who has serious psychiatric issues and alcohol abuse   Social Drivers of Corporate investment banker Strain: Low Risk  (09/27/2023)   Overall Financial Resource Strain (CARDIA)    Difficulty of Paying Living Expenses: Not very hard  Food Insecurity: No Food Insecurity (09/27/2023)   Hunger Vital Sign    Worried About Running Out of Food in the Last Year: Never true    Ran Out of Food in the Last Year: Never true  Transportation Needs: No Transportation Needs (09/27/2023)   PRAPARE - Administrator, Civil Service (Medical): No    Lack of Transportation (Non-Medical): No  Physical Activity:  Insufficiently Active (09/27/2023)   Exercise Vital Sign    Days of Exercise per Week: 2 days    Minutes of Exercise per Session: 20 min  Stress: Stress Concern Present (09/27/2023)   [employer] of Occupational Health - Occupational Stress Questionnaire    Feeling of Stress: Rather much  Social Connections: Moderately Isolated (09/27/2023)   Social Connection and Isolation Panel    Frequency of Communication with Friends and Family: More than three times a week    Frequency of Social Gatherings with Friends and Family: More than three times a week    Attends Religious Services: 1 to 4 times per year    Active Member of [employer] or Organizations: No    Attends Banker Meetings: Not on file    Marital Status: Divorced  Intimate Partner Violence: Not At Risk (05/01/2023)   Humiliation, Afraid, Rape, and Kick questionnaire    Fear of Current or Ex-Partner: No    Emotionally Abused: No    Physically Abused: No    Sexually Abused: No     Outpatient Medications Prior to Visit  Medication Sig Dispense Refill   acyclovir  (ZOVIRAX ) 400 MG tablet Take 1 tablet (400 mg total) by mouth 5 (five) times daily. 35 tablet 3   albuterol  (VENTOLIN  HFA) 108 (90 Base) MCG/ACT inhaler Inhale 2 puffs into the lungs every 6 (six) hours as needed for wheezing or shortness of breath. 8 g 2   amLODipine  (NORVASC ) 5 MG tablet TAKE 1 TABLET (5 MG TOTAL) BY MOUTH DAILY. 90 tablet 1   atorvastatin  (LIPITOR) 20 MG tablet Take 1 tablet (20 mg total) by mouth daily. 90 tablet 3   azelastine  (ASTELIN ) 0.1 % nasal spray Place 1 spray into both nostrils 2 (two) times daily. Use in each nostril as directed 30 mL 12   calcium  carbonate (OS-CAL) 600 MG TABS Take 600 mg by mouth 2 (two) times daily with a meal.     carbamide peroxide (DEBROX) 6.5 % OTIC solution Place 5 drops into the left ear 2 (two) times daily. 15 mL 0   cyclobenzaprine  (FLEXERIL ) 5 MG tablet TAKE 1 TABLET BY MOUTH EVERYDAY AT BEDTIME 90  tablet 1   diclofenac  Sodium (VOLTAREN ) 1 % GEL Apply 2 g topically 4 (four) times daily. 100 g G   escitalopram (LEXAPRO) 20 MG tablet Take 20 mg by mouth daily.      estradiol  (ESTRACE ) 0.1 MG/GM vaginal cream Place 1 Applicatorful vaginally 3 (three) times a week. 42.5 g 11   fluticasone  (FLONASE ) 50 MCG/ACT nasal spray Place 2 sprays into both nostrils daily as needed for allergies or rhinitis. After nasal saline 48 mL 5  furosemide  (LASIX ) 20 MG tablet TAKE 1 TABLET (20 MG TOTAL) BY MOUTH AS NEEDED FOR EDEMA. 20 tablet 1   gabapentin  (NEURONTIN ) 100 MG capsule TAKE 1 CAPSULE (100 MG TOTAL) BY MOUTH 3 TIMES A DAY 270 capsule 3   latanoprost (XALATAN) 0.005 % ophthalmic solution 1 drop at bedtime.     LORazepam  (ATIVAN ) 1 MG tablet TAKE 1 TABLET BY MOUTH TWICE A DAY AS DIRECTED FOR ANXIETY/PANIC  2   metoprolol  succinate (TOPROL -XL) 50 MG 24 hr tablet TAKE 0.5 TABLETS (25 MG TOTAL) BY MOUTH DAILY. TAKE WITH OR IMMEDIATELY FOLLOWING A MEAL. 45 tablet 3   Multiple Vitamin (MULTIVITAMIN) tablet Take 1 tablet by mouth daily. Bariatric Vitamin     oxybutynin  (DITROPAN -XL) 10 MG 24 hr tablet TAKE 1 TABLET BY MOUTH EVERYDAY AT BEDTIME 90 tablet 1   pantoprazole  (PROTONIX ) 40 MG tablet TAKE 1 TABLET BY MOUTH EVERY DAY 90 tablet 1   potassium chloride  SA (KLOR-CON  M) 20 MEQ tablet Take 1 tablet (20 mEq total) by mouth daily. 90 tablet 1   saccharomyces boulardii (FLORASTOR) 250 MG capsule Take 1 capsule (250 mg total) by mouth daily. 90 capsule 0   traMADol  (ULTRAM ) 50 MG tablet TAKE 1 TABLET BY MOUTH EVERY 12 HOURS AS NEEDED. 60 tablet 2   VITAMIN D  PO Take by mouth.     zolpidem (AMBIEN) 10 MG tablet Take 10 mg by mouth at bedtime as needed for sleep.     No facility-administered medications prior to visit.    Allergies  Allergen Reactions   Bacitracin-Polymyxin B Rash   Egg-Derived Products    Povidone Iodine Rash and Other (See Comments)    ROS Review of Systems Negative unless  indicated in HPI.    Objective:    Physical Exam Constitutional:      Appearance: Normal appearance.  HENT:     Mouth/Throat:     Mouth: Mucous membranes are moist.  Eyes:     Conjunctiva/sclera: Conjunctivae normal.     Pupils: Pupils are equal, round, and reactive to light.  Cardiovascular:     Rate and Rhythm: Normal rate and regular rhythm.     Pulses: Normal pulses.     Heart sounds: Normal heart sounds.  Pulmonary:     Effort: Pulmonary effort is normal.     Breath sounds: Normal breath sounds.  Musculoskeletal:     Cervical back: Normal range of motion. No tenderness.  Skin:    General: Skin is warm.     Findings: No bruising.  Neurological:     General: No focal deficit present.     Mental Status: She is alert and oriented to person, place, and time. Mental status is at baseline.  Psychiatric:        Mood and Affect: Mood normal.        Behavior: Behavior normal.        Thought Content: Thought content normal.        Judgment: Judgment normal.     BP 118/74   Pulse 83   Temp 98.5 F (36.9 C)   Resp 20   Ht 5' 5 (1.651 m)   Wt 176 lb 8 oz (80.1 kg)   SpO2 97%   BMI 29.37 kg/m  Wt Readings from Last 3 Encounters:  11/10/23 176 lb 8 oz (80.1 kg)  10/31/23 176 lb 3.2 oz (79.9 kg)  10/19/23 174 lb (78.9 kg)     Health Maintenance  Topic Date Due   Diabetic  kidney evaluation - Urine ACR  Never done   MAMMOGRAM  02/25/2017   DEXA SCAN  Never done   OPHTHALMOLOGY EXAM  03/14/2022   DTaP/Tdap/Td (3 - Td or Tdap) 07/13/2022   COVID-19 Vaccine (5 - 2024-25 season) 11/13/2022   INFLUENZA VACCINE  06/11/2024 (Originally 10/13/2023)   HEMOGLOBIN A1C  12/03/2023   Medicare Annual Wellness (AWV)  04/30/2024   Diabetic kidney evaluation - eGFR measurement  06/01/2024   Colonoscopy  11/14/2032   Pneumococcal Vaccine: 50+ Years  Completed   Hepatitis C Screening  Completed   Zoster Vaccines- Shingrix  Completed   HPV VACCINES  Aged Out   Meningococcal B  Vaccine  Aged Out   Hepatitis B Vaccines 19-59 Average Risk  Discontinued    There are no preventive care reminders to display for this patient.  Lab Results  Component Value Date   TSH 0.86 05/09/2023   Lab Results  Component Value Date   WBC 5.6 05/09/2023   HGB 11.9 (L) 05/09/2023   HCT 36.0 05/09/2023   MCV 87.6 05/09/2023   PLT 234.0 05/09/2023   Lab Results  Component Value Date   NA 140 06/02/2023   K 3.6 06/02/2023   CO2 25 06/02/2023   GLUCOSE 115 (H) 06/02/2023   BUN 8 06/02/2023   CREATININE 0.85 06/02/2023   BILITOT 0.5 05/09/2023   ALKPHOS 84 05/09/2023   AST 32 05/09/2023   ALT 27 05/09/2023   PROT 6.5 05/09/2023   ALBUMIN 3.5 05/09/2023   CALCIUM  9.0 06/02/2023   ANIONGAP 8 01/29/2021   EGFR 75 06/02/2023   GFR 91.18 05/09/2023   Lab Results  Component Value Date   CHOL 133 06/02/2023   Lab Results  Component Value Date   HDL 64 06/02/2023   Lab Results  Component Value Date   LDLCALC 54 06/02/2023   Lab Results  Component Value Date   TRIG 66 06/02/2023   Lab Results  Component Value Date   CHOLHDL 2.1 06/02/2023   Lab Results  Component Value Date   HGBA1C 5.9 (H) 06/02/2023      Assessment & Plan:  There are no diagnoses linked to this encounter.  Follow-up: No follow-ups on file.   Delicia Berens, NP

## 2023-11-10 NOTE — Telephone Encounter (Signed)
 Copied from CRM [id number]. Topic: General - Call Back - No Documentation >> Nov 10, 2023 12:41 PM Rea C wrote: Reason for CRM: Patient is returning phone call for [facility].  [phone] (M)

## 2023-11-10 NOTE — Telephone Encounter (Signed)
 No note in chart where Almarie called, But pt seen in office today for visit.

## 2023-11-15 ENCOUNTER — Ambulatory Visit: Attending: Internal Medicine

## 2023-11-15 DIAGNOSIS — R296 Repeated falls: Secondary | ICD-10-CM | POA: Diagnosis not present

## 2023-11-15 DIAGNOSIS — R42 Dizziness and giddiness: Secondary | ICD-10-CM | POA: Diagnosis not present

## 2023-11-15 DIAGNOSIS — R262 Difficulty in walking, not elsewhere classified: Secondary | ICD-10-CM | POA: Diagnosis not present

## 2023-11-15 NOTE — Therapy (Signed)
 OUTPATIENT PHYSICAL THERAPY TREATMENT  Patient Name: Sherri Lee MRN: [id number] DOB:1955-09-22, 68 y.o., female Today's Date: 11/15/2023   PCP: Marylynn Verneita CROME, MD REFERRING PROVIDER: Onesimo Claude, MD  END OF SESSION:  PT End of Session - 11/15/23 1548     Visit Number 2    Number of Visits 16    Date for PT Re-Evaluation 01/03/24    Authorization Type [employer] Medicare    Authorization Time Period 11/08/23-01/03/24    Progress Note Due on Visit 10    PT Start Time 1545    PT Stop Time 1610    PT Time Calculation (min) 25 min    Equipment Utilized During Treatment Gait belt    Activity Tolerance Patient tolerated treatment well;No increased pain    Behavior During Therapy [facility] for tasks assessed/performed          Past Medical History:  Diagnosis Date   Anemia    Low iron and hemoglobin   Anginal pain (HCC)    states she thinks it is stress   Anxiety    Asthma    COVID-19    07/05/20, 03/2021   Depression    Dysrhythmia    states she has PVC's   Fibromyalgia    Hypertension    Recurrent vomiting 02/04/2018   Schatzki's ring    dialated 3 times by Geryl    Past Surgical History:  Procedure Laterality Date   ABDOMINAL ADHESION SURGERY     BALLOON DILATION  11/15/2022   Procedure: BALLOON DILATION;  Surgeon: Jinny Carmine, MD;  Location: [facility] ENDOSCOPY;  Service: Endoscopy;;   BUNIONECTOMY     left foot    CERVICAL FUSION     CESAREAN SECTION     CHOLECYSTECTOMY  June 2013   COLONOSCOPY WITH PROPOFOL  N/A 11/15/2022   Procedure: COLONOSCOPY WITH PROPOFOL ;  Surgeon: Jinny Carmine, MD;  Location: [facility] ENDOSCOPY;  Service: Endoscopy;  Laterality: N/A;   ESOPHAGOGASTRODUODENOSCOPY (EGD) WITH PROPOFOL  N/A 11/15/2022   Procedure: ESOPHAGOGASTRODUODENOSCOPY (EGD) WITH PROPOFOL ;  Surgeon: Jinny Carmine, MD;  Location: [facility] ENDOSCOPY;  Service: Endoscopy;  Laterality: N/A;   GASTRIC BYPASS     Patient Active Problem List   Diagnosis Date Noted   Acute midline  low back pain without sciatica 11/05/2023   Fall 11/05/2023   Left hip pain 09/27/2023   BPPV (benign paroxysmal positional vertigo) 09/27/2023   Impacted cerumen, left ear 09/27/2023   Callus of toe 09/27/2023   Vertigo, labyrinthine, unspecified laterality 06/04/2023   Tick bite of right back wall of thorax 05/31/2023   Vertigo 05/31/2023   Antibiotic-associated diarrhea 05/09/2023   Cough with hemoptysis 05/09/2023   Intermittent constipation 04/14/2023   Diarrhea 03/13/2023   Community acquired pneumonia 02/02/2023   Problems with swallowing and mastication 11/15/2022   Stricture of esophagus 11/15/2022   Bronchitis 03/02/2022   Severe depression (HCC) 12/31/2021   Diabetes mellitus without complication (HCC) 12/31/2021   Cervical arthritis 07/30/2021   H/O cervical spine surgery 07/30/2021   Recurrent headache 02/13/2021   Aortic atherosclerosis (HCC) 11/12/2020   Diuretic-induced hypokalemia 09/03/2020   History of COVID-19 08/06/2020   Encounter for screening colonoscopy 07/30/2019   Low back pain associated with a spinal disorder other than radiculopathy or spinal stenosis 10/28/2018   Urge incontinence of urine 08/15/2018   Injury of branch of radial nerve 08/15/2018   Skin lesion of right lower extremity 11/04/2017   Adjustment disorder with mixed disturbance of emotions and conduct 03/31/2017   History of benzodiazepine  use 03/31/2017   Esophageal stricture 06/19/2016   IBS (irritable bowel syndrome) 09/06/2015   IDA (iron deficiency anemia) 09/06/2015   DDD (degenerative disc disease), cervical 09/24/2014   DDD (degenerative disc disease), thoracic 09/24/2014   Tremor of both hands 09/10/2014   Insomnia due to anxiety and fear 02/26/2013   Alopecia areata 07/11/2012   Cervicalgia 07/11/2012   Degenerative joint disease of thoracic spine 10/04/2011   Acute sinusitis 10/04/2011   Essential hypertension 05/10/2011   Prediabetes 05/10/2011   Status post gastric  bypass for obesity 05/10/2011   ONSET DATE: February 2025 REFERRING DIAG: Vertigo  THERAPY DIAG:  Dizziness and giddiness  Difficulty in walking, not elsewhere classified  Repeated falls  Rationale for Evaluation and Treatment: Rehabilitation  SUBJECTIVE:                                                                                                                                                                                             SUBJECTIVE STATEMENT: Pt needs help with vertigo episodes as she has now fallen twice recently.   PERTINENT HISTORY:   67yoF referred to OPPT for vestibular evaluation after persistent episodic vertigo since acute flu infection in February. Pt reports episodes were initially daily occurrence, but less frequent and less severe- always triggered by movement, often by coming to standing too quickly, duration 4 hours or less. Pt also reports chronic trouble with her left ear, recently having ear wax buildup removed. Pt has had 2 falls related to vertigo episodes  in the weeks preceding evaluation here, both occurred while getting up in the middle of night for toiletting. Pt reports hitting her head, no significant injury sustained, also aggravating left hip pain, all imaging thus far reassuring. Pt does report spinning sensation on occasion with symptoms, as well as other dizzy type symptoms, denies any graying/blacking of vision or diplopia. PMH: frequent falls >5 years, s/p L1-L3 transverse fractures, ACDF, COPDm iron deficiency anemia, gastric bypass, diuretic-induced hypokalemia, estrogen deficiency.   PAIN:  Are you having pain? No   PRECAUTIONS: None  WEIGHT BEARING RESTRICTIONS: No   FALLS: Has patient fallen in last 6 months? Yes, x2  LIVING ENVIRONMENT: Lives with: Alone Lives in: Condo  Stairs: None at garage entrance  Has following equipment at home: None   PLOF: fully independent, denies any baseline imbalance.   PATIENT GOALS: stop  vertigo episodes, avoid additional falls   OBJECTIVE:  Note: Objective measures were completed at evaluation unless otherwise noted.  TREATMENT DATE 11/15/23:   Supine: 122/76 87bpm 99%  Sitting:  126/82 90bpm  Standing 124/86 97bpm Standing x 2 minutes 126/87 96bpm   -explanation of orthostatic hypotension, symptoms, safe self management, exacerbating factors -Epley Maneuver for Left sided posterior canal BPPV -what's BPPV and what should I expect following treatment? *tests and treatments were modified to accommodate cervical ROM loss 2/2 ACDF history  -gaze stabilization with horiztonal head turns 2x30sec, then 2x30 c nods    PATIENT EDUCATION: Education details: HEP for gaze stability, compliance needs.  Person educated: [name]  Education method: discussion Education comprehension: Good  HOME EXERCISE PROGRAM: Access Code: [email or web address] URL: [email or web address] Date: 11/15/2023 Prepared by: Peggye Linear  Exercises - Seated Gaze Stabilization with Head Rotation  - 3 x daily - 7 x weekly - 1 sets - 2 reps - 30sec hold - Seated Gaze Stabilization with Head Nod  - 3 x daily - 7 x weekly - 1 sets - 2 reps - 30sec hold  GOALS: Goals reviewed with patient? No   SHORT TERM GOALS: Target date: 11/24/23  Pt to report no incidence of vertigo symptoms in most recent 7 days.  Baseline: Goal status: INITIAL  2.  Pt to reports improved ability to safely manage dizziness symptoms associated with coming to standing too quickly.  Baseline:  Goal status: INITIAL  LONG TERM GOALS: Target date: 01/03/24  Pt to report confident safe self-management of mobility in the setting on chronic orthostatic hypotension.  Baseline:  Goal status: INITIAL  2.  Pt to report no falls or close calls at home in the past 4 weeks.  Baseline: 2 falls in past 2  sweeks.  Goal status: INITIAL  3.  Pt to report no vertigo episodes in past 4 weeks.  Baseline: daily.  Goal status: INITIAL  ASSESSMENT:  CLINICAL IMPRESSION: Pt returns for 1st visit after Epley Man, showing negative dix hallpike left today; orthostatic vitals also normal today. Pt issued a simple balance HEP for accomodation of vestibular system. Patient will benefit from skilled physical therapy intervention to reduce deficits and impairments identified in evaluation, in order to reduce pain, improve quality of life, and maximize activity tolerance for ADL, IADL, and leisure/fitness. Physical therapy will help pt achieve long and short term goals of care.   OBJECTIVE IMPAIRMENTS: Decreased knowledge of condition, decreased use of DME, decreased mobility, difficulty walking, decreased strength, decreased ROM. ACTIVITY LIMITATIONS: Lifting, standing, walking, squatting, transfers, locomotion level PARTICIPATION LIMITATIONS: Cleaning, laundry, interpersonal relationships, driving, yardwork, community activity.  PERSONAL FACTORS: Age, behavior pattern, education, past/current experiences, transportation, profession  are also affecting patient's functional outcome.  REHAB POTENTIAL: Good CLINICAL DECISION MAKING: Medium  EVALUATION COMPLEXITY: Moderate   PLAN:  PT FREQUENCY: 1-2x/week  PT DURATION: 8 weeks  PLANNED INTERVENTIONS: 97110-Therapeutic exercises, 97530- Therapeutic activity, 97112- Neuromuscular re-education, 97535- Self Care, 02859- Manual therapy, [phone]- Canalith repositioning, Patient/Family education, Balance training, Stair training, Vestibular training, and Visual/preceptual remediation/compensation  PLAN FOR NEXT SESSION: monitor orthostatic vital signs as symptoms dictate; accomodation training, review balance HEP  3:55 PM, 11/15/23 Peggye JAYSON Linear, PT, DPT Physical Therapist - [facility]  Outpatient Physical Therapy- Main  Campus [phone]      [place] C, PT 11/15/2023, 3:55 PM

## 2023-11-21 ENCOUNTER — Ambulatory Visit: Admitting: Sports Medicine

## 2023-11-21 ENCOUNTER — Ambulatory Visit: Payer: Self-pay | Admitting: Nurse Practitioner

## 2023-11-21 NOTE — Progress Notes (Signed)
 Please inform pt: No acute changes seen in CT of head.

## 2023-11-22 ENCOUNTER — Other Ambulatory Visit: Payer: Self-pay | Admitting: Internal Medicine

## 2023-11-22 ENCOUNTER — Ambulatory Visit

## 2023-11-30 ENCOUNTER — Telehealth: Payer: Self-pay | Admitting: Physical Therapy

## 2023-11-30 ENCOUNTER — Ambulatory Visit: Admitting: Physical Therapy

## 2023-11-30 NOTE — Therapy (Incomplete)
 OUTPATIENT PHYSICAL THERAPY TREATMENT  Patient Name: Sherri Lee MRN: [id number] DOB:Mar 02, 1956, 68 y.o., female Today's Date: 11/30/2023   PCP: Marylynn Verneita CROME, MD REFERRING PROVIDER: Onesimo Claude, MD  END OF SESSION: ***    Past Medical History:  Diagnosis Date   Anemia    Low iron and hemoglobin   Anginal pain (HCC)    states she thinks it is stress   Anxiety    Asthma    COVID-19    07/05/20, 03/2021   Depression    Dysrhythmia    states she has PVC's   Fibromyalgia    Hypertension    Recurrent vomiting 02/04/2018   Schatzki's ring    dialated 3 times by Geryl    Past Surgical History:  Procedure Laterality Date   ABDOMINAL ADHESION SURGERY     BALLOON DILATION  11/15/2022   Procedure: BALLOON DILATION;  Surgeon: Jinny Carmine, MD;  Location: [facility] ENDOSCOPY;  Service: Endoscopy;;   BUNIONECTOMY     left foot    CERVICAL FUSION     CESAREAN SECTION     CHOLECYSTECTOMY  June 2013   COLONOSCOPY WITH PROPOFOL  N/A 11/15/2022   Procedure: COLONOSCOPY WITH PROPOFOL ;  Surgeon: Jinny Carmine, MD;  Location: [facility] ENDOSCOPY;  Service: Endoscopy;  Laterality: N/A;   ESOPHAGOGASTRODUODENOSCOPY (EGD) WITH PROPOFOL  N/A 11/15/2022   Procedure: ESOPHAGOGASTRODUODENOSCOPY (EGD) WITH PROPOFOL ;  Surgeon: Jinny Carmine, MD;  Location: [facility] ENDOSCOPY;  Service: Endoscopy;  Laterality: N/A;   GASTRIC BYPASS     Patient Active Problem List   Diagnosis Date Noted   Acute midline low back pain without sciatica 11/05/2023   Fall 11/05/2023   Left hip pain 09/27/2023   BPPV (benign paroxysmal positional vertigo) 09/27/2023   Impacted cerumen, left ear 09/27/2023   Callus of toe 09/27/2023   Vertigo, labyrinthine, unspecified laterality 06/04/2023   Tick bite of right back wall of thorax 05/31/2023   Vertigo 05/31/2023   Antibiotic-associated diarrhea 05/09/2023   Cough with hemoptysis 05/09/2023   Intermittent constipation 04/14/2023   Diarrhea 03/13/2023    Community acquired pneumonia 02/02/2023   Problems with swallowing and mastication 11/15/2022   Stricture of esophagus 11/15/2022   Bronchitis 03/02/2022   Severe depression (HCC) 12/31/2021   Diabetes mellitus without complication (HCC) 12/31/2021   Cervical arthritis 07/30/2021   H/O cervical spine surgery 07/30/2021   Recurrent headache 02/13/2021   Aortic atherosclerosis (HCC) 11/12/2020   Diuretic-induced hypokalemia 09/03/2020   History of COVID-19 08/06/2020   Encounter for screening colonoscopy 07/30/2019   Low back pain associated with a spinal disorder other than radiculopathy or spinal stenosis 10/28/2018   Urge incontinence of urine 08/15/2018   Injury of branch of radial nerve 08/15/2018   Skin lesion of right lower extremity 11/04/2017   Adjustment disorder with mixed disturbance of emotions and conduct 03/31/2017   History of benzodiazepine use 03/31/2017   Esophageal stricture 06/19/2016   IBS (irritable bowel syndrome) 09/06/2015   IDA (iron deficiency anemia) 09/06/2015   DDD (degenerative disc disease), cervical 09/24/2014   DDD (degenerative disc disease), thoracic 09/24/2014   Tremor of both hands 09/10/2014   Insomnia due to anxiety and fear 02/26/2013   Alopecia areata 07/11/2012   Cervicalgia 07/11/2012   Degenerative joint disease of thoracic spine 10/04/2011   Acute sinusitis 10/04/2011   Essential hypertension 05/10/2011   Prediabetes 05/10/2011   Status post gastric bypass for obesity 05/10/2011   ONSET DATE: February 2025 REFERRING DIAG: Vertigo  THERAPY DIAG: *** No diagnosis found.  Rationale for Evaluation and Treatment: Rehabilitation  SUBJECTIVE:                                                                                                                                                                                             SUBJECTIVE STATEMENT: ***  Pt needs help with vertigo episodes as she has now fallen twice recently.     PERTINENT HISTORY:   68yoF referred to OPPT for vestibular evaluation after persistent episodic vertigo since acute flu infection in February. Pt reports episodes were initially daily occurrence, but less frequent and less severe- always triggered by movement, often by coming to standing too quickly, duration 4 hours or less. Pt also reports chronic trouble with her left ear, recently having ear wax buildup removed. Pt has had 2 falls related to vertigo episodes  in the weeks preceding evaluation here, both occurred while getting up in the middle of night for toiletting. Pt reports hitting her head, no significant injury sustained, also aggravating left hip pain, all imaging thus far reassuring. Pt does report spinning sensation on occasion with symptoms, as well as other dizzy type symptoms, denies any graying/blacking of vision or diplopia. PMH: frequent falls >5 years, s/p L1-L3 transverse fractures, ACDF, COPDm iron deficiency anemia, gastric bypass, diuretic-induced hypokalemia, estrogen deficiency.   PAIN:  Are you having pain? No   PRECAUTIONS: None  WEIGHT BEARING RESTRICTIONS: No   FALLS: Has patient fallen in last 6 months? Yes, x2  LIVING ENVIRONMENT: Lives with: Alone Lives in: Condo  Stairs: None at garage entrance  Has following equipment at home: None   PLOF: fully independent, denies any baseline imbalance.   PATIENT GOALS: stop vertigo episodes, avoid additional falls   OBJECTIVE:  Note: Objective measures were completed at evaluation unless otherwise noted.                                                                                                                              TREATMENT DATE 11/30/23:   ***   Supine: 122/76 87bpm 99%  Sitting:  126/82 90bpm  Standing 124/86 97bpm Standing x 2 minutes 126/87 96bpm   -explanation of orthostatic hypotension, symptoms, safe self management, exacerbating factors -Epley Maneuver for Left sided posterior canal  BPPV -what's BPPV and what should I expect following treatment? *tests and treatments were modified to accommodate cervical ROM loss 2/2 ACDF history  -gaze stabilization with horiztonal head turns 2x30sec, then 2x30 c nods   ***    PATIENT EDUCATION: Education details: HEP for gaze stability, compliance needs.  Person educated: [name]  Education method: discussion Education comprehension: Good  HOME EXERCISE PROGRAM: Access Code: [email or web address] URL: [email or web address] Date: 11/15/2023 Prepared by: Peggye Linear  Exercises - Seated Gaze Stabilization with Head Rotation  - 3 x daily - 7 x weekly - 1 sets - 2 reps - 30sec hold - Seated Gaze Stabilization with Head Nod  - 3 x daily - 7 x weekly - 1 sets - 2 reps - 30sec hold  GOALS: Goals reviewed with patient? No   SHORT TERM GOALS: Target date: 11/24/23  Pt to report no incidence of vertigo symptoms in most recent 7 days.  Baseline: Goal status: INITIAL  2.  Pt to reports improved ability to safely manage dizziness symptoms associated with coming to standing too quickly.  Baseline:  Goal status: INITIAL  LONG TERM GOALS: Target date: 01/03/24  Pt to report confident safe self-management of mobility in the setting on chronic orthostatic hypotension.  Baseline:  Goal status: INITIAL  2.  Pt to report no falls or close calls at home in the past 4 weeks.  Baseline: 2 falls in past 2 sweeks.  Goal status: INITIAL  3.  Pt to report no vertigo episodes in past 4 weeks.  Baseline: daily.  Goal status: INITIAL  ASSESSMENT:  CLINICAL IMPRESSION: *** Pt returns for 1st visit after Epley Man, showing negative dix hallpike left today; orthostatic vitals also normal today. Pt issued a simple balance HEP for accomodation of vestibular system. Patient will benefit from skilled physical therapy intervention to reduce deficits and impairments identified in evaluation, in order to reduce pain, improve quality of life,  and maximize activity tolerance for ADL, IADL, and leisure/fitness. Physical therapy will help pt achieve long and short term goals of care.   OBJECTIVE IMPAIRMENTS: Decreased knowledge of condition, decreased use of DME, decreased mobility, difficulty walking, decreased strength, decreased ROM. ACTIVITY LIMITATIONS: Lifting, standing, walking, squatting, transfers, locomotion level PARTICIPATION LIMITATIONS: Cleaning, laundry, interpersonal relationships, driving, yardwork, community activity.  PERSONAL FACTORS: Age, behavior pattern, education, past/current experiences, transportation, profession  are also affecting patient's functional outcome.  REHAB POTENTIAL: Good CLINICAL DECISION MAKING: Medium  EVALUATION COMPLEXITY: Moderate   PLAN:  PT FREQUENCY: 1-2x/week  PT DURATION: 8 weeks  PLANNED INTERVENTIONS: 97110-Therapeutic exercises, 97530- Therapeutic activity, 97112- Neuromuscular re-education, 97535- Self Care, 02859- Manual therapy, [phone]- Canalith repositioning, Patient/Family education, Balance training, Stair training, Vestibular training, and Visual/preceptual remediation/compensation  PLAN FOR NEXT SESSION:  *** monitor orthostatic vital signs as symptoms dictate; accomodation training, review balance HEP   Jream Broyles, PT, DPT, NCS, CSRS Physical Therapist - [place]  [facility]  8:13 AM 11/30/23

## 2023-11-30 NOTE — Telephone Encounter (Signed)
 Author left patient voicemail regarding missed appointment. Requested patient return call to discuss PT POC moving forward.     Sherri Lee, SPT Physical Therapy Student - [place]  [facility]

## 2023-11-30 NOTE — Therapy (Incomplete)
 OUTPATIENT PHYSICAL THERAPY TREATMENT  Patient Name: Sherri Lee MRN: [id number] DOB:1955/03/15, 68 y.o., female Today's Date: 11/30/2023   PCP: Marylynn Verneita CROME, MD REFERRING PROVIDER: Onesimo Claude, MD  END OF SESSION: ***    Past Medical History:  Diagnosis Date   Anemia    Low iron and hemoglobin   Anginal pain (HCC)    states she thinks it is stress   Anxiety    Asthma    COVID-19    07/05/20, 03/2021   Depression    Dysrhythmia    states she has PVC's   Fibromyalgia    Hypertension    Recurrent vomiting 02/04/2018   Schatzki's ring    dialated 3 times by Geryl    Past Surgical History:  Procedure Laterality Date   ABDOMINAL ADHESION SURGERY     BALLOON DILATION  11/15/2022   Procedure: BALLOON DILATION;  Surgeon: Jinny Carmine, MD;  Location: [facility] ENDOSCOPY;  Service: Endoscopy;;   BUNIONECTOMY     left foot    CERVICAL FUSION     CESAREAN SECTION     CHOLECYSTECTOMY  June 2013   COLONOSCOPY WITH PROPOFOL  N/A 11/15/2022   Procedure: COLONOSCOPY WITH PROPOFOL ;  Surgeon: Jinny Carmine, MD;  Location: [facility] ENDOSCOPY;  Service: Endoscopy;  Laterality: N/A;   ESOPHAGOGASTRODUODENOSCOPY (EGD) WITH PROPOFOL  N/A 11/15/2022   Procedure: ESOPHAGOGASTRODUODENOSCOPY (EGD) WITH PROPOFOL ;  Surgeon: Jinny Carmine, MD;  Location: [facility] ENDOSCOPY;  Service: Endoscopy;  Laterality: N/A;   GASTRIC BYPASS     Patient Active Problem List   Diagnosis Date Noted   Acute midline low back pain without sciatica 11/05/2023   Fall 11/05/2023   Left hip pain 09/27/2023   BPPV (benign paroxysmal positional vertigo) 09/27/2023   Impacted cerumen, left ear 09/27/2023   Callus of toe 09/27/2023   Vertigo, labyrinthine, unspecified laterality 06/04/2023   Tick bite of right back wall of thorax 05/31/2023   Vertigo 05/31/2023   Antibiotic-associated diarrhea 05/09/2023   Cough with hemoptysis 05/09/2023   Intermittent constipation 04/14/2023   Diarrhea 03/13/2023    Community acquired pneumonia 02/02/2023   Problems with swallowing and mastication 11/15/2022   Stricture of esophagus 11/15/2022   Bronchitis 03/02/2022   Severe depression (HCC) 12/31/2021   Diabetes mellitus without complication (HCC) 12/31/2021   Cervical arthritis 07/30/2021   H/O cervical spine surgery 07/30/2021   Recurrent headache 02/13/2021   Aortic atherosclerosis (HCC) 11/12/2020   Diuretic-induced hypokalemia 09/03/2020   History of COVID-19 08/06/2020   Encounter for screening colonoscopy 07/30/2019   Low back pain associated with a spinal disorder other than radiculopathy or spinal stenosis 10/28/2018   Urge incontinence of urine 08/15/2018   Injury of branch of radial nerve 08/15/2018   Skin lesion of right lower extremity 11/04/2017   Adjustment disorder with mixed disturbance of emotions and conduct 03/31/2017   History of benzodiazepine use 03/31/2017   Esophageal stricture 06/19/2016   IBS (irritable bowel syndrome) 09/06/2015   IDA (iron deficiency anemia) 09/06/2015   DDD (degenerative disc disease), cervical 09/24/2014   DDD (degenerative disc disease), thoracic 09/24/2014   Tremor of both hands 09/10/2014   Insomnia due to anxiety and fear 02/26/2013   Alopecia areata 07/11/2012   Cervicalgia 07/11/2012   Degenerative joint disease of thoracic spine 10/04/2011   Acute sinusitis 10/04/2011   Essential hypertension 05/10/2011   Prediabetes 05/10/2011   Status post gastric bypass for obesity 05/10/2011   ONSET DATE: February 2025 REFERRING DIAG: Vertigo  THERAPY DIAG: *** No diagnosis found.  Rationale for Evaluation and Treatment: Rehabilitation  SUBJECTIVE:                                                                                                                                                                                             SUBJECTIVE STATEMENT: ***  Pt needs help with vertigo episodes as she has now fallen twice recently.     PERTINENT HISTORY:   68yoF referred to OPPT for vestibular evaluation after persistent episodic vertigo since acute flu infection in February. Pt reports episodes were initially daily occurrence, but less frequent and less severe- always triggered by movement, often by coming to standing too quickly, duration 4 hours or less. Pt also reports chronic trouble with her left ear, recently having ear wax buildup removed. Pt has had 2 falls related to vertigo episodes  in the weeks preceding evaluation here, both occurred while getting up in the middle of night for toiletting. Pt reports hitting her head, no significant injury sustained, also aggravating left hip pain, all imaging thus far reassuring. Pt does report spinning sensation on occasion with symptoms, as well as other dizzy type symptoms, denies any graying/blacking of vision or diplopia. PMH: frequent falls >5 years, s/p L1-L3 transverse fractures, ACDF, COPDm iron deficiency anemia, gastric bypass, diuretic-induced hypokalemia, estrogen deficiency.   PAIN:  Are you having pain? No   PRECAUTIONS: None  WEIGHT BEARING RESTRICTIONS: No   FALLS: Has patient fallen in last 6 months? Yes, x2  LIVING ENVIRONMENT: Lives with: Alone Lives in: Condo  Stairs: None at garage entrance  Has following equipment at home: None   PLOF: fully independent, denies any baseline imbalance.   PATIENT GOALS: stop vertigo episodes, avoid additional falls   OBJECTIVE:  Note: Objective measures were completed at evaluation unless otherwise noted.                                                                                                                              TREATMENT DATE 11/30/23:   ***   Supine: 122/76 87bpm 99%  Sitting:  126/82 90bpm  Standing 124/86 97bpm Standing x 2 minutes 126/87 96bpm   -explanation of orthostatic hypotension, symptoms, safe self management, exacerbating factors -Epley Maneuver for Left sided posterior canal  BPPV -what's BPPV and what should I expect following treatment? *tests and treatments were modified to accommodate cervical ROM loss 2/2 ACDF history  -gaze stabilization with horiztonal head turns 2x30sec, then 2x30 c nods   ***    PATIENT EDUCATION: Education details: HEP for gaze stability, compliance needs.  Person educated: [name]  Education method: discussion Education comprehension: Good  HOME EXERCISE PROGRAM: Access Code: [email or web address] URL: [email or web address] Date: 11/15/2023 Prepared by: Peggye Linear  Exercises - Seated Gaze Stabilization with Head Rotation  - 3 x daily - 7 x weekly - 1 sets - 2 reps - 30sec hold - Seated Gaze Stabilization with Head Nod  - 3 x daily - 7 x weekly - 1 sets - 2 reps - 30sec hold  GOALS: Goals reviewed with patient? No   SHORT TERM GOALS: Target date: 11/24/23  Pt to report no incidence of vertigo symptoms in most recent 7 days.  Baseline: Goal status: INITIAL  2.  Pt to reports improved ability to safely manage dizziness symptoms associated with coming to standing too quickly.  Baseline:  Goal status: INITIAL  LONG TERM GOALS: Target date: 01/03/24  Pt to report confident safe self-management of mobility in the setting on chronic orthostatic hypotension.  Baseline:  Goal status: INITIAL  2.  Pt to report no falls or close calls at home in the past 4 weeks.  Baseline: 2 falls in past 2 sweeks.  Goal status: INITIAL  3.  Pt to report no vertigo episodes in past 4 weeks.  Baseline: daily.  Goal status: INITIAL  ASSESSMENT:  CLINICAL IMPRESSION: *** Pt returns for 1st visit after Epley Man, showing negative dix hallpike left today; orthostatic vitals also normal today. Pt issued a simple balance HEP for accomodation of vestibular system. Patient will benefit from skilled physical therapy intervention to reduce deficits and impairments identified in evaluation, in order to reduce pain, improve quality of life,  and maximize activity tolerance for ADL, IADL, and leisure/fitness. Physical therapy will help pt achieve long and short term goals of care.   OBJECTIVE IMPAIRMENTS: Decreased knowledge of condition, decreased use of DME, decreased mobility, difficulty walking, decreased strength, decreased ROM. ACTIVITY LIMITATIONS: Lifting, standing, walking, squatting, transfers, locomotion level PARTICIPATION LIMITATIONS: Cleaning, laundry, interpersonal relationships, driving, yardwork, community activity.  PERSONAL FACTORS: Age, behavior pattern, education, past/current experiences, transportation, profession  are also affecting patient's functional outcome.  REHAB POTENTIAL: Good CLINICAL DECISION MAKING: Medium  EVALUATION COMPLEXITY: Moderate   PLAN:  PT FREQUENCY: 1-2x/week  PT DURATION: 8 weeks  PLANNED INTERVENTIONS: 97110-Therapeutic exercises, 97530- Therapeutic activity, 97112- Neuromuscular re-education, 97535- Self Care, [id number]- Manual therapy, [phone]- Canalith repositioning, Patient/Family education, Balance training, Stair training, Vestibular training, and Visual/preceptual remediation/compensation  PLAN FOR NEXT SESSION:  *** monitor orthostatic vital signs as symptoms dictate; accomodation training, review balance HEP   Chiquita Silvan, SPT Physical Therapy Student - [place]  [facility]  8:28 AM 11/30/23

## 2023-12-03 DIAGNOSIS — R42 Dizziness and giddiness: Secondary | ICD-10-CM | POA: Insufficient documentation

## 2023-12-03 NOTE — Assessment & Plan Note (Signed)
 Vertigo persists with dizziness exacerbated by positional changes. Symptoms worsened post-physical therapy, likely due to blood pressure fluctuations. Meclizine provides relief but causes drowsiness. - Continue meclizine at night. - Advise slow positional changes. - Consider cane for mobility. - Discuss therapy adjustments with physical therapist.

## 2023-12-03 NOTE — Assessment & Plan Note (Signed)
 Hip pain improved slightly with cautious movements. Scheduled for orthopedic evaluation next week. - Attend orthopedic appointment for further evaluation and management.

## 2023-12-04 ENCOUNTER — Ambulatory Visit (INDEPENDENT_AMBULATORY_CARE_PROVIDER_SITE_OTHER): Admitting: Internal Medicine

## 2023-12-04 ENCOUNTER — Encounter: Payer: Self-pay | Admitting: Internal Medicine

## 2023-12-04 VITALS — BP 124/80 | HR 83 | Ht 65.0 in | Wt 175.0 lb

## 2023-12-04 DIAGNOSIS — D509 Iron deficiency anemia, unspecified: Secondary | ICD-10-CM

## 2023-12-04 DIAGNOSIS — M545 Low back pain, unspecified: Secondary | ICD-10-CM

## 2023-12-04 DIAGNOSIS — E785 Hyperlipidemia, unspecified: Secondary | ICD-10-CM

## 2023-12-04 DIAGNOSIS — M5030 Other cervical disc degeneration, unspecified cervical region: Secondary | ICD-10-CM

## 2023-12-04 DIAGNOSIS — I10 Essential (primary) hypertension: Secondary | ICD-10-CM | POA: Diagnosis not present

## 2023-12-04 DIAGNOSIS — M5134 Other intervertebral disc degeneration, thoracic region: Secondary | ICD-10-CM

## 2023-12-04 DIAGNOSIS — M25552 Pain in left hip: Secondary | ICD-10-CM

## 2023-12-04 DIAGNOSIS — R7303 Prediabetes: Secondary | ICD-10-CM

## 2023-12-04 DIAGNOSIS — E119 Type 2 diabetes mellitus without complications: Secondary | ICD-10-CM | POA: Diagnosis not present

## 2023-12-04 MED ORDER — TRAMADOL HCL 50 MG PO TABS: 50.0000 mg | ORAL_TABLET | Freq: Three times a day (TID) | ORAL | 5 refills | Status: DC | PRN

## 2023-12-04 MED ORDER — METOPROLOL SUCCINATE ER 50 MG PO TB24: ORAL_TABLET | ORAL | 3 refills | Status: AC

## 2023-12-04 MED ORDER — TETANUS-DIPHTH-ACELL PERTUSSIS 5-2.5-18.5 LF-MCG/0.5 IM SUSY: 0.5000 mL | PREFILLED_SYRINGE | Freq: Once | INTRAMUSCULAR | 0 refills | Status: AC

## 2023-12-04 MED ORDER — AMLODIPINE BESYLATE 5 MG PO TABS: 5.0000 mg | ORAL_TABLET | Freq: Every day | ORAL | 1 refills | Status: AC

## 2023-12-04 NOTE — Assessment & Plan Note (Signed)
 Persistent for several months,  now radiating to knee .

## 2023-12-04 NOTE — Assessment & Plan Note (Signed)
 Diagnosed in 2022 with

## 2023-12-04 NOTE — Assessment & Plan Note (Signed)
 Tramadol  refilled.  Unclear if her left hip pain is referred from lumbar spine or due to DJD . Referring to [facility]

## 2023-12-04 NOTE — Progress Notes (Signed)
 Subjective:  Patient ID: Sherri Lee, female    DOB: Nov 03, 1955  Age: 67 y.o. MRN: [id number]  CC: The primary encounter diagnosis was Essential hypertension. Diagnoses of Diabetes mellitus without complication (HCC), Iron deficiency anemia, unspecified iron deficiency anemia type, Hyperlipidemia, unspecified hyperlipidemia type, Left hip pain, DDD (degenerative disc disease), cervical, DDD (degenerative disc disease), thoracic, Prediabetes, and Low back pain associated with a spinal disorder other than radiculopathy or spinal stenosis were also pertinent to this visit.   HPI Sherri Lee presents for  Chief Complaint  Patient presents with   Medical Management of Chronic Issues    1) Prediabetes;   She  feels generally well,  But is not  exercising regularlydue to lef hip pain.  Following a carbohydrate modified diet 6 days per week. Denies numbness, burning and tingling of extremities. Appetite is good.     2) left hip pain : inguinal area start  now radiating to knee and back.  Aggravated by walking . Relieved with rest  . Worse as the day progressed.  Using ibuprofen and tramadol .   3) Stress: mother is in A/L n the 2nd floor.  No elevator .   Climbing stairs. and she is responsible    Outpatient Medications Prior to Visit  Medication Sig Dispense Refill   acyclovir  (ZOVIRAX ) 400 MG tablet Take 1 tablet (400 mg total) by mouth 5 (five) times daily. 35 tablet 3   albuterol  (VENTOLIN  HFA) 108 (90 Base) MCG/ACT inhaler Inhale 2 puffs into the lungs every 6 (six) hours as needed for wheezing or shortness of breath. 8 g 2   atorvastatin  (LIPITOR) 20 MG tablet Take 1 tablet (20 mg total) by mouth daily. 90 tablet 3   azelastine  (ASTELIN ) 0.1 % nasal spray Place 1 spray into both nostrils 2 (two) times daily. Use in each nostril as directed 30 mL 12   calcium  carbonate (OS-CAL) 600 MG TABS Take 600 mg by mouth 2 (two) times daily with a meal.     carbamide  peroxide (DEBROX) 6.5 % OTIC solution Place 5 drops into the left ear 2 (two) times daily. 15 mL 0   cyclobenzaprine  (FLEXERIL ) 5 MG tablet TAKE 1 TABLET BY MOUTH EVERYDAY AT BEDTIME 90 tablet 1   diclofenac  Sodium (VOLTAREN ) 1 % GEL Apply 2 g topically 4 (four) times daily. 100 g G   escitalopram (LEXAPRO) 20 MG tablet Take 20 mg by mouth daily.      estradiol  (ESTRACE ) 0.1 MG/GM vaginal cream Place 1 Applicatorful vaginally 3 (three) times a week. 42.5 g 11   fluticasone  (FLONASE ) 50 MCG/ACT nasal spray Place 2 sprays into both nostrils daily as needed for allergies or rhinitis. After nasal saline 48 mL 5   furosemide  (LASIX ) 20 MG tablet TAKE 1 TABLET (20 MG TOTAL) BY MOUTH AS NEEDED FOR EDEMA. 20 tablet 1   gabapentin  (NEURONTIN ) 100 MG capsule TAKE 1 CAPSULE (100 MG TOTAL) BY MOUTH 3 TIMES A DAY 270 capsule 3   latanoprost (XALATAN) 0.005 % ophthalmic solution 1 drop at bedtime.     LORazepam  (ATIVAN ) 1 MG tablet TAKE 1 TABLET BY MOUTH TWICE A DAY AS DIRECTED FOR ANXIETY/PANIC  2   Multiple Vitamin (MULTIVITAMIN) tablet Take 1 tablet by mouth daily. Bariatric Vitamin     oxybutynin  (DITROPAN -XL) 10 MG 24 hr tablet TAKE 1 TABLET BY MOUTH EVERYDAY AT BEDTIME 90 tablet 1   pantoprazole  (PROTONIX ) 40 MG tablet TAKE 1 TABLET BY MOUTH EVERY DAY 90 tablet  1   potassium chloride  SA (KLOR-CON  M) 20 MEQ tablet Take 1 tablet (20 mEq total) by mouth daily. 90 tablet 1   saccharomyces boulardii (FLORASTOR) 250 MG capsule Take 1 capsule (250 mg total) by mouth daily. 90 capsule 0   VITAMIN D  PO Take by mouth.     zolpidem (AMBIEN) 10 MG tablet Take 10 mg by mouth at bedtime as needed for sleep.     amLODipine  (NORVASC ) 5 MG tablet TAKE 1 TABLET (5 MG TOTAL) BY MOUTH DAILY. 90 tablet 1   metoprolol  succinate (TOPROL -XL) 50 MG 24 hr tablet TAKE 0.5 TABLETS (25 MG TOTAL) BY MOUTH DAILY. TAKE WITH OR IMMEDIATELY FOLLOWING A MEAL. 45 tablet 3   traMADol  (ULTRAM ) 50 MG tablet TAKE 1 TABLET BY MOUTH EVERY 12  HOURS AS NEEDED. 60 tablet 2   No facility-administered medications prior to visit.    Review of Systems;  Patient denies headache, fevers, malaise, unintentional weight loss, skin rash, eye pain, sinus congestion and sinus pain, sore throat, dysphagia,  hemoptysis , cough, dyspnea, wheezing, chest pain, palpitations, orthopnea, edema, abdominal pain, nausea, melena, diarrhea, constipation, flank pain, dysuria, hematuria, urinary  Frequency, nocturia, numbness, tingling, seizures,  Focal weakness, Loss of consciousness,  Tremor, insomnia, depression, anxiety, and suicidal ideation.      Objective:  BP 124/80   Pulse 83   Ht 5' 5 (1.651 m)   Wt 175 lb (79.4 kg)   SpO2 98%   BMI 29.12 kg/m   BP Readings from Last 3 Encounters:  12/04/23 124/80  11/10/23 118/74  10/31/23 128/84    Wt Readings from Last 3 Encounters:  12/04/23 175 lb (79.4 kg)  11/10/23 176 lb 8 oz (80.1 kg)  10/31/23 176 lb 3.2 oz (79.9 kg)    Physical Exam Vitals reviewed.  Constitutional:      General: She is not in acute distress.    Appearance: Normal appearance. She is normal weight. She is not ill-appearing, toxic-appearing or diaphoretic.  HENT:     Head: Normocephalic.  Eyes:     General: No scleral icterus.       Right eye: No discharge.        Left eye: No discharge.     Conjunctiva/sclera: Conjunctivae normal.  Cardiovascular:     Rate and Rhythm: Normal rate and regular rhythm.     Heart sounds: Normal heart sounds.  Pulmonary:     Effort: Pulmonary effort is normal. No respiratory distress.     Breath sounds: Normal breath sounds.  Musculoskeletal:     Comments: Pain with flexion, abduction , internal and external  rotation   Skin:    General: Skin is warm and dry.  Neurological:     General: No focal deficit present.     Mental Status: She is alert and oriented to person, place, and time. Mental status is at baseline.  Psychiatric:        Mood and Affect: Mood normal.         Behavior: Behavior normal.        Thought Content: Thought content normal.        Judgment: Judgment normal.     Lab Results  Component Value Date   HGBA1C 5.9 (H) 06/02/2023   HGBA1C 6.4 06/29/2022   HGBA1C 5.9 (A) 12/31/2021    Lab Results  Component Value Date   CREATININE 0.85 06/02/2023   CREATININE 0.63 05/09/2023   CREATININE 0.71 04/11/2023    Lab Results  Component Value  Date   WBC 5.6 05/09/2023   HGB 11.9 (L) 05/09/2023   HCT 36.0 05/09/2023   PLT 234.0 05/09/2023   GLUCOSE 115 (H) 06/02/2023   CHOL 133 06/02/2023   TRIG 66 06/02/2023   HDL 64 06/02/2023   LDLDIRECT 66 06/02/2023   LDLCALC 54 06/02/2023   ALT 27 05/09/2023   AST 32 05/09/2023   NA 140 06/02/2023   K 3.6 06/02/2023   CL 105 06/02/2023   CREATININE 0.85 06/02/2023   BUN 8 06/02/2023   CO2 25 06/02/2023   TSH 0.86 05/09/2023   HGBA1C 5.9 (H) 06/02/2023    CT HEAD WO CONTRAST ( ) Result Date: 11/20/2023 EXAM: CT HEAD WITHOUT CONTRAST 11/09/2023 03:36:20 PM TECHNIQUE: CT of the head was performed without the administration of intravenous contrast. Automated exposure control, iterative reconstruction, and/or weight based adjustment of the mA/kV was utilized to reduce the radiation dose to as low as reasonably achievable. COMPARISON: CT of the head dated 01/29/2021. CLINICAL HISTORY: Mutiple falls and hit front of head approx one week ago; dizziness; HTN; no surg no ca no sz or stroke. FINDINGS: BRAIN AND VENTRICLES: No acute hemorrhage. No evidence of acute infarct. No hydrocephalus. No extra-axial collection. No mass effect or midline shift. Age-related atrophy. ORBITS: No acute abnormality. SINUSES: No acute abnormality. SOFT TISSUES AND SKULL: No acute soft tissue abnormality. No skull fracture. IMPRESSION: 1. No acute intracranial abnormality. 2. Age-related atrophy. Electronically signed by: Evalene Coho MD 11/20/2023 06:58 AM EDT RP Workstation: [id number]    Assessment & Plan:   .Essential hypertension -     Microalbumin / creatinine urine ratio -     Comprehensive metabolic panel with GFR -     amLODIPine  Besylate; Take 1 tablet (5 mg total) by mouth daily.  Dispense: 90 tablet; Refill: 1 -     Metoprolol  Succinate ER; Take with or immediately following a meal.TAKE 0.5 TABLETS (25 MG TOTAL) BY MOUTH DAILY. TAKE WITH OR IMMEDIATELY FOLLOWING A MEAL.  Dispense: 45 tablet; Refill: 3  Diabetes mellitus without complication (HCC) Assessment & Plan: Diagnosed in 2022 with   Orders: -     Hemoglobin A1c -     Microalbumin / creatinine urine ratio -     Comprehensive metabolic panel with GFR  Iron deficiency anemia, unspecified iron deficiency anemia type Assessment & Plan: Resolved by last check  secondary to  bariatric surgery and use of PPI   Lab Results  Component Value Date   IRON 86 05/09/2023   TIBC 333.2 05/09/2023   FERRITIN 23.5 05/09/2023     Lab Results  Component Value Date   WBC 5.6 05/09/2023   HGB 11.9 (L) 05/09/2023   HCT 36.0 05/09/2023   MCV 87.6 05/09/2023   PLT 234.0 05/09/2023     Orders: -     CBC with Differential/Platelet  Hyperlipidemia, unspecified hyperlipidemia type -     Lipid panel -     LDL cholesterol, direct  Left hip pain Assessment & Plan: Persistent for several months,  now radiating to knee .    Orders: -     Ambulatory referral to Orthopedics  DDD (degenerative disc disease), cervical -     traMADol  HCl; Take 1 tablet (50 mg total) by mouth every 8 (eight) hours as needed.  Dispense: 90 tablet; Refill: 5  DDD (degenerative disc disease), thoracic -     traMADol  HCl; Take 1 tablet (50 mg total) by mouth every 8 (eight) hours as needed.  Dispense: 90  tablet; Refill: 5  Prediabetes Assessment & Plan: A1c was nearly diagnostic of type 2 DM but has improved  with diet,  and has been  maintained at > 6.0.   I  have addressed  BMI and recommended a low glycemic index diet utilizing smaller more frequent  meals to increase metabolism and prevent enlargement of stomach .  I have also recommended that patient start exercising with a goal of 30 minutes of walking a minimum of 5 days per week, but her left hip pain is an obstacle to exercise. Currently    Lab Results  Component Value Date   HGBA1C 5.9 (H) 06/02/2023      Low back pain associated with a spinal disorder other than radiculopathy or spinal stenosis Assessment & Plan: Tramadol  refilled.  Unclear if her left hip pain is referred from lumbar spine or due to DJD . Referring to [facility]   Other orders -     Tetanus-Diphth-Acell Pertussis; Inject 0.5 mLs into the muscle once for 1 dose.  Dispense: 0.5 mL; Refill: 0     I spent 34 minutes on the day of this face to face encounter reviewing patient's  most recent visit with cardiology,  nephrology,  and neurology,  prior relevant surgical and non surgical procedures, recent  labs and imaging studies, counseling on weight management,  reviewing the assessment and plan with patient, and post visit ordering and reviewing of  diagnostics and therapeutics with patient  .   Follow-up: Return in about 6 months (around 06/02/2024).   Verneita LITTIE Kettering, MD

## 2023-12-04 NOTE — Patient Instructions (Addendum)
 Referral to [facility] is in process   You can take 3000 mg of acetominophen (tylenol ) every day safely  In divided doses (750 mg  every 6 hours  Or 1000 mg every 8 hours.)   add the tramadol  if your pain is not relieved.    You should avoid advil,  motrin  and aleve because of your bariatric surgery

## 2023-12-04 NOTE — Assessment & Plan Note (Signed)
 A1c was nearly diagnostic of type 2 DM but has improved  with diet,  and has been  maintained at > 6.0.   I  have addressed  BMI and recommended a low glycemic index diet utilizing smaller more frequent meals to increase metabolism and prevent enlargement of stomach .  I have also recommended that patient start exercising with a goal of 30 minutes of walking a minimum of 5 days per week, but her left hip pain is an obstacle to exercise. Currently    Lab Results  Component Value Date   HGBA1C 5.9 (H) 06/02/2023

## 2023-12-04 NOTE — Assessment & Plan Note (Signed)
 Resolved by last check  secondary to  bariatric surgery and use of PPI   Lab Results  Component Value Date   IRON 86 05/09/2023   TIBC 333.2 05/09/2023   FERRITIN 23.5 05/09/2023     Lab Results  Component Value Date   WBC 5.6 05/09/2023   HGB 11.9 (L) 05/09/2023   HCT 36.0 05/09/2023   MCV 87.6 05/09/2023   PLT 234.0 05/09/2023

## 2023-12-05 ENCOUNTER — Other Ambulatory Visit: Payer: Self-pay | Admitting: Internal Medicine

## 2023-12-05 ENCOUNTER — Telehealth: Payer: Self-pay

## 2023-12-05 ENCOUNTER — Other Ambulatory Visit: Payer: Self-pay

## 2023-12-05 DIAGNOSIS — M5030 Other cervical disc degeneration, unspecified cervical region: Secondary | ICD-10-CM

## 2023-12-05 DIAGNOSIS — M5134 Other intervertebral disc degeneration, thoracic region: Secondary | ICD-10-CM

## 2023-12-05 DIAGNOSIS — E876 Hypokalemia: Secondary | ICD-10-CM

## 2023-12-05 LAB — LDL CHOLESTEROL, DIRECT: Direct LDL: 56 mg/dL

## 2023-12-05 LAB — COMPREHENSIVE METABOLIC PANEL WITH GFR
ALT: 17 U/L (ref 0–35)
AST: 26 U/L (ref 0–37)
Albumin: 3.7 g/dL (ref 3.5–5.2)
Alkaline Phosphatase: 109 U/L (ref 39–117)
BUN: 8 mg/dL (ref 6–23)
CO2: 32 meq/L (ref 19–32)
Calcium: 8.8 mg/dL (ref 8.4–10.5)
Chloride: 99 meq/L (ref 96–112)
Creatinine, Ser: 0.81 mg/dL (ref 0.40–1.20)
GFR: 74.31 mL/min (ref >60.00)
Glucose, Bld: 118 mg/dL — ABNORMAL HIGH (ref 70–99)
Potassium: 2.6 meq/L — CL (ref 3.5–5.1)
Sodium: 141 meq/L (ref 135–145)
Total Bilirubin: 0.7 mg/dL (ref 0.2–1.2)
Total Protein: 6.5 g/dL (ref 6.0–8.3)

## 2023-12-05 LAB — CBC WITH DIFFERENTIAL/PLATELET
Basophils Absolute: 0.1 K/uL (ref 0.0–0.1)
Basophils Relative: 1.2 % (ref 0.0–3.0)
Eosinophils Absolute: 0.5 K/uL (ref 0.0–0.7)
Eosinophils Relative: 8.5 % — ABNORMAL HIGH (ref 0.0–5.0)
HCT: 35.1 % — ABNORMAL LOW (ref 36.0–46.0)
Hemoglobin: 11.8 g/dL — ABNORMAL LOW (ref 12.0–15.0)
Lymphocytes Relative: 35.2 % (ref 12.0–46.0)
Lymphs Abs: 1.9 K/uL (ref 0.7–4.0)
MCHC: 33.5 g/dL (ref 30.0–36.0)
MCV: 86.3 fl (ref 78.0–100.0)
Monocytes Absolute: 0.5 K/uL (ref 0.1–1.0)
Monocytes Relative: 8.8 % (ref 3.0–12.0)
Neutro Abs: 2.5 K/uL (ref 1.4–7.7)
Neutrophils Relative %: 46.3 % (ref 43.0–77.0)
Platelets: 204 K/uL (ref 150.0–400.0)
RBC: 4.07 Mil/uL (ref 3.87–5.11)
RDW: 13 % (ref 11.5–15.5)
WBC: 5.5 K/uL (ref 4.0–10.5)

## 2023-12-05 LAB — LIPID PANEL
Cholesterol: 128 mg/dL (ref 0–200)
HDL: 54.3 mg/dL (ref >39.00)
LDL Cholesterol: 57 mg/dL (ref 0–99)
NonHDL: 73.3
Total CHOL/HDL Ratio: 2
Triglycerides: 80 mg/dL (ref 0.0–149.0)
VLDL: 16 mg/dL (ref 0.0–40.0)

## 2023-12-05 LAB — MICROALBUMIN / CREATININE URINE RATIO
Creatinine,U: 99.6 mg/dL
Microalb Creat Ratio: 7.9 mg/g (ref 0.0–30.0)
Microalb, Ur: 0.8 mg/dL (ref 0.0–1.9)

## 2023-12-05 LAB — HEMOGLOBIN A1C: Hgb A1c MFr Bld: 6.4 % (ref 4.6–6.5)

## 2023-12-05 MED ORDER — POTASSIUM CHLORIDE CRYS ER 20 MEQ PO TBCR: 20.0000 meq | EXTENDED_RELEASE_TABLET | Freq: Three times a day (TID) | ORAL | 0 refills | Status: AC

## 2023-12-05 NOTE — Telephone Encounter (Signed)
 Copied from CRM [id number]. Topic: Clinical - Lab/Test Results >> Dec 05, 2023  4:52 PM Sherri Lee wrote: Reason for CRM: patient is calling requesting a CD copy of images of her left hip and lower back.  Patient has appt tomorrow at 3pm at [facility] and the CD is needed for appt Patient would like a call once CD is ready

## 2023-12-05 NOTE — Telephone Encounter (Signed)
 Your potassium is critically low   I have refilled your potassium supplement but I want you to take one tablet 3 times daily with food for the next 2 days,  then resume one tablet daily   Please schedule a  follow up lab next week

## 2023-12-05 NOTE — Telephone Encounter (Signed)
 Lab appt made. Lab pended for approval

## 2023-12-05 NOTE — Addendum Note (Signed)
 Addended by: MARYLYNN VERNEITA CROME on: 12/05/2023 12:26 PM   Modules accepted: Orders

## 2023-12-05 NOTE — Telephone Encounter (Signed)
 CRITICAL VALUE STICKER  CRITICAL VALUE:  LOW POTASSIUM 2.6  RECEIVER (on-site recipient of call): Eloy Fehl K, RMA  DATE & TIME NOTIFIED: 12/05/23 10:47am  MESSENGER (representative from lab): Ernst   MD NOTIFIED:  Dr. Marylynn   TIME OF NOTIFICATION: 10:50 am

## 2023-12-05 NOTE — Addendum Note (Signed)
 Addended by: Zienna Ahlin on: 12/05/2023 11:53 AM   Modules accepted: Orders

## 2023-12-06 DIAGNOSIS — M5451 Vertebrogenic low back pain: Secondary | ICD-10-CM | POA: Diagnosis not present

## 2023-12-06 DIAGNOSIS — M51362 Other intervertebral disc degeneration, lumbar region with discogenic back pain and lower extremity pain: Secondary | ICD-10-CM | POA: Diagnosis not present

## 2023-12-06 DIAGNOSIS — M5416 Radiculopathy, lumbar region: Secondary | ICD-10-CM | POA: Diagnosis not present

## 2023-12-06 NOTE — Telephone Encounter (Signed)
 Called patient to provide her with Radiology's contact information at [phone]. Patient was informed that if she needs it by 3:30 appointment, that we recommend her calling as soon as we get off the phone. Patient verbalized understanding.

## 2023-12-07 ENCOUNTER — Ambulatory Visit: Admitting: Physical Therapy

## 2023-12-07 ENCOUNTER — Telehealth: Payer: Self-pay | Admitting: Physical Therapy

## 2023-12-07 NOTE — Therapy (Incomplete)
 OUTPATIENT PHYSICAL THERAPY TREATMENT  Patient Name: Sherri Lee MRN: [id number] DOB:[birth year], 68 y.o., female Today's Date: 12/07/2023   PCP: Sherri Verneita CROME, MD REFERRING PROVIDER: Onesimo Claude, MD  END OF SESSION: ***    Past Medical History:  Diagnosis Date   Anemia    Low iron and hemoglobin   Anginal pain    states she thinks it is stress   Anxiety    Asthma    Bronchitis 03/02/2022   Community acquired pneumonia 02/02/2023   COVID-19    07/05/20, 03/2021   Depression    Dysrhythmia    states she has PVC's   Fibromyalgia    Hypertension    Recurrent vomiting 02/04/2018   Schatzki's ring    dialated 3 times by Sherri Lee    Past Surgical History:  Procedure Laterality Date   ABDOMINAL ADHESION SURGERY     BALLOON DILATION  11/15/2022   Procedure: BALLOON DILATION;  Surgeon: Sherri Carmine, MD;  Location: [facility] ENDOSCOPY;  Service: Endoscopy;;   BUNIONECTOMY     left foot    CERVICAL FUSION     CESAREAN SECTION     CHOLECYSTECTOMY  June 2013   COLONOSCOPY WITH PROPOFOL  N/A 11/15/2022   Procedure: COLONOSCOPY WITH PROPOFOL ;  Surgeon: Sherri Carmine, MD;  Location: [facility] ENDOSCOPY;  Service: Endoscopy;  Laterality: N/A;   ESOPHAGOGASTRODUODENOSCOPY (EGD) WITH PROPOFOL  N/A 11/15/2022   Procedure: ESOPHAGOGASTRODUODENOSCOPY (EGD) WITH PROPOFOL ;  Surgeon: Sherri Carmine, MD;  Location: [facility] ENDOSCOPY;  Service: Endoscopy;  Laterality: N/A;   GASTRIC BYPASS     Patient Active Problem List   Diagnosis Date Noted   Dizziness 12/03/2023   Acute midline low back pain without sciatica 11/05/2023   Fall 11/05/2023   Left hip pain 09/27/2023   BPPV (benign paroxysmal positional vertigo) 09/27/2023   Callus of toe 09/27/2023   Vertigo, labyrinthine, unspecified laterality 06/04/2023   Vertigo 05/31/2023   Intermittent constipation 04/14/2023   Stricture of esophagus 11/15/2022   Severe depression (HCC) 12/31/2021   Cervical arthritis 07/30/2021   H/O cervical  spine surgery 07/30/2021   Recurrent headache 02/13/2021   Aortic atherosclerosis 11/12/2020   Diuretic-induced hypokalemia 09/03/2020   History of COVID-19 08/06/2020   Encounter for screening colonoscopy 07/30/2019   Low back pain associated with a spinal disorder other than radiculopathy or spinal stenosis 10/28/2018   Urge incontinence of urine 08/15/2018   Injury of branch of radial nerve 08/15/2018   Skin lesion of right lower extremity 11/04/2017   Adjustment disorder with mixed disturbance of emotions and conduct 03/31/2017   History of benzodiazepine use 03/31/2017   Esophageal stricture 06/19/2016   IBS (irritable bowel syndrome) 09/06/2015   IDA (iron deficiency anemia) 09/06/2015   DDD (degenerative disc disease), cervical 09/24/2014   DDD (degenerative disc disease), thoracic 09/24/2014   Tremor of both hands 09/10/2014   Insomnia due to anxiety and fear 02/26/2013   Alopecia areata 07/11/2012   Cervicalgia 07/11/2012   Degenerative joint disease of thoracic spine 10/04/2011   Essential hypertension 05/10/2011   Prediabetes 05/10/2011   Status post gastric bypass for obesity 05/10/2011   ONSET DATE: February 2025 REFERRING DIAG: Vertigo  THERAPY DIAG: *** No diagnosis found.  Rationale for Evaluation and Treatment: Rehabilitation  SUBJECTIVE:  SUBJECTIVE STATEMENT: ***  Pt needs help with vertigo episodes as she has now fallen twice recently.    PERTINENT HISTORY:   67yoF referred to [facility] for vestibular evaluation after persistent episodic vertigo since acute flu infection in February. Pt reports episodes were initially daily occurrence, but less frequent and less severe- always triggered by movement, often by coming to standing too quickly, duration 4 hours or less. Pt also  reports chronic trouble with her left ear, recently having ear wax buildup removed. Pt has had 2 falls related to vertigo episodes  in the weeks preceding evaluation here, both occurred while getting up in the middle of night for toiletting. Pt reports hitting her head, no significant injury sustained, also aggravating left hip pain, all imaging thus far reassuring. Pt does report spinning sensation on occasion with symptoms, as well as other dizzy type symptoms, denies any graying/blacking of vision or diplopia. PMH: frequent falls >5 years, s/p L1-L3 transverse fractures, ACDF, COPDm iron deficiency anemia, gastric bypass, diuretic-induced hypokalemia, estrogen deficiency.   PAIN:  Are you having pain? No   PRECAUTIONS: None  WEIGHT BEARING RESTRICTIONS: No   FALLS: Has patient fallen in last 6 months? Yes, x2  LIVING ENVIRONMENT: Lives with: Alone Lives in: Condo  Stairs: None at garage entrance  Has following equipment at home: None   PLOF: fully independent, denies any baseline imbalance.   PATIENT GOALS: stop vertigo episodes, avoid additional falls   OBJECTIVE:  Note: Objective measures were completed at evaluation unless otherwise noted.                                                                                                                              TREATMENT DATE 12/07/23:   ***   Supine: 122/76 87bpm 99%  Sitting:  126/82 90bpm  Standing 124/86 97bpm Standing x 2 minutes 126/87 96bpm   -explanation of orthostatic hypotension, symptoms, safe self management, exacerbating factors -Epley Maneuver for Left sided posterior canal BPPV -what's BPPV and what should I expect following treatment? *tests and treatments were modified to accommodate cervical ROM loss 2/2 ACDF history  -gaze stabilization with horiztonal head turns 2x30sec, then 2x30 c nods   ***    PATIENT EDUCATION: Education details: HEP for gaze stability, compliance needs.  Person educated:  Sherri Lee  Education method: discussion Education comprehension: Good  HOME EXERCISE PROGRAM: Access Code: [email or web address] URL: [email or web address] Date: 11/15/2023 Prepared by: Sherri Lee  Exercises - Seated Gaze Stabilization with Head Rotation  - 3 x daily - 7 x weekly - 1 sets - 2 reps - 30sec hold - Seated Gaze Stabilization with Head Nod  - 3 x daily - 7 x weekly - 1 sets - 2 reps - 30sec hold  GOALS: Goals reviewed with patient? No   SHORT TERM GOALS: Target date: 11/24/23  Pt to report no incidence of vertigo symptoms in most recent 7 days.  Baseline: Goal status: INITIAL  2.  Pt  to reports improved ability to safely manage dizziness symptoms associated with coming to standing too quickly.  Baseline:  Goal status: INITIAL  LONG TERM GOALS: Target date: 01/03/24  Pt to report confident safe self-management of mobility in the setting on chronic orthostatic hypotension.  Baseline:  Goal status: INITIAL  2.  Pt to report no falls or close calls at home in the past 4 weeks.  Baseline: 2 falls in past 2 sweeks.  Goal status: INITIAL  3.  Pt to report no vertigo episodes in past 4 weeks.  Baseline: daily.  Goal status: INITIAL  ASSESSMENT:  CLINICAL IMPRESSION: *** Pt returns for 1st visit after Epley Man, showing negative dix hallpike left today; orthostatic vitals also normal today. Pt issued a simple balance HEP for accomodation of vestibular system. Patient will benefit from skilled physical therapy intervention to reduce deficits and impairments identified in evaluation, in order to reduce pain, improve quality of life, and maximize activity tolerance for ADL, IADL, and leisure/fitness. Physical therapy will help pt achieve long and short term goals of care.   OBJECTIVE IMPAIRMENTS: Decreased knowledge of condition, decreased use of DME, decreased mobility, difficulty walking, decreased strength, decreased ROM. ACTIVITY LIMITATIONS: Lifting, standing,  walking, squatting, transfers, locomotion level PARTICIPATION LIMITATIONS: Cleaning, laundry, interpersonal relationships, driving, yardwork, community activity.  PERSONAL FACTORS: Age, behavior pattern, education, past/current experiences, transportation, profession  are also affecting patient's functional outcome.  REHAB POTENTIAL: Good CLINICAL DECISION MAKING: Medium  EVALUATION COMPLEXITY: Moderate   PLAN:  PT FREQUENCY: 1-2x/week  PT DURATION: 8 weeks  PLANNED INTERVENTIONS: 97110-Therapeutic exercises, 97530- Therapeutic activity, 97112- Neuromuscular re-education, 97535- Self Care, 02859- Manual therapy, [phone]- Canalith repositioning, Patient/Family education, Balance training, Stair training, Vestibular training, and Visual/preceptual remediation/compensation  PLAN FOR NEXT SESSION:  *** monitor orthostatic vital signs as symptoms dictate; accomodation training, review balance HEP   Chiquita Silvan, SPT Physical Therapy Student - [place]  [facility]  9:25 AM 12/07/23

## 2023-12-07 NOTE — Telephone Encounter (Signed)
 Therapist called pt regarding missed visit. Patient states she called and left a voicemail stating she has been doing well since her 2 physical therapy appointments at this clinic and didn't feel the need to continue coming. Pt states she is doing well with her HEP provided by this clinic's PT. Pt states she has been having problems walking and went to [facility] yesterday to consult for potential need of L hip replacement. Patient states the long walks into this therapy clinic were also a barrier for her to come to her appointments as well. Therapist made patient aware of ability to have a new referral sent to [facility] to be seen at one of our more accessible facilities if she would like to continue PT. Pt appreciative of the phone call.   Connell Kiss, PT, DPT, NCS, CSRS Physical Therapist - [facility]  [facility]  11:29 AM 12/07/23

## 2023-12-11 MED ORDER — TRAMADOL HCL 50 MG PO TABS: 50.0000 mg | ORAL_TABLET | Freq: Three times a day (TID) | ORAL | 5 refills | Status: DC | PRN

## 2023-12-11 NOTE — Addendum Note (Signed)
 Addended by: ORLANDO KINGDOM on: 12/11/2023 09:49 AM   Modules accepted: Orders

## 2023-12-11 NOTE — Addendum Note (Signed)
 Addended by: MARYLYNN VERNEITA CROME on: 12/11/2023 09:56 AM   Modules accepted: Orders

## 2023-12-14 ENCOUNTER — Other Ambulatory Visit

## 2023-12-14 NOTE — Addendum Note (Signed)
 Addended by: MARYLEN PRO A on: 12/14/2023 12:30 PM   Modules accepted: Orders

## 2023-12-14 NOTE — Addendum Note (Signed)
 Addended by: MARYLEN PRO A on: 12/14/2023 12:29 PM   Modules accepted: Orders

## 2023-12-19 ENCOUNTER — Ambulatory Visit: Admitting: Physical Therapy

## 2023-12-22 ENCOUNTER — Telehealth: Payer: Self-pay

## 2023-12-22 NOTE — Telephone Encounter (Signed)
 Copied from CRM [id number]. Topic: Referral - Request for Referral >> Dec 22, 2023  3:31 PM Suzen RAMAN wrote: Did the patient discuss referral with their provider in the last year? Yes (If No - schedule appointment) (If Yes - send message)  Appointment offered? No  Type of order/referral and detailed reason for visit: Neurologist/ referred by [facility] based on x-ray results. Patient not 100% sure but possible vertigo and back concern  Preference of office, provider, location: within the area, [facility] Dr.Shah  If referral order, have you been seen by this specialty before? Yes, years ago, Dr. Lane (If Yes, this issue or another issue? When? Where?  Can we respond through MyChart? Yes

## 2023-12-25 NOTE — Progress Notes (Signed)
 Sherri Lee                                          MRN: [id number]   12/25/2023   The [facility]I Quality Team Specialist reviewed this patient medical record for the purposes of chart review for care gap closure. The following were reviewed: abstraction for care gap closure-glycemic status assessment and kidney health evaluation for diabetes:eGFR  and uACR.    [facility]BCI Quality Team

## 2023-12-25 NOTE — Telephone Encounter (Signed)
 Spoke with pt to get more information on why the referral to neurology was needed and she stated that she was not sure. Pt gave me permission to call [facility] to see why they requested she be seen by neurology.    Called [facility] and they stated they would send a message back to the provider and have them give us  a call back.

## 2023-12-26 ENCOUNTER — Ambulatory Visit: Admitting: Physical Therapy

## 2023-12-27 NOTE — Telephone Encounter (Signed)
 Spoke with [facility] and they stated that they did not suggest a referral to neurology. The nurse stated that pt has seen Dr. Lane in the past. She stated that she was previously seeing them for spinal issues.   Attempted to call pt to see if she would still like for us  to place a referral to [facility].

## 2023-12-28 ENCOUNTER — Other Ambulatory Visit: Payer: Self-pay | Admitting: Internal Medicine

## 2024-01-01 NOTE — Telephone Encounter (Signed)
 Spoke with pt to let her know of the information we received from [facility]. Pt stated that she would find the message she received from them and give us  a call back about it.

## 2024-01-02 ENCOUNTER — Ambulatory Visit: Admitting: Physical Therapy

## 2024-01-09 ENCOUNTER — Ambulatory Visit: Admitting: Physical Therapy

## 2024-01-15 ENCOUNTER — Encounter: Payer: Self-pay | Admitting: Radiology

## 2024-01-16 ENCOUNTER — Ambulatory Visit: Admitting: Physical Therapy

## 2024-01-19 ENCOUNTER — Encounter: Payer: Self-pay | Admitting: Pharmacist

## 2024-01-19 ENCOUNTER — Telehealth: Admitting: Family Medicine

## 2024-01-19 DIAGNOSIS — J0110 Acute frontal sinusitis, unspecified: Secondary | ICD-10-CM | POA: Diagnosis not present

## 2024-01-19 MED ORDER — AMOXICILLIN-POT CLAVULANATE 875-125 MG PO TABS: 1.0000 | ORAL_TABLET | Freq: Two times a day (BID) | ORAL | 0 refills | Status: AC

## 2024-01-19 MED ORDER — ALBUTEROL SULFATE HFA 108 (90 BASE) MCG/ACT IN AERS: 2.0000 | INHALATION_SPRAY | Freq: Four times a day (QID) | RESPIRATORY_TRACT | 2 refills | Status: AC | PRN

## 2024-01-19 NOTE — Progress Notes (Signed)
 Pharmacy Quality Measure Review  This patient is appearing on a report for being at risk of failing the Kidney Health Evaluation for Patients with Diabetes measure this calendar year.   Insurance report was not up to date.   Last documented UACR: Lab Results  Component Value Date   MICRALBCREAT 7.9 12/04/2023   Measure closed.

## 2024-01-19 NOTE — Progress Notes (Signed)
 MyChart Video Visit    Virtual Visit via Video Note   This format is felt to be most appropriate for this patient at this time. Physical exam was limited by quality of the video and audio technology used for the visit.   Patient location: home Provider location: [facility]  I discussed the limitations of evaluation and management by telemedicine and the availability of in person appointments. The patient expressed understanding and agreed to proceed.  Patient: Sherri Lee   DOB: [birth year]   68 y.o. Female  MRN: [id number] Visit Date: 01/19/2024  Today's healthcare provider: Nancyann Perry, MD    Subjective    HPI  Discussed the use of AI scribe software for clinical note transcription with the patient, who gave verbal consent to proceed.  History of Present Illness   Sherri Lee is a 68 year old female with asthma who presents with symptoms suggestive of a sinus infection.  Her symptoms began approximately one week ago, starting with sinus drainage into her chest and popping in her ears. She has been experiencing a persistent cough and has been taking over-the-counter medications and prednisone  to manage her symptoms. Despite these measures, her condition has not significantly improved.  She has a history of asthma, which tends to exacerbate when she becomes ill. She has been using prednisone  and an inhaler for asthma management. She used to see a pulmonologist regularly but has not needed to as frequently after losing weight.  She reports intermittent low-grade fevers, but no high fever, chills, or sweats. Her primary concern is the drainage from her sinuses into her chest, which has led to difficulty breathing at times. She describes her cough as producing yellowish sputum, which she believes is related to the sinus drainage.  She has previously found Augmentin  to be effective for similar symptoms, as she has had recurrent issues with her sinuses and  asthma in the past. Her albuterol  inhaler is nearing expiration, although her maintenance inhaler is still sufficient.       Medications: Outpatient Medications Prior to Visit  Medication Sig   acyclovir  (ZOVIRAX ) 400 MG tablet Take 1 tablet (400 mg total) by mouth 5 (five) times daily.   amLODipine  (NORVASC ) 5 MG tablet Take 1 tablet (5 mg total) by mouth daily.   atorvastatin  (LIPITOR) 20 MG tablet Take 1 tablet (20 mg total) by mouth daily.   azelastine  (ASTELIN ) 0.1 % nasal spray Place 1 spray into both nostrils 2 (two) times daily. Use in each nostril as directed   calcium  carbonate (OS-CAL) 600 MG TABS Take 600 mg by mouth 2 (two) times daily with a meal.   carbamide peroxide (DEBROX) 6.5 % OTIC solution Place 5 drops into the left ear 2 (two) times daily.   cyclobenzaprine  (FLEXERIL ) 5 MG tablet TAKE 1 TABLET BY MOUTH EVERYDAY AT BEDTIME   diclofenac  Sodium (VOLTAREN ) 1 % GEL Apply 2 g topically 4 (four) times daily.   escitalopram (LEXAPRO) 20 MG tablet Take 20 mg by mouth daily.    estradiol  (ESTRACE ) 0.1 MG/GM vaginal cream Place 1 Applicatorful vaginally 3 (three) times a week.   fluticasone  (FLONASE ) 50 MCG/ACT nasal spray Place 2 sprays into both nostrils daily as needed for allergies or rhinitis. After nasal saline   furosemide  (LASIX ) 20 MG tablet TAKE 1 TABLET (20 MG TOTAL) BY MOUTH AS NEEDED FOR EDEMA.   gabapentin  (NEURONTIN ) 100 MG capsule TAKE 1 CAPSULE (100 MG TOTAL) BY MOUTH 3 TIMES A DAY   latanoprost (  XALATAN) 0.005 % ophthalmic solution 1 drop at bedtime.   LORazepam  (ATIVAN ) 1 MG tablet TAKE 1 TABLET BY MOUTH TWICE A DAY AS DIRECTED FOR ANXIETY/PANIC   metoprolol  succinate (TOPROL -XL) 50 MG 24 hr tablet Take with or immediately following a meal.TAKE 0.5 TABLETS (25 MG TOTAL) BY MOUTH DAILY. TAKE WITH OR IMMEDIATELY FOLLOWING A MEAL.   Multiple Vitamin (MULTIVITAMIN) tablet Take 1 tablet by mouth daily. Bariatric Vitamin   oxybutynin  (DITROPAN -XL) 10 MG 24 hr tablet  TAKE 1 TABLET BY MOUTH EVERYDAY AT BEDTIME   pantoprazole  (PROTONIX ) 40 MG tablet TAKE 1 TABLET BY MOUTH EVERY DAY   potassium chloride  SA (KLOR-CON  M) 20 MEQ tablet Take 1 tablet (20 mEq total) by mouth 3 (three) times daily. With meals for 2 days ,  then resume once daily thereafter   saccharomyces boulardii (FLORASTOR) 250 MG capsule Take 1 capsule (250 mg total) by mouth daily.   traMADol  (ULTRAM ) 50 MG tablet Take 1 tablet (50 mg total) by mouth every 8 (eight) hours as needed.   VITAMIN D  PO Take by mouth.   zolpidem (AMBIEN) 10 MG tablet Take 10 mg by mouth at bedtime as needed for sleep.   [DISCONTINUED] albuterol  (VENTOLIN  HFA) 108 (90 Base) MCG/ACT inhaler Inhale 2 puffs into the lungs every 6 (six) hours as needed for wheezing or shortness of breath.   No facility-administered medications prior to visit.      Objective    There were no vitals taken for this visit.   Physical Exam  Awake, alert, oriented x 3. In no apparent distress   Assessment & Plan        Acute frontal sinusitis with productive cough Symptoms consistent with sinusitis and chest involvement. Negative for COVID, flu A, and flu B. Low-grade fever noted. Augmentin  chosen for treatment based on past efficacy. - Prescribed Augmentin  and sent prescription to [facility] on [address].  Asthma Exacerbation likely due to sinusitis. Managed with prednisone  and inhalers. - Refilled albuterol  inhaler prescription.     Call if symptoms change or if not rapidly improving.        I discussed the assessment and treatment plan with the patient. The patient was provided an opportunity to ask questions and all were answered. The patient agreed with the plan and demonstrated an understanding of the instructions.   The patient was advised to call back or seek an in-person evaluation if the symptoms worsen or if the condition fails to improve as anticipated.  I provided 11 minutes of non-face-to-face time during this  encounter.   Nancyann Perry, MD [facility] [phone] (phone) [phone] (fax)  [facility]

## 2024-01-24 ENCOUNTER — Ambulatory Visit

## 2024-01-31 ENCOUNTER — Ambulatory Visit

## 2024-03-12 DIAGNOSIS — M5134 Other intervertebral disc degeneration, thoracic region: Secondary | ICD-10-CM

## 2024-03-12 DIAGNOSIS — M5030 Other cervical disc degeneration, unspecified cervical region: Secondary | ICD-10-CM

## 2024-03-12 NOTE — Telephone Encounter (Unsigned)
 Copied from CRM [id number]. Topic: Clinical - Medication Refill >> Mar 12, 2024  4:11 PM Drema MATSU wrote: Medication: traMADol  (ULTRAM ) 50 MG tablet  Has the patient contacted their pharmacy? Yes (Agent: If no, request that the patient contact the pharmacy for the refill. If patient does not wish to contact the pharmacy document the reason why and proceed with request.) pharmacy states that she doesn't have any refills but her bottle says 3. States it is refillable today 12/30 (Agent: If yes, when and what did the pharmacy advise?)  This is the patient's preferred pharmacy:  [id number] [address] [facility] - [address] [place] [address] Phone: [phone] Fax: [phone]  Is this the correct pharmacy for this prescription? Yes If no, delete pharmacy and type the correct one.   Has the prescription been filled recently? Yes  Is the patient out of the medication? Yes out as of today  Has the patient been seen for an appointment in the last year OR does the patient have an upcoming appointment? Yes  Can we respond through MyChart? Yes  Agent: Please be advised that Rx refills may take up to 3 business days. We ask that you follow-up with your pharmacy.

## 2024-03-13 MED ORDER — TRAMADOL HCL 50 MG PO TABS: 50.0000 mg | ORAL_TABLET | Freq: Three times a day (TID) | ORAL | 2 refills | Status: AC | PRN

## 2024-05-01 ENCOUNTER — Ambulatory Visit: Payer: Medicare Other
